# Patient Record
Sex: Female | Born: 1950
Health system: Southern US, Community
[De-identification: ages and names within clinical notes are randomized; demographics above are authoritative.]

## PROBLEM LIST (undated history)

## (undated) DIAGNOSIS — J45909 Unspecified asthma, uncomplicated: Secondary | ICD-10-CM

## (undated) DIAGNOSIS — F1910 Other psychoactive substance abuse, uncomplicated: Secondary | ICD-10-CM

## (undated) DIAGNOSIS — K589 Irritable bowel syndrome without diarrhea: Secondary | ICD-10-CM

## (undated) DIAGNOSIS — I10 Essential (primary) hypertension: Secondary | ICD-10-CM

## (undated) DIAGNOSIS — H269 Unspecified cataract: Secondary | ICD-10-CM

## (undated) DIAGNOSIS — E785 Hyperlipidemia, unspecified: Secondary | ICD-10-CM

## (undated) DIAGNOSIS — M1990 Unspecified osteoarthritis, unspecified site: Secondary | ICD-10-CM

## (undated) DIAGNOSIS — J45991 Cough variant asthma: Secondary | ICD-10-CM

## (undated) DIAGNOSIS — K219 Gastro-esophageal reflux disease without esophagitis: Secondary | ICD-10-CM

## (undated) DIAGNOSIS — F419 Anxiety disorder, unspecified: Secondary | ICD-10-CM

## (undated) DIAGNOSIS — F329 Major depressive disorder, single episode, unspecified: Secondary | ICD-10-CM

## (undated) DIAGNOSIS — F32A Depression, unspecified: Secondary | ICD-10-CM

## (undated) DIAGNOSIS — K449 Diaphragmatic hernia without obstruction or gangrene: Secondary | ICD-10-CM

## (undated) DIAGNOSIS — R011 Cardiac murmur, unspecified: Secondary | ICD-10-CM

## (undated) DIAGNOSIS — C801 Malignant (primary) neoplasm, unspecified: Secondary | ICD-10-CM

## (undated) DIAGNOSIS — T7840XA Allergy, unspecified, initial encounter: Secondary | ICD-10-CM

## (undated) HISTORY — DX: Other psychoactive substance abuse, uncomplicated: F19.10

## (undated) HISTORY — PX: TUBAL LIGATION: SHX77

## (undated) HISTORY — DX: Depression, unspecified: F32.A

## (undated) HISTORY — DX: Diaphragmatic hernia without obstruction or gangrene: K44.9

## (undated) HISTORY — DX: Major depressive disorder, single episode, unspecified: F32.9

## (undated) HISTORY — DX: Anxiety disorder, unspecified: F41.9

## (undated) HISTORY — DX: Unspecified asthma, uncomplicated: J45.909

## (undated) HISTORY — PX: ANKLE SURGERY: SHX546

## (undated) HISTORY — DX: Unspecified osteoarthritis, unspecified site: M19.90

## (undated) HISTORY — DX: Cough variant asthma: J45.991

## (undated) HISTORY — DX: Gastro-esophageal reflux disease without esophagitis: K21.9

## (undated) HISTORY — DX: Irritable bowel syndrome, unspecified: K58.9

## (undated) HISTORY — PX: CATARACT EXTRACTION: SUR2

## (undated) HISTORY — DX: Hyperlipidemia, unspecified: E78.5

## (undated) HISTORY — DX: Unspecified cataract: H26.9

## (undated) HISTORY — DX: Essential (primary) hypertension: I10

## (undated) HISTORY — DX: Malignant (primary) neoplasm, unspecified: C80.1

## (undated) HISTORY — DX: Cardiac murmur, unspecified: R01.1

## (undated) HISTORY — DX: Allergy, unspecified, initial encounter: T78.40XA

---

## 1997-07-30 ENCOUNTER — Other Ambulatory Visit: Admission: RE | Admit: 1997-07-30 | Discharge: 1997-07-30 | Payer: Self-pay | Admitting: Obstetrics and Gynecology

## 1997-12-26 ENCOUNTER — Ambulatory Visit (HOSPITAL_COMMUNITY): Admission: RE | Admit: 1997-12-26 | Discharge: 1997-12-26 | Payer: Self-pay | Admitting: Obstetrics and Gynecology

## 1998-06-07 ENCOUNTER — Other Ambulatory Visit: Admission: RE | Admit: 1998-06-07 | Discharge: 1998-06-07 | Payer: Self-pay | Admitting: Internal Medicine

## 1998-06-13 ENCOUNTER — Encounter: Payer: Self-pay | Admitting: Internal Medicine

## 1998-06-13 ENCOUNTER — Ambulatory Visit (HOSPITAL_COMMUNITY): Admission: RE | Admit: 1998-06-13 | Discharge: 1998-06-13 | Payer: Self-pay | Admitting: Internal Medicine

## 1998-06-26 ENCOUNTER — Encounter: Payer: Self-pay | Admitting: Internal Medicine

## 1998-06-26 ENCOUNTER — Ambulatory Visit (HOSPITAL_COMMUNITY): Admission: RE | Admit: 1998-06-26 | Discharge: 1998-06-26 | Payer: Self-pay | Admitting: Internal Medicine

## 1999-06-02 ENCOUNTER — Encounter: Payer: Self-pay | Admitting: Internal Medicine

## 1999-06-02 ENCOUNTER — Encounter: Admission: RE | Admit: 1999-06-02 | Discharge: 1999-06-02 | Payer: Self-pay | Admitting: Internal Medicine

## 1999-06-09 ENCOUNTER — Inpatient Hospital Stay (HOSPITAL_COMMUNITY): Admission: AD | Admit: 1999-06-09 | Discharge: 1999-06-12 | Payer: Self-pay | Admitting: *Deleted

## 1999-06-13 ENCOUNTER — Other Ambulatory Visit (HOSPITAL_COMMUNITY): Admission: RE | Admit: 1999-06-13 | Discharge: 1999-06-30 | Payer: Self-pay | Admitting: Psychiatry

## 2000-03-15 ENCOUNTER — Ambulatory Visit (HOSPITAL_COMMUNITY): Admission: RE | Admit: 2000-03-15 | Discharge: 2000-03-15 | Payer: Self-pay

## 2000-03-15 ENCOUNTER — Encounter: Payer: Self-pay | Admitting: Internal Medicine

## 2000-06-14 ENCOUNTER — Other Ambulatory Visit: Admission: RE | Admit: 2000-06-14 | Discharge: 2000-06-14 | Payer: Self-pay | Admitting: Internal Medicine

## 2001-03-28 ENCOUNTER — Ambulatory Visit (HOSPITAL_COMMUNITY): Admission: RE | Admit: 2001-03-28 | Discharge: 2001-03-28 | Payer: Self-pay | Admitting: Internal Medicine

## 2001-03-28 ENCOUNTER — Encounter: Payer: Self-pay | Admitting: Internal Medicine

## 2001-04-04 ENCOUNTER — Encounter: Payer: Self-pay | Admitting: Internal Medicine

## 2001-04-04 ENCOUNTER — Encounter: Admission: RE | Admit: 2001-04-04 | Discharge: 2001-04-04 | Payer: Self-pay | Admitting: Internal Medicine

## 2001-06-20 ENCOUNTER — Other Ambulatory Visit: Admission: RE | Admit: 2001-06-20 | Discharge: 2001-06-20 | Payer: Self-pay | Admitting: Internal Medicine

## 2001-09-17 ENCOUNTER — Ambulatory Visit (HOSPITAL_COMMUNITY): Admission: RE | Admit: 2001-09-17 | Discharge: 2001-09-17 | Payer: Self-pay | Admitting: Orthopedic Surgery

## 2001-09-17 ENCOUNTER — Encounter: Payer: Self-pay | Admitting: Orthopedic Surgery

## 2002-05-05 ENCOUNTER — Ambulatory Visit (HOSPITAL_COMMUNITY): Admission: RE | Admit: 2002-05-05 | Discharge: 2002-05-05 | Payer: Self-pay | Admitting: Internal Medicine

## 2002-05-05 ENCOUNTER — Encounter: Payer: Self-pay | Admitting: Internal Medicine

## 2002-07-03 ENCOUNTER — Other Ambulatory Visit: Admission: RE | Admit: 2002-07-03 | Discharge: 2002-07-03 | Payer: Self-pay | Admitting: Internal Medicine

## 2004-04-01 ENCOUNTER — Ambulatory Visit (HOSPITAL_BASED_OUTPATIENT_CLINIC_OR_DEPARTMENT_OTHER): Admission: RE | Admit: 2004-04-01 | Discharge: 2004-04-01 | Payer: Self-pay | Admitting: Orthopedic Surgery

## 2004-07-28 ENCOUNTER — Other Ambulatory Visit: Admission: RE | Admit: 2004-07-28 | Discharge: 2004-07-28 | Payer: Self-pay | Admitting: Internal Medicine

## 2004-10-22 ENCOUNTER — Ambulatory Visit: Payer: Self-pay | Admitting: Gastroenterology

## 2004-11-14 ENCOUNTER — Ambulatory Visit: Payer: Self-pay | Admitting: Gastroenterology

## 2004-11-19 ENCOUNTER — Encounter (INDEPENDENT_AMBULATORY_CARE_PROVIDER_SITE_OTHER): Payer: Self-pay | Admitting: *Deleted

## 2004-11-19 ENCOUNTER — Ambulatory Visit: Payer: Self-pay | Admitting: Gastroenterology

## 2004-12-08 ENCOUNTER — Encounter: Admission: RE | Admit: 2004-12-08 | Discharge: 2005-03-08 | Payer: Self-pay | Admitting: Orthopedic Surgery

## 2005-01-16 ENCOUNTER — Ambulatory Visit: Payer: Self-pay | Admitting: Gastroenterology

## 2005-08-04 ENCOUNTER — Other Ambulatory Visit: Admission: RE | Admit: 2005-08-04 | Discharge: 2005-08-04 | Payer: Self-pay | Admitting: Internal Medicine

## 2006-07-15 ENCOUNTER — Other Ambulatory Visit: Admission: RE | Admit: 2006-07-15 | Discharge: 2006-07-15 | Payer: Self-pay | Admitting: Internal Medicine

## 2007-06-24 ENCOUNTER — Other Ambulatory Visit: Admission: RE | Admit: 2007-06-24 | Discharge: 2007-06-24 | Payer: Self-pay | Admitting: Internal Medicine

## 2007-11-13 ENCOUNTER — Emergency Department (HOSPITAL_BASED_OUTPATIENT_CLINIC_OR_DEPARTMENT_OTHER): Admission: EM | Admit: 2007-11-13 | Discharge: 2007-11-13 | Payer: Self-pay | Admitting: Emergency Medicine

## 2008-03-02 ENCOUNTER — Ambulatory Visit: Payer: Self-pay | Admitting: Internal Medicine

## 2008-03-05 ENCOUNTER — Ambulatory Visit: Payer: Self-pay | Admitting: Internal Medicine

## 2008-03-16 ENCOUNTER — Emergency Department (HOSPITAL_BASED_OUTPATIENT_CLINIC_OR_DEPARTMENT_OTHER): Admission: EM | Admit: 2008-03-16 | Discharge: 2008-03-16 | Payer: Self-pay | Admitting: Emergency Medicine

## 2008-05-22 ENCOUNTER — Ambulatory Visit: Payer: Self-pay | Admitting: Internal Medicine

## 2008-08-28 ENCOUNTER — Encounter: Payer: Self-pay | Admitting: Internal Medicine

## 2008-08-28 ENCOUNTER — Ambulatory Visit: Payer: Self-pay | Admitting: Internal Medicine

## 2008-08-28 ENCOUNTER — Other Ambulatory Visit: Admission: RE | Admit: 2008-08-28 | Discharge: 2008-08-28 | Payer: Self-pay | Admitting: Internal Medicine

## 2008-10-23 DIAGNOSIS — D0920 Carcinoma in situ of unspecified eye: Secondary | ICD-10-CM

## 2008-10-23 HISTORY — DX: Carcinoma in situ of unspecified eye: D09.20

## 2008-11-22 ENCOUNTER — Ambulatory Visit: Payer: Self-pay | Admitting: Internal Medicine

## 2008-11-22 ENCOUNTER — Encounter: Payer: Self-pay | Admitting: Internal Medicine

## 2008-12-10 ENCOUNTER — Ambulatory Visit: Payer: Self-pay | Admitting: Internal Medicine

## 2008-12-24 ENCOUNTER — Telehealth (INDEPENDENT_AMBULATORY_CARE_PROVIDER_SITE_OTHER): Payer: Self-pay | Admitting: *Deleted

## 2008-12-25 ENCOUNTER — Ambulatory Visit: Payer: Self-pay

## 2008-12-25 ENCOUNTER — Encounter: Payer: Self-pay | Admitting: Cardiovascular Disease

## 2009-01-08 ENCOUNTER — Ambulatory Visit: Payer: Self-pay | Admitting: Sports Medicine

## 2009-01-08 DIAGNOSIS — M7660 Achilles tendinitis, unspecified leg: Secondary | ICD-10-CM

## 2009-01-21 ENCOUNTER — Ambulatory Visit: Payer: Self-pay | Admitting: Internal Medicine

## 2009-03-05 ENCOUNTER — Ambulatory Visit: Payer: Self-pay | Admitting: Internal Medicine

## 2009-03-19 ENCOUNTER — Telehealth: Payer: Self-pay | Admitting: Cardiovascular Disease

## 2009-03-26 ENCOUNTER — Ambulatory Visit: Payer: Self-pay | Admitting: Internal Medicine

## 2009-04-19 ENCOUNTER — Encounter: Payer: Self-pay | Admitting: Cardiovascular Disease

## 2009-04-19 ENCOUNTER — Ambulatory Visit (HOSPITAL_BASED_OUTPATIENT_CLINIC_OR_DEPARTMENT_OTHER): Admission: RE | Admit: 2009-04-19 | Discharge: 2009-04-19 | Payer: Self-pay | Admitting: Internal Medicine

## 2009-05-15 ENCOUNTER — Ambulatory Visit: Payer: Self-pay | Admitting: Cardiovascular Disease

## 2009-10-22 ENCOUNTER — Ambulatory Visit: Payer: Self-pay | Admitting: Sports Medicine

## 2009-10-22 DIAGNOSIS — M7750 Other enthesopathy of unspecified foot: Secondary | ICD-10-CM

## 2009-11-08 ENCOUNTER — Ambulatory Visit: Payer: Self-pay | Admitting: Internal Medicine

## 2009-11-10 ENCOUNTER — Emergency Department (HOSPITAL_BASED_OUTPATIENT_CLINIC_OR_DEPARTMENT_OTHER): Admission: EM | Admit: 2009-11-10 | Discharge: 2009-11-10 | Payer: Self-pay | Admitting: Emergency Medicine

## 2009-11-10 ENCOUNTER — Ambulatory Visit: Payer: Self-pay | Admitting: Diagnostic Radiology

## 2009-11-13 ENCOUNTER — Ambulatory Visit (HOSPITAL_BASED_OUTPATIENT_CLINIC_OR_DEPARTMENT_OTHER): Admission: RE | Admit: 2009-11-13 | Discharge: 2009-11-13 | Payer: Self-pay | Admitting: Emergency Medicine

## 2009-11-13 ENCOUNTER — Ambulatory Visit: Payer: Self-pay | Admitting: Radiology

## 2009-11-14 ENCOUNTER — Encounter (INDEPENDENT_AMBULATORY_CARE_PROVIDER_SITE_OTHER): Payer: Self-pay

## 2009-11-19 ENCOUNTER — Ambulatory Visit: Payer: Self-pay | Admitting: Gastroenterology

## 2009-11-21 ENCOUNTER — Telehealth: Payer: Self-pay | Admitting: Gastroenterology

## 2009-11-25 ENCOUNTER — Ambulatory Visit: Payer: Self-pay | Admitting: Internal Medicine

## 2009-12-10 ENCOUNTER — Ambulatory Visit: Payer: Self-pay | Admitting: Family Medicine

## 2009-12-10 ENCOUNTER — Ambulatory Visit (HOSPITAL_BASED_OUTPATIENT_CLINIC_OR_DEPARTMENT_OTHER): Admission: RE | Admit: 2009-12-10 | Discharge: 2009-12-10 | Payer: Self-pay | Admitting: Family Medicine

## 2009-12-10 ENCOUNTER — Ambulatory Visit: Payer: Self-pay | Admitting: Diagnostic Radiology

## 2009-12-13 ENCOUNTER — Encounter: Admission: RE | Admit: 2009-12-13 | Discharge: 2009-12-13 | Payer: Self-pay | Admitting: Internal Medicine

## 2009-12-26 ENCOUNTER — Ambulatory Visit: Payer: Self-pay | Admitting: Family Medicine

## 2009-12-30 ENCOUNTER — Ambulatory Visit: Payer: Self-pay | Admitting: Internal Medicine

## 2010-01-21 ENCOUNTER — Ambulatory Visit: Payer: Self-pay | Admitting: Internal Medicine

## 2010-02-05 ENCOUNTER — Telehealth: Payer: Self-pay | Admitting: Gastroenterology

## 2010-02-06 ENCOUNTER — Encounter (INDEPENDENT_AMBULATORY_CARE_PROVIDER_SITE_OTHER): Payer: Self-pay | Admitting: *Deleted

## 2010-02-19 ENCOUNTER — Ambulatory Visit: Payer: Self-pay | Admitting: Gastroenterology

## 2010-02-19 ENCOUNTER — Telehealth: Payer: Self-pay | Admitting: Gastroenterology

## 2010-02-19 ENCOUNTER — Encounter (INDEPENDENT_AMBULATORY_CARE_PROVIDER_SITE_OTHER): Payer: Self-pay | Admitting: *Deleted

## 2010-02-21 ENCOUNTER — Ambulatory Visit: Payer: Self-pay | Admitting: Internal Medicine

## 2010-03-05 ENCOUNTER — Ambulatory Visit: Payer: Self-pay | Admitting: Gastroenterology

## 2010-03-09 ENCOUNTER — Encounter: Payer: Self-pay | Admitting: Gastroenterology

## 2010-03-14 ENCOUNTER — Ambulatory Visit: Payer: Self-pay | Admitting: Internal Medicine

## 2010-05-05 ENCOUNTER — Emergency Department (HOSPITAL_BASED_OUTPATIENT_CLINIC_OR_DEPARTMENT_OTHER)
Admission: EM | Admit: 2010-05-05 | Discharge: 2010-05-05 | Payer: Self-pay | Source: Home / Self Care | Admitting: Emergency Medicine

## 2010-06-12 NOTE — Letter (Signed)
Summary: [facility] Instructions  [facility]  [address] [place], [place] [address]   Phone: [phone]  Fax: [phone]       Donna [name]    05/27/1950    MRN: [id number]        Procedure Day Dorna Bloom:  Wednesday 12/04/2009     Arrival Time: 8:00 am      Procedure Time: 9:00 am     Location of Procedure:                    _ x_  [facility] (4th Floor)                        PREPARATION FOR COLONOSCOPY WITH MOVIPREP   Starting 5 days prior to your procedure Friday 7/22 do not eat nuts, seeds, popcorn, corn, beans, peas,  salads, or any raw vegetables.  Do not take any fiber supplements (e.g. Metamucil, Citrucel, and Benefiber).  THE DAY BEFORE YOUR PROCEDURE         DATE: Tuesday 7/26 1.  Drink clear liquids the entire day-NO SOLID FOOD  2.  Do not drink anything colored red or purple.  Avoid juices with pulp.  No orange juice.  3.  Drink at least 64 oz. (8 glasses) of fluid/clear liquids during the day to prevent dehydration and help the prep work efficiently.  CLEAR LIQUIDS INCLUDE: Water Jello Ice Popsicles Tea (sugar ok, no milk/cream) Powdered fruit flavored drinks Coffee (sugar ok, no milk/cream) Gatorade Juice: apple, white grape, white cranberry  Lemonade Clear bullion, consomm, broth Carbonated beverages (any kind) Strained chicken noodle soup Hard Candy                             4.  In the morning, mix first dose of MoviPrep solution:    Empty 1 Pouch A and 1 Pouch B into the disposable container    Add lukewarm drinking water to the top line of the container. Mix to dissolve    Refrigerate (mixed solution should be used within 24 hrs)  5.  Begin drinking the prep at 5:00 p.m. The MoviPrep container is divided by 4 marks.   Every 15 minutes drink the solution down to the next mark (approximately 8 oz) until the full liter is complete.   6.  Follow completed prep with 16 oz of clear liquid of your choice (Nothing  red or purple).  Continue to drink clear liquids until bedtime.  7.  Before going to bed, mix second dose of MoviPrep solution:    Empty 1 Pouch A and 1 Pouch B into the disposable container    Add lukewarm drinking water to the top line of the container. Mix to dissolve    Refrigerate  THE DAY OF YOUR PROCEDURE      DATE: Wednesday 7/27  Beginning at 4:00 a.m. (5 hours before procedure):         1. Every 15 minutes, drink the solution down to the next mark (approx 8 oz) until the full liter is complete.  2. Follow completed prep with 16 oz. of clear liquid of your choice.    3. You may drink clear liquids until 7:00 am (2 HOURS BEFORE PROCEDURE).   MEDICATION INSTRUCTIONS  Unless otherwise instructed, you should take regular prescription medications with a small sip of water   as early as possible the morning of your  procedure.         OTHER INSTRUCTIONS  You will need a responsible adult at least 59 years of age to accompany you and drive you home.   This person must remain in the waiting room during your procedure.  Wear loose fitting clothing that is easily removed.  Leave jewelry and other valuables at home.  However, you may wish to bring a book to read or  an iPod/MP3 player to listen to music as you wait for your procedure to start.  Remove all body piercing jewelry and leave at home.  Total time from sign-in until discharge is approximately 2-3 hours.  You should go home directly after your procedure and rest.  You can resume normal activities the  day after your procedure.  The day of your procedure you should not:   Drive   Make legal decisions   Operate machinery   Drink alcohol   Return to work  You will receive specific instructions about eating, activities and medications before you leave.    The above instructions have been reviewed and explained to me by   Ulis Rias RN  November 19, 2009 2:18 PM     I fully understand and can  verbalize these instructions _____________________________ Date _________

## 2010-06-12 NOTE — Progress Notes (Signed)
Summary: Nuclear Pre-Procedure  Phone Note Outgoing Call Call back at [facility] Phone [phone]   Call placed by: Stanton Kidney, EMT-P,  December 24, 2008 12:44 PM Action Taken: Phone Call Completed Summary of Call: Left message with information on Myoview Information Sheet (see scanned document for details).     Nuclear Med Background Indications for Stress Test: Evaluation for Ischemia   History: Myocardial Perfusion Study  History Comments: '02 MPS: (-) ischemia, EF=58%  Symptoms: Chest Pain with Exertion, Chest Tightness with Exertion, DOE    Nuclear Pre-Procedure Cardiac Risk Factors: Family History - CAD, History of Smoking, Lipids Height (in): 67

## 2010-06-12 NOTE — Letter (Signed)
Summary: Patient Notice- Polyp Results   Gastroenterology  [address] [place], [place] [address]   Phone: [phone]  Fax: [phone]        March 09, 2010 MRN: [id number]    [facility] [address] [place], [place]  [address]    Dear Ms. Donna [name],  I am pleased to inform you that the colon polyp(s) removed during your recent colonoscopy was (were) found to be benign (no cancer detected) upon pathologic examination.  I recommend you have a repeat colonoscopy examination in 5 years to look for recurrent polyps, as having colon polyps increases your risk for having recurrent polyps or even colon cancer in the future.  The gastric biopsies were normal.  Should you develop new or worsening symptoms of abdominal pain, bowel habit changes or bleeding from the rectum or bowels, please schedule an evaluation with either your primary care physician or with me.  Continue treatment plan as outlined the day of your exam.  Please call us if you are having persistent problems or have questions about your condition that have not been fully answered at this time.  Sincerely,  Meryl Dare MD [facility]  This letter has been electronically signed by your physician.  Appended Document: Patient Notice- Polyp Results letter mailed

## 2010-06-12 NOTE — Miscellaneous (Signed)
Summary: [facility] previsit  Clinical Lists Changes  Medications: Added new medication of MOVIPREP 100 GM  SOLR (PEG-KCL-NACL-NASULF-NA ASC-C) As per prep instructions. - Signed Rx of MOVIPREP 100 GM  SOLR (PEG-KCL-NACL-NASULF-NA ASC-C) As per prep instructions.;  #1 x 0;  Signed;  Entered by: Ulis Rias RN;  Authorized by: Meryl Dare MD [facility];  Method used: Faxed to [id number], [address] [place], [place]  [address], Ph: [phone], Fax: [phone]    Prescriptions: MOVIPREP 100 GM  SOLR (PEG-KCL-NACL-NASULF-NA ASC-C) As per prep instructions.  #1 x 0   Entered by:   Ulis Rias RN   Authorized by:   Meryl Dare MD [facility]   Signed by:   Ulis Rias RN on 11/19/2009   Method used:   Faxed to ...       [id number] (retail)       [address]       [place], [place]  [address]       Ph: [phone]       Fax: [phone]   RxID:   [id number]

## 2010-06-12 NOTE — Miscellaneous (Signed)
Summary: [facility] Previsit/prep  Clinical Lists Changes  Medications: Added new medication of DULCOLAX 5 MG  TBEC (BISACODYL) Day before procedure take 2 at 3pm and 2 at 8pm. - Signed Added new medication of METOCLOPRAMIDE HCL 10 MG  TABS (METOCLOPRAMIDE HCL) As per prep instructions. - Signed Added new medication of MIRALAX   POWD (POLYETHYLENE GLYCOL 3350) As per prep  instructions. - Signed Rx of DULCOLAX 5 MG  TBEC (BISACODYL) Day before procedure take 2 at 3pm and 2 at 8pm.;  #4 x 0;  Signed;  Entered by: Wyona Almas RN;  Authorized by: Meryl Dare MD [facility];  Method used: Electronically to [facility]*, [address] Shipping/mailing, [place], [place]  [address], Ph: [phone], Fax: [phone] Rx of METOCLOPRAMIDE HCL 10 MG  TABS (METOCLOPRAMIDE HCL) As per prep instructions.;  #2 x 0;  Signed;  Entered by: Wyona Almas RN;  Authorized by: Meryl Dare MD [facility];  Method used: Electronically to [facility]*, [address], [address] Shipping/mailing, [place], [place]  [address], Ph: [phone], Fax: [phone] Rx of MIRALAX   POWD (POLYETHYLENE GLYCOL 3350) As per prep  instructions.;  #255gm x 0;  Signed;  Entered by: Wyona Almas RN;  Authorized by: Meryl Dare MD [facility];  Method used: Electronically to [facility]*, [address], [address] Shipping/mailing, [place], [place]  [address], Ph: [phone], Fax: [phone] Allergies: Added new allergy or adverse reaction of * MOVI PREP    Prescriptions: MIRALAX   POWD (POLYETHYLENE GLYCOL 3350) As per prep  instructions.  #255gm x 0   Entered by:   Wyona Almas RN   Authorized by:   Meryl Dare MD [facility]   Signed by:   Wyona Almas RN on 02/19/2010   Method used:   Electronically to        [facility]* (retail)       [address]       [address] [place] [address]mailing       [place], [place]  [address]       Ph:  [phone]       Fax: [phone]   RxID:   [id number] METOCLOPRAMIDE HCL 10 MG  TABS (METOCLOPRAMIDE HCL) As per prep instructions.  #2 x 0   Entered by:   Wyona Almas RN   Authorized by:   Meryl Dare MD [facility]   Signed by:   Wyona Almas RN on 02/19/2010   Method used:   Electronically to        [facility]* (retail)       [address]       [address] [place] [address]       [place], [place]  [address]       Ph: [phone]       Fax: [phone]   RxID:   [id number] DULCOLAX 5 MG  TBEC (BISACODYL) Day before procedure take 2 at 3pm and 2 at 8pm.  #4 x 0   Entered by:   Wyona Almas RN   Authorized by:   Meryl Dare MD [facility]   Signed by:   Wyona Almas RN on 02/19/2010   Method used:   Electronically to        [facility]* (retail)       [address]       [address]  [address]/mail[address]       [place], [place]  [address]       Ph: [phone]       Fax: [phone]   RxID:   [id number]

## 2010-06-12 NOTE — Assessment & Plan Note (Signed)
Summary: EC3/ PREVIOUS PT OF DR. ROSS/ GD   Visit Type:  Initial Consult Primary Provider:  Eden Emms [name]  CC:  occ sob and chest pain with exer.Marland Kitchen  History of Present Illness: Donna [name] is seen today for the first time by me.  She has seen Dr Tenny Craw in the past for SSCP.  She has a normal myovue and echo last year.  Her CRF's are limited.  Her LDL on 08/2008 was 118.  She works out at [employer] in [place] 4x/week.  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  She has some exertional dyspnea which sounds functional  There is no cough, sputum or edema.  She is a nonsmoker.  We discussed the possibility of a calcium score in the future if she wanted to further define her risk of CAD.  Her primary care doctor is [name].  I told her since her tests were normal and she is now asymptomatic that she could see me as needed  I reviewed her sleep study with her.  She had no periods of prolonged apnea and never dropped her sats below 90.    I take care of her husband Donna Maduro which is why she F/U with me and not Dr Tenny Craw.  Current Problems (verified): 1)  Achilles Tendinitis  (ICD-726.71) 2)  Chest Pain  (ICD-786.50)  Current Medications (verified): 1)  Multivitamins  Caps (Multiple Vitamin) .... Take 1 Capsule By Mouth Once A Day 2)  Calcium Gluconate 500 Mg Tabs (Calcium Gluconate) .... Take 1 Tablet By Mouth Once A Day 3)  Vitamin D 3 5,000 Iu Tablet .... Take 1 Tablet By Mouth Once A Day 4)  Magnesium 500 Mg Tabs (Magnesium) .... Take 1 Tablet By Mouth Once A Day 5)  B Complex  Tabs (B Complex Vitamins) .... Take 1 Tablet By Mouth Once A Day 6)  Resveratral 125mg  Cap .... Take 1 Capsule By Mouth Once A Day 7)  Omega-3 Fish Oil 1200 Mg Caps (Omega-3 Fatty Acids) .... Take 1 Capsule By Mouth Once A Day 8)  Flax Seed Oil 1000 Mg Caps (Flaxseed (Linseed)) .... Take 1 Capsule By Mouth Once A Day 9)  Zyflamend Capsule .... Take 1 Capsule By Mouth Once A Day 10)  Nitroglycerin 0.4 Mg  Subl (Nitroglycerin) .... One Tablet Under Tongue Every 5 Minutes As Needed For Chest Pain---May Repeat Times Three 11)  Aspirin 81 Mg Tbec (Aspirin) .... Take One Tablet By Mouth Daily 12)  Voltaren 1 % Gel (Diclofenac Sodium) .Marland Kitchen.. 1 Gram Qid  Allergies (verified): 1)  ! * Quinolones  Past History:  Past Medical History: Last updated: 12/08/2008 Adenomatous colon polyups, Gerd,irritable bowwel syndrome,hyperlidemia, history of coronary artery diease,family history of colon cancer - pt has had history of chest pain  Past Surgical History: Last updated: 12/08/2008 2 ankle surgeries  Family History: Last updated: 12/08/2008 father heart diease- mother ovarian cancer and DM  Social History: Last updated: 12/10/2008 married - no smoking -one glass of wine per night- quit tobacco in 2002 after a 30 pack year. Remote recreational drug use.  Review of Systems       Denies fever, malais, weight loss, blurry vision, decreased visual acuity, cough, sputum, SOB, hemoptysis, pleuritic pain, palpitaitons, heartburn, abdominal pain, melena, lower extremity edema, claudication, or rash. All other systems reviewed and negative  Vital Signs:  Patient profile:   60 year old female Height:      67 inches Weight:  162 pounds BMI:     25.46 Pulse rate:   57 / minute BP sitting:   123 / 68  (left arm) Cuff size:   regular  Vitals Entered By: Burnett Kanaris, CNA (May 15, 2009 9:23 AM)  Physical Exam  General:  Affect appropriate Healthy:  appears stated age HEENT: normal Neck supple with no adenopathy JVP normal no bruits no thyromegaly Lungs clear with no wheezing and good diaphragmatic motion Heart:  S1/S2 no murmur,rub, gallop or click PMI normal Abdomen: benighn, BS positve, no tenderness, no AAA no bruit.  No HSM or HJR Distal pulses intact with no bruits No edema Neuro non-focal Skin warm and dry    Impression & Recommendations:  Problem # 1:  CHEST PAIN  (ICD-786.50) Normal myovue, echo and ECG no need for further w.u. Consider Ca Score  in future to further define risk of develop. CAD Her updated medication list for this problem includes:    Nitroglycerin 0.4 Mg Subl (Nitroglycerin) ..... One tablet under tongue every 5 minutes as needed for chest pain---may repeat times three    Aspirin 81 Mg Tbec (Aspirin) .Marland Kitchen... Take one tablet by mouth daily  Problem # 2:  DYSPNEA (ICD-786.05) Funcitonal. Normal RV and LV function on echo.  Sleep study doesn't warrant CPAP in my view.  F.U [name] Her updated medication list for this problem includes:    Aspirin 81 Mg Tbec (Aspirin) .Marland Kitchen... Take one tablet by mouth daily  Patient Instructions: 1)  Your physician recommends that you schedule a follow-up appointment in: AS NEEDED 2)  Your physician recommends that you continue on your current medications as directed. Please refer to the Current Medication list given to you today.

## 2010-06-12 NOTE — Progress Notes (Signed)
Summary: Triage  Phone Note Call from Patient Call back at Home Phone [phone]   Caller: Patient Call For: Dr. Russella Dar Complaint: Breathing Problems Summary of Call: pt. has been vomiting and nauseated the last 4 days. Took a diuretic yesterday and very sick. Would like to discuss about her procedure today Initial call taken by: Karna Christmas,  February 05, 2010 8:10 AM  Follow-up for Phone Call        Returned pts. phone call.  She has had nausea and vomiting for the past 4 days.  Was able to take the first part of her prep last night but has been vomiting all morning. She states that her stools are somewhat liquid with some solid.  She is wondering whether to have her procedure today and if so what to do about taking the second half of the prep.  Follow-up by: Jennye Boroughs RN,  February 05, 2010 8:26 AM  Additional Follow-up for Phone Call Additional follow up Details #1::        Since she has been sick and vomiting her prep we should reschedule her procedure and she needs to see her PCP to evaluate her illness or work in with our extender. Additional Follow-up by: Meryl Dare MD Clementeen Graham,  February 05, 2010 9:37 AM    Additional Follow-up for Phone Call Additional follow up Details #2::    Spoke with pt. and told her that Dr Russella Dar feels that it would be best to cancel her procedure.  She will call back at a later time to reschedule. Advised her to see her primary care md for evaluation. Follow-up by: Jennye Boroughs RN,  February 05, 2010 9:53 AM

## 2010-06-12 NOTE — Progress Notes (Signed)
Summary: Triage  Phone Note Call from Patient Call back at [phone]   Caller: Patient Call For: Dr. Russella Dar Summary of Call: pt. said she was in for her previsit and discussed having an endo....would like to discuss Initial call taken by: Karna Christmas,  November 21, 2009 10:47 AM  Follow-up for Phone Call        Attempted to reach pt; unable to.  Left message on answering machine  Additional Follow-up for Phone Call Additional follow up Details #1::        Attempted to reach pt, and left msg on machine to call us back with my name.  Pt has reschedule double for 02/05/10 at 2:30pm with Dr. Russella Dar Additional Follow-up by: Clide Cliff RN,  November 21, 2009 12:42 PM

## 2010-06-12 NOTE — Assessment & Plan Note (Signed)
Summary: LEFT LEG CALF PAIN/KH   Vital Signs:  Patient Profile:   60 Years Old Female CC:      Left calf pain x 5 days Height:     67 inches Weight:      155 pounds O2 Sat:      98 % O2 treatment:    Room Air Temp:     97.3 degrees F oral Pulse rate:   64 / minute Pulse rhythm:   regular Resp:     12 per minute BP sitting:   163 / 91  (right arm) Cuff size:   regular  Vitals Entered By: Emilio Math (December 10, 2009 1:09 PM)                  Current Allergies (reviewed today): ! Hewitt Shorts of Present Illness Chief Complaint: Left calf pain x 5 days History of Present Illness:  Subjective:  Patient complains of breaking her right 4th toe one week ago.  Since then she has been favoring her right foot and walking with a limp.  Yesterday she developed a dull ache in her left posterior calf.  No chest pain or shortness of breath   Current Meds MULTIVITAMINS  CAPS (MULTIPLE VITAMIN) Take 1 capsule by mouth once a day CALCIUM GLUCONATE 500 MG TABS (CALCIUM GLUCONATE) Take 1 tablet by mouth once a day * VITAMIN D 3 5,000 IU TABLET Take 1 tablet by mouth once a day MAGNESIUM 500 MG TABS (MAGNESIUM) Take 1 tablet by mouth once a day B COMPLEX  TABS (B COMPLEX VITAMINS) Take 1 tablet by mouth once a day * RESVERATRAL 125MG  CAP Take 1 capsule by mouth once a day OMEGA-3 FISH OIL 1200 MG CAPS (OMEGA-3 FATTY ACIDS) Take 1 capsule by mouth once a day FLAX SEED OIL 1000 MG CAPS (FLAXSEED (LINSEED)) Take 1 capsule by mouth once a day * ZYFLAMEND CAPSULE Take 1 capsule by mouth once a day MOVIPREP 100 GM  SOLR (PEG-KCL-NACL-NASULF-NA ASC-C) As per prep instructions.  REVIEW OF SYSTEMS Constitutional Symptoms      Denies fever, chills, night sweats, weight loss, weight gain, and fatigue.  Eyes       Denies change in vision, eye pain, eye discharge, glasses, contact lenses, and eye surgery. Ear/Nose/Throat/Mouth       Denies hearing loss/aids, change in hearing, ear pain, ear  discharge, dizziness, frequent runny nose, frequent nose bleeds, sinus problems, sore throat, hoarseness, and tooth pain or bleeding.  Respiratory       Denies dry cough, productive cough, wheezing, shortness of breath, asthma, bronchitis, and emphysema/COPD.  Cardiovascular       Denies murmurs, chest pain, and tires easily with exhertion.    Gastrointestinal       Denies stomach pain, nausea/vomiting, diarrhea, constipation, blood in bowel movements, and indigestion. Genitourniary       Denies painful urination, kidney stones, and loss of urinary control. Neurological       Denies paralysis, seizures, and fainting/blackouts. Musculoskeletal       Complains of muscle pain and joint pain.      Denies joint stiffness, decreased range of motion, redness, swelling, muscle weakness, and gout.  Skin       Denies bruising, unusual mles/lumps or sores, and hair/skin or nail changes.  Psych       Denies mood changes, temper/anger issues, anxiety/stress, speech problems, depression, and sleep problems.  Past History:  Past Medical History: Reviewed history from 12/08/2008 and no changes  required. Adenomatous colon polyups, Gerd,irritable bowwel syndrome,hyperlidemia, history of coronary artery diease,family history of colon cancer - pt has had history of chest pain  Past Surgical History: Reviewed history from 12/08/2008 and no changes required. 2 ankle surgeries  Family History: Reviewed history from 12/08/2008 and no changes required. father heart diease- mother ovarian cancer and DM  Social History: Reviewed history from 12/10/2008 and no changes required. married - no smoking -one glass of wine per night- quit tobacco in 2002 after a 30 pack year. Remote recreational drug use.   Objective:  Appearance:  Patient appears healthy, stated age, and in no acute distress  Left lower leg:  No edema, swelling, erythema, or warmth.  Distinct tenderness in mid-posterior calf.  Homan's test  negative. Duplex scan left lower leg:  Negtive for DVT Assessment New Problems: CALF PAIN, LEFT (ICD-729.5)  SUSPECT INFLAMMATION FROM AN ABNORMAL GAIT AFTER INJURING HER RIGHT FOOT.  DVT RULED OUT  Plan New Orders: LE Venous Duplex (DVT) [DVT] New Patient Level III [99203] Planning Comments:    Begin NSAID and application of ice pack several times per day.  Stretching exercises.   The patient and/or caregiver has been counseled thoroughly with regard to medications prescribed including dosage, schedule, interactions, rationale for use, and possible side effects and they verbalize understanding.  Diagnoses and expected course of recovery discussed and will return if not improved as expected or if the condition worsens. Patient and/or caregiver verbalized understanding.   Orders Added: 1)  LE Venous Duplex (DVT) [DVT] 2)  New Patient Level III [08657]

## 2010-06-12 NOTE — Procedures (Signed)
Summary: Upper Endoscopy  Patient: Donna [name] Note: All result statuses are Final unless otherwise noted.  Tests: (1) Upper Endoscopy (EGD)   EGD Upper Endoscopy       DONE     [facility]     [address]     [place], [place]  [address]           ENDOSCOPY PROCEDURE REPORT           PATIENT:  [name], [name]  MR#:  [id number]     BIRTHDATE:  09-11-1950, 59 yrs. old  GENDER:  female     ENDOSCOPIST:  Judie Petit T. Russella Dar, MD, [facility]           PROCEDURE DATE:  03/05/2010     PROCEDURE:  EGD with biopsy, 6[address]52     ASA CLASS:  Class II     INDICATIONS:  GERD, nausea and vomiting     MEDICATIONS:  There was residual sedation effect present from     prior procedure, Versed 1 mg IV     TOPICAL ANESTHETIC:  Exactacain Spray     DESCRIPTION OF PROCEDURE:   After the risks benefits and     alternatives of the procedure were thoroughly explained, informed     consent was obtained.  The LB [id number]-H180 [id number] endoscope was     introduced through the mouth and advanced to the second portion of     the duodenum, without limitations.  The instrument was slowly     withdrawn as the mucosa was fully examined.     <<PROCEDUREIMAGES>>     Esophagitis was found in the distal esophagus. It was erosive and     linear. LA Classification Grade A. Mild gastritis was found in the     antrum. It was erosive, erythematous and linear.  The duodenal     bulb was normal in appearance, as was the postbulbar duodenum.     Otherwise the examination was normal.  Retroflexed views revealed     no abnormalities.  The scope was then withdrawn from the patient     and the procedure completed.           COMPLICATIONS:  None           ENDOSCOPIC IMPRESSION:     1) Erosive esophagitis     2) Erosive gastritis in the antrum           RECOMMENDATIONS:     1) Anti-reflux regimen long term     2) PPI qam long term: omeprazole 40mg  po qam, #30, 11 refills     3) Await pathology results           Alexy Bringle T.  Russella Dar, MD, [facility]           CC:  Sharlet Salina, MD           n.     Rosalie DoctorVenita Lick. Keyion Knack at 03/05/2010 02:32 PM           Lebron Conners, [id number]  Note: An exclamation mark (!) indicates a result that was not dispersed into the flowsheet. Document Creation Date: 03/05/2010 2:32 PM _______________________________________________________________________  (1) Order result status: Final Collection or observation date-time: 03/05/2010 14:28 Requested date-time:  Receipt date-time:  Reported date-time:  Referring Physician:   Ordering Physician: Claudette Head [phone]) Specimen Source:  Source: Launa Grill Order Number: [id number] Lab site:

## 2010-06-12 NOTE — Procedures (Signed)
Summary: Colonoscopy  Patient: Donna [name] Note: All result statuses are Final unless otherwise noted.  Tests: (1) Colonoscopy (COL)   COL Colonoscopy           DONE     [facility]     [address]     [place], [place]  [address]           COLONOSCOPY PROCEDURE REPORT           PATIENT:  Donna [name], Donna [name]  MR#:  [id number]     BIRTHDATE:  Apr 24, 1951, 59 yrs. old  GENDER:  female     ENDOSCOPIST:  Judie Petit T. Russella Dar, MD, [facility]           PROCEDURE DATE:  03/05/2010     PROCEDURE:  Colonoscopy with snare polypectomy     ASA CLASS:  Class II     INDICATIONS:  1) surveillance and high-risk screening  2) history     of adenomatous colon polyps, 1997  3) family history of colon     cancer: parent at 57, GP at 62 and P aunt at 38.     MEDICATIONS:   Fentanyl 75 mcg IV, Versed 7 mg IV     DESCRIPTION OF PROCEDURE:   After the risks benefits and     alternatives of the procedure were thoroughly explained, informed     consent was obtained.  Digital rectal exam was performed and     revealed no abnormalities.   The LB [id number] endoscope     was introduced through the anus and advanced to the cecum, which     was identified by both the appendix and ileocecal valve, without     limitations.  The quality of the prep was good, using MiraLax.     The instrument was then slowly withdrawn as the colon was fully     examined.     <<PROCEDUREIMAGES>>     FINDINGS:  Scattered diverticula were found in the transverse     colon.  A sessile polyp was found in the sigmoid colon. It was 6     mm in size. Polyp was snared without cautery. Retrieval was     successful. A normal appearing cecum, ileocecal valve, and     appendiceal orifice were identified. The ascending, hepatic     flexure, splenic flexure, descending colon, and rectum appeared     unremarkable. Retroflexed views in the rectum revealed no     abnormalities.  The time to cecum =  2.75  minutes. The scope was     then  withdrawn (time =  9.75  min) from the patient and the     procedure completed.           COMPLICATIONS:  None           ENDOSCOPIC IMPRESSION:     1) Diverticula, scattered in the transverse colon     2) 6 mm sessile polyp in the sigmoid colon           RECOMMENDATIONS:     1) Await pathology results     2) High fiber diet with liberal fluid intake.     3) Repeat Colonoscopy in 5 years pending pathology review.           Venita Lick. Russella Dar, MD, [facility]           CC: Sharlet Salina, MD           n.  eSIGNED:   [place] T. Shannon Balthazar at 03/05/2010 02:24 PM           Lebron Conners, [id number]  Note: An exclamation mark (!) indicates a result that was not dispersed into the flowsheet. Document Creation Date: 03/05/2010 2:25 PM _______________________________________________________________________  (1) Order result status: Final Collection or observation date-time: 03/05/2010 14:19 Requested date-time:  Receipt date-time:  Reported date-time:  Referring Physician:   Ordering Physician: Claudette Head [id number]) Specimen Source:  Source: Launa Grill Order Number: [id number] Lab site:   Appended Document: Colonoscopy     Procedures Next Due Date:    Colonoscopy: 02/2015

## 2010-06-12 NOTE — Progress Notes (Signed)
Summary: pt wants to change MD's  Phone Note Call from Patient Call back at Home Phone [phone] Call back at work number [phone]   Caller: Patient Reason for Call: Talk to Nurse, Talk to Doctor Summary of Call: pt wants to switch MD's and see Dr. Eden Emms because that is who her husband see's Initial call taken by: Omer Jack,  March 19, 2009 3:09 PM  Follow-up for Phone Call        Will send message to Dr Tenny Craw for her okay before changing MD. Julieta Gutting, RN, BSN  March 19, 2009 3:29 PM  Additional Follow-up for Phone Call Additional follow up Details #1::        OK with Dr.Ross to have Dr. Eden Emms follow patient. Will check with Dr. Eden Emms and make sure he is OK with this. Additional Follow-up by: Suzan Garibaldi RN    Additional Follow-up for Phone Call Additional follow up Details #2::    That's fine I know her husband well.  I can see her for routine 6 month f/u or sooner if she likes Follow-up by: Colon Branch, MD, [facility],  March 28, 2009 2:52 PM   Appended Document: pt wants to change MD's Called pt's home LM with her husband and advised OK to set up with Dr.Chasitty Hehl and cancelled appointment with Dr.Ross for 04/2009

## 2010-06-12 NOTE — Letter (Signed)
Summary: Pre Visit Letter Revised  [facility]  [address] [place], [place] [address]   Phone: [phone]  Fax: [phone]        02/06/2010 MRN: [id number] [facility] [address] [place], [place]  [address]             Procedure Date:  03/05/2010   Welcome to the Gastroenterology Division at [facility].    You are scheduled to see a nurse for your pre-procedure visit on 02/19/2010 at 8:00AM on the 3rd floor at [facility], [address]  We ask that you try to arrive at our office 15 minutes prior to your appointment time to allow for check-in.  Please take a minute to review the attached form.  If you answer "Yes" to one or more of the questions on the first page, we ask that you call the person listed at your earliest opportunity.  If you answer "No" to all of the questions, please complete the rest of the form and bring it to your appointment.    Your nurse visit will consist of discussing your medical and surgical history, your immediate family medical history, and your medications.   If you are unable to list all of your medications on the form, please bring the medication bottles to your appointment and we will list them.  We will need to be aware of both prescribed and over the counter drugs.  We will need to know exact dosage information as well.    Please be prepared to read and sign documents such as consent forms, a financial agreement, and acknowledgement forms.  If necessary, and with your consent, a friend or relative is welcome to sit-in on the nurse visit with you.  Please bring your insurance card so that we may make a copy of it.  If your insurance requires a referral to see a specialist, please bring your referral form from your primary care physician.  No co-pay is required for this nurse visit.     If you cannot keep your appointment, please call [phone] to cancel or reschedule prior to your appointment date.  This  allows [place] the opportunity to schedule an appointment for another patient in need of care.    Thank you for choosing [facility] for your medical needs.  We appreciate the opportunity to care for you.  Please visit [place] at our website  to learn more about our practice.  Sincerely, The Gastroenterology Division

## 2010-06-12 NOTE — Progress Notes (Signed)
Summary: Miralax prep  Phone Note Other Incoming   Caller: Pt. here for Previsit Summary of Call: Dr Russella Dar,  I gave Ms. Horsch the Miralax prep with instructions to start it at 4:00am with the first Dulcolax, Reglan at 5:30am, Miralax at 6:00am and the last Dulcolax at 9:00am.  That way she should be prepped well for her colon.  Last time she had the Movi Prep and became extremely ill with Nausea and vomiting.  She called our on call Doctor and he said it was rare but that she was probably allergic to the [facility] prep. Initial call taken by: Wyona Almas RN,  February 19, 2010 12:53 PM  Follow-up for Phone Call        OK for Miralax Follow-up by: Meryl Dare MD [facility],  February 19, 2010 1:26 PM

## 2010-06-12 NOTE — Assessment & Plan Note (Signed)
Summary: HA & High BP rm 4   Vital Signs:  Patient Profile:   60 Years Old Female CC:      HA, BP up Height:     67 inches Weight:      157 pounds O2 Sat:      100 % O2 treatment:    Room Air Temp:     98.1 degrees F oral Pulse rate:   68 / minute Pulse rhythm:   regular Resp:     18 per minute BP sitting:   192 / 103  (left arm) Cuff size:   regular  Vitals Entered By: Areta Haber CMA (December 26, 2009 6:07 PM)                  Current Allergies: ! * QUINOLONES  History of Present Illness Chief Complaint: HA, BP up History of Present Illness:  Subjective:  Patient complains of onset of headache yesterday after eating [place] food for three evenings in a row.  She then noted that her blood pressure was elevated and resumed taking HCTZ 25mg  once daily that she had been prescribed in the past when her BP became elevated after eating [place] food.  She states that her BP in usually normal. She has a history of occasional similar frontal headaches that radiate to her occipital area.  No neuro symptoms. She  has a family history of hypertension  Current Problems: HEADACHE (ICD-784.0) ELEVATED BLOOD PRESSURE WITHOUT DIAGNOSIS OF HYPERTENSION (ICD-796.2) CALF PAIN, LEFT (ICD-729.5) METATARSALGIA (ICD-726.70) PAIN IN SOFT TISSUES OF LIMB (ICD-729.5) DYSPNEA (ICD-786.05) ACHILLES TENDINITIS (ICD-726.71) CHEST PAIN (ICD-786.50)   Current Meds MULTIVITAMINS  CAPS (MULTIPLE VITAMIN) Take 1 capsule by mouth once a day CALCIUM GLUCONATE 500 MG TABS (CALCIUM GLUCONATE) Take 1 tablet by mouth once a day * VITAMIN D 3 5,000 IU TABLET Take 1 tablet by mouth once a day MAGNESIUM 500 MG TABS (MAGNESIUM) Take 1 tablet by mouth once a day B COMPLEX  TABS (B COMPLEX VITAMINS) Take 1 tablet by mouth once a day * RESVERATRAL 125MG  CAP Take 1 capsule by mouth once a day OMEGA-3 FISH OIL 1200 MG CAPS (OMEGA-3 FATTY ACIDS) Take 1 capsule by mouth once a day FLAX SEED OIL 1000 MG CAPS  (FLAXSEED (LINSEED)) Take 1 capsule by mouth once a day * ZYFLAMEND CAPSULE Take 1 capsule by mouth once a day ZOLOFT 25 MG TABS (SERTRALINE HCL) 1 tab by mouth once daily FIRST-PROGESTERONE VGS 50 50 MG SUPP (PROGESTERONE) as directed HYDROCHLOROTHIAZIDE 25 MG TABS (HYDROCHLOROTHIAZIDE) as directed ESTRADIOL 1 MG TABS (ESTRADIOL) 1 tab by mouth once daily FIORICET 50-325-40 MG TABS (BUTALBITAL-APAP-CAFFEINE) One or two by mouth q4 to 6hr as needed headache.  Max of 6/day  REVIEW OF SYSTEMS Constitutional Symptoms      Denies fever, chills, night sweats, weight loss, weight gain, and fatigue.  Eyes       Denies change in vision, eye pain, eye discharge, glasses, contact lenses, and eye surgery. Ear/Nose/Throat/Mouth       Denies hearing loss/aids, change in hearing, ear pain, ear discharge, dizziness, frequent runny nose, frequent nose bleeds, sinus problems, sore throat, hoarseness, and tooth pain or bleeding.  Respiratory       Denies dry cough, productive cough, wheezing, shortness of breath, asthma, bronchitis, and emphysema/COPD.  Cardiovascular       Denies murmurs, chest pain, and tires easily with exhertion.    Gastrointestinal       Denies stomach pain, nausea/vomiting, diarrhea, constipation, blood  in bowel movements, and indigestion. Genitourniary       Denies painful urination, kidney stones, and loss of urinary control. Neurological       Complains of headaches.      Denies paralysis, seizures, and fainting/blackouts. Musculoskeletal       Denies muscle pain, joint pain, joint stiffness, decreased range of motion, redness, swelling, muscle weakness, and gout.  Skin       Denies bruising, unusual mles/lumps or sores, and hair/skin or nail changes.  Psych       Denies mood changes, temper/anger issues, anxiety/stress, speech problems, depression, and sleep problems. Other Comments: Pt states she eaten foods with alot of sodium in it for the past three dys. Pt has not seen  her PCP for this.   Past History:  Past Medical History: Last updated: 12/08/2008 Adenomatous colon polyups, Gerd,irritable bowwel syndrome,hyperlidemia, history of coronary artery diease,family history of colon cancer - pt has had history of chest pain  Past Surgical History: Last updated: 12/08/2008 2 ankle surgeries  Family History: Last updated: 12/08/2008 father heart diease- mother ovarian cancer and DM  Social History: Last updated: 12/10/2008 married - no smoking -one glass of wine per night- quit tobacco in 2002 after a 30 pack year. Remote recreational drug use.   Objective:  No acute distress  Eyes:  Pupils are equal, round, and reactive to light and accomdation.  Extraocular movement is intact.  Conjunctivae are not inflamed.  Fundi normal Pharynx:  Normal  Neck:  Supple.  No adenopathy is present.  No thyromegaly is present  Lungs:  Clear to auscultation.  Breath sounds are equal.  Heart:  Regular rate and rhythm without murmurs, rubs, or gallops.  Abdomen:  Nontender without masses or hepatosplenomegaly.  Bowel sounds are present.  No CVA or flank tenderness.  Extremities:  No edema.  Pedal pulses are full and equal.  Neurologic:  Cranial nerves 2 through 12 are normal.  Patellar  and elbow reflexes are normal.  Cerebellar function is intact.  Gait and station are normal.    Assessment New Problems: HEADACHE (ICD-784.0) ELEVATED BLOOD PRESSURE WITHOUT DIAGNOSIS OF HYPERTENSION (ICD-796.2)  SUSPECT BP ELEVATION BECAUSE OF MIGRAINE HEADACHE PAIN RESPONSE.  Plan New Medications/Changes: FIORICET 50-325-40 MG TABS [facility]) One or two by mouth q4 to 6hr as needed headache.  Max of 6/day  #12 (twelve) x 1, 12/26/2009, Donna Christen MD  New Orders: Est. Patient Level III [phone] Planning Comments:   Continue HCTZ 25mg  QAM for 5 to 7 days.  Check BP daily. Rx for Fioricet.  Avoid sodium and [place] food.  Follow-up with PCP if BP remains elevated  and/or headache fails to resolve or worsens.   The patient and/or caregiver has been counseled thoroughly with regard to medications prescribed including dosage, schedule, interactions, rationale for use, and possible side effects and they verbalize understanding.  Diagnoses and expected course of recovery discussed and will return if not improved as expected or if the condition worsens. Patient and/or caregiver verbalized understanding.  Prescriptions: FIORICET 50-325-40 MG TABS (BUTALBITAL-APAP-CAFFEINE) One or two by mouth q4 to 6hr as needed headache.  Max of 6/day  #12 (twelve) x 1   Entered and Authorized by:   Donna Christen MD   Signed by:   Donna Christen MD on 12/26/2009   Method used:   Print then Give to Patient   RxID:   [id number]   Orders Added: 1)  Est. Patient Level III [[address]]  Appended Document: HA & High  BP rm 4 F/U cll to pt -  Courtesy call mess left on VM.

## 2010-06-12 NOTE — Letter (Signed)
Summary: [facility] Instructions  [facility]  [address] [place], [place] [address]   Phone: [phone]  Fax: [phone]       [name]    19-Sep-1950    MRN: [id number]       Procedure Day Dorna Bloom:  [facility]  03/05/10     Arrival Time: 1:00PM     Procedure Time:  2:00PM     Location of Procedure:                    _ X_  [facility] (4th Floor)  PREPARATION FOR COLONOSCOPY WITH MIRALAX  Starting 5 days prior to your procedure 02/28/10 do not eat nuts, seeds, popcorn, corn, beans, peas,  salads, or any raw vegetables.  Do not take any fiber supplements (e.g. Metamucil, Citrucel, and Benefiber). ____________________________________________________________________________________________________   THE DAY BEFORE YOUR PROCEDURE         DATE: 03/04/10  DATE:  Donna [name] AND WEDNESDAY  1   Drink clear liquids the entire day-NO SOLID FOOD  2   Do not drink anything colored red or purple.  Avoid juices with pulp.  No orange juice.  3   Drink at least 64 oz. (8 glasses) of fluid/clear liquids during the day to prevent dehydration and help the prep work efficiently.  CLEAR LIQUIDS INCLUDE: Water Jello Ice Popsicles Tea (sugar ok, no milk/cream) Powdered fruit flavored drinks Coffee (sugar ok, no milk/cream) Gatorade Juice: apple, white grape, white cranberry  Lemonade Clear bullion, consomm, broth Carbonated beverages (any kind) Strained chicken noodle soup Hard Candy  4   Mix the entire bottle of Miralax with 64 oz. of Gatorade/Powerade in the morning and put in the refrigerator to chill.  5   At 4:00 AM take 2 Dulcolax/Bisacodyl tablets.  6   At 5:30AM take one Reglan/Metoclopramide tablet.  7  Starting at 6:00AM drink one 8 oz glass of the Miralax mixture every 15-20 minutes until you have finished drinking the entire 64 oz.  You should finish drinking prep around 8:30-9:00AM.  8   If you are nauseated, you may take the 2nd  Reglan/Metoclopramide tablet at 7:30AM        9    At 9:00AM take 2 more DULCOLAX/Bisacodyl tablets.     THE DAY OF YOUR PROCEDURE      DATE:  03/05/10  DAY: Donna [name]  You may drink clear liquids until 12:00PM  (2 HOURS BEFORE PROCEDURE).   MEDICATION INSTRUCTIONS  Unless otherwise instructed, you should take regular prescription medications with a small sip of water as early as possible the morning of your procedure.       OTHER INSTRUCTIONS  You will need a responsible adult at least 59 years of age to accompany you and drive you home.   This person must remain in the waiting room during your procedure.  Wear loose fitting clothing that is easily removed.  Leave jewelry and other valuables at home.  However, you may wish to bring a book to read or an iPod/MP3 player to listen to music as you wait for your procedure to start.  Remove all body piercing jewelry and leave at home.  Total time from sign-in until discharge is approximately 2-3 hours.  You should go home directly after your procedure and rest.  You can resume normal activities the day after your procedure.  The day of your procedure you should not:   Drive   Make legal decisions   Operate machinery  Drink alcohol   Return to work  You will receive specific instructions about eating, activities and medications before you leave.   The above instructions have been reviewed and explained to me by   Wyona Almas RN  October 59, 2011 8:33 AM     I fully understand and can verbalize these instructions _____________________________ Date _______

## 2010-06-12 NOTE — Assessment & Plan Note (Signed)
Summary: Cardiology Nuclear Study  Nuclear Med Background Indications for Stress Test: Evaluation for Ischemia   History: Myocardial Perfusion Study  History Comments: '02 MPS: (-) ischemia, EF=58%  Symptoms: Chest Pain with Exertion, Chest Tightness with Exertion, DOE, Palpitations    Nuclear Pre-Procedure Cardiac Risk Factors: Family History - CAD, History of Smoking, Lipids Caffeine/Decaff Intake: none NPO After: 9:00 PM Lungs: clear IV 0.9% NS with Angio Cath: 22g     IV Site: (R) AC IV Started by: Irean Hong RN Chest Size (in) 38     Cup Size DDD     Height (in): 67 Weight (lb): 150 BMI: 23.58  Nuclear Med Study 1 or 2 day study:  1 day     Stress Test Type:  Stress Reading MD:  Charlton Haws, MD     Referring MD:  P.Ross Resting Radionuclide:  Technetium 76m Tetrofosmin     Resting Radionuclide Dose:  11.0 mCi  Stress Radionuclide:  Technetium 40m Tetrofosmin     Stress Radionuclide Dose:  33.0 mCi   Stress Protocol Exercise Time (min):  12:31 min     Max HR:  139 bpm     Predicted Max HR: 163 bpm  Max Systolic BP: 166 mm Hg     % Max HR:  85 %     METS: 13.40 Rate Pressure Product:  96295    Stress Test Technologist:  Milana Na EMT-P     Nuclear Technologist:  Domenic Polite CNMT  Rest Procedure  Myocardial perfusion imaging was performed at rest 45 minutes following the intraveneous administration of Myoview Technetium 10m Tetrofosmin.  Stress Procedure  The patient exercised for 12:31. The patient stopped due to fatigue and chest burning.  There were no significant ST-T wave changes.  Myoview was injected at peak exercise and myocardial perfusion imaging was performed after a brief delay.  QPS Raw Data Images:  Normal; no motion artifact; normal heart/lung ratio. Stress Images:  NI: Uniform and normal uptake of tracer in all myocardial segments. Rest Images:  Normal homogeneous uptake in all areas of the myocardium. Subtraction (SDS):  Normal  Transient Ischemic Dilatation:  .96  (Normal <1.22)  Lung/Heart Ratio:  .28  (Normal <0.45)  Quantitative Gated Spect Images QGS EDV:  92 ml QGS ESV:  40 ml QGS EF:  57 % QGS cine images:  normal  Findings Normal nuclear study      Overall Impression  Exercise Capacity: Good exercise capacity. BP Response: Normal blood pressure response. Clinical Symptoms: Atypical chest pain. ECG Impression: No significant ST segment change suggestive of ischemia. Overall Impression: Normal stress nuclear study. Overall Impression Comments: Normal  Appended Document: Cardiology Nuclear Study Stress test is normal.  If continues to have symptoms should  call back to be seen.  Continue ativity.  Fax results to Dr. Lenord Fellers.  Appended Document: Cardiology Nuclear Study Called patient and left message on machine to call us back for stress test results.  Appended Document: Cardiology Nuclear Study Pt. given results as per append by Dr. Tenny Craw.

## 2010-06-12 NOTE — Miscellaneous (Signed)
Summary: omeprazole order  Clinical Lists Changes  Medications: Added new medication of OMEPRAZOLE 40 MG  CPDR (OMEPRAZOLE) 1 each day 30 minutes before meal - Signed Rx of OMEPRAZOLE 40 MG  CPDR (OMEPRAZOLE) 1 each day 30 minutes before meal;  #30 x 11;  Signed;  Entered by: Alease Frame RN;  Authorized by: Meryl Dare MD [facility];  Method used: Electronically to [facility] [address], [place], [place]  [address], Ph: [phone], Fax: [phone] Allergies: Added new allergy or adverse reaction of PENICILLIN Observations: Added new observation of ALLERGY REV: Done (03/05/2010 14:40)    Prescriptions: OMEPRAZOLE 40 MG  CPDR (OMEPRAZOLE) 1 each day 30 minutes before meal  #30 x 11   Entered by:   Alease Frame RN   Authorized by:   Meryl Dare MD [facility]   Signed by:   Alease Frame RN on 03/05/2010   Method used:   Electronically to        [facility] (retail)       [address]       [place], [place]  [address]       Ph: [phone]       Fax: [phone]   RxID:   [id number]

## 2010-06-12 NOTE — Assessment & Plan Note (Signed)
Summary: L FOOT,MC   Vital Signs:  Patient profile:   60 year old female BP sitting:   170 / 90  Vitals Entered By: Lillia Pauls CMA (October 22, 2009 2:56 PM)  Primary Care Provider:  Eden Emms Baxley   History of Present Illness: 60 yo F here for left foot pain  Patient reports 3 weeks of pain centered at L 1st MTP mostly Pain worse on plantar aspect of foot Did have redness and swelling but this has gone down Now is more comfortable to walk - did have a noticeable limp No known injury No prior h/o gout Has tried ionto patches, advil (1 tablet a day which helps) Walks barefoot a lot as she does yoga.  Does not run for exercise. Has green inserts from achilles issues in fall but not providing enough support.  Allergies (verified): 1)  ! * Quinolones  Physical Exam  General:  Well-developed,well-nourished,in no acute distress; alert,appropriate and cooperative throughout examination Msk:  L foot: No gross deformity, swelling, bruising.  No hallux rigidus. No leg length inequality FROM foot and ankle Mild pain with ROM of 1st MTP TTP plantar aspect of 1st and 2nd MTPs.  No TTP directly over sesamoids. No redness noted currently. FROM foot and ankle. Neg thompsons, ant drawer, talar tilt.  No hallux rigidus No leg length inequality. Transverse arch breakdown. Fairly well preserved long arches. NV intact distally.  Gait with outturning of left foot with ambulation. Additional Exam:  MSK u/s:  Increased fluid within 1st MTP joint L foot but no evidence of crystals.  No increased neovascularity.  No irregularity of sesamoids, edema, or increased neovascularity around these.   Impression & Recommendations:  Problem # 1:  PAIN IN SOFT TISSUES OF LIMB (ICD-729.5) Assessment New Comforthotics with L shaped cutout to allow 1st MTP and sesamoids to drop into space - comfortable to patient with this adjustment.  Discussed likelihood of gout without known injury and  pain/redness/swelling that occurred at this joint that has largely resolved.  Increased joint fluid on ultrasound but no obvious crystals.  Continue advil 3 tabs three times a day with food for pain and inflammation.  Return if she notices pain recurring.  Discussed prescription diclofenac but she would like to continue with her advil instead.  Orders: Sports Insoles [phone])  Problem # 2:  METATARSALGIA (ICD-726.70) Assessment: New  Orders: Sports Insoles [phone])  Complete Medication List: 1)  Multivitamins Caps (Multiple vitamin) .... Take 1 capsule by mouth once a day 2)  Calcium Gluconate 500 Mg Tabs (Calcium gluconate) .... Take 1 tablet by mouth once a day 3)  Vitamin D 3 5,000 Iu Tablet  .... Take 1 tablet by mouth once a day 4)  Magnesium 500 Mg Tabs (Magnesium) .... Take 1 tablet by mouth once a day 5)  B Complex Tabs (B complex vitamins) .... Take 1 tablet by mouth once a day 6)  Resveratral 125mg  Cap  .... Take 1 capsule by mouth once a day 7)  Omega-3 Fish Oil 1200 Mg Caps (Omega-3 fatty acids) .... Take 1 capsule by mouth once a day 8)  Flax Seed Oil 1000 Mg Caps (Flaxseed (linseed)) .... Take 1 capsule by mouth once a day 9)  Zyflamend Capsule  .... Take 1 capsule by mouth once a day 10)  Nitroglycerin 0.4 Mg Subl (Nitroglycerin) .... One tablet under tongue every 5 minutes as needed for chest pain---may repeat times three 11)  Aspirin 81 Mg Tbec (Aspirin) .... Take one  tablet by mouth daily 12)  Voltaren 1 % Gel (Diclofenac sodium) .Marland Kitchen.. 1 gram qid  Patient Instructions: 1)  Take advil 3 tablets 3 times a day with food for pain and inflammation. 2)  You may certainly have had a gout flare - this is the most common reason to get pain in this location without an injury. 3)  Regardless the treatment is the same - antiinflammatories and cushion to unload the painful area 4)  Wear the green insoles with the cutout for your 1st MTP joint and sesamoids as much as possible. 5)  If  this flares up again, we'd be happy to see you and rescan the area. 6)  Otherwise follow up with [place] as needed.

## 2010-07-21 ENCOUNTER — Encounter: Payer: Self-pay | Admitting: *Deleted

## 2010-07-21 LAB — CBC
HCT: 38.6 % (ref 36.0–46.0)
Hemoglobin: 13.3 g/dL (ref 12.0–15.0)
MCH: 31.1 pg (ref 26.0–34.0)
MCHC: 34.5 g/dL (ref 30.0–36.0)
MCV: 90.4 fL (ref 78.0–100.0)
Platelets: 254 10*3/uL (ref 150–400)
RBC: 4.27 MIL/uL (ref 3.87–5.11)
RDW: 12.2 % (ref 11.5–15.5)
WBC: 5 10*3/uL (ref 4.0–10.5)

## 2010-07-21 LAB — COMPREHENSIVE METABOLIC PANEL
ALT: 25 U/L (ref 0–35)
AST: 30 U/L (ref 0–37)
Albumin: 4.3 g/dL (ref 3.5–5.2)
Alkaline Phosphatase: 101 U/L (ref 39–117)
BUN: 11 mg/dL (ref 6–23)
CO2: 26 mEq/L (ref 19–32)
Calcium: 9.2 mg/dL (ref 8.4–10.5)
Chloride: 102 mEq/L (ref 96–112)
Creatinine, Ser: 0.7 mg/dL (ref 0.4–1.2)
GFR calc Af Amer: >60 mL/min (ref >60)
GFR calc non Af Amer: >60 mL/min (ref >60)
Glucose, Bld: 98 mg/dL (ref 70–99)
Potassium: 4.2 mEq/L (ref 3.5–5.1)
Sodium: 140 mEq/L (ref 135–145)
Total Bilirubin: 0.5 mg/dL (ref 0.3–1.2)
Total Protein: 7.3 g/dL (ref 6.0–8.3)

## 2010-07-21 LAB — DIFFERENTIAL
Basophils Absolute: 0 10*3/uL (ref 0.0–0.1)
Basophils Relative: 0 % (ref 0–1)
Eosinophils Absolute: 0 10*3/uL (ref 0.0–0.7)
Eosinophils Relative: 0 % (ref 0–5)
Lymphocytes Relative: 17 % (ref 12–46)
Lymphs Abs: 0.8 10*3/uL (ref 0.7–4.0)
Monocytes Absolute: 0.4 10*3/uL (ref 0.1–1.0)
Monocytes Relative: 7 % (ref 3–12)
Neutro Abs: 3.8 10*3/uL (ref 1.7–7.7)
Neutrophils Relative %: 76 % (ref 43–77)

## 2010-07-21 LAB — LIPASE, BLOOD: Lipase: 85 U/L (ref 23–300)

## 2010-07-27 LAB — COMPREHENSIVE METABOLIC PANEL
ALT: 37 U/L — ABNORMAL HIGH (ref 0–35)
AST: 49 U/L — ABNORMAL HIGH (ref 0–37)
Albumin: 4.1 g/dL (ref 3.5–5.2)
Alkaline Phosphatase: 112 U/L (ref 39–117)
BUN: 8 mg/dL (ref 6–23)
CO2: 30 mEq/L (ref 19–32)
Calcium: 8.6 mg/dL (ref 8.4–10.5)
Chloride: 100 mEq/L (ref 96–112)
Creatinine, Ser: 0.7 mg/dL (ref 0.4–1.2)
GFR calc Af Amer: >60 mL/min (ref >60)
GFR calc non Af Amer: >60 mL/min (ref >60)
Glucose, Bld: 88 mg/dL (ref 70–99)
Potassium: 4.2 mEq/L (ref 3.5–5.1)
Sodium: 139 mEq/L (ref 135–145)
Total Bilirubin: 0.6 mg/dL (ref 0.3–1.2)
Total Protein: 7.3 g/dL (ref 6.0–8.3)

## 2010-07-27 LAB — POCT CARDIAC MARKERS
CKMB, poc: 1 ng/mL (ref 1.0–8.0)
CKMB, poc: <1 ng/mL — ABNORMAL LOW (ref 1.0–8.0)
Myoglobin, poc: 37 ng/mL (ref 12–200)
Myoglobin, poc: 47.3 ng/mL (ref 12–200)
Troponin i, poc: <0.05 ng/mL (ref 0.00–0.09)
Troponin i, poc: <0.05 ng/mL (ref 0.00–0.09)

## 2010-07-27 LAB — DIFFERENTIAL
Basophils Absolute: 0 10*3/uL (ref 0.0–0.1)
Basophils Relative: 1 % (ref 0–1)
Eosinophils Absolute: 0.1 10*3/uL (ref 0.0–0.7)
Eosinophils Relative: 2 % (ref 0–5)
Lymphocytes Relative: 39 % (ref 12–46)
Lymphs Abs: 1.6 10*3/uL (ref 0.7–4.0)
Monocytes Absolute: 0.3 10*3/uL (ref 0.1–1.0)
Monocytes Relative: 8 % (ref 3–12)
Neutro Abs: 2.2 10*3/uL (ref 1.7–7.7)
Neutrophils Relative %: 51 % (ref 43–77)

## 2010-07-27 LAB — CBC
HCT: 39 % (ref 36.0–46.0)
Hemoglobin: 13 g/dL (ref 12.0–15.0)
MCH: 31.2 pg (ref 26.0–34.0)
MCHC: 33.5 g/dL (ref 30.0–36.0)
MCV: 93.2 fL (ref 78.0–100.0)
Platelets: 233 10*3/uL (ref 150–400)
RBC: 4.18 MIL/uL (ref 3.87–5.11)
RDW: 12.6 % (ref 11.5–15.5)
WBC: 4.2 10*3/uL (ref 4.0–10.5)

## 2010-07-27 LAB — LIPASE, BLOOD: Lipase: 200 U/L (ref 23–300)

## 2010-08-22 ENCOUNTER — Encounter: Payer: Self-pay | Admitting: Internal Medicine

## 2010-08-28 ENCOUNTER — Other Ambulatory Visit: Payer: Self-pay | Admitting: Internal Medicine

## 2010-09-02 ENCOUNTER — Encounter (INDEPENDENT_AMBULATORY_CARE_PROVIDER_SITE_OTHER): Payer: Commercial Managed Care - PPO | Admitting: Internal Medicine

## 2010-09-02 ENCOUNTER — Other Ambulatory Visit (HOSPITAL_COMMUNITY)
Admission: RE | Admit: 2010-09-02 | Discharge: 2010-09-02 | Disposition: A | Discharge status: Home / Self Care | Payer: Commercial Managed Care - PPO | Source: Ambulatory Visit | Attending: Internal Medicine | Admitting: Internal Medicine

## 2010-09-02 DIAGNOSIS — E785 Hyperlipidemia, unspecified: Secondary | ICD-10-CM

## 2010-09-02 DIAGNOSIS — Z01419 Encounter for gynecological examination (general) (routine) without abnormal findings: Secondary | ICD-10-CM | POA: Insufficient documentation

## 2010-09-11 ENCOUNTER — Encounter: Payer: Self-pay | Admitting: Family Medicine

## 2010-09-11 ENCOUNTER — Ambulatory Visit
Admission: RE | Admit: 2010-09-11 | Discharge: 2010-09-11 | Disposition: A | Discharge status: Home / Self Care | Payer: Commercial Managed Care - PPO | Source: Ambulatory Visit | Attending: Family Medicine | Admitting: Family Medicine

## 2010-09-11 ENCOUNTER — Other Ambulatory Visit: Payer: Self-pay | Admitting: Family Medicine

## 2010-09-11 ENCOUNTER — Inpatient Hospital Stay (INDEPENDENT_AMBULATORY_CARE_PROVIDER_SITE_OTHER)
Admission: RE | Admit: 2010-09-11 | Discharge: 2010-09-11 | Disposition: A | Discharge status: Home / Self Care | Payer: Commercial Managed Care - PPO | Source: Ambulatory Visit | Attending: Family Medicine | Admitting: Family Medicine

## 2010-09-11 DIAGNOSIS — R05 Cough: Secondary | ICD-10-CM

## 2010-09-11 DIAGNOSIS — R059 Cough, unspecified: Secondary | ICD-10-CM

## 2010-09-11 DIAGNOSIS — R079 Chest pain, unspecified: Secondary | ICD-10-CM

## 2010-09-12 ENCOUNTER — Encounter: Payer: Self-pay | Admitting: Family Medicine

## 2010-09-14 ENCOUNTER — Telehealth (INDEPENDENT_AMBULATORY_CARE_PROVIDER_SITE_OTHER): Payer: Self-pay

## 2010-09-26 NOTE — Op Note (Signed)
Donna [name], Donna [name]               ACCOUNT NO.:  [id number]   MEDICAL RECORD NO.:  [id number]          PATIENT TYPE:  AMB   LOCATION:  DSC                          FACILITY:  [facility]   PHYSICIAN:  Leonides Grills, M.D.     DATE OF BIRTH:  [birth year]   DATE OF PROCEDURE:  04/01/2004  DATE OF DISCHARGE:                                 OPERATIVE REPORT   PREOPERATIVE DIAGNOSES:  1.  Left ankle impingement.  2.  Left subluxing peroneal tendons.  3.  Left peroneus brevis tear.  4.  Calcaneal spur.   POSTOPERATIVE DIAGNOSES:  1.  Left ankle impingement.  2.  Left subluxing peroneal tendons.  3.  Left peroneus brevis tear.  4.  Calcaneal spur.   OPERATIONS:  1.  Left ankle arthroscopy, with extensive debridement.  2.  Left repair of subluxing peroneal tendons, without osteotomy.  3.  Left debridement and partial excision left peroneus brevis tendon tear      and degeneration.  4.  Repair left split peroneus brevis tendon.  5.  Excision calcaneal spur.   ANESTHESIA:  General endotracheal tube.   SURGEON:  Leonides Grills, M.D.   ASSISTANT:  None.   TOURNIQUET TIME:  Approximately 45 minutes.   COMPLICATIONS:  None.   DISPOSITION:  Stable to the PR.   INDICATIONS:  This is a 60 year old female with longstanding anterolateral  and lateral ankle pain that was interfering with her life when she goes to  do what she wants to do.  She was consented for the above procedure.  All  risks, which include infection, nerve or vessel injury, persistent pain,  worse pain, stiffness, arthritis, prolonged recovery, and possible future  surgery were all explained.  Questions were encouraged and answered.   OPERATION:  The patient was brought to the operating room and placed in the  supine position.  After adequate general endotracheal tube anesthesia was  administered, with popliteal block as well as Ancef 1 g IV piggyback.  We  then placed the patient in sloppy lateral position with the operative  site  up on a beanbag.  All bony prominences were well padded.  The left lower  extremity was then prepped and draped in a sterile manner, with a proximally  placed thigh tourniquet.  The limb was gravity exsanguinated, and the  tourniquet was elevated to 290 mmHg.  A curvilinear incision was then made  over the lateral posterolateral aspect of the ankle.  Dissection was carried  down through skin and hemostasis obtained.  Peroneal retinaculum was then  identified approximately 2 mm posterior to the posterolateral edge of the  lateral malleolus.  This was then incised.  There was a large amount of  synovitis throughout the peroneal tendons.  We then did a formal  tenosynovectomy of all the inflammation within both the peroneus brevis and  longus tendon.  Distally, the peroneal tendons came to a stenotic area, and  the inferior peroneal retinaculum was then released.  The peroneal tubercle  was very prominent and was rubbing against the peroneus brevis tendon,  causing a degenerative nodule within  the tendon.  We then excised the  peroneal tubercle of the calcaneus, i.e., calcaneal spur, with a rongeur,  which created a nice open area for both tendons.  Once the nodule was then  excised from the peroneus brevis tendon tear, we then repaired the tear with  5-0 nylon sutures in a cannulated manner.  Once this was done, we then  created a trough in the posterolateral aspect of the lateral malleolus, with  a curved quarter-inch osteotome and a rongeur.  We then made 2 mm drill  holes in the lateral malleolus, and then after the area was copiously  irrigated with normal saline we repaired the retinaculum with #2 fiber wire  as well as 2-0 fiber wire.  This had an excellent repair.  After this was  completely done, the area was copiously irrigated with normal saline.  Tourniquet was deflated.  Hemostasis was obtained.  Subcutaneous was closed  with 3-0 Vicryl.  Skin was closed with 4-0 nylon.  We  then anatomically drew  out the landmarks of the anterior tibialis tendon, peroneus tertius, and  superficial peroneal nerve.  We then placed a spinal needle and placed 20 cc  of normal saline into the ankle.  The anterior medial portal was then  created with a nick and spread technique medial to the anterior tibialis  tendon.  Blunt-tip trocar with cannula followed by camera was then placed  into the ankle under direct visualization.  Spinal needle was then placed  just lateral to the peroneus tertius tendon.  We then created the  anterolateral portal with nick and spread technique.  The accessory tib-fib  ligament was rubbing the anterolateral horn of the talar dome, and there was  synovitis throughout this area.  This was then extensively debrided with a  radiofrequency bevel and shaver.  Once this was completely done, as well as  the lateral gutter of the ankle, we then switched places and placed the  camera anterolaterally, instrumentation anteromedially, and there was a flap  of tissue on the anteromedial aspect of the tibia that was rubbing into the  lateral corner of the talar dome.  This was then removed with a synovectomy  rongeur.  The medial gutter was clear of any pathology.  In the remaining  portion of cartilage, there were no osteochondral lesions.  Everything was  pristine.  The camera was removed.  Wounds were closed with 4-0 nylon  suture.  A sterile dressing was applied.  Jones dressing was applied.  The  patient was stable to the PR.      Paul   PB/MEDQ  D:  04/01/2004  T:  04/01/2004  Job:  [id number]

## 2010-09-30 ENCOUNTER — Other Ambulatory Visit: Payer: Self-pay

## 2010-09-30 DIAGNOSIS — I10 Essential (primary) hypertension: Secondary | ICD-10-CM

## 2010-09-30 MED ORDER — HYDROCHLOROTHIAZIDE 25 MG PO TABS: 25.0000 mg | ORAL_TABLET | Freq: Every day | ORAL | Status: DC

## 2011-01-07 ENCOUNTER — Ambulatory Visit (INDEPENDENT_AMBULATORY_CARE_PROVIDER_SITE_OTHER): Payer: Commercial Managed Care - PPO | Admitting: Gastroenterology

## 2011-01-07 ENCOUNTER — Encounter: Payer: Self-pay | Admitting: Gastroenterology

## 2011-01-07 VITALS — BP 124/60 | HR 76 | Ht 67.0 in | Wt 164.0 lb

## 2011-01-07 DIAGNOSIS — K5900 Constipation, unspecified: Secondary | ICD-10-CM

## 2011-01-07 DIAGNOSIS — K219 Gastro-esophageal reflux disease without esophagitis: Secondary | ICD-10-CM

## 2011-01-07 NOTE — Progress Notes (Signed)
History of Present Illness: This is a 60 year old female who has 2 months of constipation and bloating. She states she generally has 3 bowel movements daily but now is having one smaller bowel movements daily associated with bloating. She does not feel like she is completely evacuating. She denies any recent medication, dietary or lifestyle changes. Denies weight loss, abdominal pain, diarrhea, change in stool caliber, melena, hematochezia, nausea, vomiting, dysphagia, reflux symptoms, chest pain. She underwent upper endoscopy and colonoscopy in October 2011 with findings of erosive esophagitis, gastritis, diverticulosis and an adenomatous colon polyp. In addition she was recently evaluated by Dr. Shon Baton and told of a calcification in her iliac artery. She was advised to discuss that further with [place] for unclear reasons. Her reflux symptoms are well controlled on omeprazole 40 mg daily. Patient has not been able to obtain a copy of her x-rays from Dr. Shon Baton' office. His office note dated August 17 reports that her lumbar films were within normal limits with no significant spinal disorder.  Current Medications, Allergies, Past Medical History, Past Surgical History, Family History and Social History were reviewed in [employer] record.  Physical Exam: General: Well developed , well nourished, no acute distress Head: Normocephalic and atraumatic Eyes:  sclerae anicteric, EOMI Ears: Normal auditory acuity Mouth: No deformity or lesions Lungs: Clear throughout to auscultation Heart: Regular rate and rhythm; no murmurs, rubs or bruits Abdomen: Soft, non tender and non distended. No masses, hepatosplenomegaly or hernias noted. Normal Bowel sounds Musculoskeletal: Symmetrical with no gross deformities  Pulses:  Normal pulses noted Extremities: No clubbing, cyanosis, edema or deformities noted Neurological: Alert oriented x 4, grossly nonfocal Psychological:  Alert and cooperative.  Normal mood and affect  Assessment and Recommendations:  1. Constipation and bloating. Increase dietary fiber and water intake. Begin MiraLax once or twice daily titrated for adequate bowel movements.  2. GERD with erosive esophagitis. Continue same antireflux measures and omeprazole 40 mg daily.   3. Personal history of adenomatous colon polyps. Surveillance colonoscopy October 2016.  4. Calcified iliac artery by patient report. Once she obtains the abdominal films I have recommended that she review these with Dr. Lenord Fellers or her cardiologist.

## 2011-01-07 NOTE — Patient Instructions (Addendum)
Use Miralax over the counter 17 grams in 8 oz of water 1-2 x daily for constipation.  High-Fiber Diet A high-fiber diet changes your normal diet to include more whole grains, legumes, fruits, and vegetables. Changes in the diet involve replacing refined carbohydrates with unrefined foods. The calorie level of the diet is essentially unchanged. The Dietary Reference Intake (recommended amount) for adult males is 38 grams per day. For adult females, it is 25 grams per day. Pregnant and lactating women should consume 28 grams of fiber per day. Fiber is the intact part of a plant that is not broken down during digestion. Functional fiber is fiber that has been isolated from the plant to provide a beneficial effect in the body. PURPOSE  Increase stool bulk.   Ease and regulate bowel movements.   Lower cholesterol.  INDICATIONS THAT YOU NEED MORE FIBER  Constipation and hemorrhoids.   Uncomplicated diverticulosis (intestine condition) and irritable bowel syndrome.   Weight management.   As a protective measure against hardening of the arteries (atherosclerosis), diabetes, and cancer.  NOTE OF CAUTION If you have a digestive or bowel problem, ask your caregiver for advice before adding high-fiber foods to your diet. Some of the following medical problems are such that a high-fiber diet should not be used without consulting your caregiver. DO NOT USE WITH:  Acute diverticulitis (intestine infection).   Partial small bowel obstructions.   Complicated diverticular disease involving bleeding, rupture (perforation), or abscess (boil, furuncle).   Presence of autonomic neuropathy (nerve damage) or gastric paresis (stomach cannot empty itself).  GUIDELINES FOR INCREASING FIBER IN THE DIET  Start adding fiber to the diet slowly. A gradual increase of about 5 more grams (2 slices of whole-wheat bread, 2 servings of most fruits or vegetables, or 1 bowl of high-fiber cereal) per day is best. Too rapid  an increase in fiber may result in constipation, flatulence, and bloating.   Drink enough water and fluids to keep your urine clear or pale yellow. Water, juice, or caffeine-free drinks are recommended. Not drinking enough fluid may cause constipation.   Eat a variety of high-fiber foods rather than one type of fiber.   Try to increase your intake of fiber through using high-fiber foods rather than fiber pills or supplements that contain small amounts of fiber.   The goal is to change the types of food eaten. Do not supplement your present diet with high-fiber foods, but replace foods in your present diet.  INCLUDE A VARIETY OF FIBER SOURCES  Replace refined and processed grains with whole grains, canned fruits with fresh fruits, and incorporate other fiber sources. White rice, white breads, and most bakery goods contain little or no fiber.   Brown whole-grain rice, buckwheat oats, and many fruits and vegetables are all good sources of fiber. These include: broccoli, Brussels sprouts, cabbage, cauliflower, beets, sweet potatoes, white potatoes (skin on), carrots, tomatoes, eggplant, squash, berries, fresh fruits, and dried fruits.   Cereals appear to be the richest source of fiber. Cereal fiber is found in whole grains and bran. Bran is the fiber-rich outer coat of cereal grain, which is largely removed in refining. In whole-grain cereals, the bran remains. In breakfast cereals, the largest amount of fiber is found in those with "bran" in their names. The fiber content is sometimes indicated on the label.   You may need to include additional fruits and vegetables each day.   In baking, for 1 cup white flour, you may use the following substitutions:  1 cup whole-wheat flour minus 2 tablespoons.   1/2 cup white flour plus 1/2 cup whole-wheat flour.  References: Dietary Reference Intakes: Recommended Intakes for Individuals. [employer]. Institute of Medicine. Food and  Nutrition Board. Document Released: 04/27/2005 Document Re-Released: 07/22/2009 [facility] Patient Information 2011 [place], [place].   cc: Sharlet Salina, MD        Venita Lick, MD

## 2011-01-08 ENCOUNTER — Encounter: Payer: Self-pay | Admitting: Gastroenterology

## 2011-01-14 ENCOUNTER — Encounter: Payer: Self-pay | Admitting: Internal Medicine

## 2011-02-27 ENCOUNTER — Other Ambulatory Visit: Payer: Self-pay | Admitting: Internal Medicine

## 2011-02-27 ENCOUNTER — Other Ambulatory Visit: Payer: Commercial Managed Care - PPO | Admitting: Internal Medicine

## 2011-02-27 DIAGNOSIS — E785 Hyperlipidemia, unspecified: Secondary | ICD-10-CM

## 2011-02-27 DIAGNOSIS — Z79899 Other long term (current) drug therapy: Secondary | ICD-10-CM

## 2011-02-27 LAB — HEPATIC FUNCTION PANEL
ALT: 17 U/L (ref 0–35)
AST: 25 U/L (ref 0–37)
Albumin: 4.5 g/dL (ref 3.5–5.2)
Alkaline Phosphatase: 66 U/L (ref 39–117)
Bilirubin, Direct: 0.1 mg/dL (ref 0.0–0.3)
Indirect Bilirubin: 0.6 mg/dL (ref 0.0–0.9)
Total Bilirubin: 0.7 mg/dL (ref 0.3–1.2)
Total Protein: 7.1 g/dL (ref 6.0–8.3)

## 2011-02-27 LAB — LIPID PANEL
Cholesterol: 176 mg/dL (ref 0–200)
HDL: 61 mg/dL (ref >39)
LDL Cholesterol: 104 mg/dL — ABNORMAL HIGH (ref 0–99)
Total CHOL/HDL Ratio: 2.9 Ratio
Triglycerides: 55 mg/dL (ref <150)
VLDL: 11 mg/dL (ref 0–40)

## 2011-03-03 ENCOUNTER — Ambulatory Visit (INDEPENDENT_AMBULATORY_CARE_PROVIDER_SITE_OTHER): Payer: Commercial Managed Care - PPO | Admitting: Internal Medicine

## 2011-03-03 ENCOUNTER — Encounter: Payer: Self-pay | Admitting: Internal Medicine

## 2011-03-03 VITALS — BP 120/74 | HR 76 | Temp 98.3°F | Ht 67.5 in | Wt 160.0 lb

## 2011-03-03 DIAGNOSIS — E785 Hyperlipidemia, unspecified: Secondary | ICD-10-CM

## 2011-03-03 LAB — GLUCOSE, RANDOM: Glucose, Bld: 99 mg/dL (ref 70–99)

## 2011-03-07 ENCOUNTER — Encounter: Payer: Self-pay | Admitting: Internal Medicine

## 2011-03-07 NOTE — Patient Instructions (Signed)
It have been provided note to excuse you from influenza immunization at work. Please keep up the good work with diet and exercise for controlling lipids. Return in 6 months for physical examination. Her blood pressure is under excellent control at present time.

## 2011-03-07 NOTE — Progress Notes (Signed)
  Subjective:    Patient ID: Donna [name], female    DOB: [birth year], 60 y.o.   MRN: [id number]  HPI 60 year old white female with history of hyperlipidemia in today for discussion of lipid panel results. Currently watching her diet. Not on medication for hyperlipidemia. Blood pressure is stable. It has been labile in the past at times. Says she is  allergic to eggs and cannot take influenza immunization through her employment. Note provided to her for that. Fasting glucose added to labs drawn recently. No other complaints or problems and says she's doing well otherwise.    Review of Systems     Objective:   Physical Exam chest clear; cardiac exam regular rate and rhythm; extremities without edema        Assessment & Plan:  Hyperlipidemia-pleased with results of recent lipid panel. Has achieved results with just diet and exercise alone.  History of alcohol abuse in the remote past  History of depression  History of labile hypertension  Plan is to return in 6 months for physical examination. I might add she is on compounded estrogen replacement per [facility] and this was recently refilled for when necessary one year

## 2011-04-07 ENCOUNTER — Ambulatory Visit: Payer: Commercial Managed Care - PPO | Admitting: Sports Medicine

## 2011-04-08 ENCOUNTER — Ambulatory Visit (INDEPENDENT_AMBULATORY_CARE_PROVIDER_SITE_OTHER): Payer: 59 | Admitting: Sports Medicine

## 2011-04-08 ENCOUNTER — Ambulatory Visit: Payer: Commercial Managed Care - PPO | Admitting: Sports Medicine

## 2011-04-08 VITALS — BP 128/70 | Ht 67.0 in | Wt 156.0 lb

## 2011-04-08 DIAGNOSIS — M25569 Pain in unspecified knee: Secondary | ICD-10-CM

## 2011-04-08 DIAGNOSIS — M2020 Hallux rigidus, unspecified foot: Secondary | ICD-10-CM

## 2011-04-08 DIAGNOSIS — M2021 Hallux rigidus, right foot: Secondary | ICD-10-CM

## 2011-04-08 NOTE — Progress Notes (Signed)
  Subjective:    Patient ID: Donna [name], female    DOB: [birth year], 60 y.o.   MRN: [id number]  HPI This is Baxley is a 60 year old female who presents as a referral from her PCP (Dr. Lenord Fellers) for an initial visit to evaluate right heel pain, L foot sesamoiditis, and left knee pain.  1. 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 She points right heel pain x2-3 weeks. Pain exacerbated by standing on her feet for long hours, and certain yoga positions require extension of her toes. She states that the pain is gradual onset. She denies acute injury or fall. Self-care is concluded and occasional use of Motrin and use of over-the-counter orthotics for the last week. She reports gradual improvement in symptoms since use  the orthotics. The pain is located on the medial aspect of the right heel and midfoot along the long arch. The  2. L foot sesmoiditis: diagnosed on x-ray 2 year ago. Painful at the end of the day. No worse than usual. Takes motrin prn, but tries to avoid.   3. Left knee pain: Anterior knee  pain with extension x 3 months. Pain most notice bale during hiking, with lunges and during kneeling. No history of trauma or fall. Patient denies swelling or redness over the knee.   Review of Systems Pertinent ROS as per HPI, in addition patient denies fevers, chills, weight loss.     Objective:   Physical Exam Gen: well developed female, NAD. Moderate abdominal obesity, otherwise thin.  Hip: abductor strength full. Non tender.  L Knee:  -Inspection: no swelling, redness, warmth or bruising -Palpation: tender to deep palpation over the patella. Non-tender of the patellar ligament and quadriceps tendon.   -Strength: 5/5 strength in knee extensors and flexors.  ROM: full extension and flexion.  -Negative Lachman's and McMurray's. -2+ reflexes bilaterally.  Leg length: L leg 1 cm longer than R.  Foot:  -Inspection: bilateral pes cavus,  flattened medial arch R>L. Hallux valgus deformity on R.  Normal calcaneus without posterior hypertrophy, no noticeable erythema, edema or ecchymosis.  - Palpation: TTP on L foot over medial calcaneus at insertion of plantar fascia, palpable nodule in distal PF. TTP on L plantar surface across medial arch.  TTP over R 1st MTP joint.  -Strength: full bilaterally. Patient able to stand on toes without difficulty.  -ROM: Decreased flexion of R great toe compared to L. Pain elicited with flexion against resistance.    U/S: 1. L foot: PF 0.45 cm at proximal insertion. Nodule 0.3 cm in length with surrounding cystis changes at mid arch deep to point of maximal tenderness.   2. R foot: Bone spur at R MTP. Irregular cortex around first phalangeal sesamoid bones. Normal PF 3. L knee: intact quadriceps tendon and patellar ligament. Scar tissue and fluid in patellar groove.        Assessment & Plan:

## 2011-04-08 NOTE — Patient Instructions (Signed)
Mrs. Melichar,  Thank you for coming in today.  Please f/u for custom orthotics.   Please wear your foot strap.  Please work to increase flexion in your R great toe.   -Dr. Lorrin Jackson

## 2011-04-08 NOTE — Assessment & Plan Note (Addendum)
A: R hallux rigidus with nodule with plantar fascia.  P: - exercises to increase flexion in your R great toe.  -wear hard shoes/stay on mat during yoga. -patient provided with arch strap.  -patient to f/u for custom orthotics.   With the degree of change int the foot and her need to be on hard surfaces I think she will keep getting injuries without orthotic correction (Achilles, hallux and pf already on this foot)

## 2011-04-08 NOTE — Assessment & Plan Note (Addendum)
A: evidence of chronic inflammation (scarring) in patellar groove. Patient very active and kneels frequently. P: lifestyle modification. Patient advised to use a mat when kneeling to provide additional cushion.  Needs to avoid too much loading in a bent knee position and we discussed how to modify some yoga poses  Reck for custom orthotics - and this may also take some pressure off knee

## 2011-04-13 NOTE — Telephone Encounter (Signed)
  Phone Note Outgoing Call   Call placed by: Linton Flemings RN,  Sep 14, 2010 2:12 PM Call placed to: Patient Summary of Call: pt states she is doing better, instructed to come by or call with questions/concerns

## 2011-04-13 NOTE — Progress Notes (Signed)
Summary: PRESSURE IN CHEST & UPPER BACK W/COUGH/TJ rm 4   Vital Signs:  Patient Profile:   60 Years Old Female CC:      chest and upper back ache when coughs Height:     67 inches Weight:      159.50 pounds O2 Sat:      99 % O2 treatment:    Room Air Temp:     98.4 degrees F oral Pulse rate:   70 / minute Resp:     16 per minute BP sitting:   164 / 89  (left arm) Cuff size:   regular  Vitals Entered By: Clemens Catholic LPN (Sep 10, 1608 6:03 PM)                  Updated Prior Medication List: No Medications Current Allergies (reviewed today): ! * QUINOLONES ! * MOVI PREP ! PENICILLINHistory of Present Illness Chief Complaint: chest and upper back ache when coughs History of Present Illness:  Subjective:  Patient complains of onset of vague ache in her anterior chest yesterday, followed by an ache between her shoulder blades.  She developed nasal congestion yesterday and has had a mild cough for two days.  She has also had chills but no fever.  She recalls having some mild GI symptoms about 5 days ago that resolved.  REVIEW OF SYSTEMS Constitutional Symptoms      Denies fever, chills, night sweats, weight loss, weight gain, and fatigue.  Eyes       Denies change in vision, eye pain, eye discharge, glasses, contact lenses, and eye surgery. Ear/Nose/Throat/Mouth       Denies hearing loss/aids, change in hearing, ear pain, ear discharge, dizziness, frequent runny nose, frequent nose bleeds, sinus problems, sore throat, hoarseness, and tooth pain or bleeding.  Respiratory       Denies dry cough, productive cough, wheezing, shortness of breath, asthma, bronchitis, and emphysema/COPD.  Cardiovascular       Complains of chest pain.      Denies murmurs and tires easily with exhertion.    Gastrointestinal       Denies stomach pain, nausea/vomiting, diarrhea, constipation, blood in bowel movements, and indigestion. Genitourniary       Denies painful urination, kidney stones,  and loss of urinary control. Neurological       Denies paralysis, seizures, and fainting/blackouts. Musculoskeletal       Denies muscle pain, joint pain, joint stiffness, decreased range of motion, redness, swelling, muscle weakness, and gout.  Skin       Denies bruising, unusual mles/lumps or sores, and hair/skin or nail changes.  Psych       Denies mood changes, temper/anger issues, anxiety/stress, speech problems, depression, and sleep problems. Other Comments: pt states that she had a stomach virus this past wkend, which has since resolved. Yesterday she developed chest and upper back pain with coughing. no fever. no OTC meds.   Past History:  Past Medical History: Reviewed history from 12/08/2008 and no changes required. Adenomatous colon polyups, Gerd,irritable bowwel syndrome,hyperlidemia, history of coronary artery diease,family history of colon cancer - pt has had history of chest pain  Past Surgical History: 2 ankle surgeries Tubal ligation  Family History: Reviewed history from 12/08/2008 and no changes required. father heart diease mother ovarian cancer and DM  Social History: married - no smoking - 2 glass of wine per wk- quit tobacco in 2002 after a 30 pack year. Remote recreational drug use.   Objective:  Appearance:  Patient appears healthy, stated age, and in no acute distress  Eyes:  Pupils are equal, round, and reactive to light and accomdation.  Extraocular movement is intact.  Conjunctivae are not inflamed.  Ears:  Canals normal.  Tympanic membranes normal.   Nose:  Mildly congested turbinates.  No sinus tenderness  Pharynx:  Normal  Neck:  Supple.  No adenopathy is present.   Lungs:  Clear to auscultation.  Breath sounds are equal.  Chest:  No tenderness over sternum Heart:  Regular rate and rhythm without murmurs, rubs, or gallops.  Abdomen:  Nontender without masses or hepatosplenomegaly.  Bowel sounds are present.  No CVA or flank tenderness.  Back:   No tenderness Extremities:  No edema.  Skin:  No rash EKG: No acute changes Chest X-ray:  Negative CBC:  WBC 6.6; 24.1 LY, 6.4 MO, 69.5 GR; Hgb 12.9 Assessment New Problems: COUGH (ICD-786.2) CHEST PAIN, ACUTE (ICD-786.50)  SUSPECT VIRUAL URI  Plan New Orders: CBC w/Diff [[id number]] T-Chest x-ray, 2 views [71020] EKG w/ Interpretation [93000] Services provided After hours-Weekends-Holidays [99051] Pulse Oximetry (single measurment) [94760] Est. Patient Level IV [09811] Planning Comments:   Treat symptomatically for now:  expectorant, NSAID, rest, increased fluids. Follow-up with PCP if not improving.   The patient and/or caregiver has been counseled thoroughly with regard to medications prescribed including dosage, schedule, interactions, rationale for use, and possible side effects and they verbalize understanding.  Diagnoses and expected course of recovery discussed and will return if not improved as expected or if the condition worsens. Patient and/or caregiver verbalized understanding.   Orders Added: 1)  CBC w/Diff [[id number]] 2)  T-Chest x-ray, 2 views [71020] 3)  EKG w/ Interpretation [93000] 4)  Services provided After hours-Weekends-Holidays [99051] 5)  Pulse Oximetry (single measurment) [94760] 6)  Est. Patient Level IV [13086]

## 2011-05-19 ENCOUNTER — Ambulatory Visit (INDEPENDENT_AMBULATORY_CARE_PROVIDER_SITE_OTHER): Payer: 59 | Admitting: Sports Medicine

## 2011-05-19 VITALS — BP 136/76

## 2011-05-19 DIAGNOSIS — M7750 Other enthesopathy of unspecified foot: Secondary | ICD-10-CM

## 2011-05-19 DIAGNOSIS — M2021 Hallux rigidus, right foot: Secondary | ICD-10-CM

## 2011-05-19 DIAGNOSIS — M2020 Hallux rigidus, unspecified foot: Secondary | ICD-10-CM

## 2011-05-19 NOTE — Assessment & Plan Note (Addendum)
The right foot shows fairly extensive hallux rigidus the think is also triggering plantar fasciitis symptoms. She does not have a good push off with gait  We plan to put her in custom orthotics it will take pressure off both the plantar fascia and the hallux rigidus in the right foot as well as cushioning the injured sesamoid on the left foot  Patient was fitted for a standard, cushioned, semi-rigid orthotic.  The orthotic was heated and the patient stood on the orthotic blank positioned on the orthotic stand. The patient was positioned in subtalar neutral position and 10 degrees of ankle dorsiflexion in a weight bearing stance. After molding, a stable Fast-Tech EVA base was applied to the orthotic blank.   The blank was ground to a stable position for weight bearing. Size:9 red cambray base: Blue [employer]: first ray post RT additional orthotic padding: MT left Time 40 mins   her walking gait looked very well controlled and was very comfortable after we completed the orthotics  She is going to monitor to see if this gets rid of her plantar fascial pain and pain along the forefoot on the right and on the sesamoid on the left. If so she will return when necessary. If she's not seen benefit in 6 weeks I would like to recheck her.

## 2011-05-19 NOTE — Progress Notes (Signed)
  Subjective:    Patient ID: Donna [name], female    DOB: 05-10-1951, 61 y.o.   MRN: [id number]  HPI  Pt presents to clinic for f/u of rt heel pain which is still painful, worse in the mornings. Stretches and heel raises started again this week.  On last visit she was found to have hallux rigidus. We think this was putting more stress over the plantar fascia on the right. Lt sesamoiditis improved with OTC orthotics, not needing dancer pads currently.  Lt knee pain is much improved. No real grinding giving way or pain at this time.  Review of Systems     Objective:   Physical Exam No acute distress  Rt foot- tenderness at insertion of PF  Limited rt great toe motion Changes at 1st MTP on rt with spurring and hypertrophy   20 degrees flexion of lt great toe, normal extension The sesamoid area is not tender to palpation today  Both feet show some distal metatarsal fat pad callusing but I think most of this is from yoga and not from that much transverse arch breakdown  She actually has decent preservation of her longitudinal and transverse arches.        Assessment & Plan:

## 2011-05-19 NOTE — Assessment & Plan Note (Signed)
Continue using padding over the metatarsal area of the left foot as this seems to take pressure off of her medial sesamoid  I suggested trying to buy a special shoe that dancer use that she can utilize while doing yoga and we can add  pads to that

## 2011-07-07 ENCOUNTER — Ambulatory Visit: Payer: 59 | Admitting: Internal Medicine

## 2011-09-04 ENCOUNTER — Other Ambulatory Visit: Payer: 59 | Admitting: Internal Medicine

## 2011-09-08 ENCOUNTER — Encounter: Payer: 59 | Admitting: Internal Medicine

## 2011-10-08 ENCOUNTER — Other Ambulatory Visit: Payer: 59 | Admitting: Internal Medicine

## 2011-10-08 DIAGNOSIS — E785 Hyperlipidemia, unspecified: Secondary | ICD-10-CM

## 2011-10-08 DIAGNOSIS — Z0000 Encounter for general adult medical examination without abnormal findings: Secondary | ICD-10-CM

## 2011-10-08 LAB — COMPREHENSIVE METABOLIC PANEL
ALT: 19 U/L (ref 0–35)
AST: 22 U/L (ref 0–37)
Albumin: 4.1 g/dL (ref 3.5–5.2)
Alkaline Phosphatase: 52 U/L (ref 39–117)
BUN: 8 mg/dL (ref 6–23)
CO2: 26 mEq/L (ref 19–32)
Calcium: 8.9 mg/dL (ref 8.4–10.5)
Chloride: 102 mEq/L (ref 96–112)
Creat: 0.72 mg/dL (ref 0.50–1.10)
Glucose, Bld: 84 mg/dL (ref 70–99)
Potassium: 4.1 mEq/L (ref 3.5–5.3)
Sodium: 136 mEq/L (ref 135–145)
Total Bilirubin: 0.7 mg/dL (ref 0.3–1.2)
Total Protein: 6.4 g/dL (ref 6.0–8.3)

## 2011-10-08 LAB — LIPID PANEL
Cholesterol: 190 mg/dL (ref 0–200)
HDL: 58 mg/dL (ref >39)
LDL Cholesterol: 122 mg/dL — ABNORMAL HIGH (ref 0–99)
Total CHOL/HDL Ratio: 3.3 Ratio
Triglycerides: 50 mg/dL (ref <150)
VLDL: 10 mg/dL (ref 0–40)

## 2011-10-08 LAB — CBC WITH DIFFERENTIAL/PLATELET
Basophils Absolute: 0 10*3/uL (ref 0.0–0.1)
Basophils Relative: 1 % (ref 0–1)
Eosinophils Absolute: 0.1 10*3/uL (ref 0.0–0.7)
Eosinophils Relative: 2 % (ref 0–5)
HCT: 35.3 % — ABNORMAL LOW (ref 36.0–46.0)
Hemoglobin: 12.1 g/dL (ref 12.0–15.0)
Lymphocytes Relative: 37 % (ref 12–46)
Lymphs Abs: 1.8 10*3/uL (ref 0.7–4.0)
MCH: 30.5 pg (ref 26.0–34.0)
MCHC: 34.3 g/dL (ref 30.0–36.0)
MCV: 88.9 fL (ref 78.0–100.0)
Monocytes Absolute: 0.5 10*3/uL (ref 0.1–1.0)
Monocytes Relative: 10 % (ref 3–12)
Neutro Abs: 2.5 10*3/uL (ref 1.7–7.7)
Neutrophils Relative %: 50 % (ref 43–77)
Platelets: 308 10*3/uL (ref 150–400)
RBC: 3.97 MIL/uL (ref 3.87–5.11)
RDW: 12.7 % (ref 11.5–15.5)
WBC: 4.9 10*3/uL (ref 4.0–10.5)

## 2011-10-08 LAB — TSH: TSH: 1.857 u[IU]/mL (ref 0.350–4.500)

## 2011-10-09 ENCOUNTER — Encounter: Payer: Self-pay | Admitting: Internal Medicine

## 2011-10-09 ENCOUNTER — Ambulatory Visit (INDEPENDENT_AMBULATORY_CARE_PROVIDER_SITE_OTHER): Payer: 59 | Admitting: Internal Medicine

## 2011-10-09 VITALS — BP 116/72 | HR 64 | Temp 98.6°F | Ht 67.25 in | Wt 161.0 lb

## 2011-10-09 DIAGNOSIS — Z0000 Encounter for general adult medical examination without abnormal findings: Secondary | ICD-10-CM

## 2011-10-09 LAB — POCT URINALYSIS DIPSTICK
Bilirubin, UA: NEGATIVE
Blood, UA: NEGATIVE
Glucose, UA: NEGATIVE
Ketones, UA: NEGATIVE
Leukocytes, UA: NEGATIVE
Nitrite, UA: NEGATIVE
Protein, UA: NEGATIVE
Spec Grav, UA: 1.015
Urobilinogen, UA: NEGATIVE
pH, UA: 7

## 2011-10-09 LAB — VITAMIN D 25 HYDROXY (VIT D DEFICIENCY, FRACTURES): Vit D, 25-Hydroxy: 37 ng/mL (ref 30–89)

## 2011-10-11 NOTE — Patient Instructions (Addendum)
Return in one year or as needed. Watch diet.

## 2011-11-10 NOTE — Progress Notes (Signed)
  Subjective:    Patient ID: Donna [name], female    DOB: 12/31/1950, 61 y.o.   MRN: [id number]  HPI 61 year old white female with history of GE reflux, hyperlipidemia, allergic rhinitis, anxiety disorder in today for health maintenance exam. No known drug allergies but says Prozac as caused an adverse reaction in the past and Vicodin has caused nausea. Had mammogram April 2012. Colonoscopy in 2006 by Dr. Russella Dar. Right ankle surgery to repair fracture 1988, surgery for endometriosis 1998, left ankle surgery by Dr. Lestine Box November 2005. 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  Social history she is married, has had issues with alcohol consumption in the past. This is her second marriage. No children. She smoked in 2000 but has been able to quit.  Family history: Father has survived colon cancer but has history of coronary artery disease  Mother with history of diabetes mellitus one brother with history of drug addiction, one sister and one brother without significant health problems    Review of Systems  Constitutional: Negative.   All other systems reviewed and are negative.       Objective:   Physical Exam  Vitals reviewed. Constitutional: She is oriented to person, place, and time. She appears well-nourished. No distress.  HENT:  Head: Normocephalic and atraumatic.  Right Ear: External ear normal.  Left Ear: External ear normal.  Mouth/Throat: Oropharynx is clear and moist. No oropharyngeal exudate.  Eyes: Conjunctivae and EOM are normal. Pupils are equal, round, and reactive to light. Right eye exhibits no discharge. Left eye exhibits no discharge. No scleral icterus.  Neck: Normal range of motion. Neck supple. No JVD present. No thyromegaly present.  Cardiovascular: Normal rate, regular rhythm, normal heart sounds and intact distal pulses.   No murmur heard. Pulmonary/Chest: Effort  normal and breath sounds normal. No respiratory distress. She has no wheezes. She has no rales. She exhibits no tenderness.       Breasts normal female  Abdominal: Soft. Bowel sounds are normal. She exhibits no distension and no mass. There is no tenderness. There is no rebound and no guarding.  Genitourinary: Vagina normal and uterus normal.       Pap deferred last Pap 2012  Musculoskeletal: Normal range of motion. She exhibits no edema and no tenderness.  Lymphadenopathy:    She has no cervical adenopathy.  Neurological: She is alert and oriented to person, place, and time. She has normal reflexes. She displays normal reflexes. No cranial nerve deficit.  Skin: Skin is warm. No rash noted. She is not diaphoretic.  Psychiatric: She has a normal mood and affect. Her behavior is normal. Judgment and thought content normal.          Assessment & Plan:  History of GE reflux  History of hyperlipidemia  Allergic rhinitis  Anxiety disorder  Recovering alcoholic  Plan: Fasting labs reviewed with patient. Return one year or as needed. Recommend annual mammogram, tetanus immunization given in 2009. Should have repeat bone density study in the near future. Last one on file 2004.

## 2012-01-19 ENCOUNTER — Ambulatory Visit (INDEPENDENT_AMBULATORY_CARE_PROVIDER_SITE_OTHER): Payer: 59 | Admitting: Sports Medicine

## 2012-01-19 VITALS — BP 120/70 | Ht 68.0 in | Wt 149.0 lb

## 2012-01-19 DIAGNOSIS — M2021 Hallux rigidus, right foot: Secondary | ICD-10-CM

## 2012-01-19 DIAGNOSIS — M2020 Hallux rigidus, unspecified foot: Secondary | ICD-10-CM

## 2012-01-19 DIAGNOSIS — M545 Low back pain, unspecified: Secondary | ICD-10-CM

## 2012-01-19 NOTE — Assessment & Plan Note (Signed)
Improved ROM today on exam; will continue alternating custom orthotics for OTC cushioning

## 2012-01-19 NOTE — Progress Notes (Signed)
  [facility]  Patient name: Donna [name] MRN [id number]  Date of birth: December 12, 1950  CC & HPI:  Donna [name] is a 61 y.o. female presenting today for B low back pain.  Pain is located over bilateral SI joints R>L and radiates to her B hip bones.  No radicular symptoms down her leg, no weakness, no paresthesias.  Has been present for 2-3 weeks and worsened over the past 2-3 days.  She reports worse symptoms while sitting or from going from leaning over to standing up.  Lifting aggravates her pain.  Moving helps her pain; she is able to perform her everyday activities but is guarded in her movements  Hx of foot pain that has significantly improved with orthotics.  She is alternating her custom orthotics for other cushioned orthotics due to better improvement with her symptoms of plantar fasciitis and sesmoiditis  ROS:  No weakness, no falls, no trauma  Pertinent History Reviewed:  Medical & Surgical Hx:  Reviewed: Significant for hx of HLD, depression, EtOH abuse Medications: Reviewed & Updated - see associated section Social History: Reviewed - Significant for non-smoker  Objective Findings:  Vitals:  Filed Vitals:   01/19/12 1406  BP: 120/70    PE: GENERAL:  Adult  female. In no discomfort; no respiratory distress. BACK/HIP:  R SI joint tenderness, poor SI joint motion, R pseudoshort leg,  negative log roll, negative straight leg raise, strength 5/5 B in LE myotomes, hip abduction is 4/5 B,  Foot: B pes cavus some mild mid foot collapse with good preservation of transverse arch B; mild callus formation over the 5th MTP Good ROM at Hip joint bilat Tight FABER on left Standing Flexion Test: right            Seated Flexion/ASIS CompressionTest: right Leg Length screening: pseudo short R leg LUMBAR  L3, L4 Neutral Sidebent Left; rotated right  SACRUM  L on L sacral torsion  PELVIS  R anterior innomininant      Assessment & Plan:

## 2012-01-19 NOTE — Assessment & Plan Note (Addendum)
Pt provided stretching/strenthening program focused at [facility] and piriformis. We also emphasized core strength and work on abdominals Mobilization of SI joint performed today.  Pt tolerated well and reported improvement in her symptoms.   Will return prn.

## 2012-02-17 ENCOUNTER — Other Ambulatory Visit: Payer: Self-pay | Admitting: Physician Assistant

## 2012-02-18 ENCOUNTER — Ambulatory Visit (INDEPENDENT_AMBULATORY_CARE_PROVIDER_SITE_OTHER): Payer: 59 | Admitting: Sports Medicine

## 2012-02-18 ENCOUNTER — Ambulatory Visit
Admission: RE | Admit: 2012-02-18 | Discharge: 2012-02-18 | Disposition: A | Discharge status: Home / Self Care | Payer: 59 | Source: Ambulatory Visit | Attending: Sports Medicine | Admitting: Sports Medicine

## 2012-02-18 VITALS — BP 110/60 | Ht 68.0 in | Wt 149.0 lb

## 2012-02-18 DIAGNOSIS — M549 Dorsalgia, unspecified: Secondary | ICD-10-CM

## 2012-02-18 NOTE — Progress Notes (Signed)
  Subjective:    Patient ID: Donna [name], female    DOB: [birth year], 61 y.o.   MRN: [id number]  HPI Patient comes in today with persistent low back pain. She was last seen in the office on September 10 and diagnosed with bilateral SI joint dysfunction. SI joints were immobilized using osteopathic manipulation. This was carried out by Dr. Gaspar Bidding. He is given a home exercise program consisting of core strengthening but despite all of this her pain persists. She describes an aching pain diffuse across her low-back. Worse with activity particularly with standing. 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 The most comfortable position is actually a yoga pose using a rope which places her into a position of lumbar flexion and somewhat of an inverted V. she's tried traction but has not tried any other modalities. She denies any change in bowel bladder habits. No pain in her legs. No associated numbness or tingling. No groin pain. She's been taking intermittent doses of ibuprofen, but this has not been very helpful and elevates her blood pressure.  Interim medical history is unchanged from her last office visit. She is otherwise healthy. She is allergic to quinolones.    Review of Systems     Objective:   Physical Exam Well-developed, well-nourished. No acute distress. Awake alert and oriented x3  She has full lumbar mobility. Some pain with flexion but most of her pain is with extension. She has no tenderness to palpation or percussion along the lumbar midline. No real tenderness to palpation along the paraspinal musculature. No spasm. She is somewhat tender over the SI joints bilaterally. Negative straight leg raise bilaterally, negative log roll bilaterally. Her strength is 5/5 both lower extremities with reflexes 1/4 at the Achilles and patellar tendons bilaterally. Sensation is intact to light-touch distally. Good distal pulses.       Assessment & Plan:    1. Persistent low back pain-question lumbar degenerative disc disease versus facet arthropathy  I will order an AP and lateral x-ray of the lumbar spine and will call her after reviewing those films. Patient is a little concerned about possible compression fracture. She takes calcium and vitamin D, but has not had a recent bone density test. I'm not clinically suspicious of a compression fracture but we will rule this out with the x-rays. Nonetheless she understands that she needs to discuss bone density testing with her PCP. I've asked her to try a TENS unit as well as over-the-counter Tylenol. We'll delineate further treatment based on her x-ray findings. She will followup with me in 3 weeks.

## 2012-02-19 ENCOUNTER — Telehealth: Payer: Self-pay | Admitting: Sports Medicine

## 2012-02-19 NOTE — Telephone Encounter (Signed)
I spoke with Donna [name] today on the phone regarding x-rays of her lumbar spine. She has facet arthropathy on the left at L5-S1. No significant disc space narrowing. No signs of compression fracture. Today she state she is feeling pretty good. She had one of the physical therapist did some mobilization on her yesterday and today is feeling better. I've asked her to start with some abdominal core exercises but to avoid any exercise that puts her into lumbar extension. I also want her to go ahead and talk with her primary care physician about a bone density study. She will followup with me in 3 weeks as scheduled.

## 2012-03-08 ENCOUNTER — Ambulatory Visit (INDEPENDENT_AMBULATORY_CARE_PROVIDER_SITE_OTHER): Payer: 59 | Admitting: Sports Medicine

## 2012-03-08 VITALS — BP 112/70 | Ht 67.0 in | Wt 150.0 lb

## 2012-03-08 DIAGNOSIS — M479 Spondylosis, unspecified: Secondary | ICD-10-CM

## 2012-03-08 DIAGNOSIS — M47819 Spondylosis without myelopathy or radiculopathy, site unspecified: Secondary | ICD-10-CM

## 2012-03-08 DIAGNOSIS — M545 Low back pain, unspecified: Secondary | ICD-10-CM

## 2012-03-08 NOTE — Progress Notes (Signed)
  Subjective:    Patient ID: Donna [name], female    DOB: 02/15/1951, 61 y.o.   MRN: [id number]  HPI Patient comes in today for followup on low back pain. Pain is improving. Recent x-rays showed some facet arthropathy at the lower segment of her lumbar spine. Well-preserved disc spaces. Nothing acute. She's been very diligent about her core exercises and is trying to avoid extension. No radiating pain down her legs. No associated numbness or tingling.    Review of Systems     Objective:   Physical Exam Well-developed, well-nourished. No acute distress.  She has excellent lumbar mobility. No real pain today with forward flexion or extension. She is tender to palpation along the right L5-S1 facet as well as at the right SI joint. Negative straight leg raise bilaterally. Strength remains 5/5 both lower extremities and reflexes are 2/4 at the Achilles and patellar tendons bilaterally.  X-rays are as above       Assessment & Plan:  1. Improving low back pain with x-ray evidence of facet arthropathy  Patient will continue with core exercises. She is also aware of self SI joint immobilization which she can do when necessary on her. She is reassured that other than some mild facet arthropathy her x-rays show minimal degenerative changes. This point in time her symptoms are not significant enough to consider further treatment. We discussed the possibility of further diagnostic imaging and diagnostic/therapeutic facet injections in the future if her pain worsens. She'll followup with me when necessary.

## 2012-04-14 ENCOUNTER — Other Ambulatory Visit: Payer: 59 | Admitting: Internal Medicine

## 2012-04-14 DIAGNOSIS — E785 Hyperlipidemia, unspecified: Secondary | ICD-10-CM

## 2012-04-14 DIAGNOSIS — Z79899 Other long term (current) drug therapy: Secondary | ICD-10-CM

## 2012-04-14 DIAGNOSIS — M2550 Pain in unspecified joint: Secondary | ICD-10-CM

## 2012-04-14 LAB — LIPID PANEL
Cholesterol: 192 mg/dL (ref 0–200)
HDL: 73 mg/dL (ref >39)
LDL Cholesterol: 112 mg/dL — ABNORMAL HIGH (ref 0–99)
Total CHOL/HDL Ratio: 2.6 Ratio
Triglycerides: 36 mg/dL (ref <150)
VLDL: 7 mg/dL (ref 0–40)

## 2012-04-14 LAB — HEPATIC FUNCTION PANEL
ALT: 20 U/L (ref 0–35)
AST: 23 U/L (ref 0–37)
Albumin: 4.1 g/dL (ref 3.5–5.2)
Alkaline Phosphatase: 65 U/L (ref 39–117)
Bilirubin, Direct: 0.1 mg/dL (ref 0.0–0.3)
Indirect Bilirubin: 0.5 mg/dL (ref 0.0–0.9)
Total Bilirubin: 0.6 mg/dL (ref 0.3–1.2)
Total Protein: 6.4 g/dL (ref 6.0–8.3)

## 2012-04-15 ENCOUNTER — Ambulatory Visit (INDEPENDENT_AMBULATORY_CARE_PROVIDER_SITE_OTHER): Payer: 59 | Admitting: Internal Medicine

## 2012-04-15 ENCOUNTER — Encounter: Payer: Self-pay | Admitting: Internal Medicine

## 2012-04-15 VITALS — BP 116/72 | HR 64 | Temp 98.2°F | Wt 157.0 lb

## 2012-04-15 DIAGNOSIS — E785 Hyperlipidemia, unspecified: Secondary | ICD-10-CM

## 2012-04-15 LAB — SEDIMENTATION RATE: Sed Rate: 9 mm/hr (ref 0–22)

## 2012-04-15 LAB — CYCLIC CITRUL PEPTIDE ANTIBODY, IGG: Cyclic Citrullin Peptide Ab: <2 U/mL (ref 0.0–5.0)

## 2012-04-15 NOTE — Patient Instructions (Addendum)
Continue same regimen and return in 6 months for PE

## 2012-04-15 NOTE — Progress Notes (Signed)
  Subjective:    Patient ID: Donna [name], female    DOB: [birth year], 61 y.o.   MRN: [id number]  HPI  Pt here for six-month followup of hyperlipidemia. Recently had some arthralgias in her hands. Wanted to be checked for rheumatoid arthritis because of family history in her father. Sedimentation rate and CCP are within normal limits. Reassured patient she does not have rheumatoid arthritis but more likely osteoarthritis. 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 Patient says she has had adverse reaction to flu vaccine in the past and has a waiver not to take it.  No complaints or problems. Spirits are good. Remote history of alcohol abuse.    Review of Systems     Objective:   Physical Exam neck supple without thyromegaly; chest clear to auscultation; cardiac exam regular rate and rhythm; skin warm and dry; affect normal; extremities without edema        Assessment & Plan:  Hyperlipidemia-stable-- HDL has elevated significantly. LDL is stable at 112.  Remote history of alcohol abuse  Had arthralgias-likely osteoarthritis  Plan: Return in 6 months for physical exam. No change in medication regimen.

## 2012-05-03 ENCOUNTER — Encounter: Payer: Self-pay | Admitting: Internal Medicine

## 2012-05-24 ENCOUNTER — Other Ambulatory Visit: Payer: Self-pay | Admitting: Physician Assistant

## 2012-05-25 ENCOUNTER — Encounter: Payer: Self-pay | Admitting: Cardiovascular Disease

## 2012-05-25 ENCOUNTER — Ambulatory Visit (INDEPENDENT_AMBULATORY_CARE_PROVIDER_SITE_OTHER): Payer: 59 | Admitting: Cardiovascular Disease

## 2012-05-25 VITALS — BP 96/70 | HR 72 | Ht 68.0 in | Wt 156.0 lb

## 2012-05-25 DIAGNOSIS — R011 Cardiac murmur, unspecified: Secondary | ICD-10-CM | POA: Insufficient documentation

## 2012-05-25 DIAGNOSIS — R079 Chest pain, unspecified: Secondary | ICD-10-CM

## 2012-05-25 DIAGNOSIS — E785 Hyperlipidemia, unspecified: Secondary | ICD-10-CM

## 2012-05-25 NOTE — Progress Notes (Signed)
Patient ID: Donna [name], female   DOB: [birth year], 62 y.o.   MRN: [id number] 62 yo referred by Dr Lenord Fellers for chest pain  No previous history.  Indicates normal stress test many years ago.  In November and December had SSCP while hiking improved with rest.  Some chest pressure with exertion.  However she does indoor bike and yoga every morning and no pains.  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  No dyspnea palpitations No cough pleurisy or sputum No recent trauma.  ? Arthritis with negative RF and ESR.  Pain improved in January.  Pain is central no radiation.  Pressure quality.  Can lost minutes to hour  ROS: Denies fever, malais, weight loss, blurry vision, decreased visual acuity, cough, sputum, SOB, hemoptysis, pleuritic pain, palpitaitons, heartburn, abdominal pain, melena, lower extremity edema, claudication, or rash.  All other systems reviewed and negative   General: Affect appropriate Healthy:  appears stated age HEENT: normal Neck supple with no adenopathy JVP normal no bruits no thyromegaly Lungs clear with no wheezing and good diaphragmatic motion Heart:  S1/S2 1/6 SEM murmur,rub, gallop or click PMI normal Abdomen: benighn, BS positve, no tenderness, no AAA no bruit.  No HSM or HJR Distal pulses intact with no bruits No edema Neuro non-focal Skin warm and dry No muscular weakness  Medications Current Outpatient Prescriptions  Medication Sig Dispense Refill  . b complex vitamins tablet Take 1 tablet by mouth daily.        . calcium gluconate 500 MG tablet Take 500 mg by mouth daily.        Marland Kitchen estradiol (ESTRACE) 1 MG tablet Take 1 mg by mouth daily.        . Flaxseed, Linseed, (FLAX SEED OIL) 1000 MG CAPS Take 1 capsule by mouth daily.        . Magnesium 500 MG TABS Take 1 tablet by mouth daily.        . Multiple Vitamin (MULTIVITAMIN) capsule Take 1 capsule by mouth daily.        . Omega-3 Fatty Acids (OMEGA-3 FISH OIL) 1200 MG CAPS Take 1 capsule by mouth daily.        .  Progesterone (FIRST-PROGESTERONE VGS 50) 50 MG SUPP as directed.          Allergies Penicillins and Quinolones  Family History: Family History  Problem Relation Age of Onset  . Colon cancer Father 59    Survived  . Colon cancer Paternal Grandmother   . Colon polyps Paternal Aunt   . Diabetes Mother   . Heart disease Father     Social History: History   Social History  . Marital Status: Married    Spouse Name: N/A    Number of Children: 0  . Years of Education: N/A   Occupational History  . [employer]    Social History Main Topics  . Smoking status: Former Smoker -- 30 years    Types: Cigarettes  . Smokeless tobacco: Never Used  . Alcohol Use: Yes     Comment: 1 daily   . Drug Use: No  . Sexually Active: Not on file   Other Topics Concern  . Not on file   Social History Narrative   2 caffeine drinks daily    Electrocardiogram:  10/10/08 SR rate 62 normal  Assessment and Plan

## 2012-05-25 NOTE — Patient Instructions (Addendum)
Your physician has requested that you have an exercise tolerance test. For further information please visit [email or web address] Please also follow instruction sheet, as given.  Your physician wants you to follow-up in: 1 year with Dr. Eden Emms. You will receive a reminder letter in the mail two months in advance. If you don't receive a letter, please call our office to schedule the follow-up appointment.

## 2012-05-25 NOTE — Assessment & Plan Note (Signed)
Cholesterol is at goal.  Continue current dose of statin and diet Rx.  No myalgias or side effects.  F/U  LFT's in 6 months. Lab Results  Component Value Date   LDLCALC 112* 04/14/2012

## 2012-05-25 NOTE — Assessment & Plan Note (Signed)
Benign SEM no need for SBE or echo

## 2012-05-25 NOTE — Assessment & Plan Note (Signed)
Atypical with normal ECG F/U ETT  

## 2012-06-17 ENCOUNTER — Encounter: Payer: Self-pay | Admitting: Nurse Practitioner

## 2012-06-17 ENCOUNTER — Ambulatory Visit (INDEPENDENT_AMBULATORY_CARE_PROVIDER_SITE_OTHER): Payer: 59 | Admitting: Nurse Practitioner

## 2012-06-17 VITALS — BP 110/66 | HR 77

## 2012-06-17 DIAGNOSIS — R079 Chest pain, unspecified: Secondary | ICD-10-CM

## 2012-06-17 NOTE — Procedures (Signed)
Exercise Treadmill Test  Pre-Exercise Testing Evaluation Rhythm: sinus bradycardia  Rate: 57     Test  Exercise Tolerance Test Ordering MD: Charlton Haws, MD  Interpreting MD: Lockie Mola NP  Unique Test No: 1  Treadmill:  1  Indication for ETT: chest pain - rule out ischemia  Contraindication to ETT: No   Stress Modality: exercise - treadmill  Cardiac Imaging Performed: non   Protocol: standard Bruce - maximal  Max BP:  170/93  Max MPHR (bpm):  133 85% MPR (bpm):  NA  MPHR obtained (bpm):  9:38 % MPHR obtained:  83%  Reached 85% MPHR (min:sec):  NA Total Exercise Time (min-sec):  9:38  Workload in METS:  11.10 Borg Scale: 19  Reason ETT Terminated:  patient's desire to stop    ST Segment Analysis At Rest: normal ST segments - no evidence of significant ST depression With Exercise: no evidence of significant ST depression  Other Information Arrhythmia:  No Angina during ETT:  test stopped due to (2) Quality of ETT:  non-diagnostic  ETT Interpretation:  Non diagnostic GXT. Target not achieved.   Comments: Patient presents today for routine GXT. Has had some chest pain with exertion with inclines. Bikes and does yoga without issue.   Today, she exercised on the standard Bruce protocol for a total of 9:38. She has good exercise tolerance. BP response was adequate. Clinically she had complaints of chest burning during stage 2 that resolved in recovery. No EKG changes noted. Rare PVC noted. Target heart rate was not achieved. The test was stopped due to chest pain.   Recommendations: Stress Myoview to further define. If symptoms persist, may need cardiac cath to further define.   Patient is agreeable to this plan and will call if any problems develop in the interim.

## 2012-06-29 ENCOUNTER — Ambulatory Visit (HOSPITAL_COMMUNITY): Payer: 59 | Attending: Cardiology | Admitting: Radiology

## 2012-06-29 VITALS — Ht 68.0 in | Wt 154.0 lb

## 2012-06-29 DIAGNOSIS — I2510 Atherosclerotic heart disease of native coronary artery without angina pectoris: Secondary | ICD-10-CM | POA: Insufficient documentation

## 2012-06-29 DIAGNOSIS — R002 Palpitations: Secondary | ICD-10-CM | POA: Insufficient documentation

## 2012-06-29 DIAGNOSIS — Z8249 Family history of ischemic heart disease and other diseases of the circulatory system: Secondary | ICD-10-CM | POA: Insufficient documentation

## 2012-06-29 DIAGNOSIS — Z87891 Personal history of nicotine dependence: Secondary | ICD-10-CM | POA: Insufficient documentation

## 2012-06-29 DIAGNOSIS — R079 Chest pain, unspecified: Secondary | ICD-10-CM

## 2012-06-29 DIAGNOSIS — R0789 Other chest pain: Secondary | ICD-10-CM | POA: Insufficient documentation

## 2012-06-29 DIAGNOSIS — R5381 Other malaise: Secondary | ICD-10-CM | POA: Insufficient documentation

## 2012-06-29 DIAGNOSIS — E785 Hyperlipidemia, unspecified: Secondary | ICD-10-CM | POA: Insufficient documentation

## 2012-06-29 MED ORDER — TECHNETIUM TC 99M SESTAMIBI GENERIC - CARDIOLITE
10.0000 | Freq: Once | INTRAVENOUS | Status: AC | PRN
  Administered 2012-06-29: 10 via INTRAVENOUS

## 2012-06-29 MED ORDER — TECHNETIUM TC 99M SESTAMIBI GENERIC - CARDIOLITE
30.0000 | Freq: Once | INTRAVENOUS | Status: AC | PRN
  Administered 2012-06-29: 30 via INTRAVENOUS

## 2012-06-29 NOTE — Progress Notes (Signed)
[facility] SITE 3 NUCLEAR MED [address] [place], [place] [address] [phone]    Cardiology Nuclear Med Study  AKYLA VAVREK is a 62 y.o. female     MRN : [id number]     DOB: 08-28-1950  Procedure Date: 06/29/2012  Nuclear Med Background Indication for Stress Test:  Evaluation for Ischemia, and 06-17-12 GXT : Non-Diagnostic due to unable to reach target heartrate History: No prior known history of CAD, 12-25-08 Myocardial Perfusion Study-Normal, EF=57%, and 06-17-12 GXT: Non Diagnostic due to unable to reach target heartrate, good exercise tolerance Cardiac Risk Factors: Family History - CAD, History of Smoking and Lipids  Symptoms:  Chest Pain/Pressure with Exertion and Palpitations   Nuclear Pre-Procedure Caffeine/Decaff Intake:  None > 12 hrs NPO After: 8:30pm   Lungs:  clear O2 Sat: 98% on room air. IV 0.9% NS with Angio Cath:  20g  IV Site: L Antecubital x 1, tolerated well IV Started by:  Irean Hong, RN  Chest Size (in):  38 Cup Size: DDD  Height: 5\' 8"  (1.727 m)  Weight:  154 lb (69.854 kg)  BMI:  Body mass index is 23.42 kg/(m^2). Tech Comments:  No medications today    Nuclear Med Study 1 or 2 day study: 1 day  Stress Test Type:  Stress  Reading MD: Olga Millers, MD  Order Authorizing Provider:  Charlton Haws, MD, and Norma Fredrickson, NP  Resting Radionuclide: Technetium 44m Sestamibi  Resting Radionuclide Dose: 11.0 mCi   Stress Radionuclide:  Technetium 13m Sestamibi  Stress Radionuclide Dose: 33.0 mCi           Stress Protocol Rest HR: 52 Stress HR: 136  Rest BP: 92/69 Stress BP: 162/84  Exercise Time (min): 12:00 METS: 13.4   Predicted Max HR: 160 bpm % Max HR: 85 bpm Rate Pressure Product: 95284   Dose of Adenosine (mg):  n/a Dose of Lexiscan: n/a mg  Dose of Atropine (mg): n/a Dose of Dobutamine: n/a mcg/kg/min (at max HR)  Stress Test Technologist: Milana Na, EMT-P  Nuclear Technologist:  Domenic Polite, CNMT     Rest  Procedure:  Myocardial perfusion imaging was performed at rest 45 minutes following the intravenous administration of Technetium 61m Sestamibi. Rest ECG: Sinus bradycardia.  Stress Procedure:  The patient exercised on the treadmill utilizing the Bruce Protocol for 12 minutes. The patient stopped due to fatigue and chest burning 1/10 @ peak exercise.Technetium 51m Sestamibi was injected at peak exercise and myocardial perfusion imaging was performed after a brief delay. Stress ECG: No significant ST segment change suggestive of ischemia.  QPS Raw Data Images:  Acquisition technically good; normal left ventricular size. Stress Images:  Normal homogeneous uptake in all areas of the myocardium. Rest Images:  Normal homogeneous uptake in all areas of the myocardium. Subtraction (SDS):  No evidence of ischemia. Transient Ischemic Dilatation (Normal <1.22):  0.96 Lung/Heart Ratio (Normal <0.45):  0.19  Quantitative Gated Spect Images QGS EDV:  93 ml QGS ESV:  40 ml  Impression Exercise Capacity:  Excellent exercise capacity. BP Response:  Normal blood pressure response. Clinical Symptoms:  There is chest burning ECG Impression:  No significant ST segment change suggestive of ischemia. Comparison with Prior Nuclear Study: No images to compare  Overall Impression:  Normal stress nuclear study; ? nodule left breast; suggest mammogram and clinical correlation.  LV Ejection Fraction: 57%.  LV Wall Motion:  NL LV Function; NL Wall Motion   .Olga Millers

## 2012-08-24 ENCOUNTER — Other Ambulatory Visit: Payer: Self-pay

## 2012-10-15 DIAGNOSIS — I10 Essential (primary) hypertension: Secondary | ICD-10-CM | POA: Insufficient documentation

## 2012-10-20 ENCOUNTER — Other Ambulatory Visit: Payer: Self-pay | Admitting: Internal Medicine

## 2012-10-20 ENCOUNTER — Other Ambulatory Visit: Payer: Self-pay

## 2012-10-20 ENCOUNTER — Other Ambulatory Visit: Payer: 59 | Admitting: Internal Medicine

## 2012-10-20 DIAGNOSIS — E785 Hyperlipidemia, unspecified: Secondary | ICD-10-CM

## 2012-10-20 DIAGNOSIS — Z79899 Other long term (current) drug therapy: Secondary | ICD-10-CM

## 2012-10-20 LAB — LIPID PANEL
Cholesterol: 198 mg/dL (ref 0–200)
HDL: 71 mg/dL (ref >39)
LDL Cholesterol: 120 mg/dL — ABNORMAL HIGH (ref 0–99)
Total CHOL/HDL Ratio: 2.8 Ratio
Triglycerides: 35 mg/dL (ref <150)
VLDL: 7 mg/dL (ref 0–40)

## 2012-10-20 LAB — HEPATIC FUNCTION PANEL
ALT: 19 U/L (ref 0–35)
AST: 19 U/L (ref 0–37)
Albumin: 4.1 g/dL (ref 3.5–5.2)
Alkaline Phosphatase: 65 U/L (ref 39–117)
Bilirubin, Direct: 0.1 mg/dL (ref 0.0–0.3)
Indirect Bilirubin: 0.4 mg/dL (ref 0.0–0.9)
Total Bilirubin: 0.5 mg/dL (ref 0.3–1.2)
Total Protein: 6.3 g/dL (ref 6.0–8.3)

## 2012-10-21 ENCOUNTER — Ambulatory Visit (INDEPENDENT_AMBULATORY_CARE_PROVIDER_SITE_OTHER): Payer: 59 | Admitting: Internal Medicine

## 2012-10-21 ENCOUNTER — Encounter: Payer: Self-pay | Admitting: Internal Medicine

## 2012-10-21 VITALS — BP 100/68 | HR 86 | Temp 98.1°F | Wt 152.0 lb

## 2012-10-21 DIAGNOSIS — E7800 Pure hypercholesterolemia, unspecified: Secondary | ICD-10-CM

## 2012-11-06 ENCOUNTER — Emergency Department (INDEPENDENT_AMBULATORY_CARE_PROVIDER_SITE_OTHER): Payer: 59

## 2012-11-06 ENCOUNTER — Emergency Department
Admission: EM | Admit: 2012-11-06 | Discharge: 2012-11-06 | Disposition: A | Discharge status: Home / Self Care | Payer: 59 | Source: Home / Self Care | Attending: Family Medicine | Admitting: Family Medicine

## 2012-11-06 ENCOUNTER — Encounter: Payer: Self-pay | Admitting: Internal Medicine

## 2012-11-06 DIAGNOSIS — S161XXA Strain of muscle, fascia and tendon at neck level, initial encounter: Secondary | ICD-10-CM

## 2012-11-06 DIAGNOSIS — M47812 Spondylosis without myelopathy or radiculopathy, cervical region: Secondary | ICD-10-CM

## 2012-11-06 DIAGNOSIS — S139XXA Sprain of joints and ligaments of unspecified parts of neck, initial encounter: Secondary | ICD-10-CM

## 2012-11-06 MED ORDER — MELOXICAM 15 MG PO TABS: 15.0000 mg | ORAL_TABLET | Freq: Every day | ORAL | Status: DC

## 2012-11-06 MED ORDER — KETOROLAC TROMETHAMINE 30 MG/ML IJ SOLN
30.0000 mg | Freq: Once | INTRAMUSCULAR | Status: AC
  Administered 2012-11-06: 30 mg via INTRAMUSCULAR

## 2012-11-06 MED ORDER — CYCLOBENZAPRINE HCL 10 MG PO TABS: 10.0000 mg | ORAL_TABLET | Freq: Three times a day (TID) | ORAL | Status: DC | PRN

## 2012-11-06 NOTE — ED Provider Notes (Signed)
History    CSN: [id number] Arrival date & time 11/06/12  1424  First MD Initiated Contact with Patient 11/06/12 1503     Chief Complaint  Patient presents with  . 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  Pt was bent over  When pt when to move her neck to opposite, developed neck pain and difficulty with movement. No numbness or paresthesias.  Pain has been fairly constant since incident  No prior hx/o neck injury or neck trauma.  Pain predominantly L sided.  Worse with lateral and forward neck movement.   Past Medical History  Diagnosis Date  . Tubular adenoma of colon 02/2010  . Diverticula of colon   . Erosive esophagitis   . GERD (gastroesophageal reflux disease)   . IBS (irritable bowel syndrome)   . Hyperlipidemia   . Hiatal hernia   . Arthritis   . Allergic rhinitis   . Anxiety   . Chronic headaches   . Hypertension    Past Surgical History  Procedure Laterality Date  . Tubal ligation    . Ankle surgery      Bilateral    Family History  Problem Relation Age of Onset  . Colon cancer Father 29    Survived  . Heart disease Father   . Heart attack Father   . Colon cancer Paternal Grandmother   . Colon polyps Paternal Aunt   . Diabetes Mother    History  Substance Use Topics  . Smoking status: Former Smoker -- 30 years    Types: Cigarettes  . Smokeless tobacco: Never Used  . Alcohol Use: Yes     Comment: 1 daily    OB History   Grav Para Term Preterm Abortions TAB SAB Ect Mult Living                 Review of Systems  All other systems reviewed and are negative.    Allergies  Penicillins and Quinolones  Home Medications   Current Outpatient Rx  Name  Route  Sig  Dispense  Refill  . b complex vitamins tablet   Oral   Take 1 tablet by mouth daily.           . calcium gluconate 500 MG tablet   Oral   Take 500 mg by mouth daily.           Marland Kitchen estradiol  (ESTRACE) 1 MG tablet   Oral   Take 1 mg by mouth daily.           . Flaxseed, Linseed, (FLAX SEED OIL) 1000 MG CAPS   Oral   Take 1 capsule by mouth daily.           . Magnesium 500 MG TABS   Oral   Take 1 tablet by mouth daily.           . Multiple Vitamin (MULTIVITAMIN) capsule   Oral   Take 1 capsule by mouth daily.           . NONFORMULARY OR COMPOUNDED ITEM      Estradiol/testosterone 0.25-0.25mg /ml cream         . Omega-3 Fatty Acids (OMEGA-3 FISH OIL) 1200 MG CAPS   Oral   Take 1 capsule by mouth daily.           . Progesterone (FIRST-PROGESTERONE VGS 50) 50 MG SUPP  as directed.            BP 107/66  Pulse 68  Temp(Src) 98.5 F (36.9 C) (Oral)  Ht 5' 7.5" (1.715 m)  Wt 154 lb 8 oz (70.081 kg)  BMI 23.83 kg/m2  SpO2 98% Physical Exam  Constitutional: She appears well-developed and well-nourished.  HENT:  Head: Normocephalic and atraumatic.  Eyes: Conjunctivae are normal. Pupils are equal, round, and reactive to light.  Neck:    Decreased lateral neck ROM 2/2 pain  Spurling negative + TTP over affected area     Cardiovascular: Normal rate and regular rhythm.   Pulmonary/Chest: Effort normal.  Abdominal: Soft.  Musculoskeletal: Normal range of motion.  Neurological: She is alert.  Skin: Skin is warm.    ED Course  Procedures (including critical care time) Labs Reviewed - No data to display Dg Cervical Spine 2-3 Views  11/06/2012   *RADIOLOGY REPORT*  Clinical Data: Neck pain when turning the head to the left to the right for the last 3 days.  CERVICAL SPINE - 2-3 VIEW  Comparison: None.  Findings: There is reversal in the usual curvature of the cervical spine.  Normal vertebral body alignment without evidence of fractures or bone destruction.  Mild intervertebral disc space narrowing at C5-6 and C6-7.  Mild facet joint disease at these same levels.  The caliber of the canal is normal.  There are no soft tissue abnormalities.   IMPRESSION: No evidence of trauma or bone destruction.  Mild degenerative changes of the mid cervical spine.  Slight straightening of the cervical spine curvature may be positional or related to muscle spasm.   Original Report Authenticated By: Sander Radon, M.D.   1. Cervical strain, acute, initial encounter     MDM  xrays negative for any fracture, dislocation, or loss of disc space height. Will place on course of mobic and flexeril toradol 30 mg IM x1 Discussed general care and MSK red flags.  Follow up as needed.     The patient and/or caregiver has been counseled thoroughly with regard to treatment plan and/or medications prescribed including dosage, schedule, interactions, rationale for use, and possible side effects and they verbalize understanding. Diagnoses and expected course of recovery discussed and will return if not improved as expected or if the condition worsens. Patient and/or caregiver verbalized understanding.       Doree Albee, MD 11/06/12 1549

## 2012-11-06 NOTE — ED Notes (Signed)
States on Thursday she was working with a pt and her neck was in one position for a while and when she tried to turn her head, she was unable to.

## 2012-11-06 NOTE — Progress Notes (Signed)
  Subjective:    Patient ID: Donna [name], female    DOB: 04/26/1951, 62 y.o.   MRN: [id number]  HPI Patient in today to followup on elevated LDL cholesterol. She does not want to be on medication. Is trying to control lipids with diet and exercise. 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    Review of Systems     Objective:   Physical Exam skin is warm and dry. Nodes none. Neck is supple without JVD thyromegaly or carotid bruits. Chest clear to auscultation. Cardiac exam regular rate and rhythm normal S1 and S2. Extremities without edema.  LDL cholesterol is 120 and previously was 112 in December 2013. HDL cholesterol is 71 and previously was 73 and December 2o13        Assessment & Plan:  Elevated LDL cholesterol  Plan: Continue with diet exercise and return for physical exam in 6 months.

## 2012-11-06 NOTE — Patient Instructions (Addendum)
Continue diet exercise and return in 6 months for physical exam. 

## 2013-02-15 ENCOUNTER — Other Ambulatory Visit: Payer: Self-pay

## 2013-02-15 MED ORDER — NONFORMULARY OR COMPOUNDED ITEM: Status: DC

## 2013-03-14 ENCOUNTER — Encounter: Payer: Self-pay | Admitting: Internal Medicine

## 2013-03-16 ENCOUNTER — Other Ambulatory Visit: Payer: Self-pay

## 2013-04-20 ENCOUNTER — Other Ambulatory Visit: Payer: 59 | Admitting: Internal Medicine

## 2013-04-20 DIAGNOSIS — Z130 Encounter for screening for diseases of the blood and blood-forming organs and certain disorders involving the immune mechanism: Secondary | ICD-10-CM

## 2013-04-20 DIAGNOSIS — R0989 Other specified symptoms and signs involving the circulatory and respiratory systems: Secondary | ICD-10-CM

## 2013-04-20 DIAGNOSIS — E7800 Pure hypercholesterolemia, unspecified: Secondary | ICD-10-CM

## 2013-04-20 DIAGNOSIS — Z8659 Personal history of other mental and behavioral disorders: Secondary | ICD-10-CM

## 2013-04-20 DIAGNOSIS — Z0000 Encounter for general adult medical examination without abnormal findings: Secondary | ICD-10-CM

## 2013-04-20 DIAGNOSIS — Z1329 Encounter for screening for other suspected endocrine disorder: Secondary | ICD-10-CM

## 2013-04-20 LAB — CBC WITH DIFFERENTIAL/PLATELET
Basophils Absolute: 0 10*3/uL (ref 0.0–0.1)
Basophils Relative: 1 % (ref 0–1)
Eosinophils Absolute: 0.1 10*3/uL (ref 0.0–0.7)
Eosinophils Relative: 3 % (ref 0–5)
HCT: 35.9 % — ABNORMAL LOW (ref 36.0–46.0)
Hemoglobin: 12.6 g/dL (ref 12.0–15.0)
Lymphocytes Relative: 34 % (ref 12–46)
Lymphs Abs: 1.5 10*3/uL (ref 0.7–4.0)
MCH: 30.1 pg (ref 26.0–34.0)
MCHC: 35.1 g/dL (ref 30.0–36.0)
MCV: 85.7 fL (ref 78.0–100.0)
Monocytes Absolute: 0.4 10*3/uL (ref 0.1–1.0)
Monocytes Relative: 9 % (ref 3–12)
Neutro Abs: 2.3 10*3/uL (ref 1.7–7.7)
Neutrophils Relative %: 53 % (ref 43–77)
Platelets: 293 10*3/uL (ref 150–400)
RBC: 4.19 MIL/uL (ref 3.87–5.11)
RDW: 13.6 % (ref 11.5–15.5)
WBC: 4.4 10*3/uL (ref 4.0–10.5)

## 2013-04-20 LAB — COMPREHENSIVE METABOLIC PANEL
ALT: 17 U/L (ref 0–35)
AST: 16 U/L (ref 0–37)
Albumin: 3.8 g/dL (ref 3.5–5.2)
Alkaline Phosphatase: 64 U/L (ref 39–117)
BUN: 14 mg/dL (ref 6–23)
CO2: 28 mEq/L (ref 19–32)
Calcium: 9 mg/dL (ref 8.4–10.5)
Chloride: 101 mEq/L (ref 96–112)
Creat: 0.79 mg/dL (ref 0.50–1.10)
Glucose, Bld: 87 mg/dL (ref 70–99)
Potassium: 4.4 mEq/L (ref 3.5–5.3)
Sodium: 136 mEq/L (ref 135–145)
Total Bilirubin: 0.6 mg/dL (ref 0.3–1.2)
Total Protein: 6.2 g/dL (ref 6.0–8.3)

## 2013-04-20 LAB — TSH: TSH: 2.139 u[IU]/mL (ref 0.350–4.500)

## 2013-04-20 LAB — LIPID PANEL
Cholesterol: 202 mg/dL — ABNORMAL HIGH (ref 0–200)
HDL: 65 mg/dL (ref >39)
LDL Cholesterol: 128 mg/dL — ABNORMAL HIGH (ref 0–99)
Total CHOL/HDL Ratio: 3.1 Ratio
Triglycerides: 43 mg/dL (ref <150)
VLDL: 9 mg/dL (ref 0–40)

## 2013-04-21 ENCOUNTER — Other Ambulatory Visit (HOSPITAL_COMMUNITY)
Admission: RE | Admit: 2013-04-21 | Discharge: 2013-04-21 | Disposition: A | Discharge status: Home / Self Care | Payer: 59 | Source: Ambulatory Visit | Attending: Internal Medicine | Admitting: Internal Medicine

## 2013-04-21 ENCOUNTER — Encounter: Payer: Self-pay | Admitting: Internal Medicine

## 2013-04-21 ENCOUNTER — Ambulatory Visit (INDEPENDENT_AMBULATORY_CARE_PROVIDER_SITE_OTHER): Payer: 59 | Admitting: Internal Medicine

## 2013-04-21 VITALS — BP 120/78 | HR 72 | Temp 98.1°F | Ht 67.0 in | Wt 156.0 lb

## 2013-04-21 DIAGNOSIS — Z0000 Encounter for general adult medical examination without abnormal findings: Secondary | ICD-10-CM

## 2013-04-21 DIAGNOSIS — K219 Gastro-esophageal reflux disease without esophagitis: Secondary | ICD-10-CM

## 2013-04-21 DIAGNOSIS — Z01419 Encounter for gynecological examination (general) (routine) without abnormal findings: Secondary | ICD-10-CM | POA: Insufficient documentation

## 2013-04-21 DIAGNOSIS — J309 Allergic rhinitis, unspecified: Secondary | ICD-10-CM

## 2013-04-21 DIAGNOSIS — F411 Generalized anxiety disorder: Secondary | ICD-10-CM

## 2013-04-21 DIAGNOSIS — E7800 Pure hypercholesterolemia, unspecified: Secondary | ICD-10-CM

## 2013-04-21 DIAGNOSIS — F1021 Alcohol dependence, in remission: Secondary | ICD-10-CM

## 2013-04-21 DIAGNOSIS — E785 Hyperlipidemia, unspecified: Secondary | ICD-10-CM

## 2013-04-21 LAB — POCT URINALYSIS DIPSTICK
Bilirubin, UA: NEGATIVE
Blood, UA: NEGATIVE
Glucose, UA: NEGATIVE
Ketones, UA: NEGATIVE
Leukocytes, UA: NEGATIVE
Nitrite, UA: NEGATIVE
Protein, UA: NEGATIVE
Spec Grav, UA: 1.015
Urobilinogen, UA: 0.2
pH, UA: 6

## 2013-04-21 LAB — VITAMIN D 25 HYDROXY (VIT D DEFICIENCY, FRACTURES): Vit D, 25-Hydroxy: 53 ng/mL (ref 30–89)

## 2013-04-21 NOTE — Patient Instructions (Addendum)
Return in one year. Watch diet. Continue to exercise.

## 2013-07-19 ENCOUNTER — Encounter: Payer: Self-pay | Admitting: Cardiovascular Disease

## 2013-09-01 ENCOUNTER — Ambulatory Visit: Payer: 59 | Admitting: Cardiovascular Disease

## 2013-10-14 ENCOUNTER — Encounter: Payer: Self-pay | Admitting: Internal Medicine

## 2013-10-14 DIAGNOSIS — E7800 Pure hypercholesterolemia, unspecified: Secondary | ICD-10-CM | POA: Insufficient documentation

## 2013-10-14 DIAGNOSIS — F1021 Alcohol dependence, in remission: Secondary | ICD-10-CM | POA: Insufficient documentation

## 2013-10-14 DIAGNOSIS — F4322 Adjustment disorder with anxiety: Secondary | ICD-10-CM | POA: Insufficient documentation

## 2013-10-14 DIAGNOSIS — F411 Generalized anxiety disorder: Secondary | ICD-10-CM | POA: Insufficient documentation

## 2013-10-14 DIAGNOSIS — K219 Gastro-esophageal reflux disease without esophagitis: Secondary | ICD-10-CM | POA: Insufficient documentation

## 2013-10-14 DIAGNOSIS — J309 Allergic rhinitis, unspecified: Secondary | ICD-10-CM | POA: Insufficient documentation

## 2013-10-14 DIAGNOSIS — F1011 Alcohol abuse, in remission: Secondary | ICD-10-CM | POA: Insufficient documentation

## 2013-10-14 NOTE — Progress Notes (Signed)
   Subjective:    Patient ID: Donna [name], female    DOB: 1951/05/04, 63 y.o.   MRN: [id number]  HPI  63 year old female with history of GE reflux, hyperlipidemia, allergic rhinitis, anxiety disorder for health maintenance exam.  No known drug allergies but Prozac has caused an adverse reaction in the past and that Vicodin causes nausea.  Colonoscopy in 2006 by Dr. Fuller Plan.  Past medical history: Right ankle surgery to repair a fracture in 1988, surgery for endometriosis 1998, left ankle surgery by Dr. Beola Cord November 2005.  Social history: She is married. Has had issues with alcohol consumption in the past. This is her second marriage. No children. She smoked previously but has been able to quit. 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  Family history: Father has survived colon cancer but has history of coronary artery disease. Mother with history of diabetes mellitus. One brother with history of drug addiction. One sister and one brother without significant health problems.    Review of Systems  Constitutional: Negative.   All other systems reviewed and are negative.      Objective:   Physical Exam  Vitals reviewed. Constitutional: She is oriented to person, place, and time. She appears well-developed and well-nourished. No distress.  HENT:  Head: Normocephalic and atraumatic.  Right Ear: External ear normal.  Left Ear: External ear normal.  Mouth/Throat: Oropharynx is clear and moist. No oropharyngeal exudate.  Eyes: Conjunctivae and EOM are normal. Pupils are equal, round, and reactive to light. Right eye exhibits no discharge. Left eye exhibits no discharge. No scleral icterus.  Neck: Neck supple. No JVD present. No thyromegaly present.  Cardiovascular: Normal rate, regular rhythm, normal heart sounds and intact distal pulses.   No murmur heard. Pulmonary/Chest:  Effort normal and breath sounds normal. No respiratory distress. She has no rales. She exhibits no tenderness.  Breasts normal female without masses  Abdominal: Soft. Bowel sounds are normal. She exhibits no distension and no mass. There is no tenderness. There is no rebound and no guarding.  Genitourinary:  Pap taken.  Bimanual normal  Musculoskeletal: Normal range of motion. She exhibits no edema.  Lymphadenopathy:    She has no cervical adenopathy.  Neurological: She is alert and oriented to person, place, and time. She has normal reflexes. No cranial nerve deficit. Coordination normal.  Skin: Skin is warm and dry. No rash noted. She is not diaphoretic.  Psychiatric: She has a normal mood and affect. Her behavior is normal. Judgment and thought content normal.          Assessment & Plan:  History of GE reflux  History of hyperlipidemia  Allergic rhinitis  Anxiety disorder  Recovering alcoholic  Plan: Return in one year or as needed. Recommend annual mammogram. Patient had tetanus immunization in 2009. She should have bone density study.

## 2013-12-05 ENCOUNTER — Other Ambulatory Visit: Payer: Self-pay | Admitting: Physician Assistant

## 2014-01-30 ENCOUNTER — Telehealth: Payer: Self-pay

## 2014-01-30 MED ORDER — NONFORMULARY OR COMPOUNDED ITEM: Status: DC

## 2014-01-30 NOTE — Telephone Encounter (Signed)
Refill request for compound estriol/testosteron.  Please advise.

## 2014-01-30 NOTE — Telephone Encounter (Signed)
Estriol/testosterone compound called into [facility].  Patient aware.

## 2014-01-30 NOTE — Telephone Encounter (Signed)
[facility] and tell them to "renew her hormone replacement" I did this by e-scribe today

## 2014-02-06 ENCOUNTER — Ambulatory Visit: Payer: 59 | Admitting: Internal Medicine

## 2014-02-26 ENCOUNTER — Encounter: Payer: Self-pay | Admitting: Internal Medicine

## 2014-04-26 ENCOUNTER — Other Ambulatory Visit: Payer: BC Managed Care – PPO | Admitting: Internal Medicine

## 2014-04-26 DIAGNOSIS — Z130 Encounter for screening for diseases of the blood and blood-forming organs and certain disorders involving the immune mechanism: Secondary | ICD-10-CM

## 2014-04-26 DIAGNOSIS — Z13220 Encounter for screening for lipoid disorders: Secondary | ICD-10-CM

## 2014-04-26 DIAGNOSIS — E785 Hyperlipidemia, unspecified: Secondary | ICD-10-CM

## 2014-04-26 DIAGNOSIS — Z0000 Encounter for general adult medical examination without abnormal findings: Secondary | ICD-10-CM

## 2014-04-26 DIAGNOSIS — Z1321 Encounter for screening for nutritional disorder: Secondary | ICD-10-CM

## 2014-04-26 DIAGNOSIS — Z1329 Encounter for screening for other suspected endocrine disorder: Secondary | ICD-10-CM

## 2014-04-26 LAB — COMPREHENSIVE METABOLIC PANEL
ALT: 18 U/L (ref 0–35)
AST: 17 U/L (ref 0–37)
Albumin: 4 g/dL (ref 3.5–5.2)
Alkaline Phosphatase: 64 U/L (ref 39–117)
BUN: 7 mg/dL (ref 6–23)
CO2: 24 mEq/L (ref 19–32)
Calcium: 9.1 mg/dL (ref 8.4–10.5)
Chloride: 101 mEq/L (ref 96–112)
Creat: 0.75 mg/dL (ref 0.50–1.10)
Glucose, Bld: 92 mg/dL (ref 70–99)
Potassium: 4.1 mEq/L (ref 3.5–5.3)
Sodium: 134 mEq/L — ABNORMAL LOW (ref 135–145)
Total Bilirubin: 0.7 mg/dL (ref 0.2–1.2)
Total Protein: 6.4 g/dL (ref 6.0–8.3)

## 2014-04-26 LAB — CBC WITH DIFFERENTIAL/PLATELET
Basophils Absolute: 0.1 10*3/uL (ref 0.0–0.1)
Basophils Relative: 1 % (ref 0–1)
Eosinophils Absolute: 0.1 10*3/uL (ref 0.0–0.7)
Eosinophils Relative: 2 % (ref 0–5)
HCT: 36.5 % (ref 36.0–46.0)
Hemoglobin: 12.6 g/dL (ref 12.0–15.0)
Lymphocytes Relative: 27 % (ref 12–46)
Lymphs Abs: 1.5 10*3/uL (ref 0.7–4.0)
MCH: 29.9 pg (ref 26.0–34.0)
MCHC: 34.5 g/dL (ref 30.0–36.0)
MCV: 86.5 fL (ref 78.0–100.0)
MPV: 9.2 fL — ABNORMAL LOW (ref 9.4–12.4)
Monocytes Absolute: 0.4 10*3/uL (ref 0.1–1.0)
Monocytes Relative: 8 % (ref 3–12)
Neutro Abs: 3.3 10*3/uL (ref 1.7–7.7)
Neutrophils Relative %: 62 % (ref 43–77)
Platelets: 280 10*3/uL (ref 150–400)
RBC: 4.22 MIL/uL (ref 3.87–5.11)
RDW: 13.5 % (ref 11.5–15.5)
WBC: 5.4 10*3/uL (ref 4.0–10.5)

## 2014-04-26 LAB — LIPID PANEL
Cholesterol: 247 mg/dL — ABNORMAL HIGH (ref 0–200)
HDL: 81 mg/dL (ref >39)
LDL Cholesterol: 153 mg/dL — ABNORMAL HIGH (ref 0–99)
Total CHOL/HDL Ratio: 3 Ratio
Triglycerides: 63 mg/dL (ref <150)
VLDL: 13 mg/dL (ref 0–40)

## 2014-04-26 LAB — HEMOGLOBIN A1C
Hgb A1c MFr Bld: 5.3 % (ref <5.7)
Mean Plasma Glucose: 105 mg/dL (ref <117)

## 2014-04-26 LAB — TSH: TSH: 1.91 u[IU]/mL (ref 0.350–4.500)

## 2014-04-27 ENCOUNTER — Ambulatory Visit (INDEPENDENT_AMBULATORY_CARE_PROVIDER_SITE_OTHER): Payer: BC Managed Care – PPO | Admitting: Internal Medicine

## 2014-04-27 ENCOUNTER — Encounter: Payer: Self-pay | Admitting: Internal Medicine

## 2014-04-27 VITALS — BP 132/74 | HR 70 | Temp 97.6°F | Ht 67.0 in | Wt 156.0 lb

## 2014-04-27 DIAGNOSIS — Z0000 Encounter for general adult medical examination without abnormal findings: Secondary | ICD-10-CM

## 2014-04-27 LAB — POCT URINALYSIS DIPSTICK
Bilirubin, UA: NEGATIVE
Blood, UA: NEGATIVE
Glucose, UA: NEGATIVE
Ketones, UA: NEGATIVE
Leukocytes, UA: NEGATIVE
Nitrite, UA: NEGATIVE
Protein, UA: NEGATIVE
Spec Grav, UA: 1.01
Urobilinogen, UA: NEGATIVE
pH, UA: 6.5

## 2014-04-27 LAB — VITAMIN D 25 HYDROXY (VIT D DEFICIENCY, FRACTURES): Vit D, 25-Hydroxy: 49 ng/mL (ref 30–100)

## 2014-04-27 NOTE — Progress Notes (Signed)
   Subjective:    Patient ID: Donna [name], female    DOB: [birth year], 63 y.o.   MRN: [id number]  HPI 63 year old white female in today for health maintenance exam and evaluation of medical issues. She has history of GE reflux, hyperlipidemia, allergic rhinitis, anxiety disorder.  No known drug allergies but Prozac is causing adverse reaction in the past. Vicodin causes nausea.  Colonoscopy in 2006 by Dr. Fuller Plan.  Past medical history: Right ankle surgery to repair a fracture in 1988. Surgery for endometriosis 1998. Left ankle surgery by Dr. Beola Cord November 2005.  Social history: She is married. Has had issues with alcohol consumption in the remote past. This is her second marriage. No children. She smoked previously but has been able to quit. 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  Family history: Father has survived colon cancer but has history of coronary artery disease. Mother with history of diabetes mellitus. One brother with history of drug addiction. One sister and one brother without significant health issues.      Review of Systems  Constitutional: Negative.   All other systems reviewed and are negative.      Objective:   Physical Exam  Constitutional: She is oriented to person, place, and time. She appears well-developed and well-nourished. No distress.  HENT:  Head: Normocephalic and atraumatic.  Right Ear: External ear normal.  Left Ear: External ear normal.  Mouth/Throat: Oropharynx is clear and moist. No oropharyngeal exudate.  Eyes: Conjunctivae and EOM are normal. Pupils are equal, round, and reactive to light. Right eye exhibits no discharge. Left eye exhibits no discharge. No scleral icterus.  Neck: Neck supple. No JVD present. No thyromegaly present.  Cardiovascular: Normal rate, regular rhythm, normal heart sounds and intact distal pulses.   No  murmur heard. Pulmonary/Chest: Effort normal and breath sounds normal. No respiratory distress. She has no wheezes. She has no rales.  Breasts normal female  Abdominal: Soft. Bowel sounds are normal. She exhibits no distension and no mass. There is no tenderness. There is no rebound and no guarding.  Genitourinary:  Pap taken 2014. Bimanual normal.  Musculoskeletal: Normal range of motion. She exhibits no edema.  Lymphadenopathy:    She has no cervical adenopathy.  Neurological: She is alert and oriented to person, place, and time. She has normal reflexes. Coordination normal.  Skin: Skin is warm and dry. No rash noted. She is not diaphoretic.  Psychiatric: She has a normal mood and affect. Her behavior is normal. Judgment and thought content normal.  Vitals reviewed.         Assessment & Plan:  Hyperlipidemia  Allergic rhinitis  Anxiety disorder  GE reflux  Recovering alcoholic  Plan: Had colonoscopy in 2006. Should be due in 2016. She should check with gastroenterologist. Tetanus immunization in 2009. Recommend annual mammogram.

## 2014-05-10 ENCOUNTER — Encounter: Payer: Self-pay | Admitting: Sports Medicine

## 2014-05-10 ENCOUNTER — Ambulatory Visit (INDEPENDENT_AMBULATORY_CARE_PROVIDER_SITE_OTHER): Payer: BC Managed Care – PPO | Admitting: Sports Medicine

## 2014-05-10 VITALS — BP 143/81 | Ht 69.0 in | Wt 153.0 lb

## 2014-05-10 DIAGNOSIS — M6249 Contracture of muscle, multiple sites: Secondary | ICD-10-CM

## 2014-05-10 DIAGNOSIS — M62838 Other muscle spasm: Secondary | ICD-10-CM

## 2014-05-12 DIAGNOSIS — M62838 Other muscle spasm: Secondary | ICD-10-CM | POA: Insufficient documentation

## 2014-05-12 NOTE — Progress Notes (Signed)
  Donna [name] - 64 y.o. female MRN [id number]  Date of birth: 1951-04-06  SUBJECTIVE:  Including CC & ROS.  Donna [name] is 64 yo female who present today with upper back and thoracic muscle pain. She works as a Materials engineer and c/o muscle soreness, stiffness, tightness, and spasm left great then right most days after working. At rest and after stretching the pain is a 4/10 but at the worst its a 10/10. She has no shoulder pain, no decrease ROM in the neck or shoulders. She denies any numbness or tingling into the scapula, shoulder or down the arm. She has tried rest, heat, stretching with some relief. She has not tried any manipulation or aquapuncture but did some dry needling with increase pain.    ROS: Review of systems otherwise negative except for information present in HPI  HISTORY: Past Medical, Surgical, Social, and Family History Reviewed & Updated per EMR. Pertinent Historical Findings include: Non-smoker, lumbar-sacral pain, anxiety   DATA REVIEWED: Personally reviewed patient cervical spine 3 view xray from June 2014 that showed only mild degenerative changes with no significant only mild disc space loss or facet narrow.  PHYSICAL EXAM:  VS: BP:(!) 143/81 mmHg  HR: bpm  TEMP: ( )  RESP:   HT:5\' 9"  (175.3 cm)   WT:153 lb (69.4 kg)  BMI:22.6 UPPER BACK EXAM: General: well nourished Skin of UE: warm; dry, no rashes, lesions, ecchymosis or erythema. Vascular: radial pulses 2+ bilaterally Observation: Normal curvature no excessive lordosis or kyphosis or scoliosis.  Shoulders are aligned, tips of scapula are symmetric Palpation: No step off defects throughout the cervical or thoracic spine.  Moderate significant paraspinal muscle, trapezius, and rhomboid muscle tenderness Range of motion: Normal shoulder range of motion.  Normal range of motion in flexion, extension, rotation of the neck. Special tests: Negative Spurling sign: No radiation down into the hand OMT exam: C5-7 SrRL,  T1-T7 SLRr, T12-L1 SrRL Patient receive HVLA for cervical thoracic and lumbar dysfunction Motor and sensory: Shoulder Abduction (C5) Intact Elbow Flexion (C6) intact Shoulder Extension above head (C7) intact Forearm Pronation - C7/8 intact Wrist Extension (C6) intact Wrist Flexion (C7) intact Fingers Extension/ Flexion (C7, C8) intact Finger Abduction/adduction (T1) intact  ASSESSMENT & PLAN: See problem based charting & AVS for pt instructions.

## 2014-05-12 NOTE — Assessment & Plan Note (Signed)
Patient present today with cervical paraspinal and upper back muscle spasms without any neuropathy associated symptoms or weakness found on exam. Patient was offer conservative treatment with medications such as NSAID'S, muscle relaxer, and prednisone which she refused. We also discuss alternative and physical therapy such as formal PT, acupuncture, and OMT. Patient was interested in OMT treatment today and will work on rehab stretching on her own and consider acupuncture. She will f/u PRN if she desires to repeat OMT.

## 2014-05-24 ENCOUNTER — Encounter: Payer: Self-pay | Admitting: Internal Medicine

## 2014-05-25 ENCOUNTER — Encounter: Payer: Self-pay | Admitting: Internal Medicine

## 2014-06-08 ENCOUNTER — Encounter (HOSPITAL_BASED_OUTPATIENT_CLINIC_OR_DEPARTMENT_OTHER): Payer: Self-pay

## 2014-06-08 ENCOUNTER — Emergency Department (HOSPITAL_BASED_OUTPATIENT_CLINIC_OR_DEPARTMENT_OTHER)
Admission: EM | Admit: 2014-06-08 | Discharge: 2014-06-08 | Disposition: A | Discharge status: Home / Self Care | Payer: BLUE CROSS/BLUE SHIELD | Attending: Emergency Medicine | Admitting: Emergency Medicine

## 2014-06-08 DIAGNOSIS — Z87891 Personal history of nicotine dependence: Secondary | ICD-10-CM | POA: Diagnosis not present

## 2014-06-08 DIAGNOSIS — I10 Essential (primary) hypertension: Secondary | ICD-10-CM | POA: Insufficient documentation

## 2014-06-08 DIAGNOSIS — R21 Rash and other nonspecific skin eruption: Secondary | ICD-10-CM | POA: Diagnosis present

## 2014-06-08 DIAGNOSIS — K219 Gastro-esophageal reflux disease without esophagitis: Secondary | ICD-10-CM | POA: Insufficient documentation

## 2014-06-08 DIAGNOSIS — L03317 Cellulitis of buttock: Secondary | ICD-10-CM | POA: Diagnosis not present

## 2014-06-08 DIAGNOSIS — L0100 Impetigo, unspecified: Secondary | ICD-10-CM

## 2014-06-08 DIAGNOSIS — E785 Hyperlipidemia, unspecified: Secondary | ICD-10-CM | POA: Diagnosis not present

## 2014-06-08 DIAGNOSIS — Z79899 Other long term (current) drug therapy: Secondary | ICD-10-CM | POA: Insufficient documentation

## 2014-06-08 DIAGNOSIS — Z880 Allergy status to penicillin: Secondary | ICD-10-CM | POA: Diagnosis not present

## 2014-06-08 DIAGNOSIS — Z792 Long term (current) use of antibiotics: Secondary | ICD-10-CM | POA: Insufficient documentation

## 2014-06-08 DIAGNOSIS — G8929 Other chronic pain: Secondary | ICD-10-CM | POA: Diagnosis not present

## 2014-06-08 DIAGNOSIS — F419 Anxiety disorder, unspecified: Secondary | ICD-10-CM | POA: Insufficient documentation

## 2014-06-08 MED ORDER — CLINDAMYCIN HCL 300 MG PO CAPS: 300.0000 mg | ORAL_CAPSULE | Freq: Four times a day (QID) | ORAL | Status: DC

## 2014-06-08 NOTE — Discharge Instructions (Signed)

## 2014-06-08 NOTE — ED Provider Notes (Signed)
CSN: [id number]     Arrival date & time 06/08/14  1025 History   First MD Initiated Contact with Patient 06/08/14 1051     Chief Complaint  Patient presents with  . Insect Bite     (Consider location/radiation/quality/duration/timing/severity/associated sxs/prior Treatment) Patient is a 64 y.o. female presenting with rash.  Rash Location: L buttock. Quality: blistering, burning and painful   Severity:  Mild Onset quality:  Gradual Duration:  2 days Timing:  Constant Progression:  Worsening Chronicity:  New Context: not animal contact, not insect bite/sting and not sick contacts   Relieved by:  Nothing Worsened by:  Nothing tried Ineffective treatments:  None tried Associated symptoms: no abdominal pain, no fever, no induration, no joint pain, no nausea and not vomiting     Past Medical History  Diagnosis Date  . Tubular adenoma of colon 02/2010  . Diverticula of colon   . Erosive esophagitis   . GERD (gastroesophageal reflux disease)   . IBS (irritable bowel syndrome)   . Hyperlipidemia   . Hiatal hernia   . Arthritis   . Allergic rhinitis   . Anxiety   . Chronic headaches   . Hypertension    Past Surgical History  Procedure Laterality Date  . Tubal ligation    . Ankle surgery      Bilateral    Family History  Problem Relation Age of Onset  . Colon cancer Father 64    Survived  . Heart disease Father   . Heart attack Father   . Colon cancer Paternal Grandmother   . Colon polyps Paternal Aunt   . Diabetes Mother    History  Substance Use Topics  . Smoking status: Former Smoker -- 30 years    Types: Cigarettes  . Smokeless tobacco: Never Used  . Alcohol Use: Yes     Comment: 1 daily    OB History    No data available     Review of Systems  Constitutional: Negative for fever.  Gastrointestinal: Negative for nausea, vomiting and abdominal pain.  Musculoskeletal: Negative for arthralgias.  Skin: Positive for rash.  All other systems reviewed and  are negative.     Allergies  Penicillins and Quinolones  Home Medications   Prior to Admission medications   Medication Sig Start Date End Date Taking? Authorizing Provider  b complex vitamins tablet Take 1 tablet by mouth daily.      Historical Provider, MD  calcium gluconate 500 MG tablet Take 500 mg by mouth daily.      Historical Provider, MD  clindamycin (CLEOCIN) 300 MG capsule Take 1 capsule (300 mg total) by mouth 4 (four) times daily. X 7 days 06/08/14   Debby Freiberg, MD  estradiol (ESTRACE) 1 MG tablet Take 1 mg by mouth daily.      Historical Provider, MD  Flaxseed, Linseed, (FLAX SEED OIL) 1000 MG CAPS Take 1 capsule by mouth daily.      Historical Provider, MD  Magnesium 500 MG TABS Take 1 tablet by mouth daily.      Historical Provider, MD  Multiple Vitamin (MULTIVITAMIN) capsule Take 1 capsule by mouth daily.      Historical Provider, MD  NONFORMULARY OR COMPOUNDED ITEM Progesterone 50mg  cap 02/15/13   Elby Showers, MD  NONFORMULARY OR COMPOUNDED ITEM Estradiol/testosterone 0.25-0.25mg /ml cream 01/30/14   Elby Showers, MD  Omega-3 Fatty Acids (OMEGA-3 FISH OIL) 1200 MG CAPS Take 1 capsule by mouth daily.      Historical Provider,  MD   BP 124/65 mmHg  Pulse 86  Temp(Src) 98.2 F (36.8 C) (Oral)  Resp 16  Ht 5\' 7"  (1.702 m)  Wt 153 lb (69.4 kg)  BMI 23.96 kg/m2  SpO2 99% Physical Exam  Constitutional: She is oriented to person, place, and time. She appears well-developed and well-nourished.  HENT:  Head: Normocephalic and atraumatic.  Right Ear: External ear normal.  Left Ear: External ear normal.  Eyes: Conjunctivae and EOM are normal. Pupils are equal, round, and reactive to light.  Neck: Normal range of motion. Neck supple.  Cardiovascular: Normal rate, regular rhythm, normal heart sounds and intact distal pulses.   Pulmonary/Chest: Effort normal and breath sounds normal.  Abdominal: Soft. Bowel sounds are normal. There is no tenderness.  Musculoskeletal:  Normal range of motion.  Neurological: She is alert and oriented to person, place, and time.  Skin: Skin is warm and dry.  3 cm erythematous patch to L buttock, some golden crusting, no fluctuance  Vitals reviewed.   ED Course  Procedures (including critical care time) Labs Review Labs Reviewed - No data to display  Imaging Review No results found.   EKG Interpretation None      MDM   Final diagnoses:  Impetigo  Cellulitis of buttock    64 y.o. female without pertinent PMH presents with impetigo of L buttock.  No insect bites.  No systemic symptoms.  Exam characteristic, no palpable abscess.  DC home with clindamycin, standard return precautions for cellulitis.    I have reviewed all laboratory and imaging studies if ordered as above  1. Impetigo   2. Cellulitis of buttock         Debby Freiberg, MD 06/08/14 1133

## 2014-06-08 NOTE — ED Notes (Signed)
MD at bedside. 

## 2014-06-08 NOTE — ED Notes (Signed)
Reports ?spider bite to left buttock. Sts it looks worse today

## 2014-06-15 ENCOUNTER — Other Ambulatory Visit: Payer: Self-pay | Admitting: Internal Medicine

## 2014-06-15 ENCOUNTER — Encounter: Payer: Self-pay | Admitting: Internal Medicine

## 2014-06-15 ENCOUNTER — Ambulatory Visit (INDEPENDENT_AMBULATORY_CARE_PROVIDER_SITE_OTHER): Payer: BLUE CROSS/BLUE SHIELD | Admitting: Internal Medicine

## 2014-06-15 VITALS — BP 116/70 | HR 66 | Temp 97.9°F | Wt 157.0 lb

## 2014-06-15 DIAGNOSIS — IMO0001 Reserved for inherently not codable concepts without codable children: Secondary | ICD-10-CM

## 2014-06-15 DIAGNOSIS — B009 Herpesviral infection, unspecified: Secondary | ICD-10-CM

## 2014-06-15 DIAGNOSIS — T814XXA Infection following a procedure, initial encounter: Secondary | ICD-10-CM

## 2014-06-15 MED ORDER — NONFORMULARY OR COMPOUNDED ITEM: Status: DC

## 2014-06-15 MED ORDER — VALACYCLOVIR HCL 500 MG PO TABS: 500.0000 mg | ORAL_TABLET | Freq: Two times a day (BID) | ORAL | Status: DC

## 2014-06-15 NOTE — Progress Notes (Signed)
   Subjective:    Patient ID: JAMESYN MOOREFIELD, female    DOB: 03-Jan-1951, 64 y.o.   MRN: [id number]  HPI Was seen in emergency department January 29 regarding lesion left buttock that she noted upon arising in the morning. She thought it was a spider bite. Was diagnosed with cellulitis and was placed on clindamycin. It was itching initially and then became more irritated and painful. She does have a history of herpes simplex type I with occasional fever blisters. Also has noted occasionally small blister crease of buttocks. No history of vaginal herpes simplex or genital herpes simplex.    Review of Systems     Objective:   Physical Exam  She has pictures of from various stages of the lesion from onset through several days later showing numerous vesicles. These vesicles are beginning to dry the nail. Culture was taken.      Assessment & Plan:  Herpes simplex left buttock  Plan: Valtrex 500 mg twice daily for 5 days with refills. Culture pending.

## 2014-06-15 NOTE — Addendum Note (Signed)
Addended by: Leota Jacobsen on: 06/15/2014 01:17 PM   Modules accepted: Orders

## 2014-06-15 NOTE — Patient Instructions (Signed)
Take Valtrex 500 mg twice daily for 5 days. If have recurrent outbreaks start Valtrex immediately. Culture pending.

## 2014-06-19 LAB — CYTOMEGALOVIRUS CULTURE

## 2014-06-20 ENCOUNTER — Telehealth: Payer: Self-pay | Admitting: *Deleted

## 2014-06-20 NOTE — Telephone Encounter (Signed)
Called [facility] labs to request Herpes culture be added to patient current culture

## 2014-06-25 LAB — OTHER SOLSTAS TEST

## 2014-08-08 NOTE — Patient Instructions (Signed)
Have mammogram and bone density study. Call about repeat screening colonoscopy. Return in one year or as needed.

## 2014-09-05 ENCOUNTER — Other Ambulatory Visit: Payer: Self-pay | Admitting: Internal Medicine

## 2014-09-05 DIAGNOSIS — M8580 Other specified disorders of bone density and structure, unspecified site: Secondary | ICD-10-CM

## 2014-09-27 ENCOUNTER — Other Ambulatory Visit: Payer: Self-pay | Admitting: Physician Assistant

## 2014-09-27 DIAGNOSIS — C4492 Squamous cell carcinoma of skin, unspecified: Secondary | ICD-10-CM

## 2014-09-27 HISTORY — DX: Squamous cell carcinoma of skin, unspecified: C44.92

## 2014-10-23 ENCOUNTER — Telehealth: Payer: Self-pay | Admitting: Internal Medicine

## 2014-10-23 NOTE — Telephone Encounter (Signed)
Patient has lab 6/23 and 6 month f/u for hyperlipidemia on 7/15.  Wants to know if we will draw for RA on 6/23 too?  Also wanted to know if we would draw for glucose?  I advised twice that we were drawing lipid only and explained that was for her cholesterol and this visit was for 6 month follow up on her cholesterol, not her annual exam.  Do you want to add this or wait and discuss this with her perhaps to be done at her annual exam?    Please advise.

## 2014-10-23 NOTE — Telephone Encounter (Signed)
She had a negative CCP and sed rate for RA in 2013. This can be repeated at time of labs dx joint pain

## 2014-11-01 ENCOUNTER — Other Ambulatory Visit: Payer: Self-pay | Admitting: Internal Medicine

## 2014-11-01 ENCOUNTER — Other Ambulatory Visit: Payer: BLUE CROSS/BLUE SHIELD | Admitting: Internal Medicine

## 2014-11-01 DIAGNOSIS — E7800 Pure hypercholesterolemia, unspecified: Secondary | ICD-10-CM

## 2014-11-01 DIAGNOSIS — M2550 Pain in unspecified joint: Secondary | ICD-10-CM

## 2014-11-02 ENCOUNTER — Ambulatory Visit: Payer: BC Managed Care – PPO | Admitting: Internal Medicine

## 2014-11-02 LAB — SEDIMENTATION RATE: Sed Rate: 4 mm/hr (ref 0–30)

## 2014-11-02 LAB — CYCLIC CITRUL PEPTIDE ANTIBODY, IGG: Cyclic Citrullin Peptide Ab: <2 U/mL (ref 0.0–5.0)

## 2014-11-02 LAB — LIPID PANEL
Cholesterol: 186 mg/dL (ref 0–200)
HDL: 87 mg/dL (ref >=46)
LDL Cholesterol: 90 mg/dL (ref 0–99)
Total CHOL/HDL Ratio: 2.1 Ratio
Triglycerides: 46 mg/dL (ref <150)
VLDL: 9 mg/dL (ref 0–40)

## 2014-11-05 ENCOUNTER — Ambulatory Visit: Payer: BLUE CROSS/BLUE SHIELD | Admitting: Internal Medicine

## 2014-11-23 ENCOUNTER — Encounter: Payer: Self-pay | Admitting: Internal Medicine

## 2014-11-23 ENCOUNTER — Ambulatory Visit (INDEPENDENT_AMBULATORY_CARE_PROVIDER_SITE_OTHER): Payer: BLUE CROSS/BLUE SHIELD | Admitting: Internal Medicine

## 2014-11-23 VITALS — BP 122/72 | HR 68 | Temp 97.7°F | Wt 156.0 lb

## 2014-11-23 DIAGNOSIS — F411 Generalized anxiety disorder: Secondary | ICD-10-CM | POA: Diagnosis not present

## 2014-11-23 DIAGNOSIS — C44729 Squamous cell carcinoma of skin of left lower limb, including hip: Secondary | ICD-10-CM

## 2014-11-23 DIAGNOSIS — E785 Hyperlipidemia, unspecified: Secondary | ICD-10-CM | POA: Diagnosis not present

## 2014-12-09 NOTE — Progress Notes (Signed)
   Subjective:    Patient ID: Donna [name], female    DOB: 1950-09-28, 64 y.o.   MRN: [id number]  HPI For recheck on hyperlipidemia. Last visit total cholesterol was 247 with an LDL cholesterol of 153. She's brought this down with diet and exercise and lipid panel is now within normal limits. She does not want to be on statin medication. Also she requested prior to this office visit being checked for rheumatoid arthritis. Apparently someone in the family has this. Our to coming to office she had sedimentation rate, CCP and rheumatoid factor.   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 See path report  Review of Systems noncontributory     Objective:   Physical Exam  Skin warm and dry. Nodes none. Chest clear. Cardiac exam regular rate and rhythm. Extremity is without edema      Assessment & Plan:  Hyperlipidemia-improved to within normal limits with  diet and exercise  Recently excised squamous cell carcinoma left thigh  Anxiety  Plan: Continue diet and exercise. Return in 6 months for physical exam.

## 2014-12-09 NOTE — Patient Instructions (Signed)
Return in 6 months for physical exam. Keep up good work with diet and exercise. It was a pleasure to see you today

## 2014-12-27 ENCOUNTER — Other Ambulatory Visit: Payer: BLUE CROSS/BLUE SHIELD | Admitting: Internal Medicine

## 2014-12-31 ENCOUNTER — Encounter: Payer: Self-pay | Admitting: Gastroenterology

## 2015-01-04 ENCOUNTER — Other Ambulatory Visit: Payer: Self-pay | Admitting: Physician Assistant

## 2015-01-08 ENCOUNTER — Encounter: Payer: Self-pay | Admitting: Gastroenterology

## 2015-03-11 ENCOUNTER — Ambulatory Visit (AMBULATORY_SURGERY_CENTER): Payer: Self-pay

## 2015-03-11 VITALS — Ht 67.0 in | Wt 160.0 lb

## 2015-03-11 DIAGNOSIS — Z860100 Personal history of colon polyps, unspecified: Secondary | ICD-10-CM

## 2015-03-11 MED ORDER — SUPREP BOWEL PREP KIT 17.5-3.13-1.6 GM/177ML PO SOLN: 1.0000 | Freq: Once | ORAL | Status: DC

## 2015-03-11 NOTE — Progress Notes (Signed)
No allergies to soy BUT BREAKS OUT WITH FLU SHOT IN "EXCEMA" No diet/weight loss meds No home oxygen No past problems with anesthesia  Has email  Refused emmi; has had before

## 2015-03-20 ENCOUNTER — Encounter: Payer: Self-pay | Admitting: Gastroenterology

## 2015-03-25 ENCOUNTER — Ambulatory Visit (AMBULATORY_SURGERY_CENTER): Payer: Managed Care, Other (non HMO) | Admitting: Gastroenterology

## 2015-03-25 ENCOUNTER — Encounter: Payer: Self-pay | Admitting: Gastroenterology

## 2015-03-25 VITALS — BP 105/62 | HR 45 | Temp 97.3°F | Resp 19 | Ht 67.0 in | Wt 160.0 lb

## 2015-03-25 DIAGNOSIS — Z86010 Personal history of colonic polyps: Secondary | ICD-10-CM

## 2015-03-25 DIAGNOSIS — D125 Benign neoplasm of sigmoid colon: Secondary | ICD-10-CM | POA: Diagnosis not present

## 2015-03-25 MED ORDER — SODIUM CHLORIDE 0.9 % IV SOLN: 500.0000 mL | INTRAVENOUS | Status: DC

## 2015-03-25 NOTE — Progress Notes (Signed)
Called to room to assist during endoscopic procedure.  Patient ID and intended procedure confirmed with present staff. Received instructions for my participation in the procedure from the performing physician.  

## 2015-03-25 NOTE — Op Note (Signed)
[place]  [address] [place], [id number]   COLONOSCOPY PROCEDURE REPORT  PATIENT: Donna, [name]  MR#: [id number] BIRTHDATE: 08-09-1950 , 64  yrs. old GENDER: female ENDOSCOPIST: Ladene Artist, MD, [facility] PROCEDURE DATE:  03/25/2015 PROCEDURE:   Colonoscopy, surveillance and Colonoscopy with snare polypectomy First Screening Colonoscopy - Avg.  risk and is 50 yrs.  old or older - No.  Prior Negative Screening - Now for repeat screening. N/A  History of Adenoma - Now for follow-up colonoscopy & has been > or = to 3 yrs.  Yes hx of adenoma.  Has been 3 or more years since last colonoscopy.  Polyps removed today? Yes ASA CLASS:   Class II INDICATIONS:Surveillance due to prior colonic neoplasia and PH Colon Adenoma. MEDICATIONS: Monitored anesthesia care and Propofol 200 mg IV DESCRIPTION OF PROCEDURE:   After the risks benefits and alternatives of the procedure were thoroughly explained, informed consent was obtained.  The digital rectal exam revealed no abnormalities of the rectum.   The LB [id number]  endoscope was introduced through the anus and advanced to the cecum, which was identified by both the appendix and ileocecal valve. No adverse events experienced.   The quality of the prep was excellent. (Suprep was used)  The instrument was then slowly withdrawn as the colon was fully examined. Estimated blood loss is zero unless otherwise noted in this procedure report.    COLON FINDINGS: A sessile polyp measuring 6 mm in size was found in the sigmoid colon.  A polypectomy was performed with a cold snare. The resection was complete, the polyp tissue was completely retrieved and sent to histology.   There was mild diverticulosis noted in the transverse colon.   The examination was otherwise normal.  Retroflexed views revealed no abnormalities. The time to cecum = 2.5 Withdrawal time = 10.4   The scope was withdrawn and the procedure  completed. COMPLICATIONS: There were no immediate complications.  ENDOSCOPIC IMPRESSION: 1.   Sessile polyp in the sigmoid colon; polypectomy performed with a cold snare 2.   Mild diverticulosis in the transverse colon  RECOMMENDATIONS: 1.  Await pathology results 2.  High fiber diet with liberal fluid intake. 3.  Repeat Colonoscopy in 5 years.  eSigned:  Ladene Artist, MD, [facility] 03/25/2015 9:33 AM

## 2015-03-25 NOTE — Patient Instructions (Signed)
YOU HAD AN ENDOSCOPIC PROCEDURE TODAY AT THE [facility]:   Refer to the procedure report that was given to you for any specific questions about what was found during the examination.  If the procedure report does not answer your questions, please call your gastroenterologist to clarify.  If you requested that your care partner not be given the details of your procedure findings, then the procedure report has been included in a sealed envelope for you to review at your convenience later.  YOU SHOULD EXPECT: Some feelings of bloating in the abdomen. Passage of more gas than usual.  Walking can help get rid of the air that was put into your GI tract during the procedure and reduce the bloating. If you had a lower endoscopy (such as a colonoscopy or flexible sigmoidoscopy) you may notice spotting of blood in your stool or on the toilet paper. If you underwent a bowel prep for your procedure, you may not have a normal bowel movement for a few days.  Please Note:  You might notice some irritation and congestion in your nose or some drainage.  This is from the oxygen used during your procedure.  There is no need for concern and it should clear up in a day or so.  SYMPTOMS TO REPORT IMMEDIATELY:   Following lower endoscopy (colonoscopy or flexible sigmoidoscopy):  Excessive amounts of blood in the stool  Significant tenderness or worsening of abdominal pains  Swelling of the abdomen that is new, acute  Fever of 100F or higher   For urgent or emergent issues, a gastroenterologist can be reached at any hour by calling [phone].   DIET: Your first meal following the procedure should be a small meal and then it is ok to progress to your normal diet. Heavy or fried foods are harder to digest and may make you feel nauseous or bloated.  Likewise, meals heavy in dairy and vegetables can increase bloating.  Drink plenty of fluids but you should avoid alcoholic beverages for 24 hours. Try to  increase the fiber in your diet.  ACTIVITY:  You should plan to take it easy for the rest of today and you should NOT DRIVE or use heavy machinery until tomorrow (because of the sedation medicines used during the test).    FOLLOW UP: Our staff will call the number listed on your records the next business day following your procedure to check on you and address any questions or concerns that you may have regarding the information given to you following your procedure. If we do not reach you, we will leave a message.  However, if you are feeling well and you are not experiencing any problems, there is no need to return our call.  We will assume that you have returned to your regular daily activities without incident.  If any biopsies were taken you will be contacted by phone or by letter within the next 1-3 weeks.  Please call us at [phone] if you have not heard about the biopsies in 3 weeks.    SIGNATURES/CONFIDENTIALITY: You and/or your care partner have signed paperwork which will be entered into your electronic medical record.  These signatures attest to the fact that that the information above on your After Visit Summary has been reviewed and is understood.  Full responsibility of the confidentiality of this discharge information lies with you and/or your care-partner.  Read all of the handouts given to you by your recovery room nurse. 

## 2015-03-25 NOTE — Progress Notes (Signed)
Report to PACU, RN, vss, BBS= Clear.  

## 2015-03-26 ENCOUNTER — Telehealth: Payer: Self-pay | Admitting: Emergency Medicine

## 2015-03-26 NOTE — Telephone Encounter (Signed)
  Follow up Call-  Call back number 03/25/2015  Post procedure Call Back phone  # [phone]  Permission to leave phone message Yes     Patient questions:  Do you have a fever, pain , or abdominal swelling? No. Pain Score  0 *  Have you tolerated food without any problems? Yes.    Have you been able to return to your normal activities? Yes.    Do you have any questions about your discharge instructions: Diet   No. Medications  No. Follow up visit  No.  Do you have questions or concerns about your Care? No.  Actions: * If pain score is 4 or above: No action needed, pain <4.

## 2015-04-02 ENCOUNTER — Encounter: Payer: Self-pay | Admitting: Gastroenterology

## 2015-04-15 ENCOUNTER — Encounter: Payer: Self-pay | Admitting: Internal Medicine

## 2015-05-23 ENCOUNTER — Encounter: Payer: Self-pay | Admitting: Sports Medicine

## 2015-05-23 ENCOUNTER — Ambulatory Visit (INDEPENDENT_AMBULATORY_CARE_PROVIDER_SITE_OTHER): Payer: Managed Care, Other (non HMO) | Admitting: Sports Medicine

## 2015-05-23 VITALS — BP 128/70 | Ht 67.0 in | Wt 156.0 lb

## 2015-05-23 DIAGNOSIS — M25562 Pain in left knee: Secondary | ICD-10-CM

## 2015-05-23 DIAGNOSIS — M25561 Pain in right knee: Secondary | ICD-10-CM | POA: Diagnosis not present

## 2015-05-23 NOTE — Progress Notes (Signed)
   Subjective:    Patient ID: Donna [name], female    DOB: [birth year], 65 y.o.   MRN: [id number]  HPI chief complaint bilateral knee pain  Patient comes in today complaining of bilateral knee pain. She complains of pain in the posterior left knee which has been present for the past 2 months without any known injury. Worse with activity. Improves at rest. No swelling. No locking or catching. No significant problems with this knee in the past. No numbness or tingling. No feelings of instability. No treatment to date. She is also complaining of lateral right knee pain which is present only when doing a deep figure four type of stretch. She describes a painful "catch" along the lateral knee. No swelling. It is not present with any other activity. No locking. No instability. No problems with this knee in the past.  Interim medical history reviewed Medications reviewed Allergies reviewed    Review of Systems    as above Objective:   Physical Exam  Well-developed, well-nourished. No acute distress. Awake alert and oriented 3. Vital signs reviewed  Right knee: Full range of motion. No effusion. No soft tissue swelling. There is some tenderness to palpation along the distal IT band over the lateral femoral condyle. Good hip abductor strength. No joint line tenderness to palpation. Negative McMurray's. Negative Thessaly's. Good joint stability. Neurovascularly intact distally.  Left knee: Full range of motion. No effusion. There is some slight fullness to palpation in the popliteal fossa. There is tenderness to palpation along the medial to lateral aspect of the popliteal fossa in the area of the lateral gastrocnemius. No tenderness along the medial or lateral joint lines. Negative McMurray's. Negative Thessaly's. Good joint stability. Neurovascularly intact distally.  MSK ultrasound of the right knee shows no effusion. Ultrasound of the left knee also shows no effusion. Scan of the posterior  aspect of the left knee shows no obvious Baker's cyst.      Assessment & Plan:  Right knee pain secondary to distal IT band irritation Left knee pain likely secondary to lateral head of the gastrocnemius muscle strain  Patient is reassured that I do not think there is any sort of intra-articular pathology. I recommended that she avoid those stretches that she knows will aggravate her right knee. 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 I'm not sure if this is an area that [place] is comfortable dry needling but if so I think that is okay to try. Patient will follow-up with me if symptoms persist or worsen.

## 2015-05-30 ENCOUNTER — Other Ambulatory Visit: Payer: BLUE CROSS/BLUE SHIELD | Admitting: Internal Medicine

## 2015-05-31 ENCOUNTER — Encounter: Payer: BLUE CROSS/BLUE SHIELD | Admitting: Internal Medicine

## 2015-06-13 ENCOUNTER — Other Ambulatory Visit: Payer: Managed Care, Other (non HMO) | Admitting: Internal Medicine

## 2015-06-13 DIAGNOSIS — R7309 Other abnormal glucose: Secondary | ICD-10-CM

## 2015-06-13 DIAGNOSIS — Z79899 Other long term (current) drug therapy: Secondary | ICD-10-CM

## 2015-06-13 DIAGNOSIS — Z1329 Encounter for screening for other suspected endocrine disorder: Secondary | ICD-10-CM

## 2015-06-13 DIAGNOSIS — E785 Hyperlipidemia, unspecified: Secondary | ICD-10-CM

## 2015-06-13 DIAGNOSIS — Z130 Encounter for screening for diseases of the blood and blood-forming organs and certain disorders involving the immune mechanism: Secondary | ICD-10-CM

## 2015-06-13 DIAGNOSIS — Z0000 Encounter for general adult medical examination without abnormal findings: Secondary | ICD-10-CM

## 2015-06-13 DIAGNOSIS — Z1321 Encounter for screening for nutritional disorder: Secondary | ICD-10-CM

## 2015-06-13 LAB — COMPLETE METABOLIC PANEL WITH GFR
ALT: 17 U/L (ref 6–29)
AST: 21 U/L (ref 10–35)
Albumin: 4.2 g/dL (ref 3.6–5.1)
Alkaline Phosphatase: 64 U/L (ref 33–130)
BUN: 9 mg/dL (ref 7–25)
CO2: 25 mmol/L (ref 20–31)
Calcium: 8.8 mg/dL (ref 8.6–10.4)
Chloride: 98 mmol/L (ref 98–110)
Creat: 0.65 mg/dL (ref 0.50–0.99)
GFR, Est African American: >89 mL/min (ref >=60)
GFR, Est Non African American: >89 mL/min (ref >=60)
Glucose, Bld: 104 mg/dL — ABNORMAL HIGH (ref 65–99)
Potassium: 4.4 mmol/L (ref 3.5–5.3)
Sodium: 133 mmol/L — ABNORMAL LOW (ref 135–146)
Total Bilirubin: 0.5 mg/dL (ref 0.2–1.2)
Total Protein: 6.4 g/dL (ref 6.1–8.1)

## 2015-06-13 LAB — CBC WITH DIFFERENTIAL/PLATELET
Basophils Absolute: 0 10*3/uL (ref 0.0–0.1)
Basophils Relative: 0 % (ref 0–1)
Eosinophils Absolute: 0.1 10*3/uL (ref 0.0–0.7)
Eosinophils Relative: 2 % (ref 0–5)
HCT: 38.3 % (ref 36.0–46.0)
Hemoglobin: 12.9 g/dL (ref 12.0–15.0)
Lymphocytes Relative: 29 % (ref 12–46)
Lymphs Abs: 1.3 10*3/uL (ref 0.7–4.0)
MCH: 30.4 pg (ref 26.0–34.0)
MCHC: 33.7 g/dL (ref 30.0–36.0)
MCV: 90.3 fL (ref 78.0–100.0)
MPV: 9.3 fL (ref 8.6–12.4)
Monocytes Absolute: 0.5 10*3/uL (ref 0.1–1.0)
Monocytes Relative: 11 % (ref 3–12)
Neutro Abs: 2.7 10*3/uL (ref 1.7–7.7)
Neutrophils Relative %: 58 % (ref 43–77)
Platelets: 295 10*3/uL (ref 150–400)
RBC: 4.24 MIL/uL (ref 3.87–5.11)
RDW: 13 % (ref 11.5–15.5)
WBC: 4.6 10*3/uL (ref 4.0–10.5)

## 2015-06-13 LAB — HEMOGLOBIN A1C
Hgb A1c MFr Bld: 5.1 % (ref <5.7)
Mean Plasma Glucose: 100 mg/dL (ref <117)

## 2015-06-13 LAB — LIPID PANEL
Cholesterol: 216 mg/dL — ABNORMAL HIGH (ref 125–200)
HDL: 93 mg/dL (ref >=46)
LDL Cholesterol: 115 mg/dL (ref <130)
Total CHOL/HDL Ratio: 2.3 Ratio (ref <=5.0)
Triglycerides: 41 mg/dL (ref <150)
VLDL: 8 mg/dL (ref <30)

## 2015-06-13 LAB — TSH: TSH: 1.578 u[IU]/mL (ref 0.350–4.500)

## 2015-06-14 ENCOUNTER — Ambulatory Visit (INDEPENDENT_AMBULATORY_CARE_PROVIDER_SITE_OTHER): Payer: Managed Care, Other (non HMO) | Admitting: Internal Medicine

## 2015-06-14 ENCOUNTER — Encounter: Payer: Self-pay | Admitting: Internal Medicine

## 2015-06-14 VITALS — BP 130/72 | HR 61 | Temp 98.0°F | Resp 20 | Ht 67.0 in | Wt 160.0 lb

## 2015-06-14 DIAGNOSIS — Z86010 Personal history of colonic polyps: Secondary | ICD-10-CM

## 2015-06-14 DIAGNOSIS — F1021 Alcohol dependence, in remission: Secondary | ICD-10-CM

## 2015-06-14 DIAGNOSIS — Z0000 Encounter for general adult medical examination without abnormal findings: Secondary | ICD-10-CM

## 2015-06-14 DIAGNOSIS — F411 Generalized anxiety disorder: Secondary | ICD-10-CM | POA: Diagnosis not present

## 2015-06-14 DIAGNOSIS — B009 Herpesviral infection, unspecified: Secondary | ICD-10-CM

## 2015-06-14 LAB — VITAMIN D 25 HYDROXY (VIT D DEFICIENCY, FRACTURES): Vit D, 25-Hydroxy: 36 ng/mL (ref 30–100)

## 2015-06-14 MED ORDER — VALACYCLOVIR HCL 500 MG PO TABS: 500.0000 mg | ORAL_TABLET | Freq: Two times a day (BID) | ORAL | Status: DC

## 2015-06-14 NOTE — Progress Notes (Signed)
   Subjective:    Patient ID: Donna [name], female    DOB: 1950/10/06, 65 y.o.   MRN: [id number]  HPI 65 year old Female for health maintenance exam and evaluation of medical problems. She has a history of GE reflux, hyperlipidemia, allergic rhinitis, anxiety disorder.  No known drug allergies but Prozac is caused adverse reaction in the past. Vicodin causes nausea.  Had colonoscopy in 2016 by Dr. Fuller Plan. Had adenomatous polyp. Has had prior adenomatous polyps. Is on 5 year recall.  Past medical history: Right ankle surgery to repair a fracture in 1988. Surgery for endometriosis 1998. Left ankle surgery by Dr. Beola Cord November 2005.  Social history: She is married. Has had alcohol consumption issues in the remote past. This is her second marriage. No children. She smoked previously but has been able to quit. 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  Family history: Father survived colon cancer but had history of coronary artery disease. Mother with history of diabetes mellitus. One brother with history of drug addiction. One sister and one brother without significant health issues.    Review of Systems  Constitutional: Negative.   HENT: Negative.        Objective:   Physical Exam  Constitutional: She is oriented to person, place, and time. She appears well-developed and well-nourished. No distress.  Pulmonary/Chest: Effort normal and breath sounds normal.  Breasts normal female  Genitourinary:  Deferred due to HSV outbreak  Musculoskeletal: She exhibits no edema.  Neurological: She is alert and oriented to person, place, and time. She has normal reflexes.  Skin: She is not diaphoretic.  Vitals reviewed.         Assessment & Plan:  HSV type II-treat with Valtrex  History of allergic rhinitis  Anxiety disorder  GE reflux  History of alcoholism in recovery  Plan:  Valtrex for HSV type II. Continue diet and exercise regimen. Return in one year or as needed. Recommend annual mammogram. Colonoscopy up-to-date. Tetanus immunization up-to-date.

## 2015-06-14 NOTE — Patient Instructions (Addendum)
Take Valtrex 500 mg bid x 5 days prn out break. Return in one year or as needed. Continue diet and exercise.

## 2015-07-01 ENCOUNTER — Encounter: Payer: Self-pay | Admitting: Sports Medicine

## 2015-07-01 ENCOUNTER — Ambulatory Visit (INDEPENDENT_AMBULATORY_CARE_PROVIDER_SITE_OTHER): Payer: Managed Care, Other (non HMO) | Admitting: Sports Medicine

## 2015-07-01 ENCOUNTER — Encounter: Payer: Self-pay | Admitting: Family Medicine

## 2015-07-01 VITALS — BP 158/84 | Ht 67.0 in | Wt 158.0 lb

## 2015-07-01 DIAGNOSIS — M76821 Posterior tibial tendinitis, right leg: Secondary | ICD-10-CM | POA: Diagnosis not present

## 2015-07-01 NOTE — Progress Notes (Signed)
  Donna [name] - 65 y.o. female MRN [id number]  Date of birth: Aug 22, 1950  SUBJECTIVE:  Including CC & ROS.  Pt presents for several months of burning plantar foot and ankle pain. The pain starts in her arch and radiates up her medial/posterior ankle. The pain is not constant but tends to bother her during yoga when doing triangle pose or sitting on her feet. She is nervous because she has ruptured her peroneus tendon on her left foot in the past and this feels similar. No treatments tried other than massage. Leaves on a hiking trip 3/22 and wants to be ready for that.   HISTORY: Past Medical, Surgical, Social, and Family History Reviewed & Updated per EMR.    PHYSICAL EXAM:  VS: BP:(!) 158/84 mmHg  HR: bpm  TEMP: ( )  RESP:   HT:5\' 7"  (170.2 cm)   WT:158 lb (71.668 kg)  BMI:24.8 PHYSICAL EXAM: Ankle/Foot: No visible erythema or swelling. Range of motion is full in all directions. Strength is 5/5 in all directions. High longitudinal arch Stable lateral and medial ligaments; squeeze test and kleiger test unremarkable; Talar dome nontender; No pain at base of 5th MT; No tenderness over cuboid; Mild tenderness on posterior aspects of medial malleolus Able to walk 4 steps.   ASSESSMENT & PLAN: See problem based charting & AVS for pt instructions. Posterior tibial tendonitis of right leg Pain radiating from plantar arch behind medial maleolus and up posterior calf, worse with triangle pose and sitting on plantarflexed foot - gave pigeon-toed heel drop eccentric exercises - rec arch strap - gave sports insoles with scaphoid pads - f/u in 4 weeks, prior to leaving on hiking trip 3/22

## 2015-07-01 NOTE — Assessment & Plan Note (Signed)
Pain radiating from plantar arch behind medial maleolus and up posterior calf, worse with triangle pose and sitting on plantarflexed foot - gave pigeon-toed heel drop eccentric exercises - rec arch strap - gave sports insoles with scaphoid pads - f/u in 4 weeks, prior to leaving on hiking trip 3/22

## 2015-07-11 ENCOUNTER — Encounter: Payer: Self-pay | Admitting: Internal Medicine

## 2015-07-11 ENCOUNTER — Ambulatory Visit
Admission: RE | Admit: 2015-07-11 | Discharge: 2015-07-11 | Disposition: A | Discharge status: Home / Self Care | Payer: Managed Care, Other (non HMO) | Source: Ambulatory Visit | Attending: Internal Medicine | Admitting: Internal Medicine

## 2015-07-11 ENCOUNTER — Ambulatory Visit (INDEPENDENT_AMBULATORY_CARE_PROVIDER_SITE_OTHER): Payer: Managed Care, Other (non HMO) | Admitting: Internal Medicine

## 2015-07-11 VITALS — BP 156/98 | HR 60 | Temp 98.0°F | Resp 18 | Wt 168.0 lb

## 2015-07-11 DIAGNOSIS — T148XXA Other injury of unspecified body region, initial encounter: Secondary | ICD-10-CM

## 2015-07-11 DIAGNOSIS — S2231XA Fracture of one rib, right side, initial encounter for closed fracture: Secondary | ICD-10-CM | POA: Diagnosis not present

## 2015-07-11 DIAGNOSIS — R0789 Other chest pain: Secondary | ICD-10-CM

## 2015-07-11 DIAGNOSIS — T148 Other injury of unspecified body region: Secondary | ICD-10-CM

## 2015-07-11 DIAGNOSIS — R079 Chest pain, unspecified: Secondary | ICD-10-CM | POA: Diagnosis not present

## 2015-07-11 DIAGNOSIS — T07XXXA Unspecified multiple injuries, initial encounter: Secondary | ICD-10-CM

## 2015-07-11 LAB — CBC WITH DIFFERENTIAL/PLATELET
Basophils Absolute: 0 10*3/uL (ref 0.0–0.1)
Basophils Relative: 0 % (ref 0–1)
Eosinophils Absolute: 0.1 10*3/uL (ref 0.0–0.7)
Eosinophils Relative: 2 % (ref 0–5)
HCT: 38.5 % (ref 36.0–46.0)
Hemoglobin: 13.1 g/dL (ref 12.0–15.0)
Lymphocytes Relative: 28 % (ref 12–46)
Lymphs Abs: 1.3 10*3/uL (ref 0.7–4.0)
MCH: 31 pg (ref 26.0–34.0)
MCHC: 34 g/dL (ref 30.0–36.0)
MCV: 91 fL (ref 78.0–100.0)
MPV: 9.4 fL (ref 8.6–12.4)
Monocytes Absolute: 0.5 10*3/uL (ref 0.1–1.0)
Monocytes Relative: 12 % (ref 3–12)
Neutro Abs: 2.6 10*3/uL (ref 1.7–7.7)
Neutrophils Relative %: 58 % (ref 43–77)
Platelets: 233 10*3/uL (ref 150–400)
RBC: 4.23 MIL/uL (ref 3.87–5.11)
RDW: 12.5 % (ref 11.5–15.5)
WBC: 4.5 10*3/uL (ref 4.0–10.5)

## 2015-07-11 MED ORDER — HYDROCODONE-ACETAMINOPHEN 10-325 MG PO TABS: 1.0000 | ORAL_TABLET | Freq: Three times a day (TID) | ORAL | Status: DC | PRN

## 2015-07-11 MED ORDER — TRAMADOL HCL 50 MG PO TABS: 50.0000 mg | ORAL_TABLET | Freq: Three times a day (TID) | ORAL | Status: DC | PRN

## 2015-07-11 MED ORDER — IOPAMIDOL (ISOVUE-300) INJECTION 61%
100.0000 mL | Freq: Once | INTRAVENOUS | Status: AC | PRN
  Administered 2015-07-11: 100 mL via INTRAVENOUS

## 2015-07-11 NOTE — Addendum Note (Signed)
Addended by: Beryle Quant on: 07/11/2015 02:35 PM   Modules accepted: Orders, Medications
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
## 2015-07-11 NOTE — Patient Instructions (Signed)
Have CT of chest and abdomen today.

## 2015-07-17 ENCOUNTER — Ambulatory Visit: Payer: Managed Care, Other (non HMO) | Admitting: Sports Medicine

## 2015-07-30 ENCOUNTER — Encounter: Payer: Self-pay | Admitting: Sports Medicine

## 2015-07-30 ENCOUNTER — Ambulatory Visit (INDEPENDENT_AMBULATORY_CARE_PROVIDER_SITE_OTHER): Payer: Managed Care, Other (non HMO) | Admitting: Sports Medicine

## 2015-07-30 VITALS — BP 129/85 | HR 66 | Ht 67.0 in | Wt 159.0 lb

## 2015-07-30 DIAGNOSIS — M76821 Posterior tibial tendinitis, right leg: Secondary | ICD-10-CM
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
## 2015-08-01 ENCOUNTER — Telehealth: Payer: Self-pay | Admitting: Internal Medicine

## 2015-08-01 DIAGNOSIS — I10 Essential (primary) hypertension: Secondary | ICD-10-CM

## 2015-08-01 NOTE — Telephone Encounter (Signed)
Spoke with patient @ [phone]; advised that we have called in HCTZ 25mg  [facility] [place] @ [phone].  Patient instructed to take 1 tablet daily.  No refills given.  Asked patient if she had eaten something out of the norm.  Patient advised that MSG does sometimes cause her pressure to rise.  That may have been the cause.  And, they had to take 3 different flights to get to [place] and the elevation is much higher there; she doesn't know if that has anything to do with it either.  So, patient will have enough of the medication to do her until she gets back home.    Patient understands and is appreciative.

## 2015-08-01 NOTE — Telephone Encounter (Signed)
Nothing in Epic regarding antihypertensive medication. We'll check old chart.

## 2015-08-01 NOTE — Telephone Encounter (Signed)
She's in [place] at the present time for 2 days.  She'll be there for the next 8 days (she's in training).  Her BP today is 174/95.  She didn't take the BP med with her (that she keeps in her refrigerator at home for when her BP goes up).  She only takes a BP med prn.  Wants to know if you will call her in something [facility] the [facility].  [phone].    Please advise.

## 2015-08-01 NOTE — Telephone Encounter (Signed)
HCTZ 25mg  #15 0 refills

## 2015-08-01 NOTE — Telephone Encounter (Signed)
Patient notified per Sharyn Lull; I verbally contacted pharmacy and left prescription information

## 2015-08-03 ENCOUNTER — Encounter: Payer: Self-pay | Admitting: Internal Medicine

## 2015-08-03 DIAGNOSIS — Z86010 Personal history of colonic polyps: Secondary | ICD-10-CM | POA: Insufficient documentation

## 2015-08-05 ENCOUNTER — Encounter: Payer: Self-pay | Admitting: Gastroenterology

## 2015-09-30 ENCOUNTER — Ambulatory Visit (INDEPENDENT_AMBULATORY_CARE_PROVIDER_SITE_OTHER): Payer: Managed Care, Other (non HMO) | Admitting: Sports Medicine

## 2015-09-30 ENCOUNTER — Encounter: Payer: Self-pay | Admitting: Sports Medicine

## 2015-09-30 VITALS — BP 119/70 | HR 60 | Ht 67.0 in | Wt 157.0 lb

## 2015-09-30 DIAGNOSIS — M25562 Pain in left knee: Secondary | ICD-10-CM
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
## 2015-10-14 ENCOUNTER — Ambulatory Visit
Admission: RE | Admit: 2015-10-14 | Discharge: 2015-10-14 | Disposition: A | Discharge status: Home / Self Care | Payer: Managed Care, Other (non HMO) | Source: Ambulatory Visit | Attending: Sports Medicine | Admitting: Sports Medicine

## 2015-10-14 DIAGNOSIS — M25562 Pain in left knee: Secondary | ICD-10-CM

## 2015-11-14 ENCOUNTER — Ambulatory Visit (INDEPENDENT_AMBULATORY_CARE_PROVIDER_SITE_OTHER): Payer: Managed Care, Other (non HMO) | Admitting: Sports Medicine

## 2015-11-14 ENCOUNTER — Encounter: Payer: Self-pay | Admitting: Sports Medicine

## 2015-11-14 VITALS — BP 127/77 | HR 70 | Ht 67.0 in | Wt 157.0 lb

## 2015-11-14 DIAGNOSIS — M25552 Pain in left hip: Secondary | ICD-10-CM | POA: Diagnosis not present

## 2015-11-15 NOTE — Progress Notes (Signed)
   Subjective:    Patient ID: Donna [name], female    DOB: 1951/01/06, 65 y.o.   MRN: [id number]  HPI   Patient comes in today for follow-up on left knee and left hip pain. Knee pain has resolved. Left hip pain persists. It is primarily in the anterior hip. Worse with activity. She is also complaining of some low back pain but has a history of some mild lumbar degenerative disc disease. She denies any numbness or tingling down either leg. No weakness.    Review of Systems     Objective:   Physical Exam  Well-developed, well-nourished. No acute distress  Left hip: Full painless hip range of motion with a negative logroll. She is tender to palpation along the rectus femoris tendon just distal to the AIIS. Reproducible pain with resisted hip flexion. No tenderness over the greater trochanter. Left knee: Full painless range of motion. No effusion. No tenderness to palpation. Neurovascularly intact distally. Patient walks without a limp.      Assessment & Plan:   Left hip pain likely secondary to hip flexor strain Resolved left knee pain  Patient was satisfied with her diagnosis. She will start some iontophoresis, ultrasound, and hip flexor stretches. If symptoms persist she will notify me and we will consider x-rays of her left hip. Follow-up in 3 weeks if symptoms persist.

## 2015-11-18 ENCOUNTER — Ambulatory Visit: Payer: Managed Care, Other (non HMO) | Admitting: Sports Medicine

## 2015-12-16 ENCOUNTER — Encounter: Payer: Self-pay | Admitting: Internal Medicine

## 2015-12-16 ENCOUNTER — Ambulatory Visit (INDEPENDENT_AMBULATORY_CARE_PROVIDER_SITE_OTHER): Payer: Managed Care, Other (non HMO) | Admitting: Internal Medicine

## 2015-12-16 ENCOUNTER — Telehealth: Payer: Self-pay | Admitting: Internal Medicine

## 2015-12-16 VITALS — BP 142/86 | HR 55 | Temp 97.8°F | Ht 67.0 in | Wt 166.0 lb

## 2015-12-16 DIAGNOSIS — Z09 Encounter for follow-up examination after completed treatment for conditions other than malignant neoplasm: Secondary | ICD-10-CM

## 2015-12-16 DIAGNOSIS — F1020 Alcohol dependence, uncomplicated: Secondary | ICD-10-CM

## 2015-12-16 DIAGNOSIS — Z658 Other specified problems related to psychosocial circumstances: Secondary | ICD-10-CM

## 2015-12-16 DIAGNOSIS — F1021 Alcohol dependence, in remission: Secondary | ICD-10-CM

## 2015-12-16 DIAGNOSIS — F411 Generalized anxiety disorder: Secondary | ICD-10-CM

## 2015-12-16 DIAGNOSIS — Z9289 Personal history of other medical treatment: Secondary | ICD-10-CM

## 2015-12-16 DIAGNOSIS — F439 Reaction to severe stress, unspecified: Secondary | ICD-10-CM

## 2015-12-16 MED ORDER — SERTRALINE HCL 50 MG PO TABS: 50.0000 mg | ORAL_TABLET | Freq: Every day | ORAL | 0 refills | Status: DC
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
## 2015-12-16 NOTE — Telephone Encounter (Signed)
Please call pharmacy and change Zoloft to Lexapro 10 mg daily #30 with no refill- pt changed her mind

## 2015-12-16 NOTE — Patient Instructions (Addendum)
Congratulations on getting help for alcoholism. Start Lexapro 10 mg daily follow-up in 4 weeks.

## 2015-12-16 NOTE — Telephone Encounter (Signed)
Has researched and talked with a couple of people now; and pharmacist friend.  She's now ready to do the Lexapro.    She would like to do the Lexapro.  She didn't pick up the Zoloft.    Pharmacy:  [facility] [address] in [place].  (Not [place]) [phone]

## 2015-12-17 MED ORDER — ESCITALOPRAM OXALATE 10 MG PO TABS: 10.0000 mg | ORAL_TABLET | Freq: Every day | ORAL | 0 refills | Status: DC

## 2016-01-11 ENCOUNTER — Other Ambulatory Visit: Payer: Self-pay | Admitting: Internal Medicine

## 2016-01-17 ENCOUNTER — Encounter: Payer: Self-pay | Admitting: Internal Medicine

## 2016-01-17 ENCOUNTER — Ambulatory Visit (INDEPENDENT_AMBULATORY_CARE_PROVIDER_SITE_OTHER): Payer: Managed Care, Other (non HMO) | Admitting: Internal Medicine

## 2016-01-17 VITALS — BP 114/80 | HR 80 | Temp 97.7°F | Ht 67.0 in | Wt 156.5 lb

## 2016-01-17 DIAGNOSIS — F1021 Alcohol dependence, in remission: Secondary | ICD-10-CM

## 2016-01-17 DIAGNOSIS — F411 Generalized anxiety disorder: Secondary | ICD-10-CM | POA: Diagnosis not present

## 2016-01-17 MED ORDER — CLORAZEPATE DIPOTASSIUM 3.75 MG PO TABS: 3.7500 mg | ORAL_TABLET | Freq: Two times a day (BID) | ORAL | 3 refills | Status: DC | PRN

## 2016-01-17 NOTE — Patient Instructions (Addendum)
Start Tranxene 3.75 mg twice a day prn anxiety. Wean off of Lexapro. Call with progress report in 2 weeks.

## 2016-01-17 NOTE — Progress Notes (Signed)
   Subjective:    Patient ID: Donna [name], female    DOB: 1950/06/09, 65 y.o.   MRN: [id number]  HPI Patient is here today to follow-up on anxiety. She was [facility] for detox from alcohol July 27. I saw her August 8. I started her on Lexapro 10 mg daily. She thinks it's causing some memory loss. Says she cannot tolerate Zoloft it made her feel weird and Prozac also did not agree with her.  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 She continues to teach yoga.    Review of Systems says she could not remember where she was going the other day when she was making a trip to the grocery store. Says this is unusual for her and she attributes it to Lexapro possibly.     Objective:   Physical Exam  Not examined. She is a bit tremulous in the office today. Denies drinking alcohol.      Assessment & Plan:  Recovering alcoholic  Anxiety  Plan: Try Tranxene 3.75 mg up to twice daily as needed for anxiety. She doesn't seem that she tolerates SSRI medication although she used to take Zoloft for a number of years but says in 2012 it made her feel weird. Therefore it seems she has intolerances to Lexapro, Prozac and Zoloft. In all think Wellbutrin would help with anxiety.

## 2016-02-18 ENCOUNTER — Telehealth: Payer: Self-pay | Admitting: Internal Medicine

## 2016-02-18 NOTE — Telephone Encounter (Signed)
Order faxed.

## 2016-02-18 NOTE — Telephone Encounter (Signed)
Order will be faxed today.

## 2016-02-18 NOTE — Telephone Encounter (Signed)
Patient just scheduled a bone density at [facility] for March 10, 2016 and they need a referral put in for her.

## 2016-03-10 LAB — HM DEXA SCAN

## 2016-03-16 ENCOUNTER — Encounter: Payer: Self-pay | Admitting: Internal Medicine

## 2016-04-19 DIAGNOSIS — R6889 Other general symptoms and signs: Secondary | ICD-10-CM | POA: Insufficient documentation

## 2016-04-19 DIAGNOSIS — R945 Abnormal results of liver function studies: Secondary | ICD-10-CM | POA: Insufficient documentation

## 2016-04-19 DIAGNOSIS — F329 Major depressive disorder, single episode, unspecified: Secondary | ICD-10-CM | POA: Insufficient documentation

## 2016-04-19 DIAGNOSIS — F32A Depression, unspecified: Secondary | ICD-10-CM | POA: Insufficient documentation

## 2016-04-19 DIAGNOSIS — D696 Thrombocytopenia, unspecified: Secondary | ICD-10-CM | POA: Insufficient documentation

## 2016-04-22 DIAGNOSIS — K700 Alcoholic fatty liver: Secondary | ICD-10-CM | POA: Insufficient documentation

## 2016-05-29 ENCOUNTER — Other Ambulatory Visit: Payer: Self-pay | Admitting: Obstetrics and Gynecology

## 2016-06-01 ENCOUNTER — Ambulatory Visit: Payer: Managed Care, Other (non HMO) | Admitting: Internal Medicine

## 2016-06-01 LAB — CYTOLOGY - PAP

## 2016-06-11 ENCOUNTER — Ambulatory Visit: Payer: Managed Care, Other (non HMO) | Admitting: Internal Medicine

## 2016-06-18 ENCOUNTER — Other Ambulatory Visit: Payer: Managed Care, Other (non HMO) | Admitting: Internal Medicine

## 2016-06-18 DIAGNOSIS — Z13220 Encounter for screening for lipoid disorders: Secondary | ICD-10-CM

## 2016-06-18 DIAGNOSIS — Z0000 Encounter for general adult medical examination without abnormal findings: Secondary | ICD-10-CM

## 2016-06-18 DIAGNOSIS — Z1329 Encounter for screening for other suspected endocrine disorder: Secondary | ICD-10-CM

## 2016-06-18 DIAGNOSIS — Z1321 Encounter for screening for nutritional disorder: Secondary | ICD-10-CM

## 2016-06-18 LAB — CBC WITH DIFFERENTIAL/PLATELET
Basophils Absolute: 0 cells/uL (ref 0–200)
Basophils Relative: 0 %
Eosinophils Absolute: 48 cells/uL (ref 15–500)
Eosinophils Relative: 1 %
HCT: 38.8 % (ref 35.0–45.0)
Hemoglobin: 13 g/dL (ref 11.7–15.5)
Lymphocytes Relative: 28 %
Lymphs Abs: 1344 cells/uL (ref 850–3900)
MCH: 31.4 pg (ref 27.0–33.0)
MCHC: 33.5 g/dL (ref 32.0–36.0)
MCV: 93.7 fL (ref 80.0–100.0)
MPV: 9.3 fL (ref 7.5–12.5)
Monocytes Absolute: 624 cells/uL (ref 200–950)
Monocytes Relative: 13 %
Neutro Abs: 2784 cells/uL (ref 1500–7800)
Neutrophils Relative %: 58 %
Platelets: 274 10*3/uL (ref 140–400)
RBC: 4.14 MIL/uL (ref 3.80–5.10)
RDW: 13.5 % (ref 11.0–15.0)
WBC: 4.8 10*3/uL (ref 3.8–10.8)

## 2016-06-18 LAB — COMPREHENSIVE METABOLIC PANEL
ALT: 15 U/L (ref 6–29)
AST: 20 U/L (ref 10–35)
Albumin: 4.1 g/dL (ref 3.6–5.1)
Alkaline Phosphatase: 57 U/L (ref 33–130)
BUN: 10 mg/dL (ref 7–25)
CO2: 26 mmol/L (ref 20–31)
Calcium: 9.1 mg/dL (ref 8.6–10.4)
Chloride: 99 mmol/L (ref 98–110)
Creat: 0.78 mg/dL (ref 0.50–0.99)
Glucose, Bld: 97 mg/dL (ref 65–99)
Potassium: 4.1 mmol/L (ref 3.5–5.3)
Sodium: 135 mmol/L (ref 135–146)
Total Bilirubin: 0.7 mg/dL (ref 0.2–1.2)
Total Protein: 6.7 g/dL (ref 6.1–8.1)

## 2016-06-18 LAB — TSH: TSH: 1.76 mIU/L

## 2016-06-18 LAB — LIPID PANEL
Cholesterol: 233 mg/dL — ABNORMAL HIGH (ref <200)
HDL: 90 mg/dL (ref >50)
LDL Cholesterol: 129 mg/dL — ABNORMAL HIGH (ref <100)
Total CHOL/HDL Ratio: 2.6 Ratio (ref <5.0)
Triglycerides: 68 mg/dL (ref <150)
VLDL: 14 mg/dL (ref <30)

## 2016-06-19 ENCOUNTER — Encounter: Payer: Self-pay | Admitting: Internal Medicine

## 2016-06-19 ENCOUNTER — Ambulatory Visit (INDEPENDENT_AMBULATORY_CARE_PROVIDER_SITE_OTHER): Payer: Managed Care, Other (non HMO) | Admitting: Internal Medicine

## 2016-06-19 VITALS — BP 98/68 | HR 68 | Temp 97.8°F | Ht 66.75 in | Wt 161.0 lb

## 2016-06-19 DIAGNOSIS — Z0000 Encounter for general adult medical examination without abnormal findings: Secondary | ICD-10-CM

## 2016-06-19 DIAGNOSIS — Z79899 Other long term (current) drug therapy: Secondary | ICD-10-CM

## 2016-06-19 DIAGNOSIS — B009 Herpesviral infection, unspecified: Secondary | ICD-10-CM

## 2016-06-19 DIAGNOSIS — F1021 Alcohol dependence, in remission: Secondary | ICD-10-CM | POA: Diagnosis not present

## 2016-06-19 DIAGNOSIS — Z5181 Encounter for therapeutic drug level monitoring: Secondary | ICD-10-CM

## 2016-06-19 LAB — POCT URINALYSIS DIPSTICK
Bilirubin, UA: NEGATIVE
Blood, UA: NEGATIVE
Ketones, UA: NEGATIVE
Leukocytes, UA: NEGATIVE
Nitrite, UA: NEGATIVE
Protein, UA: NEGATIVE
Spec Grav, UA: 1.015
Urobilinogen, UA: NEGATIVE
pH, UA: 6.5

## 2016-06-19 LAB — VITAMIN D 25 HYDROXY (VIT D DEFICIENCY, FRACTURES): Vit D, 25-Hydroxy: 38 ng/mL (ref 30–100)

## 2016-06-19 MED ORDER — VALACYCLOVIR HCL 500 MG PO TABS: 500.0000 mg | ORAL_TABLET | Freq: Two times a day (BID) | ORAL | 5 refills | Status: DC

## 2016-06-19 MED ORDER — PROPRANOLOL HCL 10 MG PO TABS: 10.0000 mg | ORAL_TABLET | Freq: Three times a day (TID) | ORAL | 0 refills | Status: DC

## 2016-06-19 NOTE — Patient Instructions (Signed)
Take Valtrex 500 mg twice daily for 5 days for herpes simplex outbreak. Continue propranolol sparingly and naltrexone. Return in 90 days.
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
## 2016-07-14 ENCOUNTER — Other Ambulatory Visit: Payer: Self-pay | Admitting: Internal Medicine

## 2016-07-29 ENCOUNTER — Encounter: Payer: Self-pay | Admitting: *Deleted

## 2016-08-04 ENCOUNTER — Encounter: Payer: Self-pay | Admitting: Internal Medicine

## 2016-08-13 ENCOUNTER — Other Ambulatory Visit: Payer: Self-pay | Admitting: Internal Medicine

## 2016-09-03 ENCOUNTER — Other Ambulatory Visit: Payer: Self-pay

## 2016-09-03 MED ORDER — ESTRIOL 10 % CREA: 0.2500 mg | TOPICAL_CREAM | 99 refills | Status: DC

## 2016-09-07 ENCOUNTER — Other Ambulatory Visit: Payer: Self-pay | Admitting: Internal Medicine

## 2016-09-15 ENCOUNTER — Ambulatory Visit: Payer: Managed Care, Other (non HMO) | Admitting: Internal Medicine

## 2016-10-20 ENCOUNTER — Ambulatory Visit (INDEPENDENT_AMBULATORY_CARE_PROVIDER_SITE_OTHER): Payer: Managed Care, Other (non HMO) | Admitting: Internal Medicine

## 2016-10-20 ENCOUNTER — Encounter: Payer: Self-pay | Admitting: Internal Medicine

## 2016-10-20 VITALS — BP 148/70 | HR 60 | Temp 98.2°F | Wt 164.0 lb

## 2016-10-20 DIAGNOSIS — F1021 Alcohol dependence, in remission: Secondary | ICD-10-CM | POA: Diagnosis not present

## 2016-10-20 DIAGNOSIS — F411 Generalized anxiety disorder: Secondary | ICD-10-CM

## 2016-10-20 DIAGNOSIS — F439 Reaction to severe stress, unspecified: Secondary | ICD-10-CM

## 2016-10-20 NOTE — Progress Notes (Signed)
   Subjective:    Patient ID: Donna [name], female    DOB: Mar 18, 1951, 66 y.o.   MRN: [id number]  HPI  Pt says she saw Dr. Vertell Limber recently and had MRI reviewed and had annular ligament tear L3.  He wants her to do yoga and back strengthening exercises. She says surgery is not needed.   Back pain started up when working in garden last year.  With regard to alcoholism, she's had one slip since I last saw her. Says her husband has some anger issues. He has not been to counseling but says Lexapro is helping him. She is going to counseling.  She is no longer taking naltrexone. She weaned herself off of that a few weeks ago. She is only taking propranolol twice a day although it is ordered 3 times a day. She feels it's helping her stay calm. I am happy to continue to refill it. It's a small dose and also helps her blood pressure.  She is here today for follow-up because I was under the impression she was going to stay on naltrexone for a while. I did not know she had tapered and subsequently discontinued it.   Review of Systems see above     Objective:   Physical Exam BP 138/80 Chest clear. Cardiac exam regular rate and rhythm. Extremities without edema      Assessment & Plan:  Alcoholic in recovery  Plan: No longer on naltrexone. Continue propranolol. Physical exam due February 2019.

## 2016-10-21 NOTE — Patient Instructions (Signed)
Continue propranolol. Physical exam due February 2019.

## 2016-10-30 ENCOUNTER — Other Ambulatory Visit: Payer: Self-pay | Admitting: Internal Medicine

## 2016-11-03 ENCOUNTER — Telehealth: Payer: Self-pay

## 2016-11-03 NOTE — Telephone Encounter (Signed)
Pt called requesting labs for abd pain "on the right side" "it's been going for a week and a half ago" she denied any vomiting, diarrhea, constipation, fever chills. Per Dr. Renold Genta pt needs OV or go to urgent care. Pt refused urgent care she said it wasn't that bad and she would like to come in on Friday on her day off. She was advised if at any time she developed any of these symptoms she needs to go to urgent care, pt verbalized understanding.

## 2016-11-10 ENCOUNTER — Ambulatory Visit: Payer: Managed Care, Other (non HMO) | Admitting: Internal Medicine

## 2016-11-14 ENCOUNTER — Emergency Department (HOSPITAL_BASED_OUTPATIENT_CLINIC_OR_DEPARTMENT_OTHER)
Admission: EM | Admit: 2016-11-14 | Discharge: 2016-11-14 | Disposition: A | Discharge status: Home / Self Care | Payer: Managed Care, Other (non HMO) | Attending: Emergency Medicine | Admitting: Emergency Medicine

## 2016-11-14 ENCOUNTER — Emergency Department (HOSPITAL_BASED_OUTPATIENT_CLINIC_OR_DEPARTMENT_OTHER): Payer: Managed Care, Other (non HMO)

## 2016-11-14 ENCOUNTER — Encounter (HOSPITAL_BASED_OUTPATIENT_CLINIC_OR_DEPARTMENT_OTHER): Payer: Self-pay | Admitting: Emergency Medicine

## 2016-11-14 DIAGNOSIS — R1011 Right upper quadrant pain: Secondary | ICD-10-CM | POA: Insufficient documentation

## 2016-11-14 DIAGNOSIS — Z79899 Other long term (current) drug therapy: Secondary | ICD-10-CM | POA: Diagnosis not present

## 2016-11-14 DIAGNOSIS — I10 Essential (primary) hypertension: Secondary | ICD-10-CM | POA: Insufficient documentation

## 2016-11-14 DIAGNOSIS — E871 Hypo-osmolality and hyponatremia: Secondary | ICD-10-CM | POA: Diagnosis not present

## 2016-11-14 DIAGNOSIS — R109 Unspecified abdominal pain: Secondary | ICD-10-CM | POA: Diagnosis present

## 2016-11-14 DIAGNOSIS — Z87891 Personal history of nicotine dependence: Secondary | ICD-10-CM | POA: Diagnosis not present

## 2016-11-14 LAB — COMPREHENSIVE METABOLIC PANEL
ALT: 51 U/L (ref 14–54)
AST: 57 U/L — ABNORMAL HIGH (ref 15–41)
Albumin: 4.2 g/dL (ref 3.5–5.0)
Alkaline Phosphatase: 62 U/L (ref 38–126)
Anion gap: 14 (ref 5–15)
BUN: 9 mg/dL (ref 6–20)
CO2: 25 mmol/L (ref 22–32)
Calcium: 8.6 mg/dL — ABNORMAL LOW (ref 8.9–10.3)
Chloride: 87 mmol/L — ABNORMAL LOW (ref 101–111)
Creatinine, Ser: 0.68 mg/dL (ref 0.44–1.00)
GFR calc Af Amer: >60 mL/min (ref >60)
GFR calc non Af Amer: >60 mL/min (ref >60)
Glucose, Bld: 128 mg/dL — ABNORMAL HIGH (ref 65–99)
Potassium: 3.8 mmol/L (ref 3.5–5.1)
Sodium: 126 mmol/L — ABNORMAL LOW (ref 135–145)
Total Bilirubin: 0.5 mg/dL (ref 0.3–1.2)
Total Protein: 7.2 g/dL (ref 6.5–8.1)

## 2016-11-14 LAB — URINALYSIS, MICROSCOPIC (REFLEX): WBC, UA: NONE SEEN WBC/hpf (ref 0–5)

## 2016-11-14 LAB — URINALYSIS, ROUTINE W REFLEX MICROSCOPIC
Bilirubin Urine: NEGATIVE
Glucose, UA: NEGATIVE mg/dL
Ketones, ur: 15 mg/dL — AB
Leukocytes, UA: NEGATIVE
Nitrite: NEGATIVE
Protein, ur: NEGATIVE mg/dL
Specific Gravity, Urine: 1.015 (ref 1.005–1.030)
pH: 6 (ref 5.0–8.0)

## 2016-11-14 LAB — CBC
HCT: 39.3 % (ref 36.0–46.0)
Hemoglobin: 14.2 g/dL (ref 12.0–15.0)
MCH: 32.5 pg (ref 26.0–34.0)
MCHC: 36.1 g/dL — ABNORMAL HIGH (ref 30.0–36.0)
MCV: 89.9 fL (ref 78.0–100.0)
Platelets: 228 10*3/uL (ref 150–400)
RBC: 4.37 MIL/uL (ref 3.87–5.11)
RDW: 12.6 % (ref 11.5–15.5)
WBC: 4.2 10*3/uL (ref 4.0–10.5)

## 2016-11-14 LAB — LIPASE, BLOOD: Lipase: 48 U/L (ref 11–51)

## 2016-11-14 MED ORDER — SODIUM CHLORIDE 0.9 % IV BOLUS (SEPSIS): 1000.0000 mL | Freq: Once | INTRAVENOUS | Status: DC

## 2016-11-14 NOTE — ED Triage Notes (Signed)
Intermittent RUQ abd x 2 weeks. Drank a bottle of magnesium citrate this morning and now pain is worse.

## 2016-11-14 NOTE — ED Notes (Signed)
PT discharged to home with family. NAD. 

## 2016-11-14 NOTE — ED Provider Notes (Signed)
[facility] DEPT [facility] Provider Note   CSN: [id number] Arrival date & time: 11/14/16  1525  By signing my name below, I, Donna [name], attest that this documentation has been prepared under the direction and in the presence of Donna Gambler, MD. Electronically Signed: Mayer [name], Scribe. 11/14/16. 5:21 PM. History   Chief Complaint Chief Complaint  Patient presents with  . Abdominal Pain   The history is provided by the patient and the spouse. No language interpreter was used.    HPI Comments: Donna [name] is a 66 y.o. female with a PMHx of alcoholism who presents to the Emergency Department complaining of waxing/waning, gradually worsening right-sided abdominal pain for 2 weeks. She currently denies any pain. She describes her pain as an ache that is occasionally sharp and does not radiate anywhere. The pain is worse when she lies on her right side. She has tried taking magnesium citrate, which made her symptoms worse. She reports nausea 1-2 days ago but this is likely due to her drinking alcohol. She denies fever, vomiting, constipation, dysuria, and hematuria. Pt has a history of alcoholism with visits in rehab and hospitalizations due to detox. She has no PSHx of abdominal surgery.  Past Medical History:  Diagnosis Date  . Anxiety   . Arthritis   . Diverticula of colon   . GERD (gastroesophageal reflux disease)   . Hiatal hernia   . Hypertension   . IBS (irritable bowel syndrome)     Patient Active Problem List   Diagnosis Date Noted  . Hx of adenomatous colonic polyps 08/03/2015  . Posterior tibial tendonitis of right leg 07/01/2015  . Herpes simplex 06/15/2014  . Spasm of cervical paraspinous muscle 05/12/2014  . GERD (gastroesophageal reflux disease) 10/14/2013  . Anxiety state, unspecified 10/14/2013  . Recovering alcoholic in remission ([place]) 10/14/2013  . Allergic rhinitis 10/14/2013  . Heart murmur 05/25/2012  . Back pain, lumbosacral 01/19/2012  . Hallux  rigidus of right foot 04/08/2011    Past Surgical History:  Procedure Laterality Date  . ANKLE SURGERY     Bilateral   . TUBAL LIGATION      OB History    No data available       Home Medications    Prior to Admission medications   Medication Sig Start Date End Date Taking? Authorizing Provider  propranolol (INDERAL) 10 MG tablet TAKE 1 TABLET(10 MG) BY MOUTH THREE TIMES DAILY 10/30/16  Yes Baxley, Cresenciano Lick, MD  b complex vitamins tablet Take 1 tablet by mouth daily.      [provider]  calcium gluconate 500 MG tablet Take 500 mg by mouth daily.      [provider]  estradiol (ESTRACE) 1 MG tablet Take 1 mg by mouth daily.      [provider]  Estriol 10 % CREA 0.25 mg by Does not apply route 3 (three) times a week. 09/04/16   Elby Showers, MD  Flaxseed, Linseed, (FLAX SEED OIL) 1000 MG CAPS Take 1 capsule by mouth daily.      [provider]  ibuprofen (ADVIL,MOTRIN) 800 MG tablet  07/08/15   [provider]  Magnesium 500 MG TABS Take 1 tablet by mouth daily.      [provider]  Multiple Vitamin (MULTIVITAMIN) capsule Take 1 capsule by mouth daily.      [provider]  NONFORMULARY OR COMPOUNDED ITEM Progesterone 50mg  cap 06/15/14   Baxley, Cresenciano Lick, MD  NONFORMULARY OR COMPOUNDED ITEM  Estradiol/testosterone 0.25-0.25mg /ml cream 06/15/14   Elby Showers, MD  Omega-3 Fatty Acids (OMEGA-3 FISH OIL) 1200 MG CAPS Take 1 capsule by mouth daily.      [provider]  valACYclovir (VALTREX) 500 MG tablet Take 1 tablet (500 mg total) by mouth 2 (two) times daily. Patient not taking: Reported on 10/20/2016 06/19/16   Elby Showers, MD    Family History Family History  Problem Relation Age of Onset  . Colon cancer Father 88       Survived  . Heart disease Father   . Heart attack Father   . Diabetes Mother   . Colon cancer Paternal Grandmother   . Colon polyps Paternal Aunt     Social History Social History    Substance Use Topics  . Smoking status: Former Smoker    Years: 30.00    Types: Cigarettes  . Smokeless tobacco: Never Used  . Alcohol use No     Allergies   Ciprofloxacin; Penicillins; and Quinolones   Review of Systems Review of Systems  Constitutional: Negative for fever.  Gastrointestinal: Positive for abdominal pain and nausea. Negative for constipation and vomiting.  Genitourinary: Negative for dysuria and hematuria.  All other systems reviewed and are negative.    Physical Exam Updated Vital Signs BP (!) 143/78 (BP Location: Right Arm)   Pulse 64   Temp 98.3 F (36.8 C) (Oral)   Resp 18   SpO2 97%   Physical Exam  Constitutional: She is oriented to person, place, and time. She appears well-developed and well-nourished.  HENT:  Head: Normocephalic and atraumatic.  Right Ear: External ear normal.  Left Ear: External ear normal.  Nose: Nose normal.  Eyes: Right eye exhibits no discharge. Left eye exhibits no discharge.  Cardiovascular: Normal rate, regular rhythm and normal heart sounds.   Pulmonary/Chest: Effort normal and breath sounds normal.  Abdominal: Soft. There is tenderness.  Mild, RUQ tenderness, however it seems to not be present on reexaminations No murphy's sign  Neurological: She is alert and oriented to person, place, and time.  Skin: Skin is warm and dry.  Nursing note and vitals reviewed.    ED Treatments / Results  DIAGNOSTIC STUDIES: Oxygen Saturation is 100% on RA, normal by my interpretation.    COORDINATION OF CARE: 5:14 PM Discussed treatment plan with pt at bedside and pt agreed to plan.  Labs (all labs ordered are listed, but only abnormal results are displayed) Labs Reviewed  COMPREHENSIVE METABOLIC PANEL - Abnormal; Notable for the following:       Result Value   Sodium 126 (*)    Chloride 87 (*)    Glucose, Bld 128 (*)    Calcium 8.6 (*)    AST 57 (*)    All other components within normal limits  CBC - Abnormal;  Notable for the following:    MCHC 36.1 (*)    All other components within normal limits  LIPASE, BLOOD  URINALYSIS, ROUTINE W REFLEX MICROSCOPIC    EKG  EKG Interpretation None       Radiology US Abdomen Limited Ruq  Result Date: 11/14/2016 CLINICAL DATA:  Upper abdominal pain for 2 weeks EXAM: ULTRASOUND ABDOMEN LIMITED RIGHT UPPER QUADRANT COMPARISON:  CT abdomen and pelvis July 11, 2015 FINDINGS: Gallbladder: No gallstones or wall thickening visualized. There is no pericholecystic fluid. No sonographic Murphy sign noted by sonographer. Common bile duct: Diameter: 5 mm. No intrahepatic or extrahepatic biliary duct dilatation. Liver: No focal lesion identified.  Liver echogenicity overall is increased. IMPRESSION: Overall increase in liver echogenicity, a finding indicative of hepatic steatosis. While no focal liver lesions are evident on this study, it must be cautioned that the sensitivity of ultrasound for detection of focal liver lesions is diminished in this circumstance. Study otherwise unremarkable. Electronically Signed   By: Lowella Grip III M.D.   On: 11/14/2016 17:47    Procedures Procedures (including critical care time)  Medications Ordered in ED Medications - No data to display   Initial Impression / Assessment and Plan / ED Course  I have reviewed the triage vital signs and the nursing notes.  Pertinent labs & imaging results that were available during my care of the patient were reviewed by me and considered in my medical decision making (see chart for details).     Patient's ultrasound shows possible hepatic steatosis but otherwise no significant pathology including benign gallbladder. On reexam she has mild right upper quadrant tenderness but no further tenderness. My suspicion for an acute intra-abdominal emergency is low. She is overall well appearing. She has not had any nausea, vomiting, or diarrhea/constipation. Oddly her sodium is 126. She is not on any  meds that would obviously cause this. It could be due to alcohol abuse. Last sodium was from 5 months ago, so the chronicity of this is unclear. She appears euvolemic. At this point she has follow-up with her PCP already scheduled in 3 days I think it is reasonable to discharge her home with follow-up then and repeat sodium level. I have counseled her on cutting back for her alcohol abuse. Otherwise, she appears stable for discharge. Discussed strict return per cautions with her and her husband.  Final Clinical Impressions(s) / ED Diagnoses   Final diagnoses:  Right sided abdominal pain  Hyponatremia    New Prescriptions New Prescriptions   No medications on file   I personally performed the services described in this documentation, which was scribed in my presence. The recorded information has been reviewed and is accurate.     Donna Gambler, MD 11/14/16 [id number]

## 2016-11-16 ENCOUNTER — Ambulatory Visit: Payer: Managed Care, Other (non HMO) | Admitting: Internal Medicine

## 2016-11-19 ENCOUNTER — Encounter: Payer: Self-pay | Admitting: Internal Medicine

## 2016-11-19 ENCOUNTER — Ambulatory Visit (INDEPENDENT_AMBULATORY_CARE_PROVIDER_SITE_OTHER): Payer: Managed Care, Other (non HMO) | Admitting: Internal Medicine

## 2016-11-19 ENCOUNTER — Other Ambulatory Visit: Payer: Self-pay | Admitting: Internal Medicine

## 2016-11-19 VITALS — BP 160/98 | HR 64 | Temp 97.5°F | Wt 164.0 lb

## 2016-11-19 DIAGNOSIS — F10230 Alcohol dependence with withdrawal, uncomplicated: Secondary | ICD-10-CM

## 2016-11-19 DIAGNOSIS — E869 Volume depletion, unspecified: Secondary | ICD-10-CM | POA: Diagnosis not present

## 2016-11-19 DIAGNOSIS — E871 Hypo-osmolality and hyponatremia: Secondary | ICD-10-CM | POA: Diagnosis not present

## 2016-11-19 DIAGNOSIS — R109 Unspecified abdominal pain: Secondary | ICD-10-CM

## 2016-11-19 DIAGNOSIS — F10930 Alcohol use, unspecified with withdrawal, uncomplicated: Secondary | ICD-10-CM

## 2016-11-19 LAB — COMPLETE METABOLIC PANEL WITH GFR
ALT: 79 U/L — ABNORMAL HIGH (ref 6–29)
AST: 60 U/L — ABNORMAL HIGH (ref 10–35)
Albumin: 4.4 g/dL (ref 3.6–5.1)
Alkaline Phosphatase: 70 U/L (ref 33–130)
BUN: 7 mg/dL (ref 7–25)
CO2: 23 mmol/L (ref 20–31)
Calcium: 8.9 mg/dL (ref 8.6–10.4)
Chloride: 92 mmol/L — ABNORMAL LOW (ref 98–110)
Creat: 0.54 mg/dL (ref 0.50–0.99)
GFR, Est African American: >89 mL/min (ref >=60)
GFR, Est Non African American: >89 mL/min (ref >=60)
Glucose, Bld: 106 mg/dL — ABNORMAL HIGH (ref 65–99)
Potassium: 4.1 mmol/L (ref 3.5–5.3)
Sodium: 126 mmol/L — ABNORMAL LOW (ref 135–146)
Total Bilirubin: 0.7 mg/dL (ref 0.2–1.2)
Total Protein: 6.9 g/dL (ref 6.1–8.1)

## 2016-11-19 LAB — POCT URINALYSIS DIPSTICK
Bilirubin, UA: NEGATIVE
Glucose, UA: NEGATIVE
Ketones, UA: NEGATIVE
Leukocytes, UA: NEGATIVE
Nitrite, UA: NEGATIVE
Protein, UA: NEGATIVE
Spec Grav, UA: 1.02 (ref 1.010–1.025)
Urobilinogen, UA: 0.2 E.U./dL
pH, UA: 7.5 (ref 5.0–8.0)

## 2016-11-19 LAB — AMYLASE: Amylase: 37 U/L (ref 21–101)

## 2016-11-19 LAB — LIPASE: Lipase: 24 U/L (ref 7–60)

## 2016-11-19 MED ORDER — CHLORDIAZEPOXIDE HCL 10 MG PO CAPS: 10.0000 mg | ORAL_CAPSULE | Freq: Three times a day (TID) | ORAL | 0 refills | Status: DC | PRN

## 2016-11-20 LAB — CBC WITH DIFFERENTIAL/PLATELET
Basophils Absolute: 0 cells/uL (ref 0–200)
Basophils Relative: 0 %
Eosinophils Absolute: 0 cells/uL — ABNORMAL LOW (ref 15–500)
Eosinophils Relative: 0 %
HCT: 38.8 % (ref 35.0–45.0)
Hemoglobin: 13.3 g/dL (ref 11.7–15.5)
Lymphocytes Relative: 28 %
Lymphs Abs: 1120 cells/uL (ref 850–3900)
MCH: 32.1 pg (ref 27.0–33.0)
MCHC: 34.3 g/dL (ref 32.0–36.0)
MCV: 93.7 fL (ref 80.0–100.0)
MPV: 9.2 fL (ref 7.5–12.5)
Monocytes Absolute: 400 cells/uL (ref 200–950)
Monocytes Relative: 10 %
Neutro Abs: 2480 cells/uL (ref 1500–7800)
Neutrophils Relative %: 62 %
Platelets: 164 10*3/uL (ref 140–400)
RBC: 4.14 MIL/uL (ref 3.80–5.10)
RDW: 13.8 % (ref 11.0–15.0)
WBC: 4 10*3/uL (ref 3.8–10.8)

## 2016-11-22 NOTE — Patient Instructions (Signed)
Librium 10 mg up to 3 times daily for alcohol withdrawal symptoms. Continue propranolol. Please stop drinking and began to hydrate yourself. Call your [place] sponsor.

## 2016-11-22 NOTE — Progress Notes (Signed)
Subjective:    Patient ID: Donna [name], female    DOB: 1951/02/10, 66 y.o.   MRN: [id number]  HPI 66 year old Female with history of alcohol abuse status post Admission for detox at [facility] July 2017, admission to [facility] December 2017 for 3 days with alcohol withdrawal symptoms, leukopenia, depression and mood disorder. Also had abnormal liver functions and thrombocytopenia. Was discharged to [facility] on lorazepam. She completed program at [facility] and was placed on naltrexone which she subsequently weaned herself off of. She was on propranolol 10 mg daily which she has continued on.  She says these relapses seem to occur when she goes to visit her family.  Her husband discovered that she had been drinking recently and he threatened to leave her.  She says the past several days she's been drinking wine. Says that she had 9 glasses just her today. She is tremulous in the office today. However, she seems to be thinking clearly. She's complaining of some right upper quadrant abdominal pain but had negative ultrasound in the emergency department on July 7. She was found to have hyponatremia there which was likely related to drinking alcohol. Lipase was normal in the emergency department. BUN and creatinine were normal. Nonfasting glucose was 128. CBC was essentially normal.  Today we drew amylase lipase, CBC, C met. She had unremarkable urinalysis on July 7. Urine dipstick today shows specific gravity 1.020. Liver functions were elevated. SGOT was 60 and SGPT was 79. On July 7 SGOT was 57 and SGPT was 51.  We had a long discussion about her drinking, relapses and reasons for relapses. She doesn't want her husband to find out that she's been drinking once again. I agreed to give her a short course of Librium 10 mg 3 times daily. Instructed her not to take this if she is going to drink alcohol. She needs to contact her [place] sponsor immediately.  She says she will do that. I do not think she has pancreatitis. She likely has alcoholic hepatitis. She's beginning to show signs of withdrawal with the tremulousness.          Review of Systems see above     Objective:   Physical Exam She is tremulous here in the office but her thoughts are appropriate. Not showing any signs of hallucinations. Skin is warm and dry. Nodes none. Chest clear. Cardiac exam regular rate and rhythm normal S1 and S2. Abdomen mild right upper quadrant tenderness without rebound tenderness. Extremities without edema. She is not ataxic       Assessment & Plan:  Exhibiting signs of alcohol withdrawal with tremulousness even though she's had 3 glasses of wine today  History of alcohol abuse  History of depression  Hyponatremia  Suspect volume depletion with specific gravity 1.020  Plan: She is to take Librium sparingly for withdrawal symptoms and try not to drink anymore today and to call her [place] sponsor. We will call her tomorrow for follow-up.  Addendum: See note 7/13 for Donna [name], Donna [name]. Patient says she is feeling better. If abdominal pain persist, she'll need CT of the abdomen and pelvis. Follow-up in one week.

## 2016-11-24 ENCOUNTER — Other Ambulatory Visit: Payer: Self-pay | Admitting: Internal Medicine

## 2016-11-27 ENCOUNTER — Ambulatory Visit (INDEPENDENT_AMBULATORY_CARE_PROVIDER_SITE_OTHER): Payer: Managed Care, Other (non HMO) | Admitting: Internal Medicine

## 2016-11-27 ENCOUNTER — Encounter: Payer: Self-pay | Admitting: Internal Medicine

## 2016-11-27 VITALS — BP 120/68 | HR 55 | Temp 97.8°F | Wt 165.0 lb

## 2016-11-27 DIAGNOSIS — E871 Hypo-osmolality and hyponatremia: Secondary | ICD-10-CM

## 2016-11-27 DIAGNOSIS — R945 Abnormal results of liver function studies: Principal | ICD-10-CM

## 2016-11-27 DIAGNOSIS — R7989 Other specified abnormal findings of blood chemistry: Secondary | ICD-10-CM | POA: Diagnosis not present

## 2016-11-27 LAB — COMPLETE METABOLIC PANEL WITH GFR
ALT: 62 U/L — ABNORMAL HIGH (ref 6–29)
AST: 31 U/L (ref 10–35)
Albumin: 3.9 g/dL (ref 3.6–5.1)
Alkaline Phosphatase: 65 U/L (ref 33–130)
BUN: 12 mg/dL (ref 7–25)
CO2: 21 mmol/L (ref 20–31)
Calcium: 8.9 mg/dL (ref 8.6–10.4)
Chloride: 99 mmol/L (ref 98–110)
Creat: 0.72 mg/dL (ref 0.50–0.99)
GFR, Est African American: >89 mL/min (ref >=60)
GFR, Est Non African American: 89 mL/min (ref >=60)
Glucose, Bld: 96 mg/dL (ref 65–99)
Potassium: 4.1 mmol/L (ref 3.5–5.3)
Sodium: 133 mmol/L — ABNORMAL LOW (ref 135–146)
Total Bilirubin: 0.4 mg/dL (ref 0.2–1.2)
Total Protein: 6.4 g/dL (ref 6.1–8.1)

## 2016-11-27 NOTE — Patient Instructions (Signed)
Lab work drawn and pending. Return in November for follow-up.

## 2016-11-27 NOTE — Progress Notes (Signed)
   Subjective:    Patient ID: Donna [name], female    DOB: [birth year], 66 y.o.   MRN: [id number]  HPI 66 year old Female seen last week on July 12 having had relapse in recovery from alcoholism. She was found to be hyponatremic with Sodium of 126. This was repeated today. Amylase and lipase were normal. Liver functions were elevated with SGOT being 60 and SGPT being 79. Bilirubin was normal and alkaline phosphatase was normal. BUN and creatinine were normal.  She was treated with Librium for alcohol withdrawal symptoms. She was tremulous. She was also continued on propranolol which she is been taking for while since having been through detox and counseling at [facility]. She was able to successfully quit drinking as an outpatient and she is here today for follow-up. She feels much better. She is not tremulous. Sodium and liver functions are being checked today.  No nausea or vomiting.    Review of Systems still has twinges of right upper quadrant pain. I suspect she had mild alcoholic hepatitis. If symptoms persist we will pursue CT or ultrasound of the abdomen. She goes on Medicare in August.     Objective:   Physical Exam  Spent 15 minutes with patient today.      Assessment & Plan:  Relapse in alcohol recovery Elevated liver functions Hyponatremia  Plan: Patient improved and is to have follow-up visit in November.

## 2016-12-02 ENCOUNTER — Encounter: Payer: Self-pay | Admitting: Physician Assistant

## 2016-12-07 ENCOUNTER — Encounter: Payer: Self-pay | Admitting: Physician Assistant

## 2016-12-07 ENCOUNTER — Ambulatory Visit (INDEPENDENT_AMBULATORY_CARE_PROVIDER_SITE_OTHER): Payer: Managed Care, Other (non HMO) | Admitting: Physician Assistant

## 2016-12-07 VITALS — BP 124/78 | HR 61 | Ht 67.0 in | Wt 163.0 lb

## 2016-12-07 DIAGNOSIS — K7010 Alcoholic hepatitis without ascites: Secondary | ICD-10-CM

## 2016-12-07 DIAGNOSIS — R7989 Other specified abnormal findings of blood chemistry: Secondary | ICD-10-CM | POA: Diagnosis not present

## 2016-12-07 DIAGNOSIS — R1011 Right upper quadrant pain: Secondary | ICD-10-CM

## 2016-12-07 DIAGNOSIS — R945 Abnormal results of liver function studies: Principal | ICD-10-CM

## 2016-12-07 NOTE — Patient Instructions (Signed)
Follow up with Nicoletta Ba PA or Dr. Fuller Plan as needed.

## 2016-12-07 NOTE — Progress Notes (Signed)
Many thanks. 

## 2016-12-07 NOTE — Progress Notes (Signed)
Reviewed and agree with initial management plan.  Malcolm T. Stark, MD FACG 

## 2016-12-07 NOTE — Progress Notes (Signed)
Subjective:    Patient ID: Donna [name], female    DOB: [birth year], 66 y.o.   MRN: [id number]  HPI Donna [name] is a pleasant 66 year old white female known to Dr. Fuller Plan who is referred today by Dr. Renold Genta for evaluation of recent right upper quadrant pain. Patient has history of GERD, anxiety, EtOH abuse and adenomatous colon polyps. Patient had an admission in December 2017 [facility] with depression, elevated LFTs and EtOH withdrawal. She was subsequently admitted for a 30 day program at [facility]. She had been doing well until June and then had a relapse with EtOH abuse. She says she was drinking pretty heavily on a daily basis for couple of weeks prior to ER visit on 11/14/2016 with complaints of right upper quadrant pain. At that time she underwent upper abdominal ultrasound which showed hepatic steatosis no definite cirrhosis, no gallstones, CBD of 5 mm. She is followed up with Dr. Renold Genta since, has gone through detox and has not had any alcohol over the past couple of weeks. She says she's not having any further right upper quadrant discomfort. She apparently was also having some diarrhea around that time as well and believes that all of this was secondary to significant EtOH intake. She says her appetite is good currently no complaints of nausea ,bowel movements are normal and actually has been feeling well. She was also hyponatremic at the time of that ER visit with sodium of 126.  Labs on 11/27/2016 with sodium was 133 AST 31 ALT of 62 albumin 3.9.  Review of Systems  Pertinent positive and negative review of systems were noted in the above HPI section.  All other review of systems was otherwise negative.  Outpatient Encounter Prescriptions as of 12/07/2016  Medication Sig  . b complex vitamins tablet Take 1 tablet by mouth daily.    . calcium gluconate 500 MG tablet Take 500 mg by mouth daily.    Marland Kitchen estradiol (ESTRACE) 1 MG tablet Take 1 mg by mouth daily.    . Estriol 10 % CREA  0.25 mg by Does not apply route 3 (three) times a week.  . Flaxseed, Linseed, (FLAX SEED OIL) 1000 MG CAPS Take 1 capsule by mouth daily.    . Magnesium 500 MG TABS Take 1 tablet by mouth daily.    . Multiple Vitamin (MULTIVITAMIN) capsule Take 1 capsule by mouth daily.    . NONFORMULARY OR COMPOUNDED ITEM Progesterone 50mg  cap  . NONFORMULARY OR COMPOUNDED ITEM Estradiol/testosterone 0.25-0.25mg /ml cream  . Omega-3 Fatty Acids (OMEGA-3 FISH OIL) 1200 MG CAPS Take 1 capsule by mouth daily.    . propranolol (INDERAL) 10 MG tablet TAKE 1 TABLET(10 MG) BY MOUTH THREE TIMES DAILY  . valACYclovir (VALTREX) 500 MG tablet TAKE 1 TABLET(500 MG) BY MOUTH TWICE DAILY  . [DISCONTINUED] chlordiazePOXIDE (LIBRIUM) 10 MG capsule Take 1 capsule (10 mg total) by mouth 3 (three) times daily as needed for anxiety.  . [DISCONTINUED] ibuprofen (ADVIL,MOTRIN) 800 MG tablet    No facility-administered encounter medications on file as of 12/07/2016.    Allergies  Allergen Reactions  . Ciprofloxacin   . Gluten Meal     GI upset   . Penicillins Nausea And Vomiting    "breaking out"  . Quinolones     REACTION: sick   Patient Active Problem List   Diagnosis Date Noted  . Hx of adenomatous colonic polyps 08/03/2015  . Posterior tibial tendonitis of right leg 07/01/2015  . Herpes simplex 06/15/2014  .  Spasm of cervical paraspinous muscle 05/12/2014  . GERD (gastroesophageal reflux disease) 10/14/2013  . Anxiety state, unspecified 10/14/2013  . Recovering alcoholic in remission ([place]) 10/14/2013  . Allergic rhinitis 10/14/2013  . Heart murmur 05/25/2012  . Back pain, lumbosacral 01/19/2012  . Hallux rigidus of right foot 04/08/2011   Social History   Social History  . Marital status: Married    Spouse name: N/A  . Number of children: 0  . Years of education: N/A   Occupational History  . [employer]    Social History Main Topics  . Smoking status: Former Smoker    Years: 30.00    Types: Cigarettes    . Smokeless tobacco: Never Used  . Alcohol use No  . Drug use: No  . Sexual activity: Yes   Other Topics Concern  . Not on file   Social History Narrative   2 caffeine drinks daily          Ms. Jalomo's family history includes Colon cancer in her paternal grandmother; Colon cancer (age of onset: 42) in her father; Colon polyps in her paternal aunt; Diabetes in her mother; Heart attack in her father; Heart disease in her father.      Objective:    Vitals:   12/07/16 1103  BP: 124/78  Pulse: 61    Physical Exam  well-developed older white female in no acute distress, very pleasant blood pressure 124/78 pulse 61, height 5 foot 7, weight 163, BMI 25.5. HEENT ;nontraumatic normocephalic EOMI PERRLA sclera anicteric,CV;regular rate and rhythm with S1-S2 no murmur or gallop, Pulmonary; clear bilaterally, Abdomen ;large, soft no palpable mass or hepatosplenomegaly bowel sounds are present she is nontender, rectal exam not done, Extremities ;no clubbing cyanosis or edema skin warm and dry, Neuropsych ;mood and affect appropriate       Assessment & Plan:   #93 66 year old white female with recent episode of right upper quadrant pain in setting of heavy EtOH use during relapse from recovery. No evidence for cholelithiasis by ultrasound. She did have mild transaminitis and suspect she may have had a mild alcohol-induced hepatitis now resolving #2 alcoholism #3 history of adenomatous colon polyps-up-to-date with colonoscopy last done November 2016 due for follow-up in 5 years #4 hepatic steatosis-no cirrhosis by ultrasound or labs #5 transient hyponatremia secondary to EtOH abuse  Plan; Discussion today regarding alcoholic hepatitis and potential for severe disease. Patient is commended for stopping EtOH. She says she has a sponsor now and feels confident she can stay off of alcohol. She plans to follow up with Dr. Renold Genta in 2 weeks and can repeat labs at that time. Would also check for  hepatitis C at the time of next lab draw. Patient will be due for follow-up colonoscopy 2021. In the interim patient may follow up with Dr. Fuller Plan myself on an as-needed basis.    Rowen Hur S Amier Hoyt PA-C 12/07/2016   Cc: Elby Showers, MD

## 2016-12-14 ENCOUNTER — Other Ambulatory Visit: Payer: Self-pay | Admitting: Internal Medicine

## 2016-12-18 ENCOUNTER — Telehealth: Payer: Self-pay | Admitting: Internal Medicine

## 2016-12-18 ENCOUNTER — Encounter: Payer: Self-pay | Admitting: Internal Medicine

## 2016-12-18 ENCOUNTER — Other Ambulatory Visit: Payer: Self-pay | Admitting: Internal Medicine

## 2016-12-18 ENCOUNTER — Ambulatory Visit (INDEPENDENT_AMBULATORY_CARE_PROVIDER_SITE_OTHER): Payer: Medicare HMO | Admitting: Internal Medicine

## 2016-12-18 VITALS — BP 100/60 | HR 71 | Temp 97.8°F | Wt 162.0 lb

## 2016-12-18 DIAGNOSIS — F1021 Alcohol dependence, in remission: Secondary | ICD-10-CM | POA: Diagnosis not present

## 2016-12-18 DIAGNOSIS — R69 Illness, unspecified: Secondary | ICD-10-CM | POA: Diagnosis not present

## 2016-12-18 DIAGNOSIS — F1020 Alcohol dependence, uncomplicated: Secondary | ICD-10-CM | POA: Diagnosis not present

## 2016-12-18 MED ORDER — CHLORDIAZEPOXIDE HCL 10 MG PO CAPS: 10.0000 mg | ORAL_CAPSULE | Freq: Three times a day (TID) | ORAL | 0 refills | Status: DC | PRN

## 2016-12-18 MED ORDER — NALTREXONE HCL 50 MG PO TABS: 50.0000 mg | ORAL_TABLET | Freq: Every day | ORAL | 0 refills | Status: DC

## 2016-12-18 NOTE — Telephone Encounter (Signed)
She needs OV today.

## 2016-12-18 NOTE — Progress Notes (Signed)
   Subjective:    Patient ID: Donna [name], female    DOB: [birth year], 66 y.o.   MRN: [id number]  HPI 66 year old Female who will turn 35 tomorrow has unfortunately started drinking once again. She was seen here July 12 with an alcohol relapse. She did not want to go back into treatment. Propranolol was restarted and she was given Librium 10 mg 3 times daily as needed for anxiety related to alcohol withdrawal. Saw her in follow-up July 20 and things were better for her.  Patient says that generally visits with her family cause her to relapse. Patient feels at husband has been a bit too controlling but he is frustrated with her drinking. They have been married 28 years.  Refuses to go back into treatment or even consider it. Has contacted her sponsor but has not actually been to any AA meetings. Has had several drinks today. Alcohol level is 87.  We had a long discussion about her alcoholism today and the various attempts at trying to detox. I am frustrated she has not been to any AA meetings. She seems to think she can do this on her own but I confronted her about this and told her  that is unlikely she can stay sober without some help. She is very adamant about not returning to inpatient care.       Review of Systems see above she's not very tremulous and she is lucid     Objective:   Physical Exam  Vital signs reviewed and spent 20 minutes speaking with her      Assessment & Plan:  Alcohol relapse  Plan: Librium 10 mg 1 by mouth 3 times daily for 5 days no refill. Continue propranolol 10 mg 3 times daily. Please contact [place] sponsor and quit drinking

## 2016-12-18 NOTE — Telephone Encounter (Signed)
appt made for today 

## 2016-12-18 NOTE — Telephone Encounter (Signed)
Patient calling asking for prescriptions for Librium and the medication that you talked to her about that stops cravings.  She said that you haven't prescribed it yet.  She should've taken you up on both of these medications when she was here.  She needs both PLEASE.  She said that she got in trouble and she needs to detox.    Pharmacy:  [place] # for contact:  [phone]  Thank you.

## 2016-12-21 ENCOUNTER — Ambulatory Visit: Payer: Managed Care, Other (non HMO) | Admitting: Internal Medicine

## 2016-12-21 LAB — ETHANOL, CLINICAL
Alcohol, Ethyl (B): 0.087 g/dL(%) — ABNORMAL HIGH
Alcohol, Ethyl (B): 87 mg/dL — ABNORMAL HIGH

## 2016-12-22 ENCOUNTER — Ambulatory Visit: Payer: Managed Care, Other (non HMO) | Admitting: Internal Medicine

## 2016-12-24 DIAGNOSIS — L570 Actinic keratosis: Secondary | ICD-10-CM | POA: Diagnosis not present

## 2016-12-24 DIAGNOSIS — D229 Melanocytic nevi, unspecified: Secondary | ICD-10-CM | POA: Diagnosis not present

## 2016-12-24 DIAGNOSIS — B079 Viral wart, unspecified: Secondary | ICD-10-CM | POA: Diagnosis not present

## 2016-12-25 ENCOUNTER — Ambulatory Visit: Payer: Managed Care, Other (non HMO) | Admitting: Internal Medicine

## 2016-12-26 NOTE — Patient Instructions (Signed)
Please contact [place] sponsor and quit drinking. Librium 10 mg 3 times daily for 5 days. Continue propranolol 10 mg 3 times daily. Need to start attending [place] meetings.

## 2017-01-04 ENCOUNTER — Encounter: Payer: Self-pay | Admitting: Internal Medicine

## 2017-01-04 ENCOUNTER — Ambulatory Visit (INDEPENDENT_AMBULATORY_CARE_PROVIDER_SITE_OTHER): Payer: Medicare HMO | Admitting: Internal Medicine

## 2017-01-04 VITALS — BP 104/82 | HR 71 | Temp 97.7°F | Wt 163.0 lb

## 2017-01-04 DIAGNOSIS — E871 Hypo-osmolality and hyponatremia: Secondary | ICD-10-CM

## 2017-01-04 DIAGNOSIS — F1021 Alcohol dependence, in remission: Secondary | ICD-10-CM

## 2017-01-04 DIAGNOSIS — R945 Abnormal results of liver function studies: Secondary | ICD-10-CM

## 2017-01-04 DIAGNOSIS — R69 Illness, unspecified: Secondary | ICD-10-CM | POA: Diagnosis not present

## 2017-01-04 DIAGNOSIS — R7989 Other specified abnormal findings of blood chemistry: Secondary | ICD-10-CM | POA: Diagnosis not present

## 2017-01-04 NOTE — Progress Notes (Signed)
   Subjective:    Patient ID: Donna [name], female    DOB: 04-Oct-1950, 66 y.o.   MRN: [id number]  HPI She says she's not been consuming alcohol since her last visit. She brings in a log today signed by various people indicating she's been to at least 8 Alcoholics Anonymous meetings since last visit starting around August 14. Some meetings have been more suited to her than others. She is finding her stride with regard to the meetings she likes. Says husband suspected that she was drinking. She is also had a girlfriend stay with her for a week and a half who apparently needed a place to stay for short while. We spoke about sending limits and trying not to take on too much. 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  At visit July 20, sodium was 133, SGPT was 62. On July 12, sodium was 126, chloride 92, SGOT 16 SGPT 79.    Review of Systems see above     Objective:   Physical Exam Spent 15 minutes speaking with her today about these issues.. Encouraged her to continue to go to  [employer]. Am pleased with her progress.       Assessment & Plan:  Alcoholic in recovery  Hyponatremia  Elevated liver functions  Plan: Return in 4 weeks. She does not want to go to inpatient therapy. Complete metabolic panel drawn today

## 2017-01-04 NOTE — Patient Instructions (Signed)
Complete metabolic panel drawn. Continue to go to [place] meetings in follow-up in 4 weeks.

## 2017-01-05 LAB — COMPLETE METABOLIC PANEL WITH GFR
ALT: 19 U/L (ref 6–29)
AST: 19 U/L (ref 10–35)
Albumin: 4 g/dL (ref 3.6–5.1)
Alkaline Phosphatase: 50 U/L (ref 33–130)
BUN: 12 mg/dL (ref 7–25)
CO2: 23 mmol/L (ref 20–32)
Calcium: 9 mg/dL (ref 8.6–10.4)
Chloride: 101 mmol/L (ref 98–110)
Creat: 0.65 mg/dL (ref 0.50–0.99)
GFR, Est African American: >89 mL/min (ref >=60)
GFR, Est Non African American: >89 mL/min (ref >=60)
Glucose, Bld: 95 mg/dL (ref 65–99)
Potassium: 4.4 mmol/L (ref 3.5–5.3)
Sodium: 135 mmol/L (ref 135–146)
Total Bilirubin: 0.5 mg/dL (ref 0.2–1.2)
Total Protein: 6.2 g/dL (ref 6.1–8.1)

## 2017-01-10 ENCOUNTER — Other Ambulatory Visit: Payer: Self-pay | Admitting: Internal Medicine

## 2017-02-01 ENCOUNTER — Ambulatory Visit: Payer: Medicare HMO | Admitting: Internal Medicine

## 2017-02-08 DIAGNOSIS — H2512 Age-related nuclear cataract, left eye: Secondary | ICD-10-CM | POA: Diagnosis not present

## 2017-02-09 DIAGNOSIS — H2511 Age-related nuclear cataract, right eye: Secondary | ICD-10-CM | POA: Diagnosis not present

## 2017-02-11 ENCOUNTER — Other Ambulatory Visit: Payer: Self-pay | Admitting: Internal Medicine

## 2017-03-01 ENCOUNTER — Encounter: Payer: Self-pay | Admitting: Internal Medicine

## 2017-03-01 ENCOUNTER — Ambulatory Visit (INDEPENDENT_AMBULATORY_CARE_PROVIDER_SITE_OTHER): Payer: Medicare HMO | Admitting: Internal Medicine

## 2017-03-01 VITALS — BP 110/80 | HR 62 | Temp 98.0°F | Ht 67.0 in | Wt 158.0 lb

## 2017-03-01 DIAGNOSIS — H00022 Hordeolum internum right lower eyelid: Secondary | ICD-10-CM

## 2017-03-01 DIAGNOSIS — F1021 Alcohol dependence, in remission: Secondary | ICD-10-CM | POA: Diagnosis not present

## 2017-03-01 DIAGNOSIS — R69 Illness, unspecified: Secondary | ICD-10-CM | POA: Diagnosis not present

## 2017-03-01 NOTE — Patient Instructions (Signed)
Continue Inderal for tremor.  Return in February for physical examination.  Have cataract surgery in December.

## 2017-03-01 NOTE — Addendum Note (Signed)
Addended by: Elby Showers on: 03/01/2017 10:35 AM   Modules accepted: Level of Service

## 2017-03-01 NOTE — Progress Notes (Addendum)
   Subjective:    Patient ID: Donna [name], female    DOB: 1950-09-11, 66 y.o.   MRN: [id number]  HPI 66 year old Female for follow up. She is an alcoholic in recovery.  Has Hordeolum right eye lower lid. Seeing optometrist. Dr. Talbert Forest removed cataract left eye October 1st. Will have right eye done in December.  Has new sponsor for [place]. Going to different meetings. Has to check in weekly and go to a meeting weekly with this new sponsor whose name is Elmyra Ricks. 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 Seems happier.Relationship with husband better.  Has appt here for CPE in February.  Review of Systems see above     Objective:   Physical Exam  Eyes:  Hordeolum right lower lid  Cardiovascular: Normal rate, regular rhythm and normal heart sounds.   Pulmonary/Chest: Effort normal and breath sounds normal. She has no wheezes.  Musculoskeletal: She exhibits no edema.  Psychiatric: She has a normal mood and affect. Her behavior is normal. Judgment and thought content normal.  Vitals reviewed.         Assessment & Plan:  Alcoholism in recovery-spent 25 minutes with her speaking about this issue.  She seems to be markedly improved.  Hordeolum right eye  Cataract right eye-to have surgery in December  History of elevated liver functions related to drinking alcohol.  These were normal in August.  Plan: Optometrist is to see her today regarding hordeolum.  She will continue propranolol with history of tremor.  No longer taking naltrexone.  Follow-up in February for physical examination.  To have cataract surgery right eye in December

## 2017-03-26 ENCOUNTER — Ambulatory Visit: Payer: Medicare HMO | Admitting: Internal Medicine

## 2017-04-06 DIAGNOSIS — M9902 Segmental and somatic dysfunction of thoracic region: Secondary | ICD-10-CM | POA: Diagnosis not present

## 2017-04-06 DIAGNOSIS — M9905 Segmental and somatic dysfunction of pelvic region: Secondary | ICD-10-CM | POA: Diagnosis not present

## 2017-04-06 DIAGNOSIS — M9903 Segmental and somatic dysfunction of lumbar region: Secondary | ICD-10-CM | POA: Diagnosis not present

## 2017-04-06 DIAGNOSIS — M7071 Other bursitis of hip, right hip: Secondary | ICD-10-CM | POA: Diagnosis not present

## 2017-04-13 DIAGNOSIS — M9902 Segmental and somatic dysfunction of thoracic region: Secondary | ICD-10-CM | POA: Diagnosis not present

## 2017-04-13 DIAGNOSIS — M9903 Segmental and somatic dysfunction of lumbar region: Secondary | ICD-10-CM | POA: Diagnosis not present

## 2017-04-13 DIAGNOSIS — M7071 Other bursitis of hip, right hip: Secondary | ICD-10-CM | POA: Diagnosis not present

## 2017-04-13 DIAGNOSIS — M9905 Segmental and somatic dysfunction of pelvic region: Secondary | ICD-10-CM | POA: Diagnosis not present

## 2017-04-20 ENCOUNTER — Other Ambulatory Visit: Payer: Self-pay | Admitting: Internal Medicine

## 2017-04-20 DIAGNOSIS — M9902 Segmental and somatic dysfunction of thoracic region: Secondary | ICD-10-CM | POA: Diagnosis not present

## 2017-04-20 DIAGNOSIS — M9905 Segmental and somatic dysfunction of pelvic region: Secondary | ICD-10-CM | POA: Diagnosis not present

## 2017-04-20 DIAGNOSIS — M9903 Segmental and somatic dysfunction of lumbar region: Secondary | ICD-10-CM | POA: Diagnosis not present

## 2017-04-20 DIAGNOSIS — M7071 Other bursitis of hip, right hip: Secondary | ICD-10-CM | POA: Diagnosis not present

## 2017-04-24 ENCOUNTER — Telehealth: Payer: Self-pay | Admitting: Internal Medicine

## 2017-04-24 ENCOUNTER — Encounter: Payer: Self-pay | Admitting: Internal Medicine

## 2017-04-24 ENCOUNTER — Other Ambulatory Visit: Payer: Self-pay | Admitting: Internal Medicine

## 2017-04-24 NOTE — Telephone Encounter (Signed)
Needs refill on Progesterone. [facility]. RF x one year.

## 2017-04-26 DIAGNOSIS — H2511 Age-related nuclear cataract, right eye: Secondary | ICD-10-CM | POA: Diagnosis not present

## 2017-04-26 DIAGNOSIS — H25011 Cortical age-related cataract, right eye: Secondary | ICD-10-CM | POA: Diagnosis not present

## 2017-04-26 DIAGNOSIS — Z961 Presence of intraocular lens: Secondary | ICD-10-CM | POA: Diagnosis not present

## 2017-04-29 ENCOUNTER — Other Ambulatory Visit: Payer: Self-pay

## 2017-04-29 ENCOUNTER — Other Ambulatory Visit: Payer: Self-pay | Admitting: Internal Medicine

## 2017-04-29 MED ORDER — PROGESTERONE MICRONIZED 100 MG PO CAPS: ORAL_CAPSULE | ORAL | 0 refills | Status: DC

## 2017-04-30 NOTE — Telephone Encounter (Signed)
Please call [facility] and take care of this. I have dealt with it several times. Now requesting 90 day supply. I believe it is a different preparation than this.

## 2017-04-30 NOTE — Telephone Encounter (Signed)
Patient's pharmacy called progesterone is av in 100mg  and 200mg  capsules only, they received a fax from [place] for 50mg . Is it okay to change it to 100mg  capsules qhs?

## 2017-05-07 DIAGNOSIS — M9902 Segmental and somatic dysfunction of thoracic region: Secondary | ICD-10-CM | POA: Diagnosis not present

## 2017-05-07 DIAGNOSIS — M9905 Segmental and somatic dysfunction of pelvic region: Secondary | ICD-10-CM | POA: Diagnosis not present

## 2017-05-07 DIAGNOSIS — M9903 Segmental and somatic dysfunction of lumbar region: Secondary | ICD-10-CM | POA: Diagnosis not present

## 2017-05-07 DIAGNOSIS — M7071 Other bursitis of hip, right hip: Secondary | ICD-10-CM | POA: Diagnosis not present

## 2017-05-13 DIAGNOSIS — M9902 Segmental and somatic dysfunction of thoracic region: Secondary | ICD-10-CM | POA: Diagnosis not present

## 2017-05-13 DIAGNOSIS — M9905 Segmental and somatic dysfunction of pelvic region: Secondary | ICD-10-CM | POA: Diagnosis not present

## 2017-05-13 DIAGNOSIS — M9903 Segmental and somatic dysfunction of lumbar region: Secondary | ICD-10-CM | POA: Diagnosis not present

## 2017-05-13 DIAGNOSIS — M7071 Other bursitis of hip, right hip: Secondary | ICD-10-CM | POA: Diagnosis not present

## 2017-05-14 ENCOUNTER — Other Ambulatory Visit: Payer: Self-pay | Admitting: Internal Medicine

## 2017-05-14 DIAGNOSIS — Z131 Encounter for screening for diabetes mellitus: Secondary | ICD-10-CM

## 2017-05-14 DIAGNOSIS — Z1321 Encounter for screening for nutritional disorder: Secondary | ICD-10-CM

## 2017-05-14 DIAGNOSIS — Z0000 Encounter for general adult medical examination without abnormal findings: Secondary | ICD-10-CM

## 2017-05-14 DIAGNOSIS — Z1329 Encounter for screening for other suspected endocrine disorder: Secondary | ICD-10-CM

## 2017-05-14 DIAGNOSIS — Z13220 Encounter for screening for lipoid disorders: Secondary | ICD-10-CM

## 2017-05-18 ENCOUNTER — Ambulatory Visit
Admission: RE | Admit: 2017-05-18 | Discharge: 2017-05-18 | Disposition: A | Discharge status: Home / Self Care | Payer: Medicare HMO | Source: Ambulatory Visit | Attending: Internal Medicine | Admitting: Internal Medicine

## 2017-05-18 ENCOUNTER — Encounter: Payer: Self-pay | Admitting: Internal Medicine

## 2017-05-18 ENCOUNTER — Ambulatory Visit (INDEPENDENT_AMBULATORY_CARE_PROVIDER_SITE_OTHER): Payer: Medicare HMO | Admitting: Internal Medicine

## 2017-05-18 VITALS — BP 110/82 | HR 72 | Temp 98.0°F | Ht 67.0 in | Wt 160.0 lb

## 2017-05-18 DIAGNOSIS — Z1329 Encounter for screening for other suspected endocrine disorder: Secondary | ICD-10-CM

## 2017-05-18 DIAGNOSIS — Z1159 Encounter for screening for other viral diseases: Secondary | ICD-10-CM | POA: Diagnosis not present

## 2017-05-18 DIAGNOSIS — R69 Illness, unspecified: Secondary | ICD-10-CM | POA: Diagnosis not present

## 2017-05-18 DIAGNOSIS — Z1321 Encounter for screening for nutritional disorder: Secondary | ICD-10-CM | POA: Diagnosis not present

## 2017-05-18 DIAGNOSIS — Z13220 Encounter for screening for lipoid disorders: Secondary | ICD-10-CM | POA: Diagnosis not present

## 2017-05-18 DIAGNOSIS — E785 Hyperlipidemia, unspecified: Secondary | ICD-10-CM | POA: Diagnosis not present

## 2017-05-18 DIAGNOSIS — R109 Unspecified abdominal pain: Secondary | ICD-10-CM

## 2017-05-18 DIAGNOSIS — Z0000 Encounter for general adult medical examination without abnormal findings: Secondary | ICD-10-CM

## 2017-05-18 DIAGNOSIS — Z131 Encounter for screening for diabetes mellitus: Secondary | ICD-10-CM | POA: Diagnosis not present

## 2017-05-18 DIAGNOSIS — K5900 Constipation, unspecified: Secondary | ICD-10-CM | POA: Diagnosis not present

## 2017-05-18 LAB — POCT URINALYSIS DIPSTICK
Appearance: NORMAL
Bilirubin, UA: NEGATIVE
Blood, UA: NEGATIVE
Glucose, UA: NEGATIVE
Ketones, UA: NEGATIVE
Leukocytes, UA: NEGATIVE
Nitrite, UA: NEGATIVE
Odor: NORMAL
Protein, UA: NEGATIVE
Spec Grav, UA: 1.01 (ref 1.010–1.025)
Urobilinogen, UA: 0.2 E.U./dL
pH, UA: 7.5 (ref 5.0–8.0)

## 2017-05-18 NOTE — Progress Notes (Signed)
   Subjective:    Patient ID: Donna [name], female    DOB: 18-Feb-1951, 67 y.o.   MRN: [id number]  HPI Patient has been constipated recently.  She started taking some natural laxative supplement and says it is better.  She is been complaining of some vague epigastric and right upper quadrant abdominal pain now for some time.  History of fatty liver and alcohol abuse.  In July she was drinking and her SGOT was 60 and SGPT was 79.  By late August liver functions were normal.  In July she had ultrasound of her abdomen showing no gallstones but fatty liver.  She had similar complaints in March 2017 and had CT of abdomen and pelvis and was found to have a rib fracture.  At that time gallbladder was normal.  No hepatic lesions and no biliary dilatation no ascites.  She is vague about this pain but apparently it is episodic.  Says it is present today.  Review of Systems see above     Objective:   Physical Exam Abdomen is soft obese and nondistended.  No hepatosplenomegaly.  She is vaguely tender in her epigastric and right upper quadrant area without rebound.       Assessment & Plan:  She perhaps is constipated and will have KUB today to determine this.  If pain does not improve she will have CT of abdomen and pelvis for further evaluation.  I am thinking this may be irritable bowel syndrome.  Denies drinking at the present time.  She is working as a Art gallery manager.

## 2017-05-18 NOTE — Patient Instructions (Signed)
To have KUB flat and upright.  Labs drawn and pending.  These labs are  for her physical exam in February.

## 2017-05-19 LAB — LIPID PANEL
Cholesterol: 261 mg/dL — ABNORMAL HIGH (ref <200)
HDL: 81 mg/dL (ref >50)
LDL Cholesterol (Calc): 165 mg/dL (calc) — ABNORMAL HIGH
Non-HDL Cholesterol (Calc): 180 mg/dL (calc) — ABNORMAL HIGH (ref <130)
Total CHOL/HDL Ratio: 3.2 (calc) (ref <5.0)
Triglycerides: 57 mg/dL (ref <150)

## 2017-05-19 LAB — VITAMIN D 25 HYDROXY (VIT D DEFICIENCY, FRACTURES): Vit D, 25-Hydroxy: 31 ng/mL (ref 30–100)

## 2017-05-19 LAB — HEMOGLOBIN A1C
Hgb A1c MFr Bld: 5.2 % of total Hgb (ref <5.7)
Mean Plasma Glucose: 103 (calc)
eAG (mmol/L): 5.7 (calc)

## 2017-05-19 LAB — COMPLETE METABOLIC PANEL WITH GFR
AG Ratio: 1.6 (calc) (ref 1.0–2.5)
ALT: 15 U/L (ref 6–29)
AST: 18 U/L (ref 10–35)
Albumin: 4.1 g/dL (ref 3.6–5.1)
Alkaline phosphatase (APISO): 62 U/L (ref 33–130)
BUN: 7 mg/dL (ref 7–25)
CO2: 28 mmol/L (ref 20–32)
Calcium: 9.3 mg/dL (ref 8.6–10.4)
Chloride: 101 mmol/L (ref 98–110)
Creat: 0.72 mg/dL (ref 0.50–0.99)
GFR, Est African American: 102 mL/min/{1.73_m2} (ref >=60)
GFR, Est Non African American: 88 mL/min/{1.73_m2} (ref >=60)
Globulin: 2.5 g/dL (calc) (ref 1.9–3.7)
Glucose, Bld: 98 mg/dL (ref 65–99)
Potassium: 4.4 mmol/L (ref 3.5–5.3)
Sodium: 136 mmol/L (ref 135–146)
Total Bilirubin: 0.4 mg/dL (ref 0.2–1.2)
Total Protein: 6.6 g/dL (ref 6.1–8.1)

## 2017-05-19 LAB — CBC WITH DIFFERENTIAL/PLATELET
Basophils Absolute: 19 cells/uL (ref 0–200)
Basophils Relative: 0.5 %
Eosinophils Absolute: 61 cells/uL (ref 15–500)
Eosinophils Relative: 1.6 %
HCT: 36.6 % (ref 35.0–45.0)
Hemoglobin: 12.9 g/dL (ref 11.7–15.5)
Lymphs Abs: 1136 cells/uL (ref 850–3900)
MCH: 31.2 pg (ref 27.0–33.0)
MCHC: 35.2 g/dL (ref 32.0–36.0)
MCV: 88.6 fL (ref 80.0–100.0)
MPV: 9.3 fL (ref 7.5–12.5)
Monocytes Relative: 8.8 %
Neutro Abs: 2250 cells/uL (ref 1500–7800)
Neutrophils Relative %: 59.2 %
Platelets: 296 10*3/uL (ref 140–400)
RBC: 4.13 10*6/uL (ref 3.80–5.10)
RDW: 11.8 % (ref 11.0–15.0)
Total Lymphocyte: 29.9 %
WBC mixed population: 334 cells/uL (ref 200–950)
WBC: 3.8 10*3/uL (ref 3.8–10.8)

## 2017-05-19 LAB — TSH: TSH: 1.3 mIU/L (ref 0.40–4.50)

## 2017-05-19 LAB — HEPATITIS C ANTIBODY
Hepatitis C Ab: NONREACTIVE
SIGNAL TO CUT-OFF: 0.01 (ref <1.00)

## 2017-06-15 ENCOUNTER — Ambulatory Visit: Payer: Medicare HMO | Admitting: Sports Medicine

## 2017-06-15 VITALS — BP 134/82 | Ht 67.0 in | Wt 160.0 lb

## 2017-06-15 DIAGNOSIS — M76821 Posterior tibial tendinitis, right leg: Secondary | ICD-10-CM

## 2017-06-15 DIAGNOSIS — M542 Cervicalgia: Secondary | ICD-10-CM

## 2017-06-15 NOTE — Progress Notes (Signed)
Subjective:    Patient ID: Donna [name], female    DOB: Jul 31, 1950, 67 y.o.   MRN: [id number]  HPI  Donna [name] is a 67 yo F presenting with R ankle pain and L neck pain.   R ankle pain Ongoing for 3 weeks. Feels similar to episode of posterior tibial tendonitis she experienced in 2017. Describes pain as a "pinching" sensation around her Achilles. Typically occurs when doing yoga, but recently has started having pain after walking, especially if she was walking downhill or down stairs. Is still able to do yoga, though typically demonstrates poses on the L now. Also reports a "painful pulling" sensation beginning along the base of her great toe and extending to her medial malleolus. Massage has not been very helpful, and she has not tried anything else to alleviate pain.   L neck pain Began around 03/2017. Dull pain present all day every day, however has intermittent sharp shooting pains as well. Pain located only on L neck, radiating to her L scapula. No pain or symptoms in her L arm. Has issues turning her head to check blind spots when driving. Massaging worsens symptoms. Heat helps somewhat. Has not tried any medications. Had dry needling a few years ago for a similar neck problem which was helpful, however has not tried dry needling for current episode of pain. Stretching has helped more than anything else.   Review of Systems MSK: Denies weakness, numbness, tingling, pain in upper extremities.     Objective:     Well developed, well-nourished. No acute distress.  Right ankle: Full range of motion. No effusion. No soft tissue swelling. Patient is tender to palpation along the distal posterior tibialis tendon and insertion onto the navicular. No pain with resisted great toe flexion. Neurovascularly intact distally.  Cervical spine: Limited cervical rotation especially to the left. She is tender to palpation along the left paraspinal musculature and trapezius. No spasm. No focal  neurological deficits of either upper extremity. Assessment & Plan:  Posterior tibial tendonitis of right leg Current symptoms identical to prior episode of pain, and physical exam findings consistent with diagnosis. As patient responded well to at home exercises before, will resume eccentric posterior tib exercises. Also encouraged patient to resume use of arch strap during yoga. Also given new inserts today (green inserts with scaphoid pads), as old inserts were worn down. F/u PRN.   Neck pain Most likely MSK etiology, as no signs of neurological deficits on exam, TTP on exam, and worsening of pain with movement. Less likely arthritis, though this is included on differential. (Of note, patient brought picture of recent spinal MRI which did not show any signs of degeneration, so will not obtain neck plain films at this time.) Encouraged patient to resume dry needling, and physical therapy referral placed. Will also try isometric cervical spine exercises. Continue stretching and moist heat as well. F/u PRN.   Adin Hector, MD, MPH PGY-3 [facility] Pager [phone]  Patient seen and evaluated with the resident. I agree with the above plan of care. Patient's right ankle pain is identical in nature to what she has experienced previously. Proceed with treatment as outlined above. In regards to her neck pain, this is likely due to some cervical spine degenerative changes. We will start physical therapy and wean to a home exercise program with the therapist's discretion. If symptoms persist or worsen then consider x-rays of the cervical spine. Patient will follow-up for ongoing or recalcitrant issues.

## 2017-06-15 NOTE — Assessment & Plan Note (Signed)
Most likely MSK etiology, as no signs of neurological deficits on exam, TTP on exam, and worsening of pain with movement. Less likely arthritis, though this is included on differential. (Of note, patient brought picture of recent spinal MRI which did not show any signs of degeneration, so will not obtain neck plain films at this time.) Encouraged patient to resume dry needling, and physical therapy referral placed. Will also try isometric cervical spine exercises. Continue stretching and moist heat as well. F/u PRN.

## 2017-06-15 NOTE — Assessment & Plan Note (Signed)
Current symptoms identical to prior episode of pain, and physical exam findings consistent with diagnosis. As patient responded well to at home exercises before, will resume eccentric posterior tib exercises. Also encouraged patient to resume use of arch strap during yoga. Also given new inserts today (green inserts with scaphoid pads), as old inserts were worn down. F/u PRN.

## 2017-06-24 ENCOUNTER — Other Ambulatory Visit: Payer: Medicare HMO | Admitting: Internal Medicine

## 2017-06-25 ENCOUNTER — Encounter: Payer: Self-pay | Admitting: Internal Medicine

## 2017-06-25 ENCOUNTER — Ambulatory Visit (INDEPENDENT_AMBULATORY_CARE_PROVIDER_SITE_OTHER): Payer: Medicare HMO | Admitting: Internal Medicine

## 2017-06-25 VITALS — BP 102/70 | HR 76 | Temp 98.6°F | Ht 66.5 in | Wt 161.0 lb

## 2017-06-25 DIAGNOSIS — Z86010 Personal history of colonic polyps: Secondary | ICD-10-CM | POA: Diagnosis not present

## 2017-06-25 DIAGNOSIS — F1021 Alcohol dependence, in remission: Secondary | ICD-10-CM | POA: Diagnosis not present

## 2017-06-25 DIAGNOSIS — F419 Anxiety disorder, unspecified: Secondary | ICD-10-CM | POA: Diagnosis not present

## 2017-06-25 DIAGNOSIS — B009 Herpesviral infection, unspecified: Secondary | ICD-10-CM | POA: Diagnosis not present

## 2017-06-25 DIAGNOSIS — Z0000 Encounter for general adult medical examination without abnormal findings: Secondary | ICD-10-CM

## 2017-06-25 DIAGNOSIS — Z800 Family history of malignant neoplasm of digestive organs: Secondary | ICD-10-CM | POA: Diagnosis not present

## 2017-06-25 DIAGNOSIS — Z860101 Personal history of adenomatous and serrated colon polyps: Secondary | ICD-10-CM

## 2017-06-25 DIAGNOSIS — K219 Gastro-esophageal reflux disease without esophagitis: Secondary | ICD-10-CM | POA: Diagnosis not present

## 2017-06-25 DIAGNOSIS — R69 Illness, unspecified: Secondary | ICD-10-CM | POA: Diagnosis not present

## 2017-06-25 LAB — POCT URINALYSIS DIPSTICK
Appearance: NORMAL
Bilirubin, UA: NEGATIVE
Glucose, UA: NEGATIVE
Ketones, UA: NEGATIVE
Leukocytes, UA: NEGATIVE
Nitrite, UA: NEGATIVE
Odor: NORMAL
Protein, UA: NEGATIVE
Spec Grav, UA: 1.01 (ref 1.010–1.025)
Urobilinogen, UA: 0.2 E.U./dL
pH, UA: 6 (ref 5.0–8.0)

## 2017-06-28 ENCOUNTER — Other Ambulatory Visit: Payer: Self-pay

## 2017-06-28 ENCOUNTER — Encounter: Payer: Self-pay | Admitting: Physical Therapy

## 2017-06-28 ENCOUNTER — Ambulatory Visit: Payer: Medicare HMO | Attending: Sports Medicine | Admitting: Physical Therapy

## 2017-06-28 DIAGNOSIS — R252 Cramp and spasm: Secondary | ICD-10-CM | POA: Diagnosis not present

## 2017-06-28 DIAGNOSIS — M542 Cervicalgia: Secondary | ICD-10-CM | POA: Diagnosis not present

## 2017-06-28 DIAGNOSIS — M25571 Pain in right ankle and joints of right foot: Secondary | ICD-10-CM | POA: Insufficient documentation

## 2017-06-28 NOTE — Therapy (Signed)
[address] [place], [place], [address] Phone: [phone]   Fax:  [phone]  Physical Therapy Evaluation  Patient Details  Name: Donna [name] MRN: [id number] Date of Birth: Mar 25, 1951 Referring Provider: Micheline Chapman   Encounter Date: 06/28/2017  PT End of Session - 06/28/17 1017    Visit Number  1    Date for PT Re-Evaluation  08/26/17    PT Start Time  0840    PT Stop Time  0926    PT Time Calculation (min)  46 min    Activity Tolerance  Patient tolerated treatment well    Behavior During Therapy  [facility] for tasks assessed/performed       Past Medical History:  Diagnosis Date  . Anxiety   . Arthritis   . Diverticula of colon   . GERD (gastroesophageal reflux disease)   . Hiatal hernia   . Hypertension   . IBS (irritable bowel syndrome)     Past Surgical History:  Procedure Laterality Date  . ANKLE SURGERY     Bilateral   . TUBAL LIGATION      There were no vitals filed for this visit.   Subjective Assessment - 06/28/17 0849    Subjective  Patient reports that she has been on the computer more and having neck pain.  She also reports right ankle pain mostly posterior tib area and the achilles.  She has a history of right ankle ORIF in 1989.  Past x-rays showed DDD.    Limitations  Sitting;Reading;Walking    Patient Stated Goals  have less pain, better motions    Currently in Pain?  Yes    Pain Score  0-No pain    Pain Location  Neck also right ankle    Pain Orientation  Left    Pain Descriptors / Indicators  Aching;Tightness    Pain Type  Acute pain    Pain Onset  More than a month ago    Pain Frequency  Intermittent    Aggravating Factors   sitting, texting, at computer patient reports neck pain,  squatting increases ankle/foot pain    Pain Relieving Factors  nothing has helped the pain except just not moving    Effect of Pain on Daily Activities  just hurts         [facility] PT  Assessment - 06/28/17 0001      Assessment   Medical Diagnosis  neck pain and right ankle pain    Referring Provider  Draper    Onset Date/Surgical Date  05/28/17    Prior Therapy  no      Precautions   Precautions  None      Balance Screen   Has the patient fallen in the past 6 months  No    Has the patient had a decrease in activity level because of a fear of falling?   No    Is the patient reluctant to leave their home because of a fear of falling?   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    Leisure  teaches yoga      Posture/Postural Control   Posture Comments  fwd head, rounded shoulders  ROM / Strength   AROM / PROM / Strength  AROM;Strength      AROM   Overall AROM Comments  Cervical ROM decreased 25% for rotation and 50% limited for side bending with a pinching and pulling, right ankle ROM is WFL's      Strength   Overall Strength Comments  strength of the UE's 4/5      Flexibility   Soft Tissue Assessment /Muscle Length  -- very good flexibility      Palpation   Palpation comment  she is very tender in the cervical parapsinals, she has tightness and tendereness in the C4-6 area             Objective measurements completed on examination: See above findings.      [facility] PT Treatment/Exercise - 06/28/17 0001      Manual Therapy   Manual Therapy  Joint mobilization;Manual Traction    Joint Mobilization  to unlock facet, overpressure on the transverse processes for rotation and side bending bilaterally from C3-6    Manual Traction  occipital realease                  PT Long Term Goals - 06/28/17 1020      PT LONG TERM GOAL #1   Title  understand proper posture and body mechnaics    Time  8    Period  Weeks    Status  New      PT LONG TERM GOAL #2    Title  increase cervical ROM to WNL's    Time  8    Period  Weeks    Status  New      PT LONG TERM GOAL #3   Title  report pain in the neck decreased 50%    Time  8    Period  Weeks    Status  New      PT LONG TERM GOAL #4   Title  report no difficulty with ankle moving from yoga posistions    Time  8    Period  Weeks    Status  New             Plan - 06/28/17 1018    Clinical Impression Statement  Patient reports neck pain for about 1-2 months after increased computer use.  It seems to me that she has a facet issue with limitation and a "pinching" with rotation and side bending.  She has a lot of spasms and is very tender to palpation.  She reports that she does not want to try machine traction, TENs or dry needling.  She has an ankle issue that may be achilles and posterior tibialis in origin.    Clinical Presentation  Stable    Clinical Decision Making  Low    Rehab Potential  Good    PT Frequency  1x / week    PT Duration  8 weeks    PT Treatment/Interventions  Neuromuscular re-education;Therapeutic exercise;Therapeutic activities;Patient/family education;Manual techniques    PT Next Visit Plan  may try the facet unlocking, she reported it felt good today    Consulted and Agree with Plan of Care  Patient       Patient will benefit from skilled therapeutic intervention in order to improve the following deficits and impairments:  Postural dysfunction, Impaired flexibility, Pain, Increased muscle spasms, Increased fascial restricitons, Decreased range of motion  Visit Diagnosis: Cervicalgia - Plan: PT plan of care cert/re-cert  Pain in  right ankle and joints of right foot - Plan: PT plan of care cert/re-cert  Cramp and spasm - Plan: PT plan of care cert/re-cert     Problem List Patient Active Problem List   Diagnosis Date Noted  . Neck pain 06/15/2017  . Hx of adenomatous colonic polyps 08/03/2015  . Posterior tibial tendonitis of right leg 07/01/2015  .  Herpes simplex 06/15/2014  . Spasm of cervical paraspinous muscle 05/12/2014  . GERD (gastroesophageal reflux disease) 10/14/2013  . Anxiety state, unspecified 10/14/2013  . Recovering alcoholic in remission ([place]) 10/14/2013  . Allergic rhinitis 10/14/2013  . Heart murmur 05/25/2012  . Back pain, lumbosacral 01/19/2012  . Hallux rigidus of right foot 04/08/2011    Sumner Boast., PT 06/28/2017, 10:23 AM  [address] [place], [place], [address] Phone: [phone]   Fax:  [phone]  Name: Donna [name] MRN: [id number] Date of Birth: 07-20-1950

## 2017-06-29 ENCOUNTER — Other Ambulatory Visit: Payer: Self-pay | Admitting: Physician Assistant

## 2017-06-29 DIAGNOSIS — C4401 Basal cell carcinoma of skin of lip: Secondary | ICD-10-CM | POA: Diagnosis not present

## 2017-06-29 DIAGNOSIS — L570 Actinic keratosis: Secondary | ICD-10-CM | POA: Diagnosis not present

## 2017-06-29 DIAGNOSIS — D040 Carcinoma in situ of skin of lip: Secondary | ICD-10-CM | POA: Diagnosis not present

## 2017-06-29 DIAGNOSIS — D485 Neoplasm of uncertain behavior of skin: Secondary | ICD-10-CM | POA: Diagnosis not present

## 2017-06-29 DIAGNOSIS — C4491 Basal cell carcinoma of skin, unspecified: Secondary | ICD-10-CM

## 2017-06-29 HISTORY — DX: Basal cell carcinoma of skin, unspecified: C44.91

## 2017-07-01 DIAGNOSIS — M9905 Segmental and somatic dysfunction of pelvic region: Secondary | ICD-10-CM | POA: Diagnosis not present

## 2017-07-01 DIAGNOSIS — M9903 Segmental and somatic dysfunction of lumbar region: Secondary | ICD-10-CM | POA: Diagnosis not present

## 2017-07-01 DIAGNOSIS — M9902 Segmental and somatic dysfunction of thoracic region: Secondary | ICD-10-CM | POA: Diagnosis not present

## 2017-07-01 DIAGNOSIS — M7071 Other bursitis of hip, right hip: Secondary | ICD-10-CM | POA: Diagnosis not present
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
## 2017-07-06 NOTE — Patient Instructions (Signed)
It was a pleasure to see you today.  Continue same medications and return in 1 year or as needed. 

## 2017-07-09 DIAGNOSIS — Z01419 Encounter for gynecological examination (general) (routine) without abnormal findings: Secondary | ICD-10-CM | POA: Diagnosis not present

## 2017-07-13 ENCOUNTER — Encounter: Payer: Self-pay | Admitting: Sports Medicine

## 2017-07-13 ENCOUNTER — Ambulatory Visit: Payer: Medicare HMO | Admitting: Sports Medicine

## 2017-07-13 VITALS — BP 132/59 | Ht 67.0 in | Wt 160.0 lb

## 2017-07-13 DIAGNOSIS — H1033 Unspecified acute conjunctivitis, bilateral: Secondary | ICD-10-CM | POA: Diagnosis not present

## 2017-07-13 DIAGNOSIS — M1812 Unilateral primary osteoarthritis of first carpometacarpal joint, left hand: Secondary | ICD-10-CM | POA: Diagnosis not present

## 2017-07-13 NOTE — Progress Notes (Signed)
   Subjective:    Patient ID: Donna [name], female    DOB: [birth year], 67 y.o.   MRN: [id number]  HPI chief complaint: Left hand pain  Amanie comes in today with concerns about her left hand. She's been having pain intermittently for about 3 years. She localizes most of her pain to the [facility] joint of her thumb but she will occasionally get radiating pain into the web of her hand and her second MCP joint. 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 She also will have pain with grip. She's not noticed any swelling. She denies trauma but  is concerned that she may have torn a ligament. She uses topical creams for pain relief. She denies pain in the right hand. She denies numbness or tingling.    Review of Systems As above    Objective:   Physical Exam  Well-developed, well-nourished. No acute distress  Left hand: There is a mild amount of hypertrophy at the [facility] joint. No effusion. No soft tissue swelling. No atrophy. Mild pain with CMC grind. There is also some hypertrophy of the second MCP joint. Patient is able to make a complete fist. Good ligamentous stability at the thumb. Good grip strength. Negative Tinel's at the carpal tunnel. Good pulses.  MSK ultrasound of the left thumb was performed. There is a mild amount of spurring at the [facility] joint. Otherwise unremarkable      Assessment & Plan:   Left thumb CMC osteoarthritis  Patient's symptoms are tolerable at this point in time. I reassured her that she does not have any sort of significant ligament tear. She'll continue with symptomatic treatment. She understands a cortisone injection could be considered in the future. We could also evaluate further with an x-ray. Follow-up as needed.

## 2017-07-19 ENCOUNTER — Encounter: Payer: Self-pay | Admitting: Sports Medicine

## 2017-07-19 ENCOUNTER — Telehealth: Payer: Self-pay

## 2017-07-19 ENCOUNTER — Ambulatory Visit: Payer: Medicare HMO | Admitting: Sports Medicine

## 2017-07-19 DIAGNOSIS — M76821 Posterior tibial tendinitis, right leg: Secondary | ICD-10-CM

## 2017-07-19 DIAGNOSIS — M79673 Pain in unspecified foot: Secondary | ICD-10-CM

## 2017-07-19 DIAGNOSIS — R1084 Generalized abdominal pain: Secondary | ICD-10-CM

## 2017-07-19 NOTE — Progress Notes (Signed)
   Subjective:    Patient ID: Donna [name], female    DOB: 02-29-1951, 67 y.o.   MRN: [id number]  HPI chief complaint: Right heel pain  Patient comes in today complaining of sudden onset right heel pain that she localizes to the posterior heel. It occurred while walking on a treadmill. Pain has now resolved but she is concerned about injury to her Achilles tendon. She has been wearing her green sports insoles and scaphoid pads. She did put a small heel lift in her right shoe which has been helpful. She's not noticed any swelling. Pain will radiate around the ankle. No numbness or tingling.    Review of Systems As above    Objective:   Physical Exam  Well-developed, well-nourished. No acute distress. Sitting comfortably in exam room.  Right ankle: Full range of motion. No effusion. No soft tissue swelling. There is no tenderness to palpation along the Achilles tendon but she is tender to palpation in the retrocalcaneal area. Good strength. Neurovascularly intact distally. Walking without a limp.  MSK ultrasound of the right Achilles tendon shows a normal tendon. There is some nonspecific edema in the retrocalcaneal area but no appreciable retrocalcaneal bursitis.      Assessment & Plan:   Right heel pain likely secondary to Kager's fat pad edema  I discussed a short trial of anti-inflammatories but the patient does not feel like her symptoms warrant that at this time. I placed a 3/16 inch heel lift on each of her green inserts. I reassured her that her Achilles tendon is unremarkable. She may resume exercise as tolerated. Follow-up as needed.

## 2017-07-19 NOTE — Telephone Encounter (Signed)
Patient is calling to say that she does want to follow up on the abdominal pain.  She said they went out to eat yesterday and she had belly pain all day yesterday.  She can't pin it down to one certain type of food.  She just feels that it is noted to be due to foods.  She tries not to eat gluten.  She just tries not to get too full, because that's when she notices it the most.    You had told her if she wanted to follow up on this, you would send her to a GI doctor.  She states that she is not constipated.  R sided pain between the ribs and the pelvis.  No fever.    She has seen [facility] for colonoscopy in the past.    Phone:  [phone].

## 2017-07-19 NOTE — Telephone Encounter (Signed)
Please send referral to [facility]

## 2017-07-20 NOTE — Telephone Encounter (Signed)
GI referral was laced through Epic.

## 2017-07-22 DIAGNOSIS — C4401 Basal cell carcinoma of skin of lip: Secondary | ICD-10-CM | POA: Diagnosis not present

## 2017-07-22 DIAGNOSIS — L570 Actinic keratosis: Secondary | ICD-10-CM | POA: Diagnosis not present

## 2017-08-05 ENCOUNTER — Encounter: Payer: Self-pay | Admitting: Physician Assistant

## 2017-08-05 ENCOUNTER — Ambulatory Visit: Payer: Medicare HMO | Admitting: Physician Assistant

## 2017-08-05 ENCOUNTER — Other Ambulatory Visit (INDEPENDENT_AMBULATORY_CARE_PROVIDER_SITE_OTHER): Payer: Medicare HMO

## 2017-08-05 VITALS — BP 122/74 | HR 60 | Ht 67.0 in | Wt 162.6 lb

## 2017-08-05 DIAGNOSIS — Z86010 Personal history of colonic polyps: Secondary | ICD-10-CM

## 2017-08-05 DIAGNOSIS — Z8379 Family history of other diseases of the digestive system: Secondary | ICD-10-CM | POA: Diagnosis not present

## 2017-08-05 DIAGNOSIS — R109 Unspecified abdominal pain: Secondary | ICD-10-CM

## 2017-08-05 DIAGNOSIS — Z1211 Encounter for screening for malignant neoplasm of colon: Secondary | ICD-10-CM

## 2017-08-05 LAB — BASIC METABOLIC PANEL
BUN: 8 mg/dL (ref 6–23)
CO2: 28 mEq/L (ref 19–32)
Calcium: 9.2 mg/dL (ref 8.4–10.5)
Chloride: 100 mEq/L (ref 96–112)
Creatinine, Ser: 0.65 mg/dL (ref 0.40–1.20)
GFR: 97.04 mL/min (ref >60.00)
Glucose, Bld: 110 mg/dL — ABNORMAL HIGH (ref 70–99)
Potassium: 4.3 mEq/L (ref 3.5–5.1)
Sodium: 135 mEq/L (ref 135–145)

## 2017-08-05 NOTE — Progress Notes (Signed)
Subjective:    Patient ID: Donna [name], female    DOB: 1950-05-25, 67 y.o.   MRN: [id number]  HPI Donna [name] is a pleasant 67 year old white female, known to Donna [name] who was last seen in our office by myself in July 2018.  She has history of GERD, adenomatous colon polyps and history of EtOH abuse which is currently inactive. She comes in today with complaints of right-sided abdominal pain.  He has had some discomfort in her right abdomen since she was here for her last visit.  She says this is intermittent and not severe.  He does feel that eating exacerbates her symptoms at times she describes it as being an achy type of pain and occasionally "jabbing".  She says she frequently feels full and bloated.  Her appetite has been great, weight has been stable no complaints of nausea or vomiting.  She is been using MiraLAX on a daily basis and is having normal bowel movements.  She is not seen any melena or hematochezia. She did see Donna [name] in January, had labs done showing normal LFTs and CBC, hep C antibody was negative.  KUB was done at that time which did show a lot of stool in the colon. Abdominal ultrasound done July 2018 showed diffuse hepatic steatosis no gallstones or wall thickening. Prior EGD in 2011 showed distal esophagitis no varices and last colonoscopy was done in November 2016 showing mild diverticulosis of the transverse colon and one 6 mm colon polyp which was removed and found to be a tubular adenoma.  She is indicated for 5-year interval follow-up. Patient is concerned about colon cancer.  She has a strong family history of colon cancer in her paternal grandmother, her father and a paternal aunt.  Review of Systems Pertinent positive and negative review of systems were noted in the above HPI section.  All other review of systems was otherwise negative.  Outpatient Encounter Medications as of 08/05/2017  Medication Sig  . b complex vitamins tablet Take 1 tablet by mouth daily.      . calcium gluconate 500 MG tablet Take 500 mg by mouth daily.    Marland Kitchen estradiol (ESTRACE) 1 MG tablet Insert 1cc in vagina 3 times a week  . Estriol 10 % CREA Insert 1 ml in vagina 3 times a week  . Flaxseed, Linseed, (FLAX SEED OIL) 1000 MG CAPS Take 1 capsule by mouth daily.    . Magnesium 500 MG TABS Take 1 tablet by mouth daily.    . Multiple Vitamin (MULTIVITAMIN) capsule Take 1 capsule by mouth daily.    . NONFORMULARY OR COMPOUNDED ITEM Progesterone 80m cap  . NONFORMULARY OR COMPOUNDED ITEM Estradiol/testosterone 0.25-0.25mg/ml cream  . Omega-3 Fatty Acids (OMEGA-3 FISH OIL) 1200 MG CAPS Take 1 capsule by mouth daily.    . progesterone (PROMETRIUM) 100 MG capsule TAKE ONE 50MG CAPSULE AT BED TIME  . propranolol (INDERAL) 10 MG tablet TAKE 1 TABLET BY MOUTH 3 TIMES A DAY  . valACYclovir (VALTREX) 500 MG tablet TAKE 1 TABLET(500 MG) BY MOUTH TWICE DAILY   No facility-administered encounter medications on file as of 08/05/2017.    Allergies  Allergen Reactions  . Ciprofloxacin Other (See Comments)    Neuropathy   . Doxycycline Nausea Only  . Gluten Meal     GI upset   . Penicillins Nausea And Vomiting    "breaking out"  . Quinolones Other (See Comments)    REACTION: sick / neuropathy    Patient  Active Problem List   Diagnosis Date Noted  . Neck pain 06/15/2017  . Hx of adenomatous colonic polyps 08/03/2015  . Posterior tibial tendonitis of right leg 07/01/2015  . Herpes simplex 06/15/2014  . Spasm of cervical paraspinous muscle 05/12/2014  . GERD (gastroesophageal reflux disease) 10/14/2013  . Anxiety state, unspecified 10/14/2013  . Recovering alcoholic in remission ([place]) 10/14/2013  . Allergic rhinitis 10/14/2013  . Heart murmur 05/25/2012  . Back pain, lumbosacral 01/19/2012  . Hallux rigidus of right foot 04/08/2011   Social History   Socioeconomic History  . Marital status: Married    Spouse name: Not on file  . Number of children: 0  . Years of education:  Not on file  . Highest education level: Not on file  Occupational History  . Occupation: [employer]  Social Needs  . Financial resource strain: Not on file  . Food insecurity:    Worry: Not on file    Inability: Not on file  . Transportation needs:    Medical: Not on file    Non-medical: Not on file  Tobacco Use  . Smoking status: Former Smoker    Years: 30.00    Types: Cigarettes  . Smokeless tobacco: Never Used  Substance and Sexual Activity  . Alcohol use: No    Alcohol/week: 0.0 oz  . Drug use: No  . Sexual activity: Yes  Lifestyle  . Physical activity:    Days per week: Not on file    Minutes per session: Not on file  . Stress: Not on file  Relationships  . Social connections:    Talks on phone: Not on file    Gets together: Not on file    Attends religious service: Not on file    Active member of club or organization: Not on file    Attends meetings of clubs or organizations: Not on file    Relationship status: Not on file  . Intimate partner violence:    Fear of current or ex partner: Not on file    Emotionally abused: Not on file    Physically abused: Not on file    Forced sexual activity: Not on file  Other Topics Concern  . Not on file  Social History Narrative   2 caffeine drinks daily          Donna [name]'s family history includes Colon cancer in her paternal grandmother; Colon cancer (age of onset: 67) in her father; Colon polyps in her paternal aunt; Diabetes in her mother; Heart attack in her father; Heart disease in her father.      Objective:    Vitals:   08/05/17 0827  BP: 122/74  Pulse: 60    Physical Exam; well-developed female in no acute distress, pleasant blood pressure 122/74 pulse 60, height 5 foot 7, weight 162, BMI 25.4.  HEENT ;nontraumatic normocephalic EOMI PERRLA sclera anicteric, Cardiovascular; regular rate and rhythm with S1-S2 no murmur rub or gallop, Pulmonary; clear bilaterally, Abdomen; soft, no palpable mass or  hepatosplenomegaly bowel sounds are present, no fluid wave, she does have tenderness in the right mid quadrant no guarding or rebound Rectal; exam not done, Extremities ;no clubbing cyanosis or edema skin warm and dry, Neuro psych ;mood and affect appropriate       Assessment & [name]:   #38 67 year old white female with several month history of intermittent right mid abdominal pain, generally achy in nature.  Patient has some associated fullness and bloating.  Mild chronic constipation being  managed with MiraLAX. Etiology of her pain is not clear.  Upper abdominal ultrasound done July 2018 showed hepatic steatosis otherwise negative. #2 history of adenomatous colon polyps last colonoscopy November 2016, due for follow-up 2021 #3 family history of colon cancer in patient's father, paternal grandmother and a paternal aunt #4 diverticulosis #5.  GERD #6.  Previous history of EtOH abuse-patient has now been abstinent for several months #7 hepatic steatosis but no evidence of cirrhosis by ultrasound  [name]; Patient will be scheduled for CT of the abdomen and pelvis with contrast. Check be met today If CT scan is unrevealing we will proceed with colonoscopy with Donna [name].    Genia Harold PA-C 08/05/2017   Cc: Elby Showers, MD

## 2017-08-05 NOTE — Patient Instructions (Signed)
Please go to the basement level to have your labs drawn.   You have been scheduled for a CT scan of the abdomen and pelvis at [facility] ([address]---this is in the same building as Press photographer).   You are scheduled on Monday 08-09-2017 at 9:00 am. You should arrive at 8:45 am to your appointment time for registration. Please follow the written instructions below on the day of your exam:  WARNING: IF YOU ARE ALLERGIC TO IODINE/X-RAY DYE, PLEASE NOTIFY RADIOLOGY IMMEDIATELY AT [phone]! YOU WILL BE GIVEN A 13 HOUR PREMEDICATION PREP.  1) Do not eat  anything after 5:00 am  (4 hours prior to your test) 2) You have been given 2 bottles of oral contrast to drink. The solution may taste better if refrigerated, but do NOT add ice or any other liquid to this solution. Shake well before drinking.    Drink 1 bottle of contrast @ 7:00 am(2 hours prior to your exam)  Drink 1 bottle of contrast @ 8:00 am (1 hour prior to your exam)  You may take any medications as prescribed with a small amount of water except for the following: Metformin, Glucophage, Glucovance, Avandamet, Riomet, Fortamet, Actoplus Met, Janumet, Glumetza or Metaglip. The above medications must be held the day of the exam AND 48 hours after the exam.  The purpose of you drinking the oral contrast is to aid in the visualization of your intestinal tract. The contrast solution may cause some diarrhea. Before your exam is started, you will be given a small amount of fluid to drink. Depending on your individual set of symptoms, you may also receive an intravenous injection of x-ray contrast/dye. Plan on being at [facility] for 30 minutes or long, depending on the type of exam you are having performed.  If you have any questions regarding your exam or if you need to reschedule, you may call the CT department at [phone] between the hours of 8:00 am and 5:00 pm,  Monday-Friday.  ________________________________________________________________________

## 2017-08-06 NOTE — Progress Notes (Signed)
Reviewed and agree with management plan.  Deeanna Beightol T. Abubakar Crispo, MD FACG 

## 2017-08-09 ENCOUNTER — Ambulatory Visit (INDEPENDENT_AMBULATORY_CARE_PROVIDER_SITE_OTHER)
Admission: RE | Admit: 2017-08-09 | Discharge: 2017-08-09 | Disposition: A | Discharge status: Home / Self Care | Payer: Medicare HMO | Source: Ambulatory Visit | Attending: Physician Assistant | Admitting: Physician Assistant

## 2017-08-09 DIAGNOSIS — Z8379 Family history of other diseases of the digestive system: Secondary | ICD-10-CM

## 2017-08-09 DIAGNOSIS — Z1211 Encounter for screening for malignant neoplasm of colon: Secondary | ICD-10-CM | POA: Diagnosis not present

## 2017-08-09 DIAGNOSIS — Z86010 Personal history of colonic polyps: Secondary | ICD-10-CM

## 2017-08-09 DIAGNOSIS — R1031 Right lower quadrant pain: Secondary | ICD-10-CM | POA: Diagnosis not present

## 2017-08-09 MED ORDER — IOPAMIDOL (ISOVUE-300) INJECTION 61%
100.0000 mL | Freq: Once | INTRAVENOUS | Status: AC | PRN
  Administered 2017-08-09: 100 mL via INTRAVENOUS

## 2017-08-12 ENCOUNTER — Telehealth: Payer: Self-pay | Admitting: Physician Assistant

## 2017-08-12 NOTE — Telephone Encounter (Signed)
Scheduled for colon-see results note

## 2017-08-17 ENCOUNTER — Other Ambulatory Visit: Payer: Self-pay | Admitting: Internal Medicine

## 2017-08-24 ENCOUNTER — Telehealth: Payer: Self-pay | Admitting: Gastroenterology

## 2017-08-24 DIAGNOSIS — H10413 Chronic giant papillary conjunctivitis, bilateral: Secondary | ICD-10-CM | POA: Diagnosis not present

## 2017-08-24 NOTE — Telephone Encounter (Signed)
Patient notified that MK7 is fine to continue it is Vitamin K and Vit D.  She is notified that Suprep is Gluten Free

## 2017-09-03 ENCOUNTER — Telehealth: Payer: Self-pay | Admitting: Internal Medicine

## 2017-09-03 NOTE — Telephone Encounter (Signed)
This is commonly seen on CT scan. She can go on a statin drug if she would like. No need for OV.

## 2017-09-03 NOTE — Telephone Encounter (Signed)
Pt was notified of Dr. Verlene Mayer  instructions, pt verbalized understanding.  She said she is not interested on getting on a statin at this time.

## 2017-09-03 NOTE — Telephone Encounter (Signed)
Donna [name] Self [phone]  Anely called to ask if she needed to get an appointment to come in and discuss CT Results. She is concerned on what she should do for Aortic Atherosclerosis.

## 2017-10-08 ENCOUNTER — Encounter: Payer: Self-pay | Admitting: Internal Medicine

## 2017-10-08 DIAGNOSIS — M9902 Segmental and somatic dysfunction of thoracic region: Secondary | ICD-10-CM | POA: Diagnosis not present

## 2017-10-08 DIAGNOSIS — M9904 Segmental and somatic dysfunction of sacral region: Secondary | ICD-10-CM | POA: Diagnosis not present

## 2017-10-08 DIAGNOSIS — M5136 Other intervertebral disc degeneration, lumbar region: Secondary | ICD-10-CM | POA: Diagnosis not present

## 2017-10-08 DIAGNOSIS — M5030 Other cervical disc degeneration, unspecified cervical region: Secondary | ICD-10-CM | POA: Diagnosis not present

## 2017-10-08 DIAGNOSIS — Z1231 Encounter for screening mammogram for malignant neoplasm of breast: Secondary | ICD-10-CM | POA: Diagnosis not present

## 2017-10-08 DIAGNOSIS — M9903 Segmental and somatic dysfunction of lumbar region: Secondary | ICD-10-CM | POA: Diagnosis not present

## 2017-10-08 DIAGNOSIS — M9901 Segmental and somatic dysfunction of cervical region: Secondary | ICD-10-CM | POA: Diagnosis not present

## 2017-10-11 DIAGNOSIS — M5136 Other intervertebral disc degeneration, lumbar region: Secondary | ICD-10-CM | POA: Diagnosis not present

## 2017-10-11 DIAGNOSIS — M5030 Other cervical disc degeneration, unspecified cervical region: Secondary | ICD-10-CM | POA: Diagnosis not present

## 2017-10-11 DIAGNOSIS — M9903 Segmental and somatic dysfunction of lumbar region: Secondary | ICD-10-CM | POA: Diagnosis not present

## 2017-10-11 DIAGNOSIS — M9901 Segmental and somatic dysfunction of cervical region: Secondary | ICD-10-CM | POA: Diagnosis not present

## 2017-10-11 DIAGNOSIS — M9902 Segmental and somatic dysfunction of thoracic region: Secondary | ICD-10-CM | POA: Diagnosis not present

## 2017-10-11 DIAGNOSIS — H10413 Chronic giant papillary conjunctivitis, bilateral: Secondary | ICD-10-CM | POA: Diagnosis not present

## 2017-10-11 DIAGNOSIS — M9904 Segmental and somatic dysfunction of sacral region: Secondary | ICD-10-CM | POA: Diagnosis not present

## 2017-10-12 ENCOUNTER — Ambulatory Visit (AMBULATORY_SURGERY_CENTER): Payer: Self-pay

## 2017-10-12 ENCOUNTER — Other Ambulatory Visit: Payer: Self-pay

## 2017-10-12 VITALS — Ht 67.0 in | Wt 161.2 lb

## 2017-10-12 DIAGNOSIS — Z86010 Personal history of colonic polyps: Secondary | ICD-10-CM

## 2017-10-12 MED ORDER — NA SULFATE-K SULFATE-MG SULF 17.5-3.13-1.6 GM/177ML PO SOLN: 1.0000 | Freq: Once | ORAL | 0 refills | Status: AC

## 2017-10-12 NOTE — Progress Notes (Signed)
Denies allergies to eggs or soy products. Denies complication of anesthesia or sedation. Denies use of weight loss medication. Denies use of O2.   Emmi instructions declined.  

## 2017-10-15 ENCOUNTER — Other Ambulatory Visit: Payer: Self-pay

## 2017-10-15 ENCOUNTER — Ambulatory Visit (AMBULATORY_SURGERY_CENTER): Payer: Medicare HMO | Admitting: Gastroenterology

## 2017-10-15 ENCOUNTER — Encounter: Payer: Self-pay | Admitting: Gastroenterology

## 2017-10-15 VITALS — BP 132/74 | HR 52 | Temp 98.2°F | Resp 14 | Ht 67.0 in | Wt 161.0 lb

## 2017-10-15 DIAGNOSIS — K449 Diaphragmatic hernia without obstruction or gangrene: Secondary | ICD-10-CM | POA: Diagnosis not present

## 2017-10-15 DIAGNOSIS — Z800 Family history of malignant neoplasm of digestive organs: Secondary | ICD-10-CM

## 2017-10-15 DIAGNOSIS — D122 Benign neoplasm of ascending colon: Secondary | ICD-10-CM | POA: Diagnosis not present

## 2017-10-15 DIAGNOSIS — I10 Essential (primary) hypertension: Secondary | ICD-10-CM | POA: Diagnosis not present

## 2017-10-15 DIAGNOSIS — Z86010 Personal history of colonic polyps: Secondary | ICD-10-CM

## 2017-10-15 MED ORDER — SODIUM CHLORIDE 0.9 % IV SOLN: 500.0000 mL | Freq: Once | INTRAVENOUS | Status: DC

## 2017-10-15 NOTE — Progress Notes (Signed)
Report to PACU, RN, vss, BBS= Clear.  

## 2017-10-15 NOTE — Op Note (Signed)
[place] Patient Name: Kent Braunschweig Procedure Date: 10/15/2017 10:21 AM MRN: [id number] Endoscopist: Ladene Artist , MD Age: 67 Referring MD:  Date of Birth: 1950-07-18 Gender: Female Account #: [id number] Procedure:                Colonoscopy Indications:              Surveillance: Personal history of adenomatous                            polyps on last colonoscopy 3 years ago. Strong                            family history of colon cancer. Medicines:                Monitored Anesthesia Care Procedure:                Pre-Anesthesia Assessment:                           - Prior to the procedure, a History and Physical                            was performed, and patient medications and                            allergies were reviewed. The patient's tolerance of                            previous anesthesia was also reviewed. The risks                            and benefits of the procedure and the sedation                            options and risks were discussed with the patient.                            All questions were answered, and informed consent                            was obtained. Prior Anticoagulants: The patient has                            taken no previous anticoagulant or antiplatelet                            agents. ASA Grade Assessment: II - A patient with                            mild systemic disease. After reviewing the risks                            and benefits, the patient was deemed in  satisfactory condition to undergo the procedure.                           After obtaining informed consent, the colonoscope                            was passed under direct vision. Throughout the                            procedure, the patient's blood pressure, pulse, and                            oxygen saturations were monitored continuously. The                            Model PCF-[id number])  scope was introduced                            through the anus and advanced to the the cecum,                            identified by appendiceal orifice and ileocecal                            valve. The ileocecal valve, appendiceal orifice,                            and rectum were photographed. The quality of the                            bowel preparation was good. The colonoscopy was                            performed without difficulty. The patient tolerated                            the procedure well. Scope In: 10:32:43 AM Scope Out: 10:45:34 AM Scope Withdrawal Time: 0 hours 9 minutes 35 seconds  Total Procedure Duration: 0 hours 12 minutes 51 seconds  Findings:                 The perianal and digital rectal examinations were                            normal.                           A 7 mm polyp was found in the ascending colon. The                            polyp was sessile. The polyp was removed with a                            cold snare. Resection and retrieval were complete.  A few medium-mouthed diverticula were found in the                            sigmoid colon, descending colon and transverse                            colon. There was no evidence of diverticular                            bleeding.                           The exam was otherwise without abnormality on                            direct and retroflexion views. Complications:            No immediate complications. Estimated blood loss:                            None. Estimated Blood Loss:     Estimated blood loss: none. Impression:               - One 7 mm polyp in the ascending colon, removed                            with a cold snare. Resected and retrieved.                           - Mild diverticulosis in the sigmoid colon, in the                            descending colon and in the transverse colon. There                            was no evidence of  diverticular bleeding.                           - The examination was otherwise normal on direct                            and retroflexion views. Recommendation:           - Repeat colonoscopy in 3 - 5 years for                            surveillance pending pathology review.                           - Patient has a contact number available for                            emergencies. The signs and symptoms of potential                            delayed complications were  discussed with the                            patient. Return to normal activities tomorrow.                            Written discharge instructions were provided to the                            patient.                           - High fiber diet.                           - Continue present medications.                           - Await pathology results. Ladene Artist, MD 10/15/2017 10:49:17 AM This report has been signed electronically.

## 2017-10-15 NOTE — Patient Instructions (Signed)
Read handouts provided on polyps, diverticulosis, high fiber diet.    YOU HAD AN ENDOSCOPIC PROCEDURE TODAY AT [facility]:   Refer to the procedure report that was given to you for any specific questions about what was found during the examination.  If the procedure report does not answer your questions, please call your gastroenterologist to clarify.  If you requested that your care partner not be given the details of your procedure findings, then the procedure report has been included in a sealed envelope for you to review at your convenience later.  YOU SHOULD EXPECT: Some feelings of bloating in the abdomen. Passage of more gas than usual.  Walking can help get rid of the air that was put into your GI tract during the procedure and reduce the bloating. If you had a lower endoscopy (such as a colonoscopy or flexible sigmoidoscopy) you may notice spotting of blood in your stool or on the toilet paper. If you underwent a bowel prep for your procedure, you may not have a normal bowel movement for a few days.  Please Note:  You might notice some irritation and congestion in your nose or some drainage.  This is from the oxygen used during your procedure.  There is no need for concern and it should clear up in a day or so.  SYMPTOMS TO REPORT IMMEDIATELY:   Following lower endoscopy (colonoscopy or flexible sigmoidoscopy):  Excessive amounts of blood in the stool  Significant tenderness or worsening of abdominal pains  Swelling of the abdomen that is new, acute  Fever of 100F or higher    For urgent or emergent issues, a gastroenterologist can be reached at any hour by calling [phone].   DIET:  We do recommend a small meal at first, but then you may proceed to your regular diet.  Drink plenty of fluids but you should avoid alcoholic beverages for 24 hours.  ACTIVITY:  You should plan to take it easy for the rest of today and you should NOT DRIVE or use heavy machinery  until tomorrow (because of the sedation medicines used during the test).    FOLLOW UP: Our staff will call the number listed on your records the next business day following your procedure to check on you and address any questions or concerns that you may have regarding the information given to you following your procedure. If we do not reach you, we will leave a message.  However, if you are feeling well and you are not experiencing any problems, there is no need to return our call.  We will assume that you have returned to your regular daily activities without incident.  If any biopsies were taken you will be contacted by phone or by letter within the next 1-3 weeks.  Please call us at [phone] if you have not heard about the biopsies in 3 weeks.    SIGNATURES/CONFIDENTIALITY: You and/or your care partner have signed paperwork which will be entered into your electronic medical record.  These signatures attest to the fact that that the information above on your After Visit Summary has been reviewed and is understood.  Full responsibility of the confidentiality of this discharge information lies with you and/or your care-partner.

## 2017-10-15 NOTE — Progress Notes (Signed)
Called to room to assist during endoscopic procedure.  Patient ID and intended procedure confirmed with present staff. Received instructions for my participation in the procedure from the performing physician.  

## 2017-10-15 NOTE — Progress Notes (Signed)
Pt's states no medical or surgical changes since previsit or office visit. 

## 2017-10-18 ENCOUNTER — Telehealth: Payer: Self-pay

## 2017-10-18 NOTE — Telephone Encounter (Signed)
  Follow up Call-  Call back number 10/15/2017 03/25/2015  Post procedure Call Back phone  # [phone]  Permission to leave phone message Yes Yes  Some recent data might be hidden     Voicemail stated "the mailbox is full and can not accept messages" Will try again this afternoon. Sigourney Portillo/Call-back [facility]

## 2017-10-18 NOTE — Telephone Encounter (Signed)
  Follow up Call-  Call back number 10/15/2017 03/25/2015  Post procedure Call Back phone  # [phone]  Permission to leave phone message Yes Yes  Some recent data might be hidden     Patient questions:  Do you have a fever, pain , or abdominal swelling? No. Pain Score  0 *  Have you tolerated food without any problems? Yes.    Have you been able to return to your normal activities? Yes.    Do you have any questions about your discharge instructions: Diet   No. Medications  No. Follow up visit  No.  Do you have questions or concerns about your Care? No.  Actions: * If pain score is 4 or above: No action needed, pain <4.

## 2017-10-22 ENCOUNTER — Telehealth: Payer: Self-pay | Admitting: Gastroenterology

## 2017-10-22 DIAGNOSIS — M9902 Segmental and somatic dysfunction of thoracic region: Secondary | ICD-10-CM | POA: Diagnosis not present

## 2017-10-22 DIAGNOSIS — L570 Actinic keratosis: Secondary | ICD-10-CM | POA: Diagnosis not present

## 2017-10-22 DIAGNOSIS — M9903 Segmental and somatic dysfunction of lumbar region: Secondary | ICD-10-CM | POA: Diagnosis not present

## 2017-10-22 DIAGNOSIS — M5136 Other intervertebral disc degeneration, lumbar region: Secondary | ICD-10-CM | POA: Diagnosis not present

## 2017-10-22 DIAGNOSIS — D229 Melanocytic nevi, unspecified: Secondary | ICD-10-CM | POA: Diagnosis not present

## 2017-10-22 DIAGNOSIS — M5030 Other cervical disc degeneration, unspecified cervical region: Secondary | ICD-10-CM | POA: Diagnosis not present

## 2017-10-22 DIAGNOSIS — M9901 Segmental and somatic dysfunction of cervical region: Secondary | ICD-10-CM | POA: Diagnosis not present

## 2017-10-22 DIAGNOSIS — M9904 Segmental and somatic dysfunction of sacral region: Secondary | ICD-10-CM | POA: Diagnosis not present

## 2017-10-22 NOTE — Telephone Encounter (Signed)
Patient calling for path results of procedure on 6.7.19.

## 2017-10-22 NOTE — Telephone Encounter (Signed)
I left a detailed message that we will mail her a letter when Dr. Fuller Plan returns and has an opportunity to review.

## 2017-10-25 DIAGNOSIS — M9904 Segmental and somatic dysfunction of sacral region: Secondary | ICD-10-CM | POA: Diagnosis not present

## 2017-10-25 DIAGNOSIS — M9903 Segmental and somatic dysfunction of lumbar region: Secondary | ICD-10-CM | POA: Diagnosis not present

## 2017-10-25 DIAGNOSIS — M5030 Other cervical disc degeneration, unspecified cervical region: Secondary | ICD-10-CM | POA: Diagnosis not present

## 2017-10-25 DIAGNOSIS — M9901 Segmental and somatic dysfunction of cervical region: Secondary | ICD-10-CM | POA: Diagnosis not present

## 2017-10-25 DIAGNOSIS — M5136 Other intervertebral disc degeneration, lumbar region: Secondary | ICD-10-CM | POA: Diagnosis not present

## 2017-10-25 DIAGNOSIS — M9902 Segmental and somatic dysfunction of thoracic region: Secondary | ICD-10-CM | POA: Diagnosis not present

## 2017-10-29 ENCOUNTER — Telehealth: Payer: Self-pay | Admitting: Internal Medicine

## 2017-10-29 NOTE — Telephone Encounter (Signed)
Received fax from [facility] for ESTRIOL/TESTOSTERONE HRT (1ML) 0.25-0.25mg /ml Cream  Directions:  Insert 21ml in Vagina 3 times each week  Per Pharmacy:  This can be e-scribed or when fax is received, Dr. Renold Genta must sign the fax to approve the Rx as this is a controlled substance and they compound it.    [facility] Pharmacy:  [phone] Fax:  [phone]

## 2017-11-01 DIAGNOSIS — H1033 Unspecified acute conjunctivitis, bilateral: Secondary | ICD-10-CM | POA: Diagnosis not present

## 2017-11-03 ENCOUNTER — Encounter: Payer: Self-pay | Admitting: Gastroenterology

## 2017-11-15 DIAGNOSIS — M5136 Other intervertebral disc degeneration, lumbar region: Secondary | ICD-10-CM | POA: Diagnosis not present

## 2017-11-15 DIAGNOSIS — M9903 Segmental and somatic dysfunction of lumbar region: Secondary | ICD-10-CM | POA: Diagnosis not present

## 2017-11-15 DIAGNOSIS — M9904 Segmental and somatic dysfunction of sacral region: Secondary | ICD-10-CM | POA: Diagnosis not present

## 2017-11-15 DIAGNOSIS — M9902 Segmental and somatic dysfunction of thoracic region: Secondary | ICD-10-CM | POA: Diagnosis not present

## 2017-11-15 DIAGNOSIS — M5030 Other cervical disc degeneration, unspecified cervical region: Secondary | ICD-10-CM | POA: Diagnosis not present

## 2017-11-15 DIAGNOSIS — M9901 Segmental and somatic dysfunction of cervical region: Secondary | ICD-10-CM | POA: Diagnosis not present

## 2017-11-16 DIAGNOSIS — M5030 Other cervical disc degeneration, unspecified cervical region: Secondary | ICD-10-CM | POA: Diagnosis not present

## 2017-11-16 DIAGNOSIS — M9902 Segmental and somatic dysfunction of thoracic region: Secondary | ICD-10-CM | POA: Diagnosis not present

## 2017-11-16 DIAGNOSIS — M9904 Segmental and somatic dysfunction of sacral region: Secondary | ICD-10-CM | POA: Diagnosis not present

## 2017-11-16 DIAGNOSIS — M9903 Segmental and somatic dysfunction of lumbar region: Secondary | ICD-10-CM | POA: Diagnosis not present

## 2017-11-16 DIAGNOSIS — M9901 Segmental and somatic dysfunction of cervical region: Secondary | ICD-10-CM | POA: Diagnosis not present

## 2017-11-16 DIAGNOSIS — M5136 Other intervertebral disc degeneration, lumbar region: Secondary | ICD-10-CM | POA: Diagnosis not present

## 2017-11-26 ENCOUNTER — Telehealth: Payer: Self-pay

## 2017-11-26 DIAGNOSIS — I10 Essential (primary) hypertension: Secondary | ICD-10-CM

## 2017-11-26 NOTE — Telephone Encounter (Signed)
Patient called sates the name of the medication is "hydrochlorothiazide 10mg , she said it was the lowest dose".

## 2017-11-26 NOTE — Telephone Encounter (Signed)
Called [facility] in [place], [place] [address] Phone #:  [phone]  Spoke with Gerald Stabs, Pharmacist and provided Rx for HCTZ 12.5 mg.  Take 1 daily.  #30, 0 refills.

## 2017-11-26 NOTE — Telephone Encounter (Signed)
It would help for them to contact pharmacy. I see in old paper chart that HCTZ was prescribed by Urgent care in [place] years ago for elevated BP.

## 2017-11-26 NOTE — Telephone Encounter (Signed)
Patient's husband called states patient is currently in [place] for a seminar she's been there since 11/17/17 she will be there until 12/04/17. She took her BP cuff with her and her BP has been running 150/70 and 113/103 for the last 3 days. She also has a sever headache. She thinks this is due to the "high heat and high elevation". She said you have prescribed a liquid medication that has helped her bring her BP down. They can not remember the name of the medication.   She would like to know if you can call this in to [facility]

## 2017-11-26 NOTE — Telephone Encounter (Signed)
This would be HCTZ  12.5 mg. Please call in 30 tabs one po daily no refill

## 2017-11-27 DIAGNOSIS — I10 Essential (primary) hypertension: Secondary | ICD-10-CM | POA: Diagnosis not present

## 2017-12-15 ENCOUNTER — Other Ambulatory Visit: Payer: Self-pay | Admitting: Internal Medicine

## 2017-12-15 DIAGNOSIS — E782 Mixed hyperlipidemia: Secondary | ICD-10-CM

## 2017-12-17 ENCOUNTER — Other Ambulatory Visit: Payer: Medicare HMO | Admitting: Internal Medicine

## 2017-12-17 DIAGNOSIS — E782 Mixed hyperlipidemia: Secondary | ICD-10-CM

## 2017-12-17 LAB — LIPID PANEL
Cholesterol: 205 mg/dL — ABNORMAL HIGH (ref <200)
HDL: 65 mg/dL (ref >50)
LDL Cholesterol (Calc): 125 mg/dL (calc) — ABNORMAL HIGH
Non-HDL Cholesterol (Calc): 140 mg/dL (calc) — ABNORMAL HIGH (ref <130)
Total CHOL/HDL Ratio: 3.2 (calc) (ref <5.0)
Triglycerides: 49 mg/dL (ref <150)

## 2017-12-20 ENCOUNTER — Other Ambulatory Visit: Payer: Self-pay | Admitting: Internal Medicine

## 2017-12-20 NOTE — Telephone Encounter (Signed)
Does pt still need this now she is back home from high altitude trip?

## 2017-12-21 DIAGNOSIS — H10023 Other mucopurulent conjunctivitis, bilateral: Secondary | ICD-10-CM | POA: Diagnosis not present

## 2017-12-22 DIAGNOSIS — M9904 Segmental and somatic dysfunction of sacral region: Secondary | ICD-10-CM | POA: Diagnosis not present

## 2017-12-22 DIAGNOSIS — D229 Melanocytic nevi, unspecified: Secondary | ICD-10-CM | POA: Diagnosis not present

## 2017-12-22 DIAGNOSIS — M5136 Other intervertebral disc degeneration, lumbar region: Secondary | ICD-10-CM | POA: Diagnosis not present

## 2017-12-22 DIAGNOSIS — M9901 Segmental and somatic dysfunction of cervical region: Secondary | ICD-10-CM | POA: Diagnosis not present

## 2017-12-22 DIAGNOSIS — M9902 Segmental and somatic dysfunction of thoracic region: Secondary | ICD-10-CM | POA: Diagnosis not present

## 2017-12-22 DIAGNOSIS — M5030 Other cervical disc degeneration, unspecified cervical region: Secondary | ICD-10-CM | POA: Diagnosis not present

## 2017-12-22 DIAGNOSIS — L570 Actinic keratosis: Secondary | ICD-10-CM | POA: Diagnosis not present

## 2017-12-22 DIAGNOSIS — M9903 Segmental and somatic dysfunction of lumbar region: Secondary | ICD-10-CM | POA: Diagnosis not present

## 2017-12-24 ENCOUNTER — Encounter: Payer: Self-pay | Admitting: Internal Medicine

## 2017-12-24 ENCOUNTER — Ambulatory Visit (INDEPENDENT_AMBULATORY_CARE_PROVIDER_SITE_OTHER): Payer: Medicare HMO | Admitting: Internal Medicine

## 2017-12-24 VITALS — BP 100/70 | HR 66 | Ht 67.0 in | Wt 157.0 lb

## 2017-12-24 DIAGNOSIS — E7800 Pure hypercholesterolemia, unspecified: Secondary | ICD-10-CM

## 2017-12-24 DIAGNOSIS — F1021 Alcohol dependence, in remission: Secondary | ICD-10-CM

## 2017-12-24 DIAGNOSIS — R69 Illness, unspecified: Secondary | ICD-10-CM | POA: Diagnosis not present

## 2017-12-24 NOTE — Progress Notes (Signed)
   Subjective:    Patient ID: Donna [name], female    DOB: 04-20-1951, 67 y.o.   MRN: [id number]  HPI   67 year old Female for follow-up of hyperlipidemia.  7 months ago total cholesterol was 261 and is now 205.  LDL cholesterol was 165 and is now 125.  I am pleased with her progress.  She is been able to do this with diet and exercise.  With regard to alcoholism in recovery she is doing well.  She was recently on a trip out [place] and her blood pressure became elevated at high altitude with headache.  We had to call in diuretic for her.  This is happened to her before in high altitudes.    Review of Systems see above no new complaints     Objective:   Physical Exam Spent 15 minutes speaking with her about these issues.  She was not examined today. Blood pressure excellent 100/70.      Assessment & Plan:  High-altitude sickness with elevated blood pressure treated with diuretic temporarily  Hyperlipidemia-improved with diet and exercise  Alcoholism in recovery  Plan: Return in February for physical exam and fasting labs.

## 2017-12-24 NOTE — Patient Instructions (Addendum)
No statin med at present time. Continue diet and exercise.

## 2018-01-03 DIAGNOSIS — H43393 Other vitreous opacities, bilateral: Secondary | ICD-10-CM | POA: Diagnosis not present

## 2018-01-03 DIAGNOSIS — H43392 Other vitreous opacities, left eye: Secondary | ICD-10-CM | POA: Diagnosis not present

## 2018-01-03 DIAGNOSIS — Z961 Presence of intraocular lens: Secondary | ICD-10-CM | POA: Diagnosis not present

## 2018-01-03 DIAGNOSIS — H43391 Other vitreous opacities, right eye: Secondary | ICD-10-CM | POA: Diagnosis not present

## 2018-01-06 ENCOUNTER — Other Ambulatory Visit: Payer: Self-pay | Admitting: Internal Medicine

## 2018-01-11 DIAGNOSIS — H43393 Other vitreous opacities, bilateral: Secondary | ICD-10-CM | POA: Diagnosis not present

## 2018-01-12 ENCOUNTER — Other Ambulatory Visit: Payer: Self-pay | Admitting: Internal Medicine

## 2018-01-13 NOTE — Telephone Encounter (Signed)
Does pt need this now? Was prescribed for elevated BP when traveling out [place]

## 2018-01-14 ENCOUNTER — Other Ambulatory Visit: Payer: Self-pay | Admitting: Internal Medicine

## 2018-01-24 ENCOUNTER — Other Ambulatory Visit: Payer: Self-pay

## 2018-01-25 ENCOUNTER — Encounter: Payer: Self-pay | Admitting: Sports Medicine

## 2018-01-25 ENCOUNTER — Ambulatory Visit
Admission: RE | Admit: 2018-01-25 | Discharge: 2018-01-25 | Disposition: A | Discharge status: Home / Self Care | Payer: Medicare HMO | Source: Ambulatory Visit | Attending: Sports Medicine | Admitting: Sports Medicine

## 2018-01-25 ENCOUNTER — Ambulatory Visit: Payer: Medicare HMO | Admitting: Sports Medicine

## 2018-01-25 VITALS — BP 126/70 | Ht 67.0 in | Wt 157.0 lb

## 2018-01-25 DIAGNOSIS — M5030 Other cervical disc degeneration, unspecified cervical region: Secondary | ICD-10-CM | POA: Diagnosis not present

## 2018-01-25 DIAGNOSIS — M47812 Spondylosis without myelopathy or radiculopathy, cervical region: Secondary | ICD-10-CM | POA: Diagnosis not present

## 2018-01-25 DIAGNOSIS — R0781 Pleurodynia: Secondary | ICD-10-CM | POA: Diagnosis not present

## 2018-01-25 DIAGNOSIS — M542 Cervicalgia: Secondary | ICD-10-CM | POA: Diagnosis not present

## 2018-01-26 ENCOUNTER — Encounter: Payer: Self-pay | Admitting: Sports Medicine
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
## 2018-01-31 DIAGNOSIS — H43391 Other vitreous opacities, right eye: Secondary | ICD-10-CM | POA: Diagnosis not present

## 2018-02-07 ENCOUNTER — Encounter: Payer: Self-pay | Admitting: Physical Therapy

## 2018-02-07 ENCOUNTER — Ambulatory Visit: Payer: Medicare HMO | Attending: Sports Medicine | Admitting: Physical Therapy

## 2018-02-07 DIAGNOSIS — M542 Cervicalgia: Secondary | ICD-10-CM | POA: Diagnosis not present

## 2018-02-07 DIAGNOSIS — R252 Cramp and spasm: Secondary | ICD-10-CM | POA: Diagnosis not present

## 2018-02-07 DIAGNOSIS — H43391 Other vitreous opacities, right eye: Secondary | ICD-10-CM | POA: Diagnosis not present

## 2018-02-07 NOTE — Therapy (Signed)
[address] [place], [place], [address] Phone: [phone]   Fax:  [phone]  Physical Therapy Evaluation  Patient Details  Name: Donna [name] MRN: [id number] Date of Birth: [birth year] Referring Provider (PT): Lilia Argue   Encounter Date: 02/07/2018  PT End of Session - 02/07/18 1013    Visit Number  1    Date for PT Re-Evaluation  04/09/18    PT Start Time  0930    PT Stop Time  6962    PT Time Calculation (min)  45 min       Past Medical History:  Diagnosis Date  . Allergy   . Anxiety   . Arthritis   . Cancer ([place])   . Cataract   . Depression   . Diverticula of colon   . GERD (gastroesophageal reflux disease)   . Heart murmur   . Hiatal hernia   . Hyperlipidemia   . Hypertension   . IBS (irritable bowel syndrome)   . Substance abuse [facility])     Past Surgical History:  Procedure Laterality Date  . ANKLE SURGERY     Bilateral   . CATARACT EXTRACTION Bilateral   . TUBAL LIGATION      There were no vitals filed for this visit.   Subjective Assessment - 02/07/18 0927    Subjective  Patient with a recent x-rays that showed DDD with foraminal narrowing at C5-7.  Patient reports that she has had months of neck pain.  Reports recent issues with left arm movements.      Limitations  Lifting;House hold activities    Patient Stated Goals  have less pain, better motions    Currently in Pain?  Yes    Pain Score  3     Pain Location  Neck    Pain Orientation  Left    Pain Descriptors / Indicators  Sore;Throbbing;Aching    Pain Type  Acute pain    Pain Radiating Towards  denies    Pain Onset  More than a month ago    Pain Frequency  Constant    Aggravating Factors   worse as the day goes on, reaching, activity pain up to 10/10    Pain Relieving Factors  better in the AM, rest  at best pain a 3/10    Effect of Pain on Daily Activities  limits everything when it is a 10/10 I cannot  drive         [facility] PT Assessment - 02/07/18 0001      Assessment   Medical Diagnosis  neck pain    Referring Provider (PT)  Lilia Argue    Onset Date/Surgical Date  01/07/18    Hand Dominance  Right    Prior Therapy  yes a few visits earlier this year      Precautions   Precautions  None      Balance Screen   Has the patient fallen in the past 6 months  No    Has the patient had a decrease in activity level because of a fear of falling?   No    Is the patient reluctant to leave their home because of a fear of falling?   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    Leisure  teaches yoga      Posture/Postural Control   Posture Comments  fwd head, rounded shoulders, straight neck      AROM   Overall AROM Comments  Cervcial ROM decreaed 25% for all motions except side bending decreased 50%, she was very gaurded with her motions and all caused dome pain, shoulder ROM WFL's      Strength   Overall Strength Comments  strength of the UE's 4/5      Palpation   Palpation comment  She is very tender with spasms present in the upper trap, the levator, the supra and infra spinatus and the rhombods and teres                Objective measurements completed on examination: See above findings.        Trigger Point Dry Needling - 02/07/18 1011    Consent Given?  Yes    Education Handout Provided  Yes    Muscles Treated Upper Body  Upper trapezius;Rhomboids;Supraspinatus;Infraspinatus    Upper Trapezius Response  Palpable increased muscle length    Rhomboids Response  Palpable increased muscle length    Supraspinatus Response  Twitch response elicited    Infraspinatus Response  Twitch response elicited                PT Long Term Goals - 02/07/18 1018      PT LONG TERM GOAL #1   Title   understand proper posture and body mechnaics    Time  8    Period  Weeks    Status  New      PT LONG TERM GOAL #2   Title  increase cervical ROM to WNL's    Time  8    Period  Weeks    Status  New      PT LONG TERM GOAL #3   Title  report pain in the neck decreased 50%    Time  8    Period  Weeks    Status  New      PT LONG TERM GOAL #4   Title  report no difficulty with reaching    Time  8    Period  Weeks    Status  New             Plan - 02/07/18 1013    Clinical Impression Statement  Patient was seen here earlier in the year for neck pain, this was resolved, reports that she was pulling weed in August and feels like the pain started again, x-rays show DDD and some stenosis.  She has mostly left shoulder mm pain, the traps the levator, rhomboids and the infra and supraspinatus are very tender with knots.  She denies any radicular sx's.  her ROM of the cervical spine is limited 25% with tightness.  Shoulder ROM is WNL's    Clinical Presentation  Stable    Clinical Decision Making  Low    Rehab Potential  Good    PT Frequency  2x / week    PT Duration  8 weeks    PT Treatment/Interventions  Neuromuscular re-education;Therapeutic exercise;Therapeutic activities;Patient/family education;Manual techniques    PT Next Visit Plan  tried dry needling will try traction    Consulted and Agree with Plan of Care  Patient       Patient will benefit from skilled therapeutic intervention in order to improve the following deficits and  impairments:  Postural dysfunction, Impaired flexibility, Pain, Increased muscle spasms, Increased fascial restricitons, Decreased range of motion  Visit Diagnosis: Cervicalgia - Plan: PT plan of care cert/re-cert  Cramp and spasm - Plan: PT plan of care cert/re-cert     Problem List Patient Active Problem List   Diagnosis Date Noted  . Neck pain 06/15/2017  . Hx of adenomatous colonic polyps 08/03/2015  . Posterior tibial tendonitis of  right leg 07/01/2015  . Herpes simplex 06/15/2014  . Spasm of cervical paraspinous muscle 05/12/2014  . GERD (gastroesophageal reflux disease) 10/14/2013  . Anxiety state, unspecified 10/14/2013  . Recovering alcoholic in remission ([place]) 10/14/2013  . Allergic rhinitis 10/14/2013  . Heart murmur 05/25/2012  . Back pain, lumbosacral 01/19/2012  . Hallux rigidus of right foot 04/08/2011    Sumner Boast., PT 02/07/2018, 10:21 AM  [address] [place], [place], [address] Phone: [phone]   Fax:  [phone]  Name: Donna [name] MRN: [id number] Date of Birth: 21-Aug-1950

## 2018-02-07 NOTE — Patient Instructions (Signed)

## 2018-02-08 DIAGNOSIS — L821 Other seborrheic keratosis: Secondary | ICD-10-CM | POA: Diagnosis not present

## 2018-02-08 DIAGNOSIS — D229 Melanocytic nevi, unspecified: Secondary | ICD-10-CM | POA: Diagnosis not present

## 2018-02-10 ENCOUNTER — Encounter: Payer: Self-pay | Admitting: Physical Therapy

## 2018-02-10 ENCOUNTER — Ambulatory Visit: Payer: Medicare HMO | Attending: Sports Medicine | Admitting: Physical Therapy

## 2018-02-10 DIAGNOSIS — M542 Cervicalgia: Secondary | ICD-10-CM | POA: Insufficient documentation

## 2018-02-10 DIAGNOSIS — R252 Cramp and spasm: Secondary | ICD-10-CM | POA: Diagnosis not present

## 2018-02-10 DIAGNOSIS — M25571 Pain in right ankle and joints of right foot: Secondary | ICD-10-CM | POA: Diagnosis not present

## 2018-02-10 NOTE — Therapy (Signed)
[address] [address] [place], [place], [address] Phone: [phone]   Fax:  [phone]  Physical Therapy Treatment  Patient Details  Name: Donna [name] MRN: [id number] Date of Birth: 02-03-1951 Referring Provider (PT): Lilia Argue   Encounter Date: 02/10/2018  PT End of Session - 02/10/18 1726    Visit Number  2    Date for PT Re-Evaluation  04/09/18    PT Start Time  1600    PT Stop Time  1640    PT Time Calculation (min)  40 min    Activity Tolerance  Patient tolerated treatment well    Behavior During Therapy  [facility] for tasks assessed/performed       Past Medical History:  Diagnosis Date  . Allergy   . Anxiety   . Arthritis   . Cancer ([place])   . Cataract   . Depression   . Diverticula of colon   . GERD (gastroesophageal reflux disease)   . Heart murmur   . Hiatal hernia   . Hyperlipidemia   . Hypertension   . IBS (irritable bowel syndrome)   . Substance abuse [facility])     Past Surgical History:  Procedure Laterality Date  . ANKLE SURGERY     Bilateral   . CATARACT EXTRACTION Bilateral   . TUBAL LIGATION      There were no vitals filed for this visit.  Subjective Assessment - 02/10/18 1723    Subjective  Patient reports that she felt a little better    Currently in Pain?  Yes    Pain Score  4     Pain Location  Neck    Pain Orientation  Left                       [facility] Adult PT Treatment/Exercise - 02/10/18 0001      Manual Therapy   Manual Therapy  Manual Traction;Neural Stretch;Passive ROM    Passive ROM  to the neck    Manual Traction  occipital realease, gentle manual traction    Neural Stretch  for the upper trap and the levator       Trigger Point Dry Needling - 02/10/18 1725    Muscles Treated Upper Body  Upper trapezius;Levator scapulae;Rhomboids    Upper Trapezius Response  Twitch reponse elicited    Levator Scapulae Response  Twitch response elicited     Supraspinatus Response  Twitch response elicited                PT Long Term Goals - 02/07/18 1018      PT LONG TERM GOAL #1   Title  understand proper posture and body mechnaics    Time  8    Period  Weeks    Status  New      PT LONG TERM GOAL #2   Title  increase cervical ROM to WNL's    Time  8    Period  Weeks    Status  New      PT LONG TERM GOAL #3   Title  report pain in the neck decreased 50%    Time  8    Period  Weeks    Status  New      PT LONG TERM GOAL #4   Title  report no difficulty with reaching    Time  8    Period  Weeks    Status  New            Plan - 02/10/18 1726    Clinical Impression Statement  Patient with some really good twitch responses in the levator and the upper traps.  She really seemed to tolerate the needles even though she had a lot of anxiety about it    PT Next Visit Plan  may look at [facility] traction    Consulted and Agree with Plan of Care  Patient       Patient will benefit from skilled therapeutic intervention in order to improve the following deficits and impairments:  Postural dysfunction, Impaired flexibility, Pain, Increased muscle spasms, Increased fascial restricitons, Decreased range of motion  Visit Diagnosis: Cervicalgia  Cramp and spasm  Pain in right ankle and joints of right foot     Problem List Patient Active Problem List   Diagnosis Date Noted  . Neck pain 06/15/2017  . Hx of adenomatous colonic polyps 08/03/2015  . Posterior tibial tendonitis of right leg 07/01/2015  . Herpes simplex 06/15/2014  . Spasm of cervical paraspinous muscle 05/12/2014  . GERD (gastroesophageal reflux disease) 10/14/2013  . Anxiety state, unspecified 10/14/2013  . Recovering alcoholic in remission ([place]) 10/14/2013  . Allergic rhinitis 10/14/2013  . Heart murmur 05/25/2012  . Back pain, lumbosacral 01/19/2012  . Hallux rigidus of right foot 04/08/2011    Sumner Boast., PT 02/10/2018, 5:29  PM  [place] [address] [place], [place], [address] Phone: [phone]   Fax:  [phone]  Name: Donna [name] MRN: [id number] Date of Birth: 04-17-1951

## 2018-02-14 ENCOUNTER — Encounter: Payer: Self-pay | Admitting: Physical Therapy

## 2018-02-14 ENCOUNTER — Ambulatory Visit: Payer: Medicare HMO | Admitting: Physical Therapy

## 2018-02-14 DIAGNOSIS — M542 Cervicalgia: Secondary | ICD-10-CM | POA: Diagnosis not present

## 2018-02-14 DIAGNOSIS — R252 Cramp and spasm: Secondary | ICD-10-CM

## 2018-02-14 DIAGNOSIS — M25571 Pain in right ankle and joints of right foot: Secondary | ICD-10-CM | POA: Diagnosis not present

## 2018-02-14 NOTE — Therapy (Signed)
[address] [address] [place], [place], [address] Phone: [phone]   Fax:  [phone]  Physical Therapy Treatment  Patient Details  Name: Donna [name] MRN: [id number] Date of Birth: [birth year] Referring Provider (PT): Lilia Argue   Encounter Date: 02/14/2018  PT End of Session - 02/14/18 0922    Visit Number  3    Date for PT Re-Evaluation  04/09/18    PT Start Time  0840    PT Stop Time  0930    PT Time Calculation (min)  50 min    Activity Tolerance  Patient tolerated treatment well    Behavior During Therapy  [facility] for tasks assessed/performed       Past Medical History:  Diagnosis Date  . Allergy   . Anxiety   . Arthritis   . Cancer ([place])   . Cataract   . Depression   . Diverticula of colon   . GERD (gastroesophageal reflux disease)   . Heart murmur   . Hiatal hernia   . Hyperlipidemia   . Hypertension   . IBS (irritable bowel syndrome)   . Substance abuse [facility])     Past Surgical History:  Procedure Laterality Date  . ANKLE SURGERY     Bilateral   . CATARACT EXTRACTION Bilateral   . TUBAL LIGATION      There were no vitals filed for this visit.  Subjective Assessment - 02/14/18 0841    Subjective  Patient reports that she tried some overhead stuff over the weekend and really aggravated the upper traps    Currently in Pain?  Yes    Pain Score  3     Pain Location  Neck    Aggravating Factors   overhead work                       [employer] Adult PT Treatment/Exercise - 02/14/18 0001      Exercises   Exercises  Neck      Neck Exercises: Machines for Strengthening   UBE (Upper Arm Bike)  level 3 x 4 minutes      Neck Exercises: Standing   Other Standing Exercises  W backs, weighted ball over head, Red tband ER      Neck Exercises: Supine   Neck Retraction  15 reps    Neck Retraction Limitations  head on ball      Manual Therapy   Passive ROM  to the neck, to  end range and then gentle isometric contract relax    Manual Traction  occipital realease, gentle manual traction    Neural Stretch  for the upper trap and the levator       Trigger Point Dry Needling - 02/14/18 0921    Consent Given?  Yes    Upper Trapezius Response  Twitch reponse elicited;Palpable increased muscle length                PT Long Term Goals - 02/14/18 0923      PT LONG TERM GOAL #1   Title  understand proper posture and body mechnaics    Status  On-going      PT LONG TERM GOAL #2   Title  increase cervical ROM to WNL's    Status  On-going            Plan - 02/14/18 2706    Clinical Impression Statement  during exercises she needs cues to  not elevate shoulders.  A lot of tenderness and tightness in the upper traps and the levator mms.    PT Next Visit Plan  may look at [facility] traction    Consulted and Agree with Plan of Care  Patient       Patient will benefit from skilled therapeutic intervention in order to improve the following deficits and impairments:  Postural dysfunction, Impaired flexibility, Pain, Increased muscle spasms, Increased fascial restricitons, Decreased range of motion  Visit Diagnosis: Cervicalgia  Cramp and spasm     Problem List Patient Active Problem List   Diagnosis Date Noted  . Neck pain 06/15/2017  . Hx of adenomatous colonic polyps 08/03/2015  . Posterior tibial tendonitis of right leg 07/01/2015  . Herpes simplex 06/15/2014  . Spasm of cervical paraspinous muscle 05/12/2014  . GERD (gastroesophageal reflux disease) 10/14/2013  . Anxiety state, unspecified 10/14/2013  . Recovering alcoholic in remission ([place]) 10/14/2013  . Allergic rhinitis 10/14/2013  . Heart murmur 05/25/2012  . Back pain, lumbosacral 01/19/2012  . Hallux rigidus of right foot 04/08/2011    Sumner Boast., PT 02/14/2018, 9:23 AM  [address]  [place], [place], [address] Phone: [phone]   Fax:  [phone]  Name: Donna [name] MRN: [id number] Date of Birth: March 17, 1951

## 2018-02-17 ENCOUNTER — Ambulatory Visit: Payer: Medicare HMO | Admitting: Physical Therapy

## 2018-02-17 DIAGNOSIS — R252 Cramp and spasm: Secondary | ICD-10-CM | POA: Insufficient documentation

## 2018-02-17 DIAGNOSIS — J028 Acute pharyngitis due to other specified organisms: Secondary | ICD-10-CM | POA: Diagnosis not present

## 2018-02-17 DIAGNOSIS — B9789 Other viral agents as the cause of diseases classified elsewhere: Secondary | ICD-10-CM | POA: Diagnosis not present

## 2018-02-17 DIAGNOSIS — J208 Acute bronchitis due to other specified organisms: Secondary | ICD-10-CM | POA: Diagnosis not present

## 2018-02-17 DIAGNOSIS — J019 Acute sinusitis, unspecified: Secondary | ICD-10-CM | POA: Diagnosis not present

## 2018-02-17 DIAGNOSIS — N952 Postmenopausal atrophic vaginitis: Secondary | ICD-10-CM | POA: Insufficient documentation

## 2018-02-22 ENCOUNTER — Ambulatory Visit: Payer: Medicare HMO | Admitting: Physical Therapy

## 2018-02-22 DIAGNOSIS — M542 Cervicalgia: Secondary | ICD-10-CM | POA: Diagnosis not present

## 2018-02-22 DIAGNOSIS — R252 Cramp and spasm: Secondary | ICD-10-CM | POA: Diagnosis not present

## 2018-02-22 DIAGNOSIS — M25571 Pain in right ankle and joints of right foot: Secondary | ICD-10-CM | POA: Diagnosis not present

## 2018-02-22 NOTE — Therapy (Signed)
[address] [place], [place], [address] Phone: [phone]   Fax:  [phone]  Physical Therapy Treatment  Patient Details  Name: Donna [name] MRN: [id number] Date of Birth: 01/23/1951 Referring Provider (PT): Lilia Argue   Encounter Date: 02/22/2018  PT End of Session - 02/22/18 1349    Visit Number  4    Date for PT Re-Evaluation  04/09/18    PT Start Time  8270    PT Stop Time  1429    PT Time Calculation (min)  40 min    Activity Tolerance  Patient tolerated treatment well    Behavior During Therapy  [facility] for tasks assessed/performed       Past Medical History:  Diagnosis Date  . Allergy   . Anxiety   . Arthritis   . Cancer ([place])   . Cataract   . Depression   . Diverticula of colon   . GERD (gastroesophageal reflux disease)   . Heart murmur   . Hiatal hernia   . Hyperlipidemia   . Hypertension   . IBS (irritable bowel syndrome)   . Substance abuse [facility])     Past Surgical History:  Procedure Laterality Date  . ANKLE SURGERY     Bilateral   . CATARACT EXTRACTION Bilateral   . TUBAL LIGATION      There were no vitals filed for this visit.  Subjective Assessment - 02/22/18 1349    Subjective  pt states her ROM has improved but still feels pain on left     Patient Stated Goals  have less pain, better motions    Currently in Pain?  Yes    Pain Score  3     Pain Location  Neck    Pain Orientation  Left    Pain Descriptors / Indicators  Aching;Sore    Pain Type  Acute pain    Pain Onset  More than a month ago    Pain Frequency  Constant    Aggravating Factors   OH WORK, REACHING    Pain Relieving Factors  rest     Effect of Pain on Daily Activities  limited                       [facility] Adult PT Treatment/Exercise - 02/22/18 0001      Self-Care   Self-Care  Other Self-Care Comments    Other Self-Care Comments   self traction using yoga strap; attempted  rotation but decidec on just traction      Exercises   Exercises  Neck      Manual Therapy   Manual Therapy  Joint mobilization;Soft tissue mobilization;Myofascial release    Joint Mobilization  O/A, A/A rotation mobs bil in prone; Cspine and Tspine PA mobs central and unilateral.    Soft tissue mobilization  to UT/levator and cspine; also suboccipitals    Myofascial Release  to L levator scapula    Manual Traction  seated distraction with rotation 2x5 bil        Trigger Point Dry Needling - 02/22/18 1549    Consent Given?  Yes    Muscles Treated Upper Body  Levator scapulae;Upper trapezius    Upper Trapezius Response  Twitch reponse elicited;Palpable increased muscle length   R>L   Levator Scapulae Response  Twitch response elicited;Palpable increased muscle length   left  PT Long Term Goals - 02/22/18 1552      PT LONG TERM GOAL #1   Title  understand proper posture and body mechnaics    Time  8    Period  Weeks    Status  On-going      PT LONG TERM GOAL #2   Title  increase cervical ROM to WNL's    Baseline  full right rotation after Rx today    Time  8    Period  Weeks    Status  On-going      PT LONG TERM GOAL #3   Title  report pain in the neck decreased 50%    Time  8    Period  Weeks    Status  On-going      PT LONG TERM GOAL #4   Title  report no difficulty with reaching    Time  8    Period  Weeks    Status  On-going            Plan - 02/22/18 1550    Clinical Impression Statement  pt responded very well to upper cervical mobilizations and to distraction with rotation demonstrating full R rotation afterwards and significantly improved left rotation with decreased pain bil. She continues to be stiff with mobs for left upper cerv rotation and would benefit from further treatment here.    Rehab Potential  Good    PT Frequency  2x / week    PT Duration  8 weeks    PT Treatment/Interventions  Neuromuscular  re-education;Therapeutic exercise;Therapeutic activities;Patient/family education;Manual techniques       Patient will benefit from skilled therapeutic intervention in order to improve the following deficits and impairments:  Postural dysfunction, Impaired flexibility, Pain, Increased muscle spasms, Increased fascial restricitons, Decreased range of motion  Visit Diagnosis: Cervicalgia  Cramp and spasm     Problem List Patient Active Problem List   Diagnosis Date Noted  . Neck pain 06/15/2017  . Hx of adenomatous colonic polyps 08/03/2015  . Posterior tibial tendonitis of right leg 07/01/2015  . Herpes simplex 06/15/2014  . Spasm of cervical paraspinous muscle 05/12/2014  . GERD (gastroesophageal reflux disease) 10/14/2013  . Anxiety state, unspecified 10/14/2013  . Recovering alcoholic in remission ([place]) 10/14/2013  . Allergic rhinitis 10/14/2013  . Heart murmur 05/25/2012  . Back pain, lumbosacral 01/19/2012  . Hallux rigidus of right foot 04/08/2011    Ledon Weihe PT 02/22/2018, 3:56 PM  [address] [place], [place], [address] Phone: [phone]   Fax:  [phone]  Name: Donna [name] MRN: [id number] Date of Birth: 02-21-1951

## 2018-02-23 ENCOUNTER — Ambulatory Visit: Payer: Medicare HMO | Admitting: Physical Therapy

## 2018-02-23 ENCOUNTER — Encounter: Payer: Self-pay | Admitting: Physical Therapy

## 2018-02-23 DIAGNOSIS — M25571 Pain in right ankle and joints of right foot: Secondary | ICD-10-CM

## 2018-02-23 DIAGNOSIS — M542 Cervicalgia: Secondary | ICD-10-CM

## 2018-02-23 DIAGNOSIS — R252 Cramp and spasm: Secondary | ICD-10-CM | POA: Diagnosis not present

## 2018-02-23 NOTE — Therapy (Signed)
[address] [place], [place], [address] Phone: [phone]   Fax:  [phone]  Physical Therapy Treatment  Patient Details  Name: MARASIA NEWHALL MRN: [id number] Date of Birth: 04-Feb-1951 Referring Provider (PT): Lilia Argue   Encounter Date: 02/23/2018  PT End of Session - 02/23/18 1635    Visit Number  5    Date for PT Re-Evaluation  04/09/18    PT Start Time  1440    PT Stop Time  1520    PT Time Calculation (min)  40 min    Activity Tolerance  Patient tolerated treatment well    Behavior During Therapy  [facility] for tasks assessed/performed       Past Medical History:  Diagnosis Date  . Allergy   . Anxiety   . Arthritis   . Cancer ([place])   . Cataract   . Depression   . Diverticula of colon   . GERD (gastroesophageal reflux disease)   . Heart murmur   . Hiatal hernia   . Hyperlipidemia   . Hypertension   . IBS (irritable bowel syndrome)   . Substance abuse [facility])     Past Surgical History:  Procedure Laterality Date  . ANKLE SURGERY     Bilateral   . CATARACT EXTRACTION Bilateral   . TUBAL LIGATION      There were no vitals filed for this visit.  Subjective Assessment - 02/23/18 1631    Subjective  Patient reports that she continues to improve with motion and less pain, c/o some stiffness in the neck and the thoracic area    Currently in Pain?  Yes    Pain Score  2     Pain Location  Neck    Pain Orientation  Left                       [facility] Adult PT Treatment/Exercise - 02/23/18 0001      Manual Therapy   Manual Therapy  Joint mobilization;Soft tissue mobilization;Myofascial release    Joint Mobilization  O/A, A/A rotation mobs bil in prone; Cspine and Tspine PA mobs central and unilateral., taught some self mobilization over pool noodle, also did resisted breathing for costovertebral area    Soft tissue mobilization  to UT/levator and cspine; also  suboccipitals    Myofascial Release  to L levator scapula    Passive ROM  of the neck and trunk to end ranges, some contract relax to gain more ROM    Manual Traction  seated distraction with rotation 2x5 bil        Trigger Point Dry Needling - 02/23/18 1633    Consent Given?  Yes    Upper Trapezius Response  Twitch reponse elicited    Levator Scapulae Response  Palpable increased muscle length                PT Long Term Goals - 02/22/18 1552      PT LONG TERM GOAL #1   Title  understand proper posture and body mechnaics    Time  8    Period  Weeks    Status  On-going      PT LONG TERM GOAL #2   Title  increase cervical ROM to WNL's    Baseline  full right rotation after Rx today    Time  8    Period  Weeks    Status  On-going  PT LONG TERM GOAL #3   Title  report pain in the neck decreased 50%    Time  8    Period  Weeks    Status  On-going      PT LONG TERM GOAL #4   Title  report no difficulty with reaching    Time  8    Period  Weeks    Status  On-going            Plan - 02/23/18 1635    Clinical Impression Statement  Patient is very stiff with cervical side bending, she is very stiff in the mid thoracic area with PA and some rotational mobilizations Grade II/III.  She is also stiff in the ribs, less mobility with deep breathing.  We tried some treatment on this today    PT Next Visit Plan  may look at [facility] traction    Consulted and Agree with Plan of Care  Patient       Patient will benefit from skilled therapeutic intervention in order to improve the following deficits and impairments:  Postural dysfunction, Impaired flexibility, Pain, Increased muscle spasms, Increased fascial restricitons, Decreased range of motion  Visit Diagnosis: Cervicalgia  Cramp and spasm  Pain in right ankle and joints of right foot     Problem List Patient Active Problem List   Diagnosis Date Noted  . Neck pain 06/15/2017  . Hx of adenomatous  colonic polyps 08/03/2015  . Posterior tibial tendonitis of right leg 07/01/2015  . Herpes simplex 06/15/2014  . Spasm of cervical paraspinous muscle 05/12/2014  . GERD (gastroesophageal reflux disease) 10/14/2013  . Anxiety state, unspecified 10/14/2013  . Recovering alcoholic in remission ([place]) 10/14/2013  . Allergic rhinitis 10/14/2013  . Heart murmur 05/25/2012  . Back pain, lumbosacral 01/19/2012  . Hallux rigidus of right foot 04/08/2011    Sumner Boast., PT 02/23/2018, 4:37 PM  [address] [place], [place], [address] Phone: [phone]   Fax:  [phone]  Name: BRITANY CALLICOTT MRN: [id number] Date of Birth: 1950-11-11

## 2018-02-28 ENCOUNTER — Ambulatory Visit: Payer: Medicare HMO | Admitting: Physical Therapy

## 2018-02-28 ENCOUNTER — Encounter: Payer: Self-pay | Admitting: Physical Therapy

## 2018-02-28 DIAGNOSIS — R252 Cramp and spasm: Secondary | ICD-10-CM

## 2018-02-28 DIAGNOSIS — M25571 Pain in right ankle and joints of right foot: Secondary | ICD-10-CM | POA: Diagnosis not present

## 2018-02-28 DIAGNOSIS — M542 Cervicalgia: Secondary | ICD-10-CM | POA: Diagnosis not present

## 2018-02-28 NOTE — Therapy (Signed)
[address] [place], [place], [address] Phone: [phone]   Fax:  [phone]  Physical Therapy Treatment  Patient Details  Name: MCKENSEY BERGHUIS MRN: [id number] Date of Birth: 04-30-1951 Referring Provider (PT): Lilia Argue   Encounter Date: 02/28/2018  PT End of Session - 02/28/18 0837    Visit Number  6    Date for PT Re-Evaluation  04/09/18    PT Start Time  0750    PT Stop Time  0838    PT Time Calculation (min)  48 min    Activity Tolerance  Patient tolerated treatment well    Behavior During Therapy  [facility] for tasks assessed/performed       Past Medical History:  Diagnosis Date  . Allergy   . Anxiety   . Arthritis   . Cancer ([place])   . Cataract   . Depression   . Diverticula of colon   . GERD (gastroesophageal reflux disease)   . Heart murmur   . Hiatal hernia   . Hyperlipidemia   . Hypertension   . IBS (irritable bowel syndrome)   . Substance abuse [facility])     Past Surgical History:  Procedure Laterality Date  . ANKLE SURGERY     Bilateral   . CATARACT EXTRACTION Bilateral   . TUBAL LIGATION      There were no vitals filed for this visit.  Subjective Assessment - 02/28/18 0751    Subjective  Patient reports that her cervical ROM is much better, less pain but reports seems like her "strength is weak"    Currently in Pain?  Yes    Pain Score  2     Pain Location  Neck    Pain Orientation  Left    Pain Descriptors / Indicators  Sore    Aggravating Factors   "just feels weak"                       [facility] Adult PT Treatment/Exercise - 02/28/18 0001      Exercises   Exercises  Neck      Neck Exercises: Machines for Strengthening   UBE (Upper Arm Bike)  level 3 x 4 minutes    Cybex Row  15# 2x10    Lat Pull  20# 2x10    Other Machines for Strengthening  5# hip abduction and extension, 5# leg extension, 20# leg curls all 2x10, 20# leg press 20# 2x10      Neck Exercises: Standing   Other Standing Exercises  I's, Y's and T's, 3# UE circuit 2 rounds    Other Standing Exercises  W backs, weighted ball over head, Red tband ER      Neck Exercises: Seated   Other Seated Exercise  bent over 4# rows, 3# extension and 2# reverse flies all 2 x 10 reps      Manual Therapy   Manual Therapy  Joint mobilization;Soft tissue mobilization;Myofascial release    Soft tissue mobilization  to UT/levator and cspine; also suboccipitals    Passive ROM  of the neck and trunk to end ranges, some contract relax to gain more ROM    Manual Traction  seated distraction with rotation 2x5 bil        Trigger Point Dry Needling - 02/28/18 0837    Consent Given?  Yes    Upper Trapezius Response  Twitch reponse elicited  PT Education - 02/28/18 0837    Education Details  went over gym activities, sets, reps and weights as well as form and had her demo    Person(s) Educated  Patient    Methods  Explanation;Demonstration;Tactile cues;Verbal cues    Comprehension  Verbalized understanding;Returned demonstration;Verbal cues required;Tactile cues required          PT Long Term Goals - 02/28/18 0839      PT LONG TERM GOAL #1   Title  understand proper posture and body mechnaics    Status  Achieved      PT LONG TERM GOAL #2   Title  increase cervical ROM to WNL's    Status  Achieved      PT LONG TERM GOAL #3   Title  report pain in the neck decreased 50%    Status  Achieved      PT LONG TERM GOAL #4   Title  report no difficulty with reaching    Status  Achieved            Plan - 02/28/18 0838    Clinical Impression Statement  Patient very pleased with her cervical ROM able to [facility] for all motions, had a lot of questions about her going to the gym and what to do and what to avoid, how to do correctly and weight    PT Next Visit Plan  D/C goals met    Consulted and Agree with Plan of Care  Patient       Patient will benefit from  skilled therapeutic intervention in order to improve the following deficits and impairments:  Postural dysfunction, Impaired flexibility, Pain, Increased muscle spasms, Increased fascial restricitons, Decreased range of motion  Visit Diagnosis: Cervicalgia  Cramp and spasm     Problem List Patient Active Problem List   Diagnosis Date Noted  . Neck pain 06/15/2017  . Hx of adenomatous colonic polyps 08/03/2015  . Posterior tibial tendonitis of right leg 07/01/2015  . Herpes simplex 06/15/2014  . Spasm of cervical paraspinous muscle 05/12/2014  . GERD (gastroesophageal reflux disease) 10/14/2013  . Anxiety state, unspecified 10/14/2013  . Recovering alcoholic in remission ([place]) 10/14/2013  . Allergic rhinitis 10/14/2013  . Heart murmur 05/25/2012  . Back pain, lumbosacral 01/19/2012  . Hallux rigidus of right foot 04/08/2011    Sumner Boast., PT 02/28/2018, 8:39 AM  [address] [place], [place], [address] Phone: [phone]   Fax:  [phone]  Name: ATHZIRI FREUNDLICH MRN: [id number] Date of Birth: [birth year]

## 2018-03-03 ENCOUNTER — Ambulatory Visit: Payer: Medicare HMO | Admitting: Physical Therapy

## 2018-03-22 ENCOUNTER — Ambulatory Visit: Payer: Medicare HMO | Admitting: Sports Medicine

## 2018-03-22 ENCOUNTER — Encounter: Payer: Self-pay | Admitting: Sports Medicine

## 2018-03-22 VITALS — BP 133/68 | Ht 67.0 in | Wt 157.0 lb

## 2018-03-22 DIAGNOSIS — M62838 Other muscle spasm: Secondary | ICD-10-CM | POA: Diagnosis not present

## 2018-03-22 MED ORDER — AMITRIPTYLINE HCL 25 MG PO TABS: 25.0000 mg | ORAL_TABLET | Freq: Every day | ORAL | 2 refills | Status: DC

## 2018-03-22 NOTE — Progress Notes (Signed)
   HPI  CC: Shoulder pain  Donna [name] is a 67 year old female presents for left shoulder pain.  She was last seen in September of this year.  At that time she had x-rays obtained of her cervical spine which showed degenerative disc disease throughout, as well as multilevel facet arthritis.  She states that she has been doing this for around 8 months now.  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  She states it is made worse whenever she lifts her arm hang to her side.  It is made better with some movement.  She states she takes ibuprofen occasionally with some relief.  She also uses ice and heat with some relief.  She states she has tried multiple modalities with physical therapy, including dry needling and stretches.  She states this is not helped resolve the pain.  She denies any pain radiating down her arm.  She denies any numbness and tingling down her arm.  She denies any loss of fine motor skills.  She denies any new trauma to the area.  She states the pain is a nagging 2 out of 10 in intensity.  See HPI and/or previous note for associated ROS.  Objective: BP 133/68   Ht 5\' 7"  (1.702 m)   Wt 157 lb (71.2 kg)   BMI 24.59 kg/m  Gen: right-Hand Dominant. NAD, well groomed, a/o x3, normal affect.  CV: Well-perfused. Warm.  Resp: Non-labored.  Neuro: Sensation intact throughout. No gross coordination deficits.  Gait: Nonpathologic posture, unremarkable stride without signs of limp or balance issues.  Back exam: No erythema, warmth, swelling noted.  Tenderness palpation over the left upper border of the trapezius muscle.  Tenderness palpation over the rhomboid muscles in the left side.  No central spinal tenderness.  Full range of motion in forward flexion, extension, side-to-side motion of the neck.  Strength 5 out of 5 throughout testing.  Negative Spurling's test bilaterally.  Left shoulder exam: No erythema,  warmth, swelling noted.  No tenderness palpation on exam.  Full range of motion forward flexion, abduction, internal and external rotation.  Strength 5 out of 5 throughout testing.  Negative speeds test, negative Yergason's test, negative crossover test, negative anti-can test, negative Hawkins test, negative O'Brien's test.  Assessment and plan: Left trapezius muscle pain.  This is likely secondary to underlying cervical degenerative disc disease leading to increased traction on the left side of her trapezius muscle.  We discussed treatment options which she will at today's visit.  At this time we will start her on amitriptyline 25 mg at nighttime.  Hopefully this will block some of the signal she is getting causing some of the nerve irritation.  I believe this pain is likely secondary to traction on the trap muscle from degenerative disc disease in the cervical spine.  Will start this medication for 1 to 3 months, and see her back in 2 months to reassess for improvement.  She can continue with the physical therapy exercises as tolerated.  I do not believe this is coming from her shoulder.  Donna Rife, MD [place] Fellow 03/22/2018 1:22 PM

## 2018-03-22 NOTE — Patient Instructions (Signed)
Thank you for coming to see [place] in clinic today.  You were seen for your left trapezius muscle pain today.  We are going to start you on amitriptyline 25 mg nightly at this time.  We also advised you to do 30 arm swings in each arm, twice a day.  We will see back for follow-up in 2 months.

## 2018-03-28 ENCOUNTER — Other Ambulatory Visit: Payer: Self-pay | Admitting: Internal Medicine

## 2018-04-19 DIAGNOSIS — L570 Actinic keratosis: Secondary | ICD-10-CM | POA: Diagnosis not present

## 2018-04-19 DIAGNOSIS — D229 Melanocytic nevi, unspecified: Secondary | ICD-10-CM | POA: Diagnosis not present

## 2018-04-27 ENCOUNTER — Other Ambulatory Visit: Payer: Self-pay | Admitting: Internal Medicine

## 2018-05-26 ENCOUNTER — Other Ambulatory Visit: Payer: Self-pay

## 2018-05-26 MED ORDER — PROPRANOLOL HCL 10 MG PO TABS: ORAL_TABLET | ORAL | 3 refills | Status: DC

## 2018-06-01 DIAGNOSIS — M47816 Spondylosis without myelopathy or radiculopathy, lumbar region: Secondary | ICD-10-CM | POA: Diagnosis not present

## 2018-06-01 DIAGNOSIS — M5416 Radiculopathy, lumbar region: Secondary | ICD-10-CM | POA: Diagnosis not present

## 2018-06-01 DIAGNOSIS — M4126 Other idiopathic scoliosis, lumbar region: Secondary | ICD-10-CM | POA: Diagnosis not present

## 2018-06-01 DIAGNOSIS — M5412 Radiculopathy, cervical region: Secondary | ICD-10-CM | POA: Diagnosis not present

## 2018-06-10 DIAGNOSIS — H43393 Other vitreous opacities, bilateral: Secondary | ICD-10-CM | POA: Diagnosis not present

## 2018-06-10 DIAGNOSIS — H35423 Microcystoid degeneration of retina, bilateral: Secondary | ICD-10-CM | POA: Diagnosis not present

## 2018-06-10 DIAGNOSIS — H43813 Vitreous degeneration, bilateral: Secondary | ICD-10-CM | POA: Diagnosis not present

## 2018-06-10 DIAGNOSIS — H35372 Puckering of macula, left eye: Secondary | ICD-10-CM | POA: Diagnosis not present

## 2018-06-13 ENCOUNTER — Encounter: Payer: Self-pay | Admitting: Internal Medicine

## 2018-06-13 ENCOUNTER — Ambulatory Visit: Payer: Medicare HMO | Admitting: Internal Medicine

## 2018-06-13 ENCOUNTER — Ambulatory Visit (INDEPENDENT_AMBULATORY_CARE_PROVIDER_SITE_OTHER)
Admission: RE | Admit: 2018-06-13 | Discharge: 2018-06-13 | Disposition: A | Discharge status: Home / Self Care | Payer: Medicare HMO | Source: Ambulatory Visit | Attending: Internal Medicine | Admitting: Internal Medicine

## 2018-06-13 VITALS — BP 142/78 | HR 62 | Ht 67.0 in | Wt 163.4 lb

## 2018-06-13 DIAGNOSIS — R05 Cough: Secondary | ICD-10-CM | POA: Diagnosis not present

## 2018-06-13 DIAGNOSIS — D045 Carcinoma in situ of skin of trunk: Secondary | ICD-10-CM | POA: Insufficient documentation

## 2018-06-13 DIAGNOSIS — R9389 Abnormal findings on diagnostic imaging of other specified body structures: Secondary | ICD-10-CM

## 2018-06-13 DIAGNOSIS — R0602 Shortness of breath: Secondary | ICD-10-CM | POA: Diagnosis not present

## 2018-06-13 DIAGNOSIS — C4492 Squamous cell carcinoma of skin, unspecified: Secondary | ICD-10-CM

## 2018-06-13 DIAGNOSIS — R058 Other specified cough: Secondary | ICD-10-CM

## 2018-06-13 DIAGNOSIS — C4491 Basal cell carcinoma of skin, unspecified: Secondary | ICD-10-CM

## 2018-06-13 NOTE — Patient Instructions (Addendum)
Consider weaning off your hormones completely   Pantoprazole (protonix) 40 mg   Take  30-60 min before first meal of the day and Pepcid (famotidine)  20 mg one after supper  until return to office - this is the best way to tell whether stomach acid is contributing to your problem.    GERD (REFLUX)  is an extremely common cause of respiratory symptoms just like yours , many times with no obvious heartburn at all.    It can be treated with medication, but also with lifestyle changes including elevation of the head of your bed (ideally with 6 -8inch blocks under the headboard of your bed),  Smoking cessation, avoidance of late meals, excessive alcohol, and avoid fatty foods, chocolate, peppermint, colas, red wine, and acidic juices such as orange juice.  NO MINT OR MENTHOL PRODUCTS SO NO COUGH DROPS  USE SUGARLESS CANDY INSTEAD (Jolley ranchers or Stover's or Life Savers) or even ice chips will also do - the key is to swallow to prevent all throat clearing. NO OIL BASED VITAMINS - use powdered substitutes.  Avoid fish oil when coughing.    Please remember to go to the  x-ray department  for your tests - we will call you with the results when they are available    Please schedule a follow up office visit in 4 weeks, sooner if needed
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
## 2018-06-14 ENCOUNTER — Encounter: Payer: Self-pay | Admitting: Internal Medicine

## 2018-06-14 DIAGNOSIS — R9389 Abnormal findings on diagnostic imaging of other specified body structures: Secondary | ICD-10-CM | POA: Insufficient documentation

## 2018-06-14 MED ORDER — PANTOPRAZOLE SODIUM 40 MG PO TBEC: 40.0000 mg | DELAYED_RELEASE_TABLET | Freq: Every day | ORAL | 2 refills | Status: DC

## 2018-06-14 MED ORDER — FAMOTIDINE 20 MG PO TABS: ORAL_TABLET | ORAL | 11 refills | Status: DC

## 2018-06-14 NOTE — Assessment & Plan Note (Addendum)
Onset 2017  - Esophagitis by EGD 03/05/10 and not on gerd rx chronically  -  Better with cough drops/ worse with cold air exp/ occ nocturnal episodes/ maint on low dose inderal   The most common causes of chronic cough in immunocompetent adults include the following: upper airway cough syndrome (UACS), previously referred to as postnasal drip syndrome (PNDS), which is caused by variety of rhinosinus conditions; (2) asthma; (3) GERD; (4) chronic bronchitis from cigarette smoking or other inhaled environmental irritants; (5) nonasthmatic eosinophilic bronchitis; and (6) bronchiectasis.   These conditions, singly or in combination, have accounted for up to 94% of the causes of chronic cough in prospective studies.   Other conditions have constituted no >6% of the causes in prospective studies These have included bronchogenic carcinoma, chronic interstitial pneumonia, sarcoidosis, left ventricular failure, ACEI-induced cough, and aspiration from a condition associated with pharyngeal dysfunction.    Chronic cough is often simultaneously caused by more than one condition. A single cause has been found from 38 to 82% of the time, multiple causes from 18 to 62%. Multiply caused cough has been the result of three diseases up to 42% of the time.      Of the three most common causes of  Sub-acute / recurrent or chronic cough, only one (GERD)  can actually contribute to/ trigger  the other two (asthma and post nasal drip syndrome)  and perpetuate the cylce of cough.  While not intuitively obvious, many patients with chronic low grade reflux do not cough until there is a primary insult that disturbs the protective epithelial barrier and exposes sensitive nerve endings.   This is typically viral but can due to PNDS and  either may apply here.    >>>>  The point is that once this occurs, it is difficult to eliminate the cycle  using anything but a maximally effective acid suppression regimen at least in the short  run, accompanied by an appropriate diet to address non acid GERD and eliminate all mint/ menthol lozenges then regroup in 4 weeks to address the issue of ? Underlying PF? (absence of cough on deep insp maneuvers argues against PF as cause for cough but doesn't exclude it altogether (see separate a/p)

## 2018-06-14 NOTE — Assessment & Plan Note (Addendum)
CT abd 08/09/17  with nl lung markings so unlikely this is really PF on cxr but can't r/o incipient/ early dz.   Even if she does have early ipf, note:  Use of PPI is associated with improved survival time and with decreased radiologic fibrosis per King's study published in [facility] vol 184 p1390.  Dec 2011 and also may have other beneficial effects as per the latest review in [place] vol 185 T0931 Jun 20016.  >>>  So start gerd rx for cough then we'll review issue on return in 4 weeks with walking sats,  pfts and possibly HRCT then.   Discussed in detail all the  indications, usual  risks and alternatives  relative to the benefits with patient who agrees to proceed with  rx and  w/u as outlined.       Total time devoted to counseling  > 50 % of initial 60 min office visit:  review case with pt/ discussion of options/alternatives/ personally creating written customized instructions  in presence of pt  then going over those specific  Instructions directly with the pt including how to use all of the meds but in particular covering each new medication in detail and the difference between the maintenance= "automatic" meds and the prns using an action plan format for the latter (If this problem/symptom => do that organization reading Left to right).  Please see AVS from this visit for a full list of these instructions which I personally wrote for this pt and  are unique to this visit.

## 2018-06-23 ENCOUNTER — Other Ambulatory Visit: Payer: Medicare HMO | Admitting: Internal Medicine

## 2018-06-23 DIAGNOSIS — F1021 Alcohol dependence, in remission: Secondary | ICD-10-CM

## 2018-06-23 DIAGNOSIS — Z0000 Encounter for general adult medical examination without abnormal findings: Secondary | ICD-10-CM | POA: Diagnosis not present

## 2018-06-23 DIAGNOSIS — K219 Gastro-esophageal reflux disease without esophagitis: Secondary | ICD-10-CM

## 2018-06-23 DIAGNOSIS — R69 Illness, unspecified: Secondary | ICD-10-CM | POA: Diagnosis not present

## 2018-06-23 DIAGNOSIS — E7800 Pure hypercholesterolemia, unspecified: Secondary | ICD-10-CM

## 2018-06-23 DIAGNOSIS — F419 Anxiety disorder, unspecified: Secondary | ICD-10-CM

## 2018-06-23 LAB — CBC WITH DIFFERENTIAL/PLATELET
Absolute Monocytes: 476 cells/uL (ref 200–950)
Basophils Absolute: 31 cells/uL (ref 0–200)
Basophils Relative: 0.8 %
Eosinophils Absolute: 62 cells/uL (ref 15–500)
Eosinophils Relative: 1.6 %
HCT: 37.5 % (ref 35.0–45.0)
Hemoglobin: 12.9 g/dL (ref 11.7–15.5)
Lymphs Abs: 1346 cells/uL (ref 850–3900)
MCH: 30.6 pg (ref 27.0–33.0)
MCHC: 34.4 g/dL (ref 32.0–36.0)
MCV: 88.9 fL (ref 80.0–100.0)
MPV: 9.6 fL (ref 7.5–12.5)
Monocytes Relative: 12.2 %
Neutro Abs: 1985 cells/uL (ref 1500–7800)
Neutrophils Relative %: 50.9 %
Platelets: 308 10*3/uL (ref 140–400)
RBC: 4.22 10*6/uL (ref 3.80–5.10)
RDW: 12.5 % (ref 11.0–15.0)
Total Lymphocyte: 34.5 %
WBC: 3.9 10*3/uL (ref 3.8–10.8)

## 2018-06-23 LAB — COMPLETE METABOLIC PANEL WITH GFR
AG Ratio: 1.6 (calc) (ref 1.0–2.5)
ALT: 15 U/L (ref 6–29)
AST: 17 U/L (ref 10–35)
Albumin: 4.1 g/dL (ref 3.6–5.1)
Alkaline phosphatase (APISO): 77 U/L (ref 37–153)
BUN: 9 mg/dL (ref 7–25)
CO2: 26 mmol/L (ref 20–32)
Calcium: 9.3 mg/dL (ref 8.6–10.4)
Chloride: 100 mmol/L (ref 98–110)
Creat: 0.74 mg/dL (ref 0.50–0.99)
GFR, Est African American: 98 mL/min/{1.73_m2} (ref >=60)
GFR, Est Non African American: 84 mL/min/{1.73_m2} (ref >=60)
Globulin: 2.5 g/dL (calc) (ref 1.9–3.7)
Glucose, Bld: 97 mg/dL (ref 65–99)
Potassium: 4.5 mmol/L (ref 3.5–5.3)
Sodium: 134 mmol/L — ABNORMAL LOW (ref 135–146)
Total Bilirubin: 0.4 mg/dL (ref 0.2–1.2)
Total Protein: 6.6 g/dL (ref 6.1–8.1)

## 2018-06-23 LAB — LIPID PANEL
Cholesterol: 234 mg/dL — ABNORMAL HIGH (ref <200)
HDL: 72 mg/dL (ref >=50)
LDL Cholesterol (Calc): 149 mg/dL (calc) — ABNORMAL HIGH
Non-HDL Cholesterol (Calc): 162 mg/dL (calc) — ABNORMAL HIGH (ref <130)
Total CHOL/HDL Ratio: 3.3 (calc) (ref <5.0)
Triglycerides: 44 mg/dL (ref <150)

## 2018-06-23 LAB — TSH: TSH: 1.5 mIU/L (ref 0.40–4.50)

## 2018-06-28 DIAGNOSIS — M25512 Pain in left shoulder: Secondary | ICD-10-CM | POA: Diagnosis not present

## 2018-06-28 DIAGNOSIS — M7542 Impingement syndrome of left shoulder: Secondary | ICD-10-CM | POA: Diagnosis not present

## 2018-07-01 ENCOUNTER — Encounter: Payer: Self-pay | Admitting: Internal Medicine

## 2018-07-01 ENCOUNTER — Ambulatory Visit (INDEPENDENT_AMBULATORY_CARE_PROVIDER_SITE_OTHER): Payer: Medicare HMO | Admitting: Internal Medicine

## 2018-07-01 VITALS — BP 110/70 | HR 74 | Ht 67.0 in | Wt 158.0 lb

## 2018-07-01 DIAGNOSIS — Z79890 Hormone replacement therapy (postmenopausal): Secondary | ICD-10-CM | POA: Diagnosis not present

## 2018-07-01 DIAGNOSIS — R69 Illness, unspecified: Secondary | ICD-10-CM | POA: Diagnosis not present

## 2018-07-01 DIAGNOSIS — F1021 Alcohol dependence, in remission: Secondary | ICD-10-CM | POA: Diagnosis not present

## 2018-07-01 DIAGNOSIS — Z86010 Personal history of colonic polyps: Secondary | ICD-10-CM | POA: Diagnosis not present

## 2018-07-01 DIAGNOSIS — Z5181 Encounter for therapeutic drug level monitoring: Secondary | ICD-10-CM

## 2018-07-01 DIAGNOSIS — K219 Gastro-esophageal reflux disease without esophagitis: Secondary | ICD-10-CM | POA: Diagnosis not present

## 2018-07-01 DIAGNOSIS — M25512 Pain in left shoulder: Secondary | ICD-10-CM | POA: Diagnosis not present

## 2018-07-01 DIAGNOSIS — E7800 Pure hypercholesterolemia, unspecified: Secondary | ICD-10-CM

## 2018-07-01 DIAGNOSIS — Z0000 Encounter for general adult medical examination without abnormal findings: Secondary | ICD-10-CM

## 2018-07-01 DIAGNOSIS — Z800 Family history of malignant neoplasm of digestive organs: Secondary | ICD-10-CM | POA: Diagnosis not present

## 2018-07-01 DIAGNOSIS — G8929 Other chronic pain: Secondary | ICD-10-CM | POA: Diagnosis not present

## 2018-07-01 DIAGNOSIS — Z860101 Personal history of adenomatous and serrated colon polyps: Secondary | ICD-10-CM

## 2018-07-01 LAB — POCT URINALYSIS DIPSTICK
Appearance: NEGATIVE
Bilirubin, UA: NEGATIVE
Blood, UA: NEGATIVE
Glucose, UA: NEGATIVE
Ketones, UA: NEGATIVE
Leukocytes, UA: NEGATIVE
Nitrite, UA: NEGATIVE
Odor: NEGATIVE
Protein, UA: NEGATIVE
Spec Grav, UA: 1.015 (ref 1.010–1.025)
Urobilinogen, UA: 0.2 E.U./dL
pH, UA: 6.5 (ref 5.0–8.0)

## 2018-07-01 MED ORDER — LISINOPRIL 5 MG PO TABS: ORAL_TABLET | ORAL | 0 refills | Status: DC
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
## 2018-07-01 NOTE — Patient Instructions (Addendum)
It was a pleasure to see you today.  Continue current medications and follow-up in 6 months.  Watch diet.  Does not want to be on statin therapy.  Will need lipid panel without liver functions upon return in 6 months.  Declined tetanus immunization update today.  Says she will return at another time for tetanus immunization.

## 2018-07-04 ENCOUNTER — Institutional Professional Consult (permissible substitution): Payer: Medicare HMO | Admitting: Emergency Medicine

## 2018-07-04 ENCOUNTER — Other Ambulatory Visit: Payer: Self-pay | Admitting: Neurosurgery

## 2018-07-04 DIAGNOSIS — M5412 Radiculopathy, cervical region: Secondary | ICD-10-CM

## 2018-07-05 DIAGNOSIS — L570 Actinic keratosis: Secondary | ICD-10-CM | POA: Diagnosis not present

## 2018-07-05 DIAGNOSIS — D229 Melanocytic nevi, unspecified: Secondary | ICD-10-CM | POA: Diagnosis not present

## 2018-07-10 ENCOUNTER — Ambulatory Visit
Admission: RE | Admit: 2018-07-10 | Discharge: 2018-07-10 | Disposition: A | Discharge status: Home / Self Care | Payer: Medicare HMO | Source: Ambulatory Visit | Attending: Neurosurgery | Admitting: Neurosurgery

## 2018-07-10 DIAGNOSIS — M542 Cervicalgia: Secondary | ICD-10-CM | POA: Diagnosis not present

## 2018-07-10 DIAGNOSIS — M5412 Radiculopathy, cervical region: Secondary | ICD-10-CM

## 2018-07-12 ENCOUNTER — Ambulatory Visit (INDEPENDENT_AMBULATORY_CARE_PROVIDER_SITE_OTHER): Payer: Medicare HMO | Admitting: Internal Medicine

## 2018-07-12 ENCOUNTER — Encounter: Payer: Self-pay | Admitting: Internal Medicine

## 2018-07-12 VITALS — BP 110/70 | HR 80 | Ht 67.0 in | Wt 160.0 lb

## 2018-07-12 DIAGNOSIS — R0602 Shortness of breath: Secondary | ICD-10-CM

## 2018-07-12 DIAGNOSIS — G250 Essential tremor: Secondary | ICD-10-CM

## 2018-07-12 DIAGNOSIS — F1021 Alcohol dependence, in remission: Secondary | ICD-10-CM

## 2018-07-12 DIAGNOSIS — R079 Chest pain, unspecified: Secondary | ICD-10-CM | POA: Diagnosis not present

## 2018-07-12 DIAGNOSIS — R69 Illness, unspecified: Secondary | ICD-10-CM | POA: Diagnosis not present

## 2018-07-12 DIAGNOSIS — F411 Generalized anxiety disorder: Secondary | ICD-10-CM

## 2018-07-12 DIAGNOSIS — Z23 Encounter for immunization: Secondary | ICD-10-CM

## 2018-07-12 NOTE — Patient Instructions (Signed)
Continue propranolol 10 mg daily.  Referral will be made to [facility] and [facility] at patient request

## 2018-07-12 NOTE — Progress Notes (Signed)
New Patient Office Visit  Subjective:  Patient ID: Donna [name], female    DOB: 1951-01-14  Age: 68 y.o. MRN: [id number]  CC:  Chief Complaint  Patient presents with  . LEFT ARM TREMBLING    SOB with exertion, leg cramps. Pt would like referral to CARD.  Marland Kitchen Chest Pain    HPI Donna [name] presents for tremor land hand and chest pain with walking uphill.  Longstanding history of left hand tremor.  Is on Inderal 10 mg daily.  Says some days the tremor is worse than others.  She is worried about Parkinson's disease.  She has a history of anxiety and is an alcoholic in recovery.  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  She has lots of concerns about her health and worries a lot.  Says she cannot get these things out of her head.  Not sure if Parkinson's runs in her family because she has a poor relationship with her family.  Wants to be evaluated for Parkinson's disease and for chest pain.  She saw Cardiologist in 2014 and had negative myocardial perfusion study.  She has complained of chest pain walking uphill without significant radiation and some mild shortness of breath.  Recently saw Dr. Melvyn Novas regarding upper airway cough syndrome.  Chest x-ray revealed no active pulmonary disease.  Pulse oximetry 98% on room air.  Past Medical History:  Diagnosis Date  . Allergy   . Anxiety   . Arthritis   . Cancer ([place])   . Cataract   . Depression   . Diverticula of colon   . GERD (gastroesophageal reflux disease)   . Heart murmur   . Hiatal hernia   . Hyperlipidemia   . Hypertension   . IBS (irritable bowel syndrome)   . Substance abuse [facility])     Past Surgical History:  Procedure Laterality Date  . ANKLE SURGERY     Bilateral   . CATARACT EXTRACTION Bilateral   . TUBAL LIGATION      Family History  Problem Relation Age of Onset  . Colon cancer Father 34       Survived  . Heart disease Father   . Heart attack Father   . Diabetes Mother   . Colon cancer Paternal  Grandmother   . Colon polyps Paternal Aunt   . Pancreatic cancer Paternal Aunt   . Stomach cancer Neg Hx   . Esophageal cancer Neg Hx   . Liver cancer Neg Hx     Social History   Socioeconomic History  . Marital status: Married    Spouse name: Not on file  . Number of children: 0  . Years of education: Not on file  . Highest education level: Not on file  Occupational History  . Occupation: [employer]  Social Needs  . Financial resource strain: Not on file  . Food insecurity:    Worry: Not on file    Inability: Not on file  . Transportation needs:    Medical: Not on file    Non-medical: Not on file  Tobacco Use  . Smoking status: Former Smoker    Years: 30.00    Types: Cigarettes    Last attempt to quit: 10/15/1996    Years since quitting: 21.7  . Smokeless tobacco: Never Used  Substance and Sexual Activity  . Alcohol use: No    Alcohol/week: 0.0 standard drinks    Comment: former alcohol abuse/ 1 year ago  . Drug use:  No  . Sexual activity: Yes  Lifestyle  . Physical activity:    Days per week: Not on file    Minutes per session: Not on file  . Stress: Not on file  Relationships  . Social connections:    Talks on phone: Not on file    Gets together: Not on file    Attends religious service: Not on file    Active member of club or organization: Not on file    Attends meetings of clubs or organizations: Not on file    Relationship status: Not on file  . Intimate partner violence:    Fear of current or ex partner: Not on file    Emotionally abused: Not on file    Physically abused: Not on file    Forced sexual activity: Not on file  Other Topics Concern  . Not on file  Social History Narrative   2 caffeine drinks daily          ROS Review of Systems no radiation of chest pain. No nausea or diaphoresis. Has anxiety about her health. Not known if family hx of   Objective:   Today's Vitals: BP 110/70   Pulse 80   Ht 5\' 7"  (1.702 m)   Wt 160 lb (72.6 kg)    SpO2 98%   BMI 25.06 kg/m   Physical Exam Vitals signs reviewed.  Constitutional:      Appearance: She is well-developed.  HENT:     Head: Normocephalic and atraumatic.  Neck:     Musculoskeletal: Neck supple.     Thyroid: No thyromegaly.     Vascular: No JVD.  Cardiovascular:     Rate and Rhythm: Normal rate and regular rhythm.     Heart sounds: Normal heart sounds. No murmur.  Pulmonary:     Effort: Pulmonary effort is normal. No tachypnea or respiratory distress.     Breath sounds: Normal breath sounds.  Musculoskeletal:     Right lower leg: No edema.     Left lower leg: No edema.  Skin:    General: Skin is warm and dry.  Neurological:     Mental Status: She is alert.     Comments: She has fine tremor on the left hand.  No cogwheel rigidity.  Brief neuro exam is intact      Assessment & Plan:  Essential tremor-patient wants to make sure she does not have Parkinson's disease.  She does have beta-blocker to take for this.  Anxiety state-worried about her health  Chest pain with exertion going up hill.  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  She had work-up by Dr. Olive Bass in 2014 including normal myocardial perfusion study.  She wants to see cardiologist once again.  EKG is essentially unchanged from EKG in 2014 except for borderline LVH. Problem List Items Addressed This Visit    None    Visit Diagnoses    Need for Tdap vaccination    -  Primary   Relevant Orders   Tdap vaccine greater than or equal to 7yo IM      Outpatient Encounter Medications as of 07/12/2018  Medication Sig  . b complex vitamins tablet Take 1 tablet by mouth daily.    . calcium gluconate 500 MG tablet Take 500 mg by mouth daily.    Marland Kitchen lisinopril (PRINIVIL,ZESTRIL) 5 MG tablet One po daily for elevated BP while traveling  . Magnesium 500 MG TABS Take 1 tablet by mouth  daily.    . Multiple Vitamin (MULTIVITAMIN) capsule Take 1 capsule by mouth daily.    . propranolol  (INDERAL) 10 MG tablet TAKE 1 TABLET(10 MG) BY MOUTH THREE TIMES DAILY  . valACYclovir (VALTREX) 500 MG tablet TAKE 1 TABLET BY MOUTH TWICE DAILY  . [DISCONTINUED] amitriptyline (ELAVIL) 25 MG tablet Take 1 tablet (25 mg total) by mouth at bedtime.  . [DISCONTINUED] famotidine (PEPCID) 20 MG tablet One at bedtime  . [DISCONTINUED] pantoprazole (PROTONIX) 40 MG tablet Take 1 tablet (40 mg total) by mouth daily. Take 30-60 min before first meal of the day   No facility-administered encounter medications on file as of 07/12/2018.    Return as needed.  Referral to 1 of our neurology and [facility] cardiology  Elby Showers, MD

## 2018-07-14 ENCOUNTER — Encounter: Payer: Self-pay | Admitting: Internal Medicine

## 2018-07-18 ENCOUNTER — Ambulatory Visit: Payer: Medicare HMO | Admitting: Internal Medicine

## 2018-07-18 ENCOUNTER — Encounter: Payer: Self-pay | Admitting: Neurology

## 2018-07-18 DIAGNOSIS — M5412 Radiculopathy, cervical region: Secondary | ICD-10-CM | POA: Diagnosis not present

## 2018-07-18 DIAGNOSIS — M50222 Other cervical disc displacement at C5-C6 level: Secondary | ICD-10-CM | POA: Diagnosis not present

## 2018-07-18 DIAGNOSIS — M542 Cervicalgia: Secondary | ICD-10-CM | POA: Diagnosis not present

## 2018-07-20 ENCOUNTER — Other Ambulatory Visit: Payer: Self-pay | Admitting: Internal Medicine

## 2018-07-23 ENCOUNTER — Other Ambulatory Visit: Payer: Self-pay | Admitting: Internal Medicine

## 2018-07-28 ENCOUNTER — Ambulatory Visit: Payer: Medicare HMO | Admitting: Internal Medicine

## 2018-07-29 ENCOUNTER — Encounter: Payer: Self-pay | Admitting: Internal Medicine

## 2018-07-29 ENCOUNTER — Ambulatory Visit (INDEPENDENT_AMBULATORY_CARE_PROVIDER_SITE_OTHER): Payer: Medicare HMO | Admitting: Internal Medicine

## 2018-07-29 ENCOUNTER — Other Ambulatory Visit: Payer: Self-pay

## 2018-07-29 VITALS — BP 120/80 | HR 67 | Temp 98.5°F | Ht 67.0 in | Wt 160.0 lb

## 2018-07-29 DIAGNOSIS — F1021 Alcohol dependence, in remission: Secondary | ICD-10-CM | POA: Diagnosis not present

## 2018-07-29 DIAGNOSIS — G250 Essential tremor: Secondary | ICD-10-CM

## 2018-07-29 DIAGNOSIS — G529 Cranial nerve disorder, unspecified: Secondary | ICD-10-CM

## 2018-07-29 DIAGNOSIS — R69 Illness, unspecified: Secondary | ICD-10-CM | POA: Diagnosis not present

## 2018-07-29 DIAGNOSIS — R079 Chest pain, unspecified: Secondary | ICD-10-CM

## 2018-07-29 DIAGNOSIS — F411 Generalized anxiety disorder: Secondary | ICD-10-CM

## 2018-07-29 MED ORDER — CHLORDIAZEPOXIDE HCL 10 MG PO CAPS: 10.0000 mg | ORAL_CAPSULE | Freq: Three times a day (TID) | ORAL | 0 refills | Status: DC | PRN

## 2018-07-29 NOTE — Progress Notes (Signed)
   Subjective:    Patient ID: Donna [name], female    DOB: Sep 29, 1950, 68 y.o.   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  Says that is not a true headache but there is a sharp pain in her left parietal area that comes and goes and is frightening to her.  She has a history of anxiety.  She is an alcoholic in recovery.  Since the outbreak of COVID-19, her yoga studio has closed and she has been at home.  Says she is not drinking alcohol.  She does have a history of intermittent elevation of her blood pressure at times particularly on trips to higher elevations.    Review of Systems no nausea or vomiting.  She is complaining of tremor and is concerned about that as well.  No significant shortness of breath.  She is in excellent physical condition due to yoga.     Objective:   Physical Exam Blood pressure 120/80, pulse 67, temperature 98.5 degrees pulse oximetry 98%.  She has a resting tremor that appears to be a benign type tremor.  Cranial nerves II through XII are grossly intact.  PERRLA.  Funduscopic exam not done.  No focal deficits on brief neurological exam.  Her muscle strength is normal and her deep tendon reflexes are normal in the upper and lower extremities.  She jumps suddenly on the exam table intermittently and says that is  when the pain occurs in her left parietal region.  She appears to be anxious.  There is no lower extremity edema.  Cardiac exam regular rate and rhythm normal S1 and S2.  There is no JVD thyromegaly or carotid bruits.       Assessment & Plan:  Essential tremor-patient is worried about Parkinson's disease and would like to be referred to neurologist.  Referred to Dr. Carles Collet  Complaint of chest pain-this seems vague.  EKG today shows borderline LVH otherwise no acute change from EKG January 2014.  Anxiety state  Left parietal neuralgia-she has no focal deficits on brief neurological exam aside  from unilateral upper extremity tremor  History of labile blood pressure-has been prescribed lisinopril 5 mg daily but is currently not taking that  Plan: Patient would like to see Neurologist and Cardiologist regarding above complaints.  Referrals will be made.  She is previously taken Inderal 10 mg 3 times daily which was prescribed for alcohol withdrawal at [facility] in December 2017.  She continued to take propranolol but stopped it not long ago.  History of alcoholism currently in recovery denies drinking  Vague history of chest pain-may have borderline LVH and will be seen by Cardiologist  Patient can be prescribed antianxiety medication if necessary.  She can restart Inderal for tremor if necessary.  Says she feels better since she was evaluated will wait on specialty evaluations.  I recommended Librium 10 mg 3 times a day for anxiety but she declined

## 2018-08-02 ENCOUNTER — Ambulatory Visit: Payer: Medicare HMO | Admitting: Neurology

## 2018-08-06 NOTE — Patient Instructions (Addendum)
Referrals made to cardiology and neurology.  Medication offered for anxiety and for tremor but she declined.  EKG is within normal limits except for possible mild LVH.  Can restart Inderal for tremor.  Librium prescribed 10 mg 3 times a day for anxiety and neuralgia but patient declined.

## 2018-08-18 NOTE — Progress Notes (Signed)
Virtual Visit via Video Note The purpose of this virtual visit is to provide medical care while limiting exposure to the novel coronavirus.    Consent was obtained for video visit:  Yes.   Answered questions that patient had about telehealth interaction:  Yes.   I discussed the limitations, risks, security and privacy concerns of performing an evaluation and management service by telemedicine. I also discussed with the patient that there may be a patient responsible charge related to this service. The patient expressed understanding and agreed to proceed.  Pt location: Home Physician Location: home Name of referring provider:  Baxley, Cresenciano Lick, MD I connected with Donna [name] at patients initiation/request on 08/22/2018 at  9:00 AM EDT by video enabled telemedicine application and verified that I am speaking with the correct person using two identifiers. Pt MRN: [id number] Pt DOB: Apr 17, 1951 Video Participants: Donna [name];    Donna [name] was seen today in the movement disorders clinic for neurologic consultation at the request of Baxley, Cresenciano Lick, MD.  The consultation is for the evaluation of L hand tremor with a dx of essential tremor.  The records that were made available to me were reviewed.  Tremor: Yes.     How long has it been going on? For as long as she can remember - "I have several types of tremor."  At rest or with activation?  L arm mostly with use but occasionally with rest; prior to a year ago tremor was only in both thumbs and index fingers but over last year seemed to develop in the L  Fam hx of tremor?  Yes.  , younger brother, unknown dx  Located where? When down in yoga pose and has pressure on the L arm it will shake  Affected by caffeine:  Yes.   (2 cups coffee per day)  Affected by alcohol:  No. doesn't drink EtOH any longer.  Is a recovering alcoholic.  In recovery since 04/2016.  Affected by stress:  Yes.  , entire body, "inside and out and my  voice"  Affected by fatigue:  Yes.  , only if really tired  Spills soup if on spoon:  Could if nervous or in a crowd of people  Spills glass of liquid if full:  No.  Affects ADL's (tying shoes, brushing teeth, etc):  No.  Tremor inducing meds:  No.   -has previously been given inderal LA, 10 mg, tid for the tremor and states that she only ever took it as needed.  She takes it 1-3 times per week still   Other Specific Symptoms:  Voice: no change Sleep: sleeps fairly well  Vivid Dreams:  No.  Acting out dreams:  No., used to but hasn't done that in 7-8 years Wet Pillows: No. Postural symptoms:  Yes.  , more trouble with one leg yoga than in the past and more trouble with tandem walking   Falls?  No. (none in last few years, but did few years ago when trying lexapro) Bradykinesia symptoms: shuffling gait and slow movements (thought that she shuffled the R foot a year ago but became aware of that and thinks that she self corrected that) Loss of smell:  No. - "its really great" Loss of taste:  No. Urinary Incontinence:  No. Difficulty Swallowing:  No. Handwriting, micrographia: No. Trouble with ADL's:  No.  Trouble buttoning clothing: No. Depression:  No. (had it several years ago but better now) Memory changes:  No. (perhaps mild  short term memory trouble with names) Hallucinations:  No.  visual distortions: Yes.   N/V:  No. Lightheaded:  No.  Syncope: No. Diplopia:  No. Dyskinesia:  No.  Neuroimaging of the brain has  previously been performed.  Last MRI brain was in 2011 for [facility] at the time.  It was normal.  I personally reviewed that.    PREVIOUS MEDICATIONS: inderal   ALLERGIES:   Allergies  Allergen Reactions  . Ciprofloxacin Other (See Comments)    Neuropathy   . Doxycycline Nausea Only  . Gluten Meal     GI upset   . Penicillins Nausea And Vomiting    "breaking out"  . Quinolones Other (See Comments)    REACTION: sick / neuropathy     CURRENT MEDICATIONS:   Outpatient Encounter Medications as of 08/22/2018  Medication Sig  . b complex vitamins tablet Take 1 tablet by mouth daily.    . calcium gluconate 500 MG tablet Take 500 mg by mouth daily.    Marland Kitchen lisinopril (PRINIVIL,ZESTRIL) 5 MG tablet One po daily for elevated BP while traveling  . Magnesium 500 MG TABS Take 1 tablet by mouth daily.    . Multiple Vitamin (MULTIVITAMIN) capsule Take 1 capsule by mouth daily.    . propranolol (INDERAL) 10 MG tablet TAKE 1 TABLET(10 MG) BY MOUTH THREE TIMES DAILY (Patient taking differently: as needed. TAKE 1 TABLET(10 MG) BY MOUTH THREE TIMES DAILY)  . valACYclovir (VALTREX) 500 MG tablet TAKE 1 TABLET BY MOUTH TWICE A DAY (Patient taking differently: as needed. )   No facility-administered encounter medications on file as of 08/22/2018.     PAST MEDICAL HISTORY:   Past Medical History:  Diagnosis Date  . Allergy   . Anxiety   . Arthritis   . Cancer ([place])   . Cataract   . Depression   . Diverticula of colon   . GERD (gastroesophageal reflux disease)   . Heart murmur   . Hiatal hernia   . Hyperlipidemia   . Hypertension   . IBS (irritable bowel syndrome)   . Substance abuse ([place])     PAST SURGICAL HISTORY:   Past Surgical History:  Procedure Laterality Date  . ANKLE SURGERY     Bilateral   . CATARACT EXTRACTION Bilateral   . TUBAL LIGATION      SOCIAL HISTORY:   Social History   Socioeconomic History  . Marital status: Married    Spouse name: Not on file  . Number of children: 0  . Years of education: Not on file  . Highest education level: Not on file  Occupational History  . Occupation: [employer]  Social Needs  . Financial resource strain: Not on file  . Food insecurity:    Worry: Not on file    Inability: Not on file  . Transportation needs:    Medical: Not on file    Non-medical: Not on file  Tobacco Use  . Smoking status: Former Smoker    Years: 30.00    Types: Cigarettes    Last attempt to quit: 10/15/1996    Years  since quitting: 21.8  . Smokeless tobacco: Never Used  Substance and Sexual Activity  . Alcohol use: No    Alcohol/week: 0.0 standard drinks    Comment: former alcohol abuse/ 1 year ago  . Drug use: No  . Sexual activity: Yes  Lifestyle  . Physical activity:    Days per week: Not on file    Minutes per  session: Not on file  . Stress: Not on file  Relationships  . Social connections:    Talks on phone: Not on file    Gets together: Not on file    Attends religious service: Not on file    Active member of club or organization: Not on file    Attends meetings of clubs or organizations: Not on file    Relationship status: Not on file  . Intimate partner violence:    Fear of current or ex partner: Not on file    Emotionally abused: Not on file    Physically abused: Not on file    Forced sexual activity: Not on file  Other Topics Concern  . Not on file  Social History Narrative   2 caffeine drinks daily          FAMILY HISTORY:   Family Status  Relation Name Status  . Father  Alive  . Mother  Alive  . PGM  (Not Specified)  . Ethlyn Daniels  (Not Specified)  . Sister 1 Alive  . Brother 2 Alive  . Neg Hx  (Not Specified)    ROS:  Review of Systems  Constitutional: Negative.   HENT: Negative.   Eyes: Negative.   Respiratory: Negative.   Cardiovascular: Positive for chest pain (if going up a hill, not consistent, has cardiology appt in June).  Gastrointestinal: Negative.   Genitourinary: Negative.   Musculoskeletal: Positive for back pain and neck pain.  Skin: Negative.   Endo/Heme/Allergies: Negative.     PHYSICAL EXAMINATION:    VITALS:   Vitals:   08/22/18 0918  BP: (!) 167/94  Pulse: 67  Temp: (!) 96 F (35.6 C)    GEN:  The patient appears stated age and is in NAD. HEENT:  Normocephalic, atraumatic.  The mucous membranes are moist. The superficial temporal arteries are without ropiness or tenderness. CV:  RRR Lungs:  CTAB Neck/HEME:  There are no carotid  bruits bilaterally.  Neurological examination:  Orientation: The patient is alert and oriented x3. Fund of knowledge is appropriate.  Recent and remote memory are intact.  Attention and concentration are normal.    Able to name objects and repeat phrases. Cranial nerves: There is good facial symmetry. Extraocular muscles are intact. The visual fields are full to confrontational testing. The speech is fluent and clear.  Hearing is intact to conversational tone. Sensation: unable to do sensitive aspects d/t video visit Motor: Strength is at least antigravity x 4  There is no pronator drift.   Movement examination: Tone: unable Abnormal movements: mild postural tremor bilaterally.  No rest tremor noted even with distraction procedures Coordination:  There is just slight decrease in finger taps and heel taps on the right.  All other rapid alternating movements including hand opening and closing, alternation of supination/pronation of the forearms and toe taps are equal and symmetric bilaterally, without decremation. Gait and Station: The patient has no difficulty arising out of a deep-seated chair without the use of the hands. The patient's stride length is normal, with good arm swing.  She is able to ambulate in a tandem fashion.  She is able to stand in the Romberg position.  Lab Results  Component Value Date   TSH 1.50 06/23/2018     Chemistry      Component Value Date/Time   NA 134 (L) 06/23/2018 0931   K 4.5 06/23/2018 0931   CL 100 06/23/2018 0931   CO2 26 06/23/2018 0931  BUN 9 06/23/2018 0931   CREATININE 0.74 06/23/2018 0931      Component Value Date/Time   CALCIUM 9.3 06/23/2018 0931   ALKPHOS 50 01/04/2017 1114   AST 17 06/23/2018 0931   ALT 15 06/23/2018 0931   BILITOT 0.4 06/23/2018 0931       ASSESSMENT/PLAN:  1.  Tremor  -I suspect that this is longstanding essential tremor that has just gotten somewhat worse.  She has noticed tremor since childhood.  I really  did not see evidence of Parkinson's disease, with the exception of some mild decremation movements on the right (and she complains of more tremor on the left).  Obviously, examination is just somewhat limited by the fact that this was a video visit.  I did not notice any bradykinesia within the visit.  I would like to see her in the future in an in person visit.  -In the meantime, she and I discussed about various treatments.  She really is happy with using Inderal on an as-needed basis.  We talked about other treatment options, but she is happy with the degree of tremor control on Inderal.  2.  Hypertension  -Blood pressure was elevated today.  She had not taken her lisinopril (or the Inderal).  She reports that she only takes lisinopril on an as-needed basis.  Advised that perhaps today was a good day to take it.  She should be taking blood pressure frequently (and reports that she has been following it).  Follow Up Instructions:    -I discussed the assessment and treatment plan with the patient. The patient was provided an opportunity to ask questions and all were answered. The patient agreed with the plan and demonstrated an understanding of the instructions.   The patient was advised to call back or seek an in-person evaluation if the symptoms worsen or if the condition fails to improve as anticipated.     Cc:  Ludwig Clarks, DO

## 2018-08-22 ENCOUNTER — Encounter: Payer: Self-pay | Admitting: *Deleted

## 2018-08-22 ENCOUNTER — Other Ambulatory Visit: Payer: Self-pay

## 2018-08-22 ENCOUNTER — Encounter: Payer: Self-pay | Admitting: Neurology

## 2018-08-22 ENCOUNTER — Telehealth (INDEPENDENT_AMBULATORY_CARE_PROVIDER_SITE_OTHER): Payer: Medicare HMO | Admitting: Neurology

## 2018-08-22 DIAGNOSIS — I10 Essential (primary) hypertension: Secondary | ICD-10-CM | POA: Diagnosis not present

## 2018-08-22 DIAGNOSIS — G250 Essential tremor: Secondary | ICD-10-CM

## 2018-09-12 ENCOUNTER — Other Ambulatory Visit: Payer: Self-pay | Admitting: General Surgery

## 2018-09-12 DIAGNOSIS — R058 Other specified cough: Secondary | ICD-10-CM

## 2018-09-12 DIAGNOSIS — R05 Cough: Secondary | ICD-10-CM

## 2018-09-12 MED ORDER — PANTOPRAZOLE SODIUM 40 MG PO TBEC: 40.0000 mg | DELAYED_RELEASE_TABLET | Freq: Every day | ORAL | 3 refills | Status: DC

## 2018-09-19 DIAGNOSIS — M47812 Spondylosis without myelopathy or radiculopathy, cervical region: Secondary | ICD-10-CM | POA: Diagnosis not present

## 2018-09-20 ENCOUNTER — Other Ambulatory Visit: Payer: Self-pay | Admitting: Internal Medicine

## 2018-09-20 NOTE — Telephone Encounter (Signed)
Patient called states she takes it BID for 5 days during a breakout.

## 2018-09-20 NOTE — Telephone Encounter (Signed)
Left detailed message to call me back.

## 2018-09-20 NOTE — Telephone Encounter (Signed)
Call this in and clarify this is for outbreak not daily

## 2018-09-20 NOTE — Telephone Encounter (Signed)
This keeps coming up. How is pt taking this? ?Daily please call her

## 2018-10-06 ENCOUNTER — Telehealth: Payer: Self-pay

## 2018-10-06 DIAGNOSIS — M47812 Spondylosis without myelopathy or radiculopathy, cervical region: Secondary | ICD-10-CM | POA: Diagnosis not present

## 2018-10-06 NOTE — Telephone Encounter (Signed)
    COVID-19 Pre-Screening Questions:  . In the past 7 to 10 days have you had a cough,  shortness of breath, headache, congestion, fever (100 or greater) body aches, chills, sore throat, or sudden loss of taste or sense of smell? SOB with activity (walking up hill) . Have you been around anyone with known Covid 19. No . Have you been around anyone who is awaiting Covid 19 test results in the past 7 to 10 days? No . Have you been around anyone who has been exposed to Covid 19, or has mentioned symptoms of Covid 19 within the past 7 to 10 days? no  If you have any concerns/questions about symptoms patients report during screening (either on the phone or at threshold). Contact the provider seeing the patient or DOD for further guidance.  If neither are available contact a member of the leadership team.

## 2018-10-06 NOTE — Telephone Encounter (Signed)
Pt called back returning Pam's call. She said she prefers an in-person visit, but would like to ask Pam more questions to help make her final decision

## 2018-10-06 NOTE — Telephone Encounter (Signed)
Called patient back about next week's appt. Patient will call back to let us know if she will be coming into office or doing a virtual visit.

## 2018-10-07 NOTE — Progress Notes (Signed)
CARDIOLOGY CONSULT NOTE       Patient ID: Donna [name] MRN: [id number] DOB/AGE: 67-02-1951 67 y.o.  Admit date: (Not on file) Referring Physician: Baxley Primary Physician: Elby Showers, MD Primary Cardiologist: New/Kainat Pizana Reason for Consultation: Leg cramps, Dyspnea. Chest pain   Active Problems:   * No active hospital problems. *   HPI:  68 y.o. history of HTN and HLD. I saw her in 2014 for atypical chest pain She had normal ECG and normal nuclear study no infarct or ischemia EF 57% Previous ETOH abuse Rx [facility] At that time Rx inderal for withdrawal Was prescribed lisinopril 5 mg for BP but did not take. Has had essential tremor and saw Dr TAT 08/22/18 to r/o Parkinsons There is a family history of tremor Affects left arm mostly She declined Rx other than PRN inderal BP noted to be elevated but she indicated only taking inderal and lisinopril as needed Has cramps in legs they are not exertional my nightly like restless leg syndrome Still only taking ACE/Beta blocker as needed   ROS All other systems reviewed and negative except as noted above  Past Medical History:  Diagnosis Date  . Allergy   . Anxiety   . Arthritis   . Cancer ([place])   . Cataract   . Depression   . Diverticula of colon   . GERD (gastroesophageal reflux disease)   . Heart murmur   . Hiatal hernia   . Hyperlipidemia   . Hypertension   . IBS (irritable bowel syndrome)   . Substance abuse ([place])     Family History  Problem Relation Age of Onset  . Colon cancer Father 3       Survived  . Heart disease Father   . Heart attack Father   . Rheum arthritis Father   . Diabetes Mother   . Colon cancer Paternal Grandmother   . Colon polyps Paternal Aunt   . Pancreatic cancer Paternal Aunt   . Stomach cancer Neg Hx   . Esophageal cancer Neg Hx   . Liver cancer Neg Hx     Social History   Socioeconomic History  . Marital status: Married    Spouse name: Not on file  . Number of children:  0  . Years of education: Not on file  . Highest education level: Not on file  Occupational History  . Occupation: [employer]  Social Needs  . Financial resource strain: Not on file  . Food insecurity:    Worry: Not on file    Inability: Not on file  . Transportation needs:    Medical: Not on file    Non-medical: Not on file  Tobacco Use  . Smoking status: Former Smoker    Years: 30.00    Types: Cigarettes    Last attempt to quit: 10/15/1996    Years since quitting: 22.0  . Smokeless tobacco: Never Used  Substance and Sexual Activity  . Alcohol use: No    Alcohol/week: 0.0 standard drinks    Comment: former alcohol abuse/ 1 year ago  . Drug use: No  . Sexual activity: Yes  Lifestyle  . Physical activity:    Days per week: Not on file    Minutes per session: Not on file  . Stress: Not on file  Relationships  . Social connections:    Talks on phone: Not on file    Gets together: Not on file    Attends religious service: Not on file  Active member of club or organization: Not on file    Attends meetings of clubs or organizations: Not on file    Relationship status: Not on file  . Intimate partner violence:    Fear of current or ex partner: Not on file    Emotionally abused: Not on file    Physically abused: Not on file    Forced sexual activity: Not on file  Other Topics Concern  . Not on file  Social History Narrative   2 caffeine drinks daily          Past Surgical History:  Procedure Laterality Date  . ANKLE SURGERY     Bilateral   . CATARACT EXTRACTION Bilateral   . TUBAL LIGATION          Physical Exam: Blood pressure 120/62, pulse 71, height 5' 7.5" (1.715 m), weight 72.2 kg, SpO2 95 %.    Affect appropriate Healthy:  appears stated age 13: normal Neck supple with no adenopathy JVP normal no bruits no thyromegaly Lungs clear with no wheezing and good diaphragmatic motion Heart:  S1/S2 no murmur, no rub, gallop or click PMI normal Abdomen:  benighn, BS positve, no tenderness, no AAA no bruit.  No HSM or HJR Distal pulses intact with no bruits No edema Neuro non-focal UE tremors  Skin warm and dry No muscular weakness   Labs:   Lab Results  Component Value Date   WBC 3.9 06/23/2018   HGB 12.9 06/23/2018   HCT 37.5 06/23/2018   MCV 88.9 06/23/2018   PLT 308 06/23/2018   No results for input(s): NA, K, CL, CO2, BUN, CREATININE, CALCIUM, PROT, BILITOT, ALKPHOS, ALT, AST, GLUCOSE in the last 168 hours.  Invalid input(s): LABALBU No results found for: CKTOTAL, CKMB, CKMBINDEX, TROPONINI  Lab Results  Component Value Date   CHOL 234 (H) 06/23/2018   CHOL 205 (H) 12/17/2017   CHOL 261 (H) 05/18/2017   Lab Results  Component Value Date   HDL 72 06/23/2018   HDL 65 12/17/2017   HDL 81 05/18/2017   Lab Results  Component Value Date   LDLCALC 149 (H) 06/23/2018   LDLCALC 125 (H) 12/17/2017   LDLCALC 165 (H) 05/18/2017   Lab Results  Component Value Date   TRIG 44 06/23/2018   TRIG 49 12/17/2017   TRIG 57 05/18/2017   Lab Results  Component Value Date   CHOLHDL 3.3 06/23/2018   CHOLHDL 3.2 12/17/2017   CHOLHDL 3.2 05/18/2017   No results found for: LDLDIRECT    Radiology: No results found.  EKG: 07/12/18 SR rate 62 normal    ASSESSMENT AND PLAN:   1. Chest Pain: atypical normal ECG normal myovue 2014 stable observe  2. HTN:  Discussed taking inderal on regular basis for BP and tremor  F/U TAT  3. ETOH/Abuse:  Sober residual anxiety declined librium from primary  4. HLD:  F/u primary 5. Cramps: check ABI's but pulses seem reasonable on exam and symptoms non exertional f/u primary And check labs calcium level   Signed: Jenkins Rouge 10/12/2018, 4:30 PM

## 2018-10-12 ENCOUNTER — Ambulatory Visit (INDEPENDENT_AMBULATORY_CARE_PROVIDER_SITE_OTHER): Payer: Medicare HMO | Admitting: Cardiovascular Disease

## 2018-10-12 ENCOUNTER — Other Ambulatory Visit: Payer: Self-pay

## 2018-10-12 ENCOUNTER — Encounter: Payer: Self-pay | Admitting: Cardiovascular Disease

## 2018-10-12 VITALS — BP 120/62 | HR 71 | Ht 67.5 in | Wt 159.2 lb

## 2018-10-12 DIAGNOSIS — R079 Chest pain, unspecified: Secondary | ICD-10-CM | POA: Diagnosis not present

## 2018-10-12 DIAGNOSIS — I739 Peripheral vascular disease, unspecified: Secondary | ICD-10-CM | POA: Diagnosis not present

## 2018-10-12 NOTE — Patient Instructions (Addendum)
Medication Instructions:   If you need a refill on your cardiac medications before your next appointment, please call your pharmacy.   Lab work:  If you have labs (blood work) drawn today and your tests are completely normal, you will receive your results only by: Marland Kitchen MyChart Message (if you have MyChart) OR . A paper copy in the mail If you have any lab test that is abnormal or we need to change your treatment, we will call you to review the results.  Testing/Procedures: Your physician has requested that you have a lower extremity arterial  Duplex with ABI's.  Follow-Up: At [facility], you and your health needs are our priority.  As part of our continuing mission to provide you with exceptional heart care, we have created designated Provider Care Teams.  These Care Teams include your primary Cardiologist (physician) and Advanced Practice Providers (APPs -  Physician Assistants and Nurse Practitioners) who all work together to provide you with the care you need, when you need it. You will need a follow up appointment as needed with Dr. Johnsie Cancel.

## 2018-10-13 ENCOUNTER — Encounter: Payer: Self-pay | Admitting: Internal Medicine

## 2018-10-13 ENCOUNTER — Ambulatory Visit: Payer: Medicare HMO | Admitting: Internal Medicine

## 2018-10-13 DIAGNOSIS — I10 Essential (primary) hypertension: Secondary | ICD-10-CM | POA: Diagnosis not present

## 2018-10-13 DIAGNOSIS — R05 Cough: Secondary | ICD-10-CM

## 2018-10-13 DIAGNOSIS — R058 Other specified cough: Secondary | ICD-10-CM

## 2018-10-13 MED ORDER — FAMOTIDINE 20 MG PO TABS: ORAL_TABLET | ORAL | 11 refills | Status: DC

## 2018-10-13 MED ORDER — PANTOPRAZOLE SODIUM 40 MG PO TBEC: 40.0000 mg | DELAYED_RELEASE_TABLET | Freq: Every day | ORAL | 3 refills | Status: DC
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
## 2018-10-13 NOTE — Patient Instructions (Addendum)
To get the most out of exercise, you need to be continuously aware that you are short of breath, but never out of breath, for 30 minutes daily. As you improve, it will actually be easier for you to do the same amount of exercise  in  30 minutes so always push to the level where you are short of breath and check 02 saturations toward the end of the walk to see if trending down   Add pepcid 20 mg after supper or bedtime    Please schedule a follow up visit in 3 months but call sooner if needed  Add:  If not better rec permanently d/c acei, change inderol to bisoprolol and try off all HRT are next steps

## 2018-10-16 ENCOUNTER — Encounter: Payer: Self-pay | Admitting: Internal Medicine

## 2018-10-16 NOTE — Assessment & Plan Note (Addendum)
Onset 2017  - Esophagitis by EGD 03/05/10 and not on gerd rx chronically  -  Better with cough drops/ worse with cold air exp/ occ nocturnal episodes/ maint on low dose inderal and prn lisinopril  She is still on 3 medications that could relate to the cough but says "hardly ever take lisinopril"  And "probably doen't need HRT anyway"  - the inderal should also probably be converted to ideally to bisoprolol / reviewed    >>> for now rec add pepcid at hs and then consider the above changes.     F/u in 3 months

## 2018-10-16 NOTE — Assessment & Plan Note (Signed)
Add pepcid at hs.   Work on wt loss should help > calorie balance issues reviewed   I had an extended discussion with the patient reviewing all relevant studies completed to date and  lasting 15 to 20 minutes of a 25 minute visit    Each maintenance medication was reviewed in detail including most importantly the difference between maintenance and prns and under what circumstances the prns are to be triggered using an action plan format that is not reflected in the computer generated alphabetically organized AVS.     Please see AVS for specific instructions unique to this visit that I personally wrote and verbalized to the the pt in detail and then reviewed with pt  by my nurse highlighting any  changes in therapy recommended at today's visit to their plan of care.

## 2018-10-16 NOTE — Assessment & Plan Note (Signed)
See comments above re ACEi which she should stop indefinitely and Beta blockers =  In the setting of respiratory symptoms of unknown etiology,  It would be preferable to use bystolic, the most beta -1  selective Beta blocker available in sample form, with bisoprolol the most selective generic choice  on the market, at least on a trial basis, to make sure the spillover Beta 2 effects of the less specific Beta blockers are not contributing to this patient's symptoms.

## 2018-10-17 ENCOUNTER — Ambulatory Visit: Payer: Medicare HMO | Admitting: Neurology

## 2018-10-17 ENCOUNTER — Telehealth: Payer: Self-pay | Admitting: Internal Medicine

## 2018-10-17 NOTE — Telephone Encounter (Signed)
Pt has been made aware. Per problem list upper airway cough syndrome Notes recorded by Tanda Rockers, MD on 06/14/2018 at 9:01 AM EST Call pt: Reviewed cxr and no acute change (no evidence of any lung disease or pulmonary structural explanation for cough) so no change in recommendations made at ov Pt verbalized understanding Nothing further needed.

## 2018-10-24 ENCOUNTER — Encounter (HOSPITAL_COMMUNITY): Payer: Medicare HMO

## 2018-10-26 ENCOUNTER — Other Ambulatory Visit: Payer: Self-pay | Admitting: Cardiovascular Disease

## 2018-10-26 DIAGNOSIS — I739 Peripheral vascular disease, unspecified: Secondary | ICD-10-CM

## 2018-10-27 DIAGNOSIS — M47812 Spondylosis without myelopathy or radiculopathy, cervical region: Secondary | ICD-10-CM | POA: Diagnosis not present

## 2018-11-04 DIAGNOSIS — D229 Melanocytic nevi, unspecified: Secondary | ICD-10-CM | POA: Diagnosis not present

## 2018-11-04 DIAGNOSIS — L570 Actinic keratosis: Secondary | ICD-10-CM | POA: Diagnosis not present

## 2018-11-11 ENCOUNTER — Other Ambulatory Visit: Payer: Self-pay | Admitting: Internal Medicine

## 2018-11-14 DIAGNOSIS — M47812 Spondylosis without myelopathy or radiculopathy, cervical region: Secondary | ICD-10-CM | POA: Diagnosis not present

## 2018-11-15 ENCOUNTER — Other Ambulatory Visit: Payer: Self-pay

## 2018-11-15 ENCOUNTER — Ambulatory Visit (HOSPITAL_COMMUNITY)
Admission: RE | Admit: 2018-11-15 | Discharge: 2018-11-15 | Disposition: A | Discharge status: Home / Self Care | Payer: Medicare HMO | Source: Ambulatory Visit | Attending: Cardiology | Admitting: Cardiology

## 2018-11-15 ENCOUNTER — Other Ambulatory Visit: Payer: Self-pay | Admitting: Cardiovascular Disease

## 2018-11-15 DIAGNOSIS — I739 Peripheral vascular disease, unspecified: Secondary | ICD-10-CM | POA: Diagnosis not present

## 2018-12-06 ENCOUNTER — Encounter: Payer: Self-pay | Admitting: Internal Medicine

## 2018-12-06 DIAGNOSIS — Z803 Family history of malignant neoplasm of breast: Secondary | ICD-10-CM | POA: Diagnosis not present

## 2018-12-06 DIAGNOSIS — Z1231 Encounter for screening mammogram for malignant neoplasm of breast: Secondary | ICD-10-CM | POA: Diagnosis not present

## 2018-12-27 DIAGNOSIS — M47812 Spondylosis without myelopathy or radiculopathy, cervical region: Secondary | ICD-10-CM | POA: Diagnosis not present

## 2018-12-27 DIAGNOSIS — M5412 Radiculopathy, cervical region: Secondary | ICD-10-CM | POA: Diagnosis not present

## 2018-12-30 ENCOUNTER — Other Ambulatory Visit: Payer: Medicare HMO | Admitting: Internal Medicine

## 2018-12-30 ENCOUNTER — Other Ambulatory Visit: Payer: Self-pay

## 2018-12-30 DIAGNOSIS — E7800 Pure hypercholesterolemia, unspecified: Secondary | ICD-10-CM

## 2018-12-31 LAB — LIPID PANEL
Cholesterol: 252 mg/dL — ABNORMAL HIGH (ref <200)
HDL: 86 mg/dL (ref >=50)
LDL Cholesterol (Calc): 151 mg/dL (calc) — ABNORMAL HIGH
Non-HDL Cholesterol (Calc): 166 mg/dL (calc) — ABNORMAL HIGH (ref <130)
Total CHOL/HDL Ratio: 2.9 (calc) (ref <5.0)
Triglycerides: 48 mg/dL (ref <150)

## 2019-01-03 ENCOUNTER — Other Ambulatory Visit: Payer: Self-pay

## 2019-01-03 ENCOUNTER — Ambulatory Visit (INDEPENDENT_AMBULATORY_CARE_PROVIDER_SITE_OTHER): Payer: Medicare HMO | Admitting: Internal Medicine

## 2019-01-03 ENCOUNTER — Encounter: Payer: Self-pay | Admitting: Internal Medicine

## 2019-01-03 VITALS — BP 120/80 | HR 68 | Temp 98.3°F | Ht 67.0 in | Wt 156.0 lb

## 2019-01-03 DIAGNOSIS — E7800 Pure hypercholesterolemia, unspecified: Secondary | ICD-10-CM

## 2019-01-03 DIAGNOSIS — F1021 Alcohol dependence, in remission: Secondary | ICD-10-CM

## 2019-01-03 DIAGNOSIS — R69 Illness, unspecified: Secondary | ICD-10-CM | POA: Diagnosis not present

## 2019-01-03 DIAGNOSIS — G250 Essential tremor: Secondary | ICD-10-CM

## 2019-01-03 NOTE — Progress Notes (Signed)
   Subjective:    Patient ID: Donna [name], female    DOB: 1950-05-23, 68 y.o.   MRN: [id number]  HPI 68 year old Female for 6 month follow up on hyperlipidemia. Father died recently from complications of dementia. She went to funeral and wore a mask but many attendees did not have COVID-19 symptoms.  Blood pressure stable.  Does not take antihypertensive medication except once in a while when she is traveling as she has been elevated blood pressure on trips out of state.  Remains on Protonix 40 mg daily for GE reflux.  Has an essential tremor and takes propranolol 10 mg 3 times a day.  Is an alcoholic in recovery and currently doing well.  Gyms are still closed and yoga studios are included in that order.  Is teaching yoga virtually.  Not getting as much exercise.  Does not want to be on statin medication for hyperlipidemia.  Total cholesterol was 252, HDL 86, triglycerides 48 and LDL 151.  In February total cholesterol was 234, HDL 72, triglycerides 44 and LDL 149.  Her HDL has actually increased 14 points.  Review of Systems see above     Objective:   Physical Exam BP 120/80 pulse 68 T 98.3 Weight 156 pounds  She looks well.  Affect is normal.  Lipid panel discussed     Assessment & Plan:  Hyperlipidemia-does not want to be on lipid-lowering medication.  Follow-up in 6 months with routine health maintenance exam and fasting labs.  Alcoholism in recovery-doing well  Essential tremor treated with propranolol  Plan: Schedule physical exam in 6 months.  Watch diet and try to exercise more.

## 2019-01-03 NOTE — Patient Instructions (Signed)
Continue to watch diet and exercise. Pt wants Vitamin D level checked in 6 months at time of CPE. Pt does not want to be on statin medication. RTC in 6 months for annual wellness visit, Fasting labs and health maintenance exam.

## 2019-01-12 ENCOUNTER — Ambulatory Visit: Payer: Medicare HMO | Admitting: Sports Medicine

## 2019-01-12 ENCOUNTER — Other Ambulatory Visit: Payer: Self-pay

## 2019-01-12 VITALS — BP 132/86 | Ht 67.0 in | Wt 159.0 lb

## 2019-01-12 DIAGNOSIS — D229 Melanocytic nevi, unspecified: Secondary | ICD-10-CM | POA: Diagnosis not present

## 2019-01-12 DIAGNOSIS — M2021 Hallux rigidus, right foot: Secondary | ICD-10-CM

## 2019-01-12 DIAGNOSIS — M779 Enthesopathy, unspecified: Secondary | ICD-10-CM

## 2019-01-12 DIAGNOSIS — L820 Inflamed seborrheic keratosis: Secondary | ICD-10-CM | POA: Diagnosis not present

## 2019-01-12 DIAGNOSIS — L570 Actinic keratosis: Secondary | ICD-10-CM | POA: Diagnosis not present

## 2019-01-12 NOTE — Progress Notes (Signed)
  Donna [name] - 68 y.o. female MRN [id number]  Date of birth: [birth year]  SUBJECTIVE:   CC: right great toe pain  Donna [name] is a 68 yo female presenting with right 1st toe pain. She reports that the pain started acutely 2 weeks ago when she was walking. It was so bad that she almost called her husband to pick her up instead of walking home. She has noticed swelling in the toe and bruising. She has tried tumeric and ice over toe. Has not used any NSAIDs.  Active yoga instructor. Able to do yoga but some positions bother her toe.    She has been wearing green sports insoles with heel pads and scaphoid pads. Has history of heel pain, foot pain on that foot.  ROS: No unexpected weight loss, fever, chills, swelling, instability, muscle pain, numbness/tingling, redness, otherwise see HPI   PMHx - Updated and reviewed.  Contributory factors include: Negative PSHx - Updated and reviewed.  Contributory factors include:  Negative FHx - Updated and reviewed.  Contributory factors include:  Negative Social Hx - Updated and reviewed. Contributory factors include: Negative Medications - reviewed    PHYSICAL EXAM:  VS: BP:132/86  HR: bpm  TEMP: ( )  RESP:   HT:5\' 7"  (170.2 cm)   WT:159 lb (72.1 kg)  BMI:24.9 PHYSICAL EXAM: Gen: NAD, alert, cooperative with exam, well-appearing HEENT: clear conjunctiva,  CV:  no edema, capillary refill brisk, normal rate Resp: non-labored Skin: no rashes, normal turgor  Neuro: no gross deficits.  Psych:  alert and oriented  Right Foot: Inspection: 1st metatarsal insufficiency noted. Mild swelling with slight bruising over proximal 1st phalanx.  Transverse arch breakdown with bunion. Palpation: Tenderness to palpation of first toe at IP and proximal phalanx. No pain over MTP. ROM: Strength: Hallux rigidis of toe. Pain at PIP with flexion and extension of 1st toe.  Neurovascular: N/V intact distally in the lower extremity  US right 1st toe,  limited: 1st toe with hypoechoic changes in joint capsule at IP and hypoechoic changes at 1st MTP. Extensor hallucis longus tendon intact. Doppler with neovascularization over IP.  Impression: hallucis longus tendinopathy  ASSESSMENT & PLAN:  1st toe pain with exam and US findings consistent with hallucis longus tendinopathy with pain with resisted extension, swelling, as well as inflammation noted over 1st toe IP. Will trial rigid sole plate in right shoe to limit activity of 1st MTP. She may also try scheduled anti-inflammatory medications as well. Will return if pain does not improve.  Patient seen and evaluated with the sports medicine fellow.  I agree with the above plan of care.  Patient has inflammation along the dorsum of her right great toe.  It appears to be extensor tendinitis.  She also has a small effusion at the first MTP joint.  Treatment as above and follow-up for ongoing or recalcitrant issues.

## 2019-01-13 ENCOUNTER — Ambulatory Visit: Payer: Medicare HMO | Admitting: Internal Medicine

## 2019-01-13 ENCOUNTER — Encounter: Payer: Self-pay | Admitting: Sports Medicine

## 2019-01-13 ENCOUNTER — Other Ambulatory Visit: Payer: Self-pay | Admitting: Internal Medicine

## 2019-01-13 DIAGNOSIS — R058 Other specified cough: Secondary | ICD-10-CM

## 2019-01-13 DIAGNOSIS — R05 Cough: Secondary | ICD-10-CM

## 2019-01-18 ENCOUNTER — Encounter: Payer: Self-pay | Admitting: Internal Medicine

## 2019-01-18 ENCOUNTER — Other Ambulatory Visit: Payer: Self-pay

## 2019-01-18 ENCOUNTER — Ambulatory Visit (INDEPENDENT_AMBULATORY_CARE_PROVIDER_SITE_OTHER): Payer: Medicare HMO | Admitting: Internal Medicine

## 2019-01-18 DIAGNOSIS — R05 Cough: Secondary | ICD-10-CM | POA: Diagnosis not present

## 2019-01-18 DIAGNOSIS — R0600 Dyspnea, unspecified: Secondary | ICD-10-CM

## 2019-01-18 DIAGNOSIS — R0609 Other forms of dyspnea: Secondary | ICD-10-CM

## 2019-01-18 DIAGNOSIS — R059 Cough, unspecified: Secondary | ICD-10-CM

## 2019-01-18 DIAGNOSIS — R058 Other specified cough: Secondary | ICD-10-CM

## 2019-01-18 LAB — CBC WITH DIFFERENTIAL/PLATELET
Basophils Absolute: 0 10*3/uL (ref 0.0–0.1)
Basophils Relative: 0.5 % (ref 0.0–3.0)
Eosinophils Absolute: 0.1 10*3/uL (ref 0.0–0.7)
Eosinophils Relative: 2.4 % (ref 0.0–5.0)
HCT: 35 % — ABNORMAL LOW (ref 36.0–46.0)
Hemoglobin: 12 g/dL (ref 12.0–15.0)
Lymphocytes Relative: 29.9 % (ref 12.0–46.0)
Lymphs Abs: 1.7 10*3/uL (ref 0.7–4.0)
MCHC: 34.4 g/dL (ref 30.0–36.0)
MCV: 92.2 fl (ref 78.0–100.0)
Monocytes Absolute: 0.5 10*3/uL (ref 0.1–1.0)
Monocytes Relative: 8.8 % (ref 3.0–12.0)
Neutro Abs: 3.2 10*3/uL (ref 1.4–7.7)
Neutrophils Relative %: 58.4 % (ref 43.0–77.0)
Platelets: 296 10*3/uL (ref 150.0–400.0)
RBC: 3.79 Mil/uL — ABNORMAL LOW (ref 3.87–5.11)
RDW: 13.2 % (ref 11.5–15.5)
WBC: 5.5 10*3/uL (ref 4.0–10.5)
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
## 2019-01-18 NOTE — Patient Instructions (Addendum)
GERD (REFLUX)  is an extremely common cause of respiratory symptoms just like yours , many times with no obvious heartburn at all.    It can be treated with medication, but also with lifestyle changes including elevation of the head of your bed (ideally with 6 -8inch blocks under the headboard of your bed),  Smoking cessation, avoidance of late meals, excessive alcohol, and avoid fatty foods, chocolate, peppermint, colas, red wine, and acidic juices such as orange juice.  NO MINT OR MENTHOL PRODUCTS SO NO COUGH DROPS  USE SUGARLESS CANDY INSTEAD (Jolley ranchers or Stover's or Life Savers) or even ice chips will also do - the key is to swallow to prevent all throat clearing. NO OIL BASED VITAMINS - use powdered substitutes.  Avoid fish oil when coughing.   Protonix (pantoprazole) 40 mg Take 30-60 min before first meal of the day and pepcid 20 mg after supper    We will call you to schedule High Resolution CT of chest with results and recommendations going forward   Please remember to go to the lab department   for your tests - we will call you with the results when they are available.

## 2019-01-19 LAB — RESPIRATORY ALLERGY PROFILE REGION II ~~LOC~~

## 2019-01-19 LAB — INTERPRETATION:

## 2019-01-19 NOTE — Progress Notes (Signed)
mychart note sent

## 2019-01-20 ENCOUNTER — Encounter: Payer: Self-pay | Admitting: Internal Medicine

## 2019-01-20 DIAGNOSIS — R0600 Dyspnea, unspecified: Secondary | ICD-10-CM | POA: Insufficient documentation

## 2019-01-20 DIAGNOSIS — R0609 Other forms of dyspnea: Secondary | ICD-10-CM | POA: Insufficient documentation

## 2019-01-20 NOTE — Assessment & Plan Note (Addendum)
01/18/2019 describes assoc chest dicomfort ? Reflux or AP    Will resume max rx for gerd and proceed with HRCT   ? Needs cards eval if not improving but defer to Dr Renold Genta as has had similar cp's previously and under the care of [facility] for claudication.   I had an extended discussion with the patient reviewing all relevant studies completed to date and  lasting 15 to 20 minutes of a 25 minute visit    Each maintenance medication was reviewed in detail including most importantly the difference between maintenance and prns and under what circumstances the prns are to be triggered using an action plan format that is not reflected in the computer generated alphabetically organized AVS.     Please see AVS for specific instructions unique to this visit that I personally wrote and verbalized to the the pt in detail and then reviewed with pt  by my nurse highlighting any  changes in therapy recommended at today's visit to their plan of care.

## 2019-01-20 NOTE — Assessment & Plan Note (Addendum)
Onset 2017  - Esophagitis by EGD 03/05/10 and not on gerd rx chronically  -  Better with cough drops/ worse with cold air exp/ occ nocturnal episodes/ maint on low dose inderal and prn lisinopril - Allergy profile 01/18/2019  >  Eos 0.1 /  IgE  39  Says no inderal or lisinopril x months but still with cough and doe   rec restart max gerd rx and HRCT next step

## 2019-01-25 ENCOUNTER — Other Ambulatory Visit: Payer: Self-pay | Admitting: Internal Medicine

## 2019-01-25 DIAGNOSIS — R053 Chronic cough: Secondary | ICD-10-CM

## 2019-01-25 DIAGNOSIS — R05 Cough: Secondary | ICD-10-CM

## 2019-01-27 NOTE — Progress Notes (Signed)
Donna [name] was seen today in follow up for Essential tremor.  Pt is currently on Inderal LA, 10 mg, and only takes that on an as-needed basis.  Lately she has been taking, 2 po three times per week and it has been working.   Pt denies falls.  Pt denies lightheadedness, near syncope.  No hallucinations.  Mood has been good.  Medical records are reviewed since our last visit.  She did see Dr. Melvyn [name] for cough and he noted that the patient had not used her Inderal or lisinopril for months and still had the cough and dyspnea on exertion.  Treatments for reflux was recommended.  Pt doesn't think that it was reflux related and insisted on CT scan.  Pt states she was told that CT was denied by insurance and needs "breathing test" first.  Pt is nervous about this and is more shaky today.     PREVIOUS MEDICATIONS: Inderal LA, PRN  ALLERGIES:   Allergies  Allergen Reactions  . Ciprofloxacin Other (See Comments)    Neuropathy   . Doxycycline Nausea Only  . Gluten Meal     GI upset   . Penicillins Nausea And Vomiting    "breaking out"  . Quinolones Other (See Comments)    REACTION: sick / neuropathy     CURRENT MEDICATIONS:  Outpatient Encounter Medications as of 01/30/2019  Medication Sig  . b complex vitamins tablet Take 1 tablet by mouth daily.    . calcium gluconate 500 MG tablet Take 500 mg by mouth daily.    . Estradiol 10 MCG INST Place 1 application vaginally. Three times a week  . lisinopril (PRINIVIL,ZESTRIL) 5 MG tablet One po daily for elevated BP while traveling  . Magnesium 500 MG TABS Take 1 tablet by mouth daily.    . Multiple Vitamin (MULTIVITAMIN) capsule Take 1 capsule by mouth daily.    . progesterone (PROMETRIUM) 100 MG capsule Take 100 mg by mouth. Three times a week  . propranolol (INDERAL) 10 MG tablet TAKE 1 TABLET(10 MG) BY MOUTH THREE TIMES DAILY (Patient taking differently: as needed. TAKE 1 TABLET(10 MG) BY MOUTH THREE TIMES DAILY)  . valACYclovir (VALTREX) 500  MG tablet One po bid x 5 days for Herpes outbreak  . [DISCONTINUED] famotidine (PEPCID) 20 MG tablet One at bedtime (Patient not taking: Reported on 01/30/2019)  . [DISCONTINUED] pantoprazole (PROTONIX) 40 MG tablet TAKE 1 TABLET BY MOUTH DAILY. TAKE 30 - 60 MINUTES BEFORE FIRST MEAL OF THE DAY. (Patient not taking: Reported on 01/30/2019)   No facility-administered encounter medications on file as of 01/30/2019.     PAST MEDICAL HISTORY:   Past Medical History:  Diagnosis Date  . Allergy   . Anxiety   . Arthritis   . Cancer ([place])   . Cataract   . Depression   . Diverticula of colon   . GERD (gastroesophageal reflux disease)   . Heart murmur   . Hiatal hernia   . Hyperlipidemia   . Hypertension   . IBS (irritable bowel syndrome)   . Substance abuse ([place])     PAST SURGICAL HISTORY:   Past Surgical History:  Procedure Laterality Date  . ANKLE SURGERY     Bilateral   . CATARACT EXTRACTION Bilateral   . TUBAL LIGATION      SOCIAL HISTORY:   Social History   Socioeconomic History  . Marital status: Married    Spouse name: Not on file  . Number of children:  0  . Years of education: Not on file  . Highest education level: Associate degree: academic program  Occupational History  . Occupation: [employer]ER  Social Needs  . Financial resource strain: Not on file  . Food insecurity    Worry: Not on file    Inability: Not on file  . Transportation needs    Medical: Not on file    Non-medical: Not on file  Tobacco Use  . Smoking status: Former Smoker    Years: 30.00    Types: Cigarettes    Quit date: 10/15/1996    Years since quitting: 22.3  . Smokeless tobacco: Never Used  Substance and Sexual Activity  . Alcohol use: No    Alcohol/week: 0.0 standard drinks    Comment: former alcohol abuse/ 1 year ago  . Drug use: No  . Sexual activity: Yes  Lifestyle  . Physical activity    Days per week: Not on file    Minutes per session: Not on file  . Stress: Not on file   Relationships  . Social Herbalist on phone: Not on file    Gets together: Not on file    Attends religious service: Not on file    Active member of club or organization: Not on file    Attends meetings of clubs or organizations: Not on file    Relationship status: Not on file  . Intimate partner violence    Fear of current or ex partner: Not on file    Emotionally abused: Not on file    Physically abused: Not on file    Forced sexual activity: Not on file  Other Topics Concern  . Not on file  Social History Narrative   2 caffeine drinks daily          FAMILY HISTORY:   Family Status  Relation Name Status  . Father  Alive  . Mother  Alive  . PGM  (Not Specified)  . Donna [name]  (Not Specified)  . Sister 1 Alive  . Brother 2 Alive  . Neg Hx  (Not Specified)    ROS:  Review of Systems  Constitutional: Negative.   HENT: Negative.   Respiratory: Negative.   Cardiovascular: Negative.   Gastrointestinal: Negative.   Genitourinary: Negative.   Musculoskeletal: Negative.   Skin: Negative.     PHYSICAL EXAMINATION:    VITALS:   Vitals:   01/30/19 0833  BP: 137/83  Pulse: 67  SpO2: 94%  Weight: 158 lb 12.8 oz (72 kg)  Height: 5\' 7"  (1.702 m)    GEN:  The patient appears stated age and is in NAD. HEENT:  Normocephalic, atraumatic.  The mucous membranes are moist. The superficial temporal arteries are without ropiness or tenderness. CV:  RRR Lungs:  CTAB Neck/HEME:  There are no carotid bruits bilaterally.  Neurological examination:  Orientation: The patient is alert and oriented x3. Cranial nerves: There is good facial symmetry without facial hypomimia. The speech is fluent and clear. Soft palate rises symmetrically and there is no tongue deviation. Hearing is intact to conversational tone. Sensation: Sensation is intact to light touch throughout Motor: Strength is at least antigravity x4.  Movement examination: Tone: There is normal tone in the  UE/LE Abnormal movements: There is no rest tremor.  There is mild postural tremor.  There is really no tremor when given a weight.  She has no trouble with Archimedes spirals.  She is able to pour water from one  glass to another without significant trouble. Coordination:  There is no decremation with RAM's. Gait and Station: The patient has no difficulty arising out of a deep-seated chair without the use of the hands. The patient's stride length is good with good arm swing.      Chemistry      Component Value Date/Time   NA 134 (L) 06/23/2018 0931   K 4.5 06/23/2018 0931   CL 100 06/23/2018 0931   CO2 26 06/23/2018 0931   BUN 9 06/23/2018 0931   CREATININE 0.74 06/23/2018 0931      Component Value Date/Time   CALCIUM 9.3 06/23/2018 0931   ALKPHOS 50 01/04/2017 1114   AST 17 06/23/2018 0931   ALT 15 06/23/2018 0931   BILITOT 0.4 06/23/2018 0931     Lab Results  Component Value Date   TSH 1.50 06/23/2018      ASSESSMENT/PLAN:  1.  ET  -doing well with low dose inderal LA, 10 mg, prn.  She generally takes 2 tablets 3 times per week.  I think that this is reasonable.  She thinks it works really well.  She has no side effects.  I see no reason to change the medication.  She was worried about Parkinson's disease, and I reassured her that I saw no evidence of this today.  -I do not think that the Inderal was the source of the patient's cough.  She just does not use it often enough and has frequent cough that is worsening.  She has a breathing test coming up on Friday that she is very worried about.  She thought that the Inderal was going to interfere with that, but she was confusing Inderal with albuterol.  2.  Follow-up as needed.  Cc:  Elby Showers, MD

## 2019-01-30 ENCOUNTER — Telehealth: Payer: Self-pay | Admitting: Internal Medicine

## 2019-01-30 ENCOUNTER — Other Ambulatory Visit: Payer: Self-pay

## 2019-01-30 ENCOUNTER — Ambulatory Visit: Payer: Medicare HMO | Admitting: Neurology

## 2019-01-30 ENCOUNTER — Other Ambulatory Visit (HOSPITAL_COMMUNITY)
Admission: RE | Admit: 2019-01-30 | Discharge: 2019-01-30 | Disposition: A | Discharge status: Home / Self Care | Payer: Medicare HMO | Source: Ambulatory Visit | Attending: Internal Medicine | Admitting: Internal Medicine

## 2019-01-30 ENCOUNTER — Encounter: Payer: Self-pay | Admitting: Neurology

## 2019-01-30 VITALS — BP 137/83 | HR 67 | Ht 67.0 in | Wt 158.8 lb

## 2019-01-30 DIAGNOSIS — R053 Chronic cough: Secondary | ICD-10-CM

## 2019-01-30 DIAGNOSIS — R05 Cough: Secondary | ICD-10-CM

## 2019-01-30 DIAGNOSIS — G250 Essential tremor: Secondary | ICD-10-CM | POA: Diagnosis not present

## 2019-01-30 DIAGNOSIS — Z01812 Encounter for preprocedural laboratory examination: Secondary | ICD-10-CM | POA: Diagnosis not present

## 2019-01-30 DIAGNOSIS — Z20828 Contact with and (suspected) exposure to other viral communicable diseases: Secondary | ICD-10-CM | POA: Diagnosis not present

## 2019-01-30 NOTE — Patient Instructions (Signed)
The physicians and staff at [facility] are committed to providing excellent care. You may receive a survey requesting feedback about your experience at our office. We strive to receive "very good" responses to the survey questions. If you feel that your experience would prevent you from giving the office a "very good " response, please contact our office to try to remedy the situation. We may be reached at [phone]. Thank you for taking the time out of your busy day to complete the survey.  

## 2019-01-30 NOTE — Telephone Encounter (Signed)
LMTCB

## 2019-01-30 NOTE — Telephone Encounter (Signed)
They turned it down because they said for cough their policy is thatneeds to do MCT first, and I actually agree with this in terms of order of studies but attempted to skip this step due to covid when I  Recommended the HRCT(so I agree with the pt too).    We could resubmit the request for HRCT and plead the case again but there's no guarantee they'll agree.

## 2019-01-30 NOTE — Telephone Encounter (Signed)
Spoke with the pt  We ordered MCT bc her HRCT was not approved by her ins She states that she had to cancel the MCT due to family emergency and also b/c she is afraid to go to the hospital bc of covid  She states that she is confused bc she called her insurance company to ask why her ct chest was not approved and they advised her that they did not have any record of one not being approved   Will forward to MW as Juluis Rainier

## 2019-01-31 ENCOUNTER — Ambulatory Visit: Payer: Medicare HMO

## 2019-01-31 ENCOUNTER — Telehealth: Payer: Self-pay | Admitting: Internal Medicine

## 2019-01-31 NOTE — Telephone Encounter (Signed)
That's fine

## 2019-01-31 NOTE — Telephone Encounter (Signed)
You don't need to be close to an ER for the test but that's why we're doing it in the pulmonary lab and I've done [id number] with no complications ever as part of the standard cough work up so I think it's reasonable to do as next step as required by her insurance co

## 2019-01-31 NOTE — Telephone Encounter (Signed)
LVM to advise the patient based on her message and response from Dr. Melvyn Novas she will need to come in for additional tests before the test (HRCT) at hospital that she called about can be done.  Advised she will be contacted once it has been set up. But she can call back if she has any additional questions.  Message routed to [facility] for scheduling of required covid testing prior to the PFT which has already been ordered.

## 2019-01-31 NOTE — Telephone Encounter (Signed)
Dr. Melvyn Novas, please see [place] ( [facility]) response below. It looks like her last chest xray was 06/13/18. There is an order for a PFT but it does not appear it has been scheduled yet.   Did you want her to be scheduled for the PFT and have a chest xray same day in order to be compliant with insurance?

## 2019-01-31 NOTE — Telephone Encounter (Signed)
As I work on this precert I see pt has not had a recent cxr or pfts insurance is requiredin results of those can we get then set up and try to precert after results are available? Donna [name]

## 2019-01-31 NOTE — Telephone Encounter (Signed)
I will work on trying to re precert ct chest Donna [name]

## 2019-01-31 NOTE — Telephone Encounter (Signed)
I spoke with pt and advised her of MW message and she states she is ok with not doing the MCT. She would like [place] to resubmit the order. PCC's can we try to resubmit the HRCT?

## 2019-01-31 NOTE — Telephone Encounter (Signed)
See previous message. Will close this encounter.

## 2019-01-31 NOTE — Telephone Encounter (Signed)
Dr. Melvyn Novas,  I called the patient back and she wanted to know if she has to go through the Methacholine Challenge for the PFT. Has heard that it can be risky and it is recommended to be close to an emergency room when having it done.  Donna [name], the patient requested to be called prior to the scheduled PFT appointment. She works and needs to be able to get that time off.

## 2019-02-01 LAB — NOVEL CORONAVIRUS, NAA (HOSP ORDER, SEND-OUT TO REF LAB; TAT 18-24 HRS): SARS-CoV-2, NAA: NOT DETECTED

## 2019-02-01 NOTE — Telephone Encounter (Signed)
Methacholine challenge is arranged by PCC's will fwd to [facility] pool -pr

## 2019-02-01 NOTE — Telephone Encounter (Signed)
Sent message to dr wert he has ordered a cxr and pft on this pt before we try to re precert the chest ct Joellen Jersey

## 2019-02-02 ENCOUNTER — Telehealth: Payer: Self-pay

## 2019-02-02 NOTE — Telephone Encounter (Signed)
Error.  Previous message already closed.

## 2019-02-02 NOTE — Telephone Encounter (Signed)
On 9/17, Sherry left a message for [facility] to call Judeen Hammans to sched.

## 2019-02-03 ENCOUNTER — Encounter (HOSPITAL_COMMUNITY): Payer: Medicare HMO

## 2019-02-08 ENCOUNTER — Inpatient Hospital Stay: Admission: RE | Admit: 2019-02-08 | Payer: Medicare HMO | Source: Ambulatory Visit

## 2019-03-07 ENCOUNTER — Other Ambulatory Visit: Payer: Self-pay | Admitting: Physician Assistant

## 2019-03-07 DIAGNOSIS — D485 Neoplasm of uncertain behavior of skin: Secondary | ICD-10-CM | POA: Diagnosis not present

## 2019-03-07 DIAGNOSIS — L820 Inflamed seborrheic keratosis: Secondary | ICD-10-CM | POA: Diagnosis not present

## 2019-03-07 DIAGNOSIS — L570 Actinic keratosis: Secondary | ICD-10-CM | POA: Diagnosis not present

## 2019-03-10 ENCOUNTER — Other Ambulatory Visit (HOSPITAL_COMMUNITY)
Admission: RE | Admit: 2019-03-10 | Discharge: 2019-03-10 | Disposition: A | Discharge status: Home / Self Care | Payer: Medicare HMO | Source: Ambulatory Visit | Attending: Internal Medicine | Admitting: Internal Medicine

## 2019-03-10 DIAGNOSIS — Z20828 Contact with and (suspected) exposure to other viral communicable diseases: Secondary | ICD-10-CM | POA: Diagnosis not present

## 2019-03-10 DIAGNOSIS — Z01812 Encounter for preprocedural laboratory examination: Secondary | ICD-10-CM | POA: Diagnosis not present

## 2019-03-13 LAB — NOVEL CORONAVIRUS, NAA (HOSP ORDER, SEND-OUT TO REF LAB; TAT 18-24 HRS): SARS-CoV-2, NAA: NOT DETECTED

## 2019-03-13 NOTE — Progress Notes (Signed)
mychart result note sent

## 2019-03-15 ENCOUNTER — Other Ambulatory Visit: Payer: Self-pay

## 2019-03-15 ENCOUNTER — Ambulatory Visit (INDEPENDENT_AMBULATORY_CARE_PROVIDER_SITE_OTHER): Payer: Medicare HMO | Admitting: Internal Medicine

## 2019-03-15 ENCOUNTER — Ambulatory Visit (INDEPENDENT_AMBULATORY_CARE_PROVIDER_SITE_OTHER): Payer: Medicare HMO

## 2019-03-15 DIAGNOSIS — R053 Chronic cough: Secondary | ICD-10-CM

## 2019-03-15 DIAGNOSIS — R05 Cough: Secondary | ICD-10-CM

## 2019-03-15 LAB — PULMONARY FUNCTION TEST
DL/VA % pred: 147 %
DL/VA: 5.97 ml/min/mmHg/L
DLCO cor % pred: 122 %
DLCO cor: 27.59 ml/min/mmHg
DLCO unc % pred: 107 %
DLCO unc: 24.09 ml/min/mmHg
FEF 25-75 Post: 2.82 L/sec
FEF 25-75 Pre: 2.16 L/sec
FEF2575-%Change-Post: 30 %
FEF2575-%Pred-Post: 123 %
FEF2575-%Pred-Pre: 94 %
FEV1-%Change-Post: 5 %
FEV1-%Pred-Post: 89 %
FEV1-%Pred-Pre: 84 %
FEV1-Post: 2.46 L
FEV1-Pre: 2.33 L
FEV1FVC-%Change-Post: 5 %
FEV1FVC-%Pred-Pre: 102 %
FEV6-%Change-Post: 1 %
FEV6-%Pred-Post: 86 %
FEV6-%Pred-Pre: 85 %
FEV6-Post: 2.98 L
FEV6-Pre: 2.95 L
FEV6FVC-%Change-Post: 1 %
FEV6FVC-%Pred-Post: 104 %
FEV6FVC-%Pred-Pre: 103 %
FVC-%Change-Post: 0 %
FVC-%Pred-Post: 82 %
FVC-%Pred-Pre: 82 %
FVC-Post: 2.98 L
FVC-Pre: 2.98 L
Post FEV1/FVC ratio: 82 %
Post FEV6/FVC ratio: 100 %
Pre FEV1/FVC ratio: 78 %
Pre FEV6/FVC Ratio: 99 %
RV % pred: 89 %
RV: 2.08 L
TLC % pred: 93 %
TLC: 5.29 L

## 2019-03-15 NOTE — Progress Notes (Signed)
Full PFT performed today. °

## 2019-03-16 ENCOUNTER — Other Ambulatory Visit: Payer: Self-pay | Admitting: Internal Medicine

## 2019-03-16 DIAGNOSIS — R058 Other specified cough: Secondary | ICD-10-CM

## 2019-03-16 DIAGNOSIS — R05 Cough: Secondary | ICD-10-CM

## 2019-03-16 NOTE — Progress Notes (Signed)
Referral made 

## 2019-03-20 ENCOUNTER — Telehealth: Payer: Self-pay | Admitting: Internal Medicine

## 2019-03-20 NOTE — Telephone Encounter (Signed)
Noted. Nothing further needed at this time.  

## 2019-03-20 NOTE — Telephone Encounter (Signed)
Patient is aware that copy of her PFT was mailed today.

## 2019-03-20 NOTE — Telephone Encounter (Signed)
PFT printed and placed in outgoing mail to address on file. lmtcb X1 for pt to make aware.

## 2019-03-30 DIAGNOSIS — K219 Gastro-esophageal reflux disease without esophagitis: Secondary | ICD-10-CM | POA: Diagnosis not present

## 2019-03-30 DIAGNOSIS — J45991 Cough variant asthma: Secondary | ICD-10-CM | POA: Diagnosis not present

## 2019-03-30 DIAGNOSIS — J310 Chronic rhinitis: Secondary | ICD-10-CM | POA: Diagnosis not present

## 2019-04-10 ENCOUNTER — Other Ambulatory Visit: Payer: Self-pay | Admitting: Dermatology

## 2019-04-10 DIAGNOSIS — D229 Melanocytic nevi, unspecified: Secondary | ICD-10-CM | POA: Diagnosis not present

## 2019-04-10 DIAGNOSIS — L659 Nonscarring hair loss, unspecified: Secondary | ICD-10-CM | POA: Diagnosis not present

## 2019-04-10 DIAGNOSIS — L821 Other seborrheic keratosis: Secondary | ICD-10-CM | POA: Diagnosis not present

## 2019-04-10 DIAGNOSIS — L820 Inflamed seborrheic keratosis: Secondary | ICD-10-CM | POA: Diagnosis not present

## 2019-04-10 DIAGNOSIS — D485 Neoplasm of uncertain behavior of skin: Secondary | ICD-10-CM | POA: Diagnosis not present

## 2019-04-13 ENCOUNTER — Telehealth: Payer: Self-pay | Admitting: Internal Medicine

## 2019-04-13 MED ORDER — ESTRADIOL 10 MCG VA INST: 1.0000 "application " | VAGINAL_INSERT | VAGINAL | 11 refills | Status: DC

## 2019-04-13 NOTE — Telephone Encounter (Signed)
Received Fax RX request from  [place]  Medication - estradiol (ESTRACE) 1 MG tablet    Last Refill - 09/28/18  Last OV - 01/03/19  Last CPE - 07/01/18  Next Appointment - 07/04/2019

## 2019-06-27 ENCOUNTER — Other Ambulatory Visit: Payer: Medicare HMO | Admitting: Internal Medicine

## 2019-06-27 ENCOUNTER — Other Ambulatory Visit: Payer: Self-pay

## 2019-06-27 DIAGNOSIS — E7800 Pure hypercholesterolemia, unspecified: Secondary | ICD-10-CM

## 2019-06-27 DIAGNOSIS — Z800 Family history of malignant neoplasm of digestive organs: Secondary | ICD-10-CM

## 2019-06-27 DIAGNOSIS — Z0000 Encounter for general adult medical examination without abnormal findings: Secondary | ICD-10-CM

## 2019-06-27 DIAGNOSIS — G250 Essential tremor: Secondary | ICD-10-CM | POA: Diagnosis not present

## 2019-06-27 DIAGNOSIS — M25512 Pain in left shoulder: Secondary | ICD-10-CM | POA: Diagnosis not present

## 2019-06-27 DIAGNOSIS — G8929 Other chronic pain: Secondary | ICD-10-CM

## 2019-06-27 DIAGNOSIS — K219 Gastro-esophageal reflux disease without esophagitis: Secondary | ICD-10-CM | POA: Diagnosis not present

## 2019-06-27 DIAGNOSIS — R0602 Shortness of breath: Secondary | ICD-10-CM | POA: Diagnosis not present

## 2019-06-27 DIAGNOSIS — Z86010 Personal history of colonic polyps: Secondary | ICD-10-CM | POA: Diagnosis not present

## 2019-06-27 DIAGNOSIS — F1021 Alcohol dependence, in remission: Secondary | ICD-10-CM

## 2019-06-27 DIAGNOSIS — R69 Illness, unspecified: Secondary | ICD-10-CM | POA: Diagnosis not present

## 2019-06-27 MED ORDER — PROPRANOLOL HCL 10 MG PO TABS: ORAL_TABLET | ORAL | 0 refills | Status: DC

## 2019-06-27 NOTE — Addendum Note (Signed)
Addended by: Mady Haagensen on: 06/27/2019 10:15 AM   Modules accepted: Orders

## 2019-06-28 LAB — COMPLETE METABOLIC PANEL WITH GFR
AG Ratio: 2 (calc) (ref 1.0–2.5)
ALT: 17 U/L (ref 6–29)
AST: 20 U/L (ref 10–35)
Albumin: 4.3 g/dL (ref 3.6–5.1)
Alkaline phosphatase (APISO): 63 U/L (ref 37–153)
BUN: 10 mg/dL (ref 7–25)
CO2: 28 mmol/L (ref 20–32)
Calcium: 9.4 mg/dL (ref 8.6–10.4)
Chloride: 100 mmol/L (ref 98–110)
Creat: 0.66 mg/dL (ref 0.50–0.99)
GFR, Est African American: 106 mL/min/{1.73_m2} (ref >=60)
GFR, Est Non African American: 91 mL/min/{1.73_m2} (ref >=60)
Globulin: 2.2 g/dL (calc) (ref 1.9–3.7)
Glucose, Bld: 100 mg/dL — ABNORMAL HIGH (ref 65–99)
Potassium: 4.4 mmol/L (ref 3.5–5.3)
Sodium: 136 mmol/L (ref 135–146)
Total Bilirubin: 0.5 mg/dL (ref 0.2–1.2)
Total Protein: 6.5 g/dL (ref 6.1–8.1)

## 2019-06-28 LAB — LIPID PANEL
Cholesterol: 248 mg/dL — ABNORMAL HIGH (ref <200)
HDL: 95 mg/dL (ref >=50)
LDL Cholesterol (Calc): 140 mg/dL (calc) — ABNORMAL HIGH
Non-HDL Cholesterol (Calc): 153 mg/dL (calc) — ABNORMAL HIGH (ref <130)
Total CHOL/HDL Ratio: 2.6 (calc) (ref <5.0)
Triglycerides: 43 mg/dL (ref <150)

## 2019-06-28 LAB — TSH: TSH: 2.1 mIU/L (ref 0.40–4.50)

## 2019-06-28 LAB — CBC WITH DIFFERENTIAL/PLATELET
Absolute Monocytes: 466 cells/uL (ref 200–950)
Basophils Absolute: 37 cells/uL (ref 0–200)
Basophils Relative: 0.7 %
Eosinophils Absolute: 111 cells/uL (ref 15–500)
Eosinophils Relative: 2.1 %
HCT: 37.9 % (ref 35.0–45.0)
Hemoglobin: 12.9 g/dL (ref 11.7–15.5)
Lymphs Abs: 1309 cells/uL (ref 850–3900)
MCH: 30.1 pg (ref 27.0–33.0)
MCHC: 34 g/dL (ref 32.0–36.0)
MCV: 88.3 fL (ref 80.0–100.0)
MPV: 9.5 fL (ref 7.5–12.5)
Monocytes Relative: 8.8 %
Neutro Abs: 3376 cells/uL (ref 1500–7800)
Neutrophils Relative %: 63.7 %
Platelets: 290 10*3/uL (ref 140–400)
RBC: 4.29 10*6/uL (ref 3.80–5.10)
RDW: 12.8 % (ref 11.0–15.0)
Total Lymphocyte: 24.7 %
WBC: 5.3 10*3/uL (ref 3.8–10.8)

## 2019-06-28 LAB — RHEUMATOID FACTOR: Rheumatoid fact SerPl-aCnc: <14 IU/mL (ref <14)

## 2019-06-28 LAB — VITAMIN D 25 HYDROXY (VIT D DEFICIENCY, FRACTURES): Vit D, 25-Hydroxy: 37 ng/mL (ref 30–100)

## 2019-07-04 ENCOUNTER — Other Ambulatory Visit: Payer: Self-pay

## 2019-07-04 ENCOUNTER — Ambulatory Visit (INDEPENDENT_AMBULATORY_CARE_PROVIDER_SITE_OTHER): Payer: Medicare HMO | Admitting: Internal Medicine

## 2019-07-04 ENCOUNTER — Encounter: Payer: Self-pay | Admitting: Internal Medicine

## 2019-07-04 VITALS — BP 110/60 | HR 64 | Ht 67.0 in | Wt 160.0 lb

## 2019-07-04 DIAGNOSIS — E7800 Pure hypercholesterolemia, unspecified: Secondary | ICD-10-CM

## 2019-07-04 DIAGNOSIS — Z86010 Personal history of colonic polyps: Secondary | ICD-10-CM

## 2019-07-04 DIAGNOSIS — G250 Essential tremor: Secondary | ICD-10-CM | POA: Diagnosis not present

## 2019-07-04 DIAGNOSIS — Z860101 Personal history of adenomatous and serrated colon polyps: Secondary | ICD-10-CM

## 2019-07-04 DIAGNOSIS — Z800 Family history of malignant neoplasm of digestive organs: Secondary | ICD-10-CM

## 2019-07-04 DIAGNOSIS — R69 Illness, unspecified: Secondary | ICD-10-CM | POA: Diagnosis not present

## 2019-07-04 DIAGNOSIS — F411 Generalized anxiety disorder: Secondary | ICD-10-CM | POA: Diagnosis not present

## 2019-07-04 DIAGNOSIS — R0602 Shortness of breath: Secondary | ICD-10-CM | POA: Diagnosis not present

## 2019-07-04 DIAGNOSIS — F1021 Alcohol dependence, in remission: Secondary | ICD-10-CM | POA: Diagnosis not present

## 2019-07-04 DIAGNOSIS — Z0000 Encounter for general adult medical examination without abnormal findings: Secondary | ICD-10-CM

## 2019-07-04 LAB — POCT URINALYSIS DIPSTICK
Appearance: NEGATIVE
Bilirubin, UA: NEGATIVE
Blood, UA: NEGATIVE
Glucose, UA: NEGATIVE
Ketones, UA: NEGATIVE
Leukocytes, UA: NEGATIVE
Nitrite, UA: NEGATIVE
Odor: NEGATIVE
Protein, UA: NEGATIVE
Spec Grav, UA: 1.01 (ref 1.010–1.025)
Urobilinogen, UA: 0.2 E.U./dL
pH, UA: 6.5 (ref 5.0–8.0)

## 2019-07-04 MED ORDER — ROSUVASTATIN CALCIUM 5 MG PO TABS: ORAL_TABLET | ORAL | 1 refills | Status: DC
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
## 2019-07-05 NOTE — Patient Instructions (Addendum)
To have Cardiology evaluation for SOB with walking. Has had this complaint for sometime now. Continue current meds and RTC in 6 months or as needed

## 2019-07-09 ENCOUNTER — Ambulatory Visit: Payer: Medicare HMO | Attending: Internal Medicine

## 2019-07-09 DIAGNOSIS — Z23 Encounter for immunization: Secondary | ICD-10-CM | POA: Insufficient documentation

## 2019-07-09 NOTE — Progress Notes (Signed)
   Covid-19 Vaccination Clinic  Name:  Donna [name]    MRN: [id number] DOB: [birth year]  07/09/2019  [name] was observed post Covid-19 immunization for 30 minutes based on pre-vaccination screening without incidence. She was provided with Vaccine Information Sheet and instruction to access the V-Safe system.   [name] was instructed to call 911 with any severe reactions post vaccine: Marland Kitchen Difficulty breathing  . Swelling of your face and throat  . A fast heartbeat  . A bad rash all over your body  . Dizziness and weakness    Immunizations Administered    Name Date Dose VIS Date Route   Pfizer COVID-19 Vaccine 07/09/2019  8:18 AM 0.3 mL 04/21/2019 Intramuscular   Manufacturer: [place]   Lot: [id number]   [place]: [id number]

## 2019-08-02 ENCOUNTER — Ambulatory Visit: Payer: Medicare HMO | Attending: Internal Medicine

## 2019-08-02 DIAGNOSIS — Z23 Encounter for immunization: Secondary | ICD-10-CM

## 2019-08-02 NOTE — Progress Notes (Signed)
   Covid-19 Vaccination Clinic  Name:  Donna [name]    MRN: [id number] DOB: [birth year]  08/02/2019  Donna [name] was observed post Covid-19 immunization for 15 minutes without incident. She was provided with Vaccine Information Sheet and instruction to access the V-Safe system.   Donna [name] was instructed to call 911 with any severe reactions post vaccine: Marland Kitchen Difficulty breathing  . Swelling of face and throat  . A fast heartbeat  . A bad rash all over body  . Dizziness and weakness   Immunizations Administered    Name Date Dose VIS Date Route   Pfizer COVID-19 Vaccine 08/02/2019  8:17 AM 0.3 mL 04/21/2019 Intramuscular   Manufacturer: [place]   Lot: [id number]   [place]: [id number]

## 2019-08-08 ENCOUNTER — Other Ambulatory Visit: Payer: Self-pay

## 2019-08-08 ENCOUNTER — Ambulatory Visit: Payer: Medicare HMO | Admitting: Physician Assistant

## 2019-08-08 ENCOUNTER — Encounter: Payer: Self-pay | Admitting: Physician Assistant

## 2019-08-08 DIAGNOSIS — L570 Actinic keratosis: Secondary | ICD-10-CM | POA: Diagnosis not present

## 2019-08-08 DIAGNOSIS — D492 Neoplasm of unspecified behavior of bone, soft tissue, and skin: Secondary | ICD-10-CM

## 2019-08-08 DIAGNOSIS — D485 Neoplasm of uncertain behavior of skin: Secondary | ICD-10-CM

## 2019-08-08 DIAGNOSIS — L650 Telogen effluvium: Secondary | ICD-10-CM | POA: Diagnosis not present

## 2019-08-08 DIAGNOSIS — C44511 Basal cell carcinoma of skin of breast: Secondary | ICD-10-CM

## 2019-08-08 DIAGNOSIS — L659 Nonscarring hair loss, unspecified: Secondary | ICD-10-CM

## 2019-08-08 NOTE — Patient Instructions (Signed)

## 2019-08-08 NOTE — Progress Notes (Addendum)
Follow-Up Visit   Subjective  Donna [name] is a 69 y.o. female who presents for the following: Skin Problem (Possible recurrent lesions on scalp and chest.  Chest has some itching.  No bleeding or flaking.). Chest has history of numerous scc.  No treatment on the current ones. Persistent and growing.  Alopecia Location: scalp Duration: 6 mo Quality: diffuse loss Associated Signs/Symptoms: none Modifying Factors:  None Severity: getting better Timing: mother in law moved out Context: stress   The following portions of the chart were reviewed this encounter and updated as appropriate: Tobacco  Allergies  Meds  Problems  Med Hx  Surg Hx  Fam Hx      Objective  Well appearing patient in no apparent distress; mood and affect are within normal limits.  A full examination was performed including scalp, head, eyes, ears, nose, lips, neck, chest, axillae, abdomen, back, buttocks, bilateral upper extremities, bilateral lower extremities, hands, feet, fingers, toes, fingernails, and toenails. All findings within normal limits unless otherwise noted below.  Objective  Chest - Medial (Center), Head - Anterior (Face), Left Breast, Left Lower Leg - Anterior, Right Breast: Erythematous patches with gritty scale.  Objective  Scalp: All over thinning. Negative hair pull.  Objective  Right Breast: Pearly papule with telangectasia.      Objective  Left Breast: Hyperkeratotic scale with pink base      Assessment & Plan  AK (actinic keratosis) (5) Head - Anterior (Face); Left Lower Leg - Anterior; Chest - Medial [facility]); Left Breast; Right Breast  Destruction of lesion - Chest - Medial (Center), Head - Anterior (Face), Left Breast, Left Lower Leg - Anterior, Right Breast Complexity: simple   Destruction method: cryotherapy   Informed consent: discussed and consent obtained   Timeout:  patient name, date of birth, surgical site, and procedure verified Lesion destroyed  using liquid nitrogen: Yes   Cryotherapy cycles:  1 Outcome: patient tolerated procedure well with no complications   Post-procedure details: wound care instructions given    Alopecia telogen effluvium- Scalp  otc 5%minoxidil, hair and nail vitamin  Neoplasm of skin (2) Right Breast  Skin / nail biopsy Type of biopsy: tangential   Informed consent: discussed and consent obtained   Timeout: patient name, date of birth, surgical site, and procedure verified   Procedure prep:  Patient was prepped and draped in usual sterile fashion Prep type:  Chlorhexidine Anesthesia: the lesion was anesthetized in a standard fashion   Anesthetic:  1% lidocaine w/ epinephrine 1-100,000 local infiltration Instrument used: flexible razor blade   Hemostasis achieved with: aluminum chloride   Outcome: patient tolerated procedure well   Post-procedure details: wound care instructions given   Additional details:  PERFORMED ED&C AFTER BIOPSY TX p BX 1.1 cm  Specimen 1 - Surgical pathology Differential Diagnosis: bcc Check Margins: yes  Left Breast  Skin / nail biopsy Type of biopsy: tangential   Informed consent: discussed and consent obtained   Timeout: patient name, date of birth, surgical site, and procedure verified   Procedure prep:  Patient was prepped and draped in usual sterile fashion Prep type:  Chlorhexidine Anesthesia: the lesion was anesthetized in a standard fashion   Anesthetic:  1% lidocaine w/ epinephrine 1-100,000 local infiltration Instrument used: flexible razor blade   Hemostasis achieved with: aluminum chloride   Outcome: patient tolerated procedure well   Post-procedure details: wound care instructions given   Additional details:  PERFORMED ED&C AFTER BIOPSY TX p BX 1.0cm  Specimen 2 - Surgical pathology Differential Diagnosis: scc Check Margins: yes

## 2019-08-10 ENCOUNTER — Telehealth: Payer: Self-pay | Admitting: *Deleted

## 2019-08-10 NOTE — Telephone Encounter (Signed)
Pathology to patient- 30 min surgery appointment scheduled.

## 2019-08-10 NOTE — Telephone Encounter (Signed)
-----   Message from Warren Danes, [place] sent at 08/10/2019  4:23 PM EDT ----- 1. 30 surgery

## 2019-08-22 NOTE — Progress Notes (Signed)
CARDIOLOGY CONSULT NOTE       Patient ID: Donna [name] MRN: [id number] DOB/AGE: 68-May-1952 68 y.o.  Admit date: (Not on file) Referring Physician: [place] Primary Physician: Elby Showers, MD Primary Cardiologist: New/Jacquetta Polhamus Reason for Consultation: Leg cramps, Dyspnea. Chest pain   Active Problems:   * No active hospital problems. *   HPI:  69 y.o. history of HTN and HLD. I saw her in 2014 for atypical chest pain She had normal ECG and normal nuclear study no infarct or ischemia EF 57% Previous ETOH abuse Rx [facility] At that time Rx inderal for withdrawal Was prescribed lisinopril 5 mg for BP but did not take. Has had essential tremor and saw Dr TAT 08/22/18 to r/o Parkinsons There is a family history of tremor Affects left arm mostly She declined Rx other than PRN inderal BP noted to be elevated but she indicated only taking inderal and lisinopril as needed Has cramps in legs they are not exertional my nightly like restless leg syndrome   ABI"s done 11/15/18 were normal with no signs of PVD   She sees Dr Renold Genta for primary care. 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 Exertional dyspnea as well not related to wheezing/asthma  She has zoom meeting with counselor in [place] monthly Clean for 3 years 3 rd marriage going well but some Stress with both of them working from home   ROS All other systems reviewed and negative except as noted above  Past Medical History:  Diagnosis Date  . Anxiety   . Basal cell carcinoma 06/29/2017   right lip - CX3 + 5FU  . Cataract   . Depression   . GERD (gastroesophageal reflux disease)   . Heart murmur   . Hiatal hernia   . Hyperlipidemia   . IBS (irritable bowel syndrome)   . 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 Squamous cell carcinoma of skin  09/27/2014   left thigh - tx p bx  . Substance abuse ([place])     Family History  Problem Relation Age of Onset  . Colon cancer Father 47       Survived  . Heart disease Father   . Heart attack Father   . Rheum arthritis Father   . Diabetes Mother   . Colon cancer Paternal Grandmother   . Colon polyps Paternal Aunt   . Pancreatic cancer Paternal Aunt   . Healthy Sister   . Healthy Brother   . Stomach cancer Neg Hx   . Esophageal cancer Neg Hx   . Liver cancer Neg Hx     Social History   Socioeconomic History  . Marital status: Married    Spouse name: Not on file  . Number of children: 0  . Years of education: Not on file  . Highest education level: Associate degree: academic program  Occupational History  . Occupation: [employer]  Tobacco Use  . Smoking status: Former Smoker    Years: 30.00    Types: Cigarettes    Quit date: 10/15/1996    Years since quitting: 22.8  . Smokeless tobacco: Never Used  Substance and Sexual Activity  . Alcohol use: No    Alcohol/week: 0.0 standard drinks    Comment: former alcohol abuse/ 1 year ago  . Drug use: No  .  Sexual activity: Yes  Other Topics Concern  . Not on file  Social History Narrative   2 caffeine drinks daily         Social Determinants of Health   Financial Resource Strain:   . Difficulty of Paying Living Expenses:   Food Insecurity:   . Worried About Charity fundraiser in the Last Year:   . Arboriculturist in the Last Year:   Transportation Needs:   . Film/video editor (Medical):   Marland Kitchen Lack of Transportation (Non-Medical):   Physical Activity:   . Days of Exercise per Week:   . Minutes of Exercise per Session:   Stress:   . Feeling of Stress :   Social Connections:   . Frequency of Communication with Friends and Family:   . Frequency of Social Gatherings with Friends and Family:   . Attends Religious Services:   . Active Member of Clubs or Organizations:   . Attends Archivist Meetings:   Marland Kitchen  Marital Status:   Intimate Partner Violence:   . Fear of Current or Ex-Partner:   . Emotionally Abused:   Marland Kitchen Physically Abused:   . Sexually Abused:     Past Surgical History:  Procedure Laterality Date  . ANKLE SURGERY     Bilateral   . CATARACT EXTRACTION Bilateral   . TUBAL LIGATION        Physical Exam: Blood pressure 120/72, pulse (!) 58, height 5\' 8"  (1.727 m), weight 161 lb (73 kg), SpO2 98 %.    Affect appropriate Healthy:  appears stated age 37: normal Neck supple with no adenopathy JVP normal no bruits no thyromegaly Lungs clear with no wheezing and good diaphragmatic motion Heart:  S1/S2 no murmur, no rub, gallop or click PMI normal Abdomen: benighn, BS positve, no tenderness, no AAA no bruit.  No HSM or HJR Distal pulses intact with no bruits No edema Neuro non-focal UE tremors  Skin warm and dry No muscular weakness   Labs:   Lab Results  Component Value Date   WBC 5.3 06/27/2019   HGB 12.9 06/27/2019   HCT 37.9 06/27/2019   MCV 88.3 06/27/2019   PLT 290 06/27/2019   No results for input(s): NA, K, CL, CO2, BUN, CREATININE, CALCIUM, PROT, BILITOT, ALKPHOS, ALT, AST, GLUCOSE in the last 168 hours.  Invalid input(s): LABALBU No results found for: CKTOTAL, CKMB, CKMBINDEX, TROPONINI  Lab Results  Component Value Date   CHOL 248 (H) 06/27/2019   CHOL 252 (H) 12/30/2018   CHOL 234 (H) 06/23/2018   Lab Results  Component Value Date   HDL 95 06/27/2019   HDL 86 12/30/2018   HDL 72 06/23/2018   Lab Results  Component Value Date   LDLCALC 140 (H) 06/27/2019   LDLCALC 151 (H) 12/30/2018   LDLCALC 149 (H) 06/23/2018   Lab Results  Component Value Date   TRIG 43 06/27/2019   TRIG 48 12/30/2018   TRIG 44 06/23/2018   Lab Results  Component Value Date   CHOLHDL 2.6 06/27/2019   CHOLHDL 2.9 12/30/2018   CHOLHDL 3.3 06/23/2018   No results found for: LDLDIRECT    Radiology: No results found.  EKG: 07/12/18 SR rate 62 normal     ASSESSMENT AND PLAN:   1. Chest Pain: atypical normal ECG normal myovue 2014 f/u exercise myovue  2. HTN:  Continue daily beta blocker for BP and tremor  F/U TAT  3. ETOH/Abuse:  Sober residual anxiety declined  librium from primary  4. HLD:  F/u primary 5. Cramps:  Non vascular with normal ABI's 11/15/18  6. Dyspnea:  History of some asthma  Last CXR 03/15/19 NAD  Echo to assess RV/LV function   Signed: Jenkins Rouge 08/28/2019, 8:26 AM

## 2019-08-28 ENCOUNTER — Ambulatory Visit: Payer: Medicare HMO | Admitting: Cardiovascular Disease

## 2019-08-28 ENCOUNTER — Encounter: Payer: Self-pay | Admitting: Cardiovascular Disease

## 2019-08-28 ENCOUNTER — Other Ambulatory Visit: Payer: Self-pay

## 2019-08-28 VITALS — BP 120/72 | HR 58 | Ht 68.0 in | Wt 161.0 lb

## 2019-08-28 DIAGNOSIS — R0600 Dyspnea, unspecified: Secondary | ICD-10-CM | POA: Diagnosis not present

## 2019-08-28 DIAGNOSIS — R079 Chest pain, unspecified: Secondary | ICD-10-CM

## 2019-08-28 DIAGNOSIS — I10 Essential (primary) hypertension: Secondary | ICD-10-CM | POA: Diagnosis not present

## 2019-08-28 NOTE — Patient Instructions (Addendum)
Medication Instructions:  *If you need a refill on your cardiac medications before your next appointment, please call your pharmacy*  Lab Work: If you have labs (blood work) drawn today and your tests are completely normal, you will receive your results only by: Marland Kitchen MyChart Message (if you have MyChart) OR . A paper copy in the mail If you have any lab test that is abnormal or we need to change your treatment, we will call you to review the results.  Testing/Procedures: Your physician has requested that you have an echocardiogram. Echocardiography is a painless test that uses sound waves to create images of your heart. It provides your doctor with information about the size and shape of your heart and how well your heart's chambers and valves are working. This procedure takes approximately one hour. There are no restrictions for this procedure.  Your physician has requested that you have a lexiscan myoview. For further information please visit [email or web address]. Please follow instruction sheet, as given.  Follow-Up: At [facility], you and your health needs are our priority.  As part of our continuing mission to provide you with exceptional heart care, we have created designated Provider Care Teams.  These Care Teams include your primary Cardiologist (physician) and Advanced Practice Providers (APPs -  Physician Assistants and Nurse Practitioners) who all work together to provide you with the care you need, when you need it.  We recommend signing up for the patient portal called "MyChart".  Sign up information is provided on this After Visit Summary.  MyChart is used to connect with patients for Virtual Visits (Telemedicine).  Patients are able to view lab/test results, encounter notes, upcoming appointments, etc.  Non-urgent messages can be sent to your provider as well.   To learn more about what you can do with MyChart, go to [email or web address].    Your next appointment:   As  needed  The format for your next appointment:   Either In Person or Virtual  Provider:   You may see Dr. Johnsie Cancel or one of the following Advanced Practice Providers on your designated Care Team:    Truitt Merle, NP  Cecilie Kicks, NP  Kathyrn Drown, NP

## 2019-08-29 ENCOUNTER — Telehealth (HOSPITAL_COMMUNITY): Payer: Self-pay

## 2019-08-29 NOTE — Telephone Encounter (Signed)
Detailed instructions left on the patient's answering machine. Asked to call back with any questions. S.Kyerra Vargo EMTP 

## 2019-08-31 ENCOUNTER — Ambulatory Visit (HOSPITAL_COMMUNITY): Payer: Medicare HMO | Attending: Cardiology

## 2019-08-31 ENCOUNTER — Other Ambulatory Visit: Payer: Self-pay

## 2019-08-31 DIAGNOSIS — R0600 Dyspnea, unspecified: Secondary | ICD-10-CM

## 2019-08-31 DIAGNOSIS — R079 Chest pain, unspecified: Secondary | ICD-10-CM | POA: Diagnosis not present

## 2019-08-31 LAB — MYOCARDIAL PERFUSION IMAGING
LV dias vol: 81 mL (ref 46–106)
LV sys vol: 33 mL
Peak HR: 74 {beats}/min
Rest HR: 50 {beats}/min
SDS: 2
SRS: 0
SSS: 2
TID: 1.1

## 2019-08-31 MED ORDER — REGADENOSON 0.4 MG/5ML IV SOLN
0.4000 mg | Freq: Once | INTRAVENOUS | Status: AC
  Administered 2019-08-31: 0.4 mg via INTRAVENOUS

## 2019-08-31 MED ORDER — TECHNETIUM TC 99M TETROFOSMIN IV KIT
32.6000 | PACK | Freq: Once | INTRAVENOUS | Status: AC | PRN
  Administered 2019-08-31: 32.6 via INTRAVENOUS
  Filled 2019-08-31: qty 33

## 2019-08-31 MED ORDER — TECHNETIUM TC 99M TETROFOSMIN IV KIT
9.5000 | PACK | Freq: Once | INTRAVENOUS | Status: AC | PRN
  Administered 2019-08-31: 9.5 via INTRAVENOUS
  Filled 2019-08-31: qty 10

## 2019-09-06 ENCOUNTER — Ambulatory Visit (HOSPITAL_COMMUNITY): Payer: Medicare HMO | Attending: Cardiovascular Disease

## 2019-09-06 ENCOUNTER — Other Ambulatory Visit: Payer: Self-pay

## 2019-09-06 DIAGNOSIS — R079 Chest pain, unspecified: Secondary | ICD-10-CM | POA: Diagnosis not present

## 2019-09-06 DIAGNOSIS — R0600 Dyspnea, unspecified: Secondary | ICD-10-CM | POA: Diagnosis not present

## 2019-09-07 DIAGNOSIS — R0683 Snoring: Secondary | ICD-10-CM | POA: Diagnosis not present

## 2019-09-07 DIAGNOSIS — J310 Chronic rhinitis: Secondary | ICD-10-CM | POA: Diagnosis not present

## 2019-09-07 DIAGNOSIS — K219 Gastro-esophageal reflux disease without esophagitis: Secondary | ICD-10-CM | POA: Diagnosis not present

## 2019-09-07 DIAGNOSIS — R05 Cough: Secondary | ICD-10-CM | POA: Diagnosis not present

## 2019-09-15 ENCOUNTER — Other Ambulatory Visit: Payer: Self-pay | Admitting: Internal Medicine

## 2019-09-15 NOTE — Telephone Encounter (Signed)
I am confused. Did Cardiologist tell her to take it daily?

## 2019-09-15 NOTE — Telephone Encounter (Signed)
Pt calling wanting to see if she can get a refill on lisinopril (PRINIVIL,ZESTRIL) 5 MG tablet, she now goes to [facility] [address] in [place] [phone].

## 2019-09-18 ENCOUNTER — Telehealth (INDEPENDENT_AMBULATORY_CARE_PROVIDER_SITE_OTHER): Payer: Medicare HMO | Admitting: Internal Medicine

## 2019-09-18 ENCOUNTER — Telehealth: Payer: Self-pay | Admitting: Cardiovascular Disease

## 2019-09-18 DIAGNOSIS — I340 Nonrheumatic mitral (valve) insufficiency: Secondary | ICD-10-CM

## 2019-09-18 DIAGNOSIS — I10 Essential (primary) hypertension: Secondary | ICD-10-CM

## 2019-09-18 MED ORDER — LISINOPRIL 5 MG PO TABS: ORAL_TABLET | ORAL | 0 refills | Status: DC

## 2019-09-18 NOTE — Telephone Encounter (Signed)
New message   Patient was advised to call Dr. Johnsie Cancel office to find out if need to take lisinopril (PRINIVIL,ZESTRIL) 5 MG tablet daily. Please advise

## 2019-09-18 NOTE — Telephone Encounter (Signed)
She said she only takes it as needed. She will call her cardiologist to make him aware that she is only taking it as needed.

## 2019-09-18 NOTE — Telephone Encounter (Signed)
Patient stated she already talked to her PCP about lisinopril and has a prescription going to her pharmacy. Patient wanted to know about her echo results. Reassured patient about her results and that we will be doing another echo in one year. Patient verbalized understanding.

## 2019-09-18 NOTE — Telephone Encounter (Signed)
Spoke with patient today by phone. She will be traveling soon to the beach and then [facility] [place]. She needs refill on Lisinopril 5 mg to take prn when BP is elevated on trips which I think is anxiety related and longstanding. She had problem last time she went to [facility] with elevated BP and headache. Call in Lisinopril 5 mg #30 one po daily prn with no refill

## 2019-10-02 ENCOUNTER — Encounter: Payer: Self-pay | Admitting: *Deleted

## 2019-10-05 ENCOUNTER — Encounter: Payer: Self-pay | Admitting: Physician Assistant

## 2019-10-05 ENCOUNTER — Ambulatory Visit (INDEPENDENT_AMBULATORY_CARE_PROVIDER_SITE_OTHER): Payer: Medicare HMO | Admitting: Physician Assistant

## 2019-10-05 ENCOUNTER — Other Ambulatory Visit: Payer: Self-pay

## 2019-10-05 DIAGNOSIS — Z85828 Personal history of other malignant neoplasm of skin: Secondary | ICD-10-CM

## 2019-10-05 DIAGNOSIS — C44519 Basal cell carcinoma of skin of other part of trunk: Secondary | ICD-10-CM

## 2019-10-05 NOTE — Patient Instructions (Signed)

## 2019-10-05 NOTE — Progress Notes (Signed)
   Follow-Up Visit   Subjective  Donna [name] is a 69 y.o. female who presents for the following: Procedure (here for treatment right breast- bcc (sup+nod)).   The following portions of the chart were reviewed this encounter and updated as appropriate: Tobacco  Allergies  Meds  Problems  Med Hx  Surg Hx  Fam Hx      Objective  Well appearing patient in no apparent distress; mood and affect are within normal limits.  A focused examination was performed including chest. Relevant physical exam findings are noted in the Assessment and Plan.  Objective  Right Breast: Pink scar  Assessment & Plan  BCC (basal cell carcinoma), chest Right Breast  Destruction of lesion Complexity: simple   Destruction method: cryotherapy   Informed consent: discussed and consent obtained   Timeout:  patient name, date of birth, surgical site, and procedure verified Lesion destroyed using liquid nitrogen: Yes   Cryotherapy cycles:  1 Lesion length (cm):  1.8 Lesion width (cm):  1.8 Margin per side (cm):  0.1 Final wound size (cm):  2 Hemostasis achieved with:  aluminum chloride Outcome: patient tolerated procedure well with no complications   Post-procedure details: wound care instructions given    History of SCC (squamous cell carcinoma) of skin Chest - Medial (Center)  History of basal cell carcinoma (BCC) Left Breast

## 2019-10-10 ENCOUNTER — Other Ambulatory Visit: Payer: Self-pay | Admitting: Internal Medicine

## 2019-10-10 DIAGNOSIS — R05 Cough: Secondary | ICD-10-CM | POA: Diagnosis not present

## 2019-10-23 ENCOUNTER — Other Ambulatory Visit: Payer: Self-pay | Admitting: Internal Medicine

## 2019-10-23 DIAGNOSIS — R911 Solitary pulmonary nodule: Secondary | ICD-10-CM | POA: Diagnosis not present

## 2019-10-23 DIAGNOSIS — I77810 Thoracic aortic ectasia: Secondary | ICD-10-CM | POA: Diagnosis not present

## 2019-10-23 DIAGNOSIS — R05 Cough: Secondary | ICD-10-CM | POA: Diagnosis not present

## 2019-11-05 ENCOUNTER — Other Ambulatory Visit: Payer: Self-pay | Admitting: Internal Medicine

## 2019-11-06 DIAGNOSIS — R911 Solitary pulmonary nodule: Secondary | ICD-10-CM | POA: Diagnosis not present

## 2019-11-06 DIAGNOSIS — R05 Cough: Secondary | ICD-10-CM | POA: Diagnosis not present

## 2019-11-18 DIAGNOSIS — L03119 Cellulitis of unspecified part of limb: Secondary | ICD-10-CM | POA: Diagnosis not present

## 2019-11-18 DIAGNOSIS — W5501XA Bitten by cat, initial encounter: Secondary | ICD-10-CM | POA: Diagnosis not present

## 2019-11-21 ENCOUNTER — Telehealth: Payer: Self-pay | Admitting: Cardiovascular Disease

## 2019-11-21 DIAGNOSIS — R942 Abnormal results of pulmonary function studies: Secondary | ICD-10-CM | POA: Diagnosis not present

## 2019-11-21 DIAGNOSIS — R0602 Shortness of breath: Secondary | ICD-10-CM | POA: Diagnosis not present

## 2019-11-21 NOTE — Telephone Encounter (Signed)
Patient states she is in the chair about to have a methacholine challenge test completed. She is requesting to speak with a nurse to determine whether or not she is cleared to move forward with test due to risk of aneurysm. Please advise.

## 2019-11-21 NOTE — Telephone Encounter (Signed)
Per TL who received this call this morning from the operator. See phone note. Will send this call to pre op team to further evaluate. I do not see that our office ever received a clearance request.

## 2019-11-21 NOTE — Telephone Encounter (Signed)
   Discussed with Dr. Gardiner Rhyme. Patient with recent CT Chest showed ectatic ascending thoracic aorta measuring 3.9cm. Anticipating methacholine challenge test today. Her mild dilation of the aorta is not a contraindication for this test. Cleared to proceed from a cardiology standpoint.   Abigail Butts, PA-C 11/21/19; 8:53 AM

## 2019-11-25 ENCOUNTER — Other Ambulatory Visit: Payer: Self-pay | Admitting: Internal Medicine

## 2019-12-13 DIAGNOSIS — J45909 Unspecified asthma, uncomplicated: Secondary | ICD-10-CM | POA: Diagnosis not present

## 2019-12-25 ENCOUNTER — Other Ambulatory Visit: Payer: Self-pay

## 2019-12-25 ENCOUNTER — Other Ambulatory Visit: Payer: Medicare HMO | Admitting: Internal Medicine

## 2019-12-25 DIAGNOSIS — E7800 Pure hypercholesterolemia, unspecified: Secondary | ICD-10-CM | POA: Diagnosis not present

## 2019-12-25 LAB — HEPATIC FUNCTION PANEL
AG Ratio: 1.8 (calc) (ref 1.0–2.5)
ALT: 16 U/L (ref 6–29)
AST: 21 U/L (ref 10–35)
Albumin: 4.2 g/dL (ref 3.6–5.1)
Alkaline phosphatase (APISO): 68 U/L (ref 37–153)
Bilirubin, Direct: 0.1 mg/dL (ref 0.0–0.2)
Globulin: 2.4 g/dL (calc) (ref 1.9–3.7)
Indirect Bilirubin: 0.5 mg/dL (calc) (ref 0.2–1.2)
Total Bilirubin: 0.6 mg/dL (ref 0.2–1.2)
Total Protein: 6.6 g/dL (ref 6.1–8.1)

## 2019-12-25 LAB — LIPID PANEL
Cholesterol: 206 mg/dL — ABNORMAL HIGH (ref <200)
HDL: 84 mg/dL (ref >=50)
LDL Cholesterol (Calc): 109 mg/dL (calc) — ABNORMAL HIGH
Non-HDL Cholesterol (Calc): 122 mg/dL (calc) (ref <130)
Total CHOL/HDL Ratio: 2.5 (calc) (ref <5.0)
Triglycerides: 43 mg/dL (ref <150)

## 2019-12-28 DIAGNOSIS — Z1231 Encounter for screening mammogram for malignant neoplasm of breast: Secondary | ICD-10-CM | POA: Diagnosis not present

## 2019-12-28 LAB — HM MAMMOGRAPHY

## 2019-12-29 ENCOUNTER — Encounter: Payer: Self-pay | Admitting: Internal Medicine

## 2020-01-01 ENCOUNTER — Ambulatory Visit (INDEPENDENT_AMBULATORY_CARE_PROVIDER_SITE_OTHER): Payer: Medicare HMO | Admitting: Internal Medicine

## 2020-01-01 ENCOUNTER — Encounter: Payer: Self-pay | Admitting: Internal Medicine

## 2020-01-01 ENCOUNTER — Other Ambulatory Visit: Payer: Self-pay

## 2020-01-01 VITALS — BP 120/70 | HR 75 | Ht 68.0 in | Wt 157.0 lb

## 2020-01-01 DIAGNOSIS — I77810 Thoracic aortic ectasia: Secondary | ICD-10-CM

## 2020-01-01 DIAGNOSIS — R69 Illness, unspecified: Secondary | ICD-10-CM | POA: Diagnosis not present

## 2020-01-01 DIAGNOSIS — Z86010 Personal history of colonic polyps: Secondary | ICD-10-CM

## 2020-01-01 DIAGNOSIS — F1021 Alcohol dependence, in remission: Secondary | ICD-10-CM

## 2020-01-01 DIAGNOSIS — E7800 Pure hypercholesterolemia, unspecified: Secondary | ICD-10-CM | POA: Diagnosis not present

## 2020-01-01 DIAGNOSIS — G250 Essential tremor: Secondary | ICD-10-CM

## 2020-01-01 DIAGNOSIS — Z800 Family history of malignant neoplasm of digestive organs: Secondary | ICD-10-CM | POA: Diagnosis not present

## 2020-01-01 DIAGNOSIS — J4520 Mild intermittent asthma, uncomplicated: Secondary | ICD-10-CM

## 2020-01-01 NOTE — Progress Notes (Signed)
   Subjective:    Patient ID: Donna [name], female    DOB: 04/10/1951, 69 y.o.   MRN: [id number]  HPI 69 year old Female seen for 6 month follow up. Still has episodic elevations of BP treated with Zestril 5mg  daily until BP improves. BP aggravated by high elevations or situational stress with family. Patient does on- line therapy with a dream therapist.   Dr. Michela Pitcher, [place] with [facility], diagnosed with asthma and has been prescribed Flovent   Mild aortic dilatation.Mild mitral regurgitation.  History of hyperlipidemia but recent lipid panel is stable with cholesterol 206 ,HDL 84, triglycerides 43. LDL 109.  She is currently taking Crestor once a week.   Just had mammogram August 19. Needs GYN appt.  Precancerous polyp removed from ascending colon and repeat colonoscopy due 2022.  History of ectatic ascending thoracic aorta measuring 3.9 cm.  History of alcoholism in recovery continues to do well.  Continues to do yoga and stays in good shape.  History of essential tremor  History of hyperlipidemia  Review of Systems     Objective:   Physical Exam BP 120/70 pulse 75, regular.  Pulse oximetry 97% weight 157 pounds.  Height 5 feet 8 inches.  Neck is supple without JVD thyromegaly or carotid bruits.  Chest is clear to auscultation.  Cardiac exam: Regular rate and rhythm normal S1 and S2 without murmurs or gallops.       Assessment & Plan:  Pure hypercholesterolemia-stable on Crestor once weekly  Alcoholism in recovery and doing well  History of intermittent blood pressure readings associated with mild fatigue/anxiety.  Treated with as needed Zestril 5 mg daily whenever she is on a trip for his these issues.  Essential tremor treated with Inderal 10 mg 3 times daily  History of asthma treated with Ventolin  History of ectatic thoracic aorta 3.9 cm to be followed  Plan: Continue current regimen and return in 6 months.

## 2020-01-06 NOTE — Patient Instructions (Signed)
It was a pleasure to see you today.  Continue current regimen and follow-up in 6 months.

## 2020-01-09 ENCOUNTER — Other Ambulatory Visit: Payer: Self-pay

## 2020-01-09 ENCOUNTER — Encounter: Payer: Self-pay | Admitting: Physician Assistant

## 2020-01-09 ENCOUNTER — Ambulatory Visit: Payer: Medicare HMO | Admitting: Physician Assistant

## 2020-01-09 DIAGNOSIS — L821 Other seborrheic keratosis: Secondary | ICD-10-CM

## 2020-01-09 DIAGNOSIS — D1800 Hemangioma unspecified site: Secondary | ICD-10-CM

## 2020-01-09 DIAGNOSIS — L578 Other skin changes due to chronic exposure to nonionizing radiation: Secondary | ICD-10-CM

## 2020-01-09 DIAGNOSIS — Z1283 Encounter for screening for malignant neoplasm of skin: Secondary | ICD-10-CM | POA: Diagnosis not present

## 2020-01-09 DIAGNOSIS — L814 Other melanin hyperpigmentation: Secondary | ICD-10-CM | POA: Diagnosis not present

## 2020-01-09 DIAGNOSIS — L570 Actinic keratosis: Secondary | ICD-10-CM

## 2020-01-09 DIAGNOSIS — L910 Hypertrophic scar: Secondary | ICD-10-CM

## 2020-01-09 DIAGNOSIS — D229 Melanocytic nevi, unspecified: Secondary | ICD-10-CM | POA: Diagnosis not present

## 2020-01-09 DIAGNOSIS — Z85828 Personal history of other malignant neoplasm of skin: Secondary | ICD-10-CM | POA: Diagnosis not present

## 2020-01-09 MED ORDER — TRIAMCINOLONE ACETONIDE 10 MG/ML IJ SUSP
10.0000 mg | Freq: Once | INTRAMUSCULAR | Status: AC
  Administered 2020-01-09: 10 mg

## 2020-01-09 NOTE — Progress Notes (Signed)
   Follow-Up Visit   Subjective  Donna [name] is a 69 y.o. female who presents for the following: Follow-up (Hx of basal cell carcinoma--pt would like to check both breasts).   The following portions of the chart were reviewed this encounter and updated as appropriate: Tobacco  Allergies  Meds  Problems  Med Hx  Surg Hx  Fam Hx      Objective  Well appearing patient in no apparent distress; mood and affect are within normal limits.  A full examination was performed including scalp, head, eyes, ears, nose, lips, neck, chest, axillae, abdomen, back, buttocks, bilateral upper extremities, bilateral lower extremities, hands, feet, fingers, toes, fingernails, and toenails. All findings within normal limits unless otherwise noted below.  Objective  Chest - Medial [facility]), Left Breast: Firm pink dermal papules/plaques c/w keloid.   Objective  Left Breast, Left Flank (2), Right Breast (2), Right Inframammary Fold: Erythematous patches with gritty scale.  Objective  Left Abdomen (side) - Upper: No atypical nevi No signs of non-mole skin cancer.    Assessment & Plan  Keloid (2) Chest - Medial [facility]); Left Breast  Intralesional injection - Chest - Medial [facility]), Left Breast Location: chest  Informed Consent: Discussed risks (infection, pain, bleeding, bruising, thinning of the skin, loss of skin pigment, lack of resolution, and recurrence of lesion) and benefits of the procedure, as well as the alternatives. Informed consent was obtained. Preparation: The area was prepared a standard fashion.  Anesthesia:none  Procedure Details: An intralesional injection was performed with Kenalog 10 mg/cc. 0.1cc in total were injected.  Plan: The patient was instructed on post-op care. Recommend OTC analgesia as needed for pain.   AK (actinic keratosis) (6) Left Breast; Right Breast (2); Right Inframammary Fold; Left Flank (2)  Destruction of lesion - Left Breast, Left Flank (2),  Right Breast (2), Right Inframammary Fold Complexity: simple   Destruction method: cryotherapy   Informed consent: discussed and consent obtained   Timeout:  patient name, date of birth, surgical site, and procedure verified Lesion destroyed using liquid nitrogen: Yes   Cryotherapy cycles:  1 Outcome: patient tolerated procedure well with no complications   Post-procedure details: wound care instructions given    Screening exam for skin cancer Left Abdomen (side) - Upper  Biannual skin examination  Lentigines - Scattered tan macules - Discussed due to sun exposure - Benign, observe - Call for any changes  Seborrheic Keratoses - Stuck-on, waxy, tan-brown papules and plaques  - Discussed benign etiology and prognosis. - Observe - Call for any changes  Melanocytic Nevi - Tan-brown and/or pink-flesh-colored symmetric macules and papules - Benign appearing on exam today - Observation - Call clinic for new or changing moles - Recommend daily use of broad spectrum spf 30+ sunscreen to sun-exposed areas.   Hemangiomas - Red papules - Discussed benign nature - Observe - Call for any changes  Actinic Damage - diffuse scaly erythematous macules with underlying dyspigmentation - Recommend daily broad spectrum sunscreen SPF 30+ to sun-exposed areas, reapply every 2 hours as needed.  - Call for new or changing lesions.  Skin cancer screening performed today.   I, Bellarae Lizer, PA-C, have reviewed all documentation's for this visit.  The documentation on 01/09/20 for the exam, diagnosis, procedures and orders are all accurate and complete.

## 2020-01-17 DIAGNOSIS — K219 Gastro-esophageal reflux disease without esophagitis: Secondary | ICD-10-CM | POA: Diagnosis not present

## 2020-01-17 DIAGNOSIS — J310 Chronic rhinitis: Secondary | ICD-10-CM | POA: Diagnosis not present

## 2020-01-17 DIAGNOSIS — J45909 Unspecified asthma, uncomplicated: Secondary | ICD-10-CM | POA: Diagnosis not present

## 2020-01-22 ENCOUNTER — Other Ambulatory Visit: Payer: Self-pay | Admitting: Internal Medicine

## 2020-02-01 ENCOUNTER — Encounter: Payer: Self-pay | Admitting: Gastroenterology

## 2020-02-01 ENCOUNTER — Ambulatory Visit: Payer: Medicare HMO | Admitting: Gastroenterology

## 2020-02-01 VITALS — BP 114/64 | HR 60 | Ht 67.0 in | Wt 158.0 lb

## 2020-02-01 DIAGNOSIS — R059 Cough, unspecified: Secondary | ICD-10-CM

## 2020-02-01 DIAGNOSIS — R05 Cough: Secondary | ICD-10-CM | POA: Diagnosis not present

## 2020-02-01 DIAGNOSIS — R1310 Dysphagia, unspecified: Secondary | ICD-10-CM | POA: Diagnosis not present

## 2020-02-01 NOTE — Patient Instructions (Signed)
If you are age 69 or older, your body mass index should be between 23-30. Your Body mass index is 24.75 kg/m. If this is out of the aforementioned range listed, please consider follow up with your Primary Care Provider.  If you are age 40 or younger, your body mass index should be between 19-25. Your Body mass index is 24.75 kg/m. If this is out of the aformentioned range listed, please consider follow up with your Primary Care Provider.   You have been scheduled for a Barium Esophogram at [facility] (1st floor of the hospital) on 02/19/2020 at 10:30am. Please arrive 15 minutes prior to your appointment for registration. Make certain not to have anything to eat or drink 3 hours prior to your test. If you need to reschedule for any reason, please contact radiology at [phone] to do so. __________________________________________________________________ A barium swallow is an examination that concentrates on views of the esophagus. This tends to be a double contrast exam (barium and two liquids which, when combined, create a gas to distend the wall of the oesophagus) or single contrast (non-ionic iodine based). The study is usually tailored to your symptoms so a good history is essential. Attention is paid during the study to the form, structure and configuration of the esophagus, looking for functional disorders (such as aspiration, dysphagia, achalasia, motility and reflux) EXAMINATION You may be asked to change into a gown, depending on the type of swallow being performed. A radiologist and radiographer will perform the procedure. The radiologist will advise you of the type of contrast selected for your procedure and direct you during the exam. You will be asked to stand, sit or lie in several different positions and to hold a small amount of fluid in your mouth before being asked to swallow while the imaging is performed .In some instances you may be asked to swallow barium coated  marshmallows to assess the motility of a solid food bolus. The exam can be recorded as a digital or video fluoroscopy procedure. POST PROCEDURE It will take 1-2 days for the barium to pass through your system. To facilitate this, it is important, unless otherwise directed, to increase your fluids for the next 24-48hrs and to resume your normal diet.  This test typically takes about 30 minutes to perform. __________________________________________________________________________________  Thank you for choosing me and [facility].  Pricilla Riffle. Dagoberto Ligas., MD., [facility] Regal

## 2020-02-01 NOTE — Progress Notes (Signed)
    History of Present Illness: This is a 69 year old female with a chronic cough and dysphagia.  She has been evaluated by pulmonary and is felt to have cough variant asthma.  She has rare episodes of dyspeptic epigastric complaints with foods like pizza.  She does not describe heartburn.  She has been tried on PPIs for 2 different trials without any impact on her cough.  She is currently off of PPI and feels her cough is no different. She feels Claritin has reduced her cough symptoms. She has had episodes of choking particularly with cold beverages but occasionally with cold foods.  She describes choking coughing and some difficulty swallowing.  EGD performed in October 2011 showed LA class a erosive esophagitis.  Current Medications, Allergies, Past Medical History, Past Surgical History, Family History and Social History were reviewed in [employer] record.   Physical Exam: General: Well developed, well nourished, no acute distress Head: Normocephalic and atraumatic Eyes:  sclerae anicteric, EOMI Ears: Normal auditory acuity Mouth: Not examined, mask on during Covid-19 pandemic Lungs: Clear throughout to auscultation Heart: Regular rate and rhythm; no murmurs, rubs or bruits Abdomen: Soft, non tender and non distended. No masses, hepatosplenomegaly or hernias noted. Normal Bowel sounds Rectal: Not done Musculoskeletal: Symmetrical with no gross deformities  Pulses:  Normal pulses noted Extremities: No clubbing, cyanosis, edema or deformities noted Neurological: Alert oriented x 4, grossly nonfocal Psychological:  Alert and cooperative. Normal mood and affect   Assessment and Recommendations:  1. GERD with a history of LA Class A esophagus.  She does not currently have active reflux symptoms.  Her cough did not change with PPI therapy and I do not feel her cough is related to GERD.  Follow antireflux measures.  Resume a PPI daily if reflux symptoms become more  active.  2.  Dysphagia associated with coughing particularly with cold liquids and occasionally with solids.  Symptoms are likely oropharyngeal.  Rule out esophageal disorders.  Proceed with barium esophagram.  Consider EGD based on barium esophagram findings.  3. Personal history of adenomatous colon polyps. Strong family history of colon cancer. Next colonoscopy is due in June 2022.

## 2020-02-05 ENCOUNTER — Telehealth: Payer: Self-pay | Admitting: Gastroenterology

## 2020-02-05 NOTE — Telephone Encounter (Signed)
Patient provided the number to central scheduling.  She will call back for any additional questions or concerns.

## 2020-02-05 NOTE — Telephone Encounter (Signed)
Pt is requesting to reschedule her barium xray she has scheduled for 10/11.

## 2020-02-19 ENCOUNTER — Ambulatory Visit (HOSPITAL_COMMUNITY): Payer: Medicare HMO

## 2020-02-28 ENCOUNTER — Other Ambulatory Visit: Payer: Self-pay

## 2020-02-28 ENCOUNTER — Ambulatory Visit (HOSPITAL_COMMUNITY)
Admission: RE | Admit: 2020-02-28 | Discharge: 2020-02-28 | Disposition: A | Discharge status: Home / Self Care | Payer: Medicare HMO | Source: Ambulatory Visit | Attending: Gastroenterology | Admitting: Gastroenterology

## 2020-02-28 DIAGNOSIS — R059 Cough, unspecified: Secondary | ICD-10-CM | POA: Diagnosis not present

## 2020-02-28 DIAGNOSIS — R1310 Dysphagia, unspecified: Secondary | ICD-10-CM

## 2020-02-28 DIAGNOSIS — K449 Diaphragmatic hernia without obstruction or gangrene: Secondary | ICD-10-CM | POA: Diagnosis not present

## 2020-03-01 ENCOUNTER — Telehealth: Payer: Self-pay | Admitting: Gastroenterology

## 2020-03-01 NOTE — Telephone Encounter (Signed)
Patient notified of the results.  See results notes for additional details

## 2020-03-01 NOTE — Telephone Encounter (Signed)
Pt is requesting a call back from [facility]

## 2020-03-25 ENCOUNTER — Ambulatory Visit (AMBULATORY_SURGERY_CENTER): Payer: Self-pay | Admitting: *Deleted

## 2020-03-25 ENCOUNTER — Other Ambulatory Visit: Payer: Self-pay

## 2020-03-25 VITALS — Ht 67.0 in | Wt 155.0 lb

## 2020-03-25 DIAGNOSIS — R1310 Dysphagia, unspecified: Secondary | ICD-10-CM

## 2020-03-25 NOTE — Progress Notes (Signed)

## 2020-04-01 ENCOUNTER — Other Ambulatory Visit: Payer: Self-pay

## 2020-04-01 ENCOUNTER — Ambulatory Visit (AMBULATORY_SURGERY_CENTER): Payer: Medicare HMO | Admitting: Gastroenterology

## 2020-04-01 ENCOUNTER — Encounter: Payer: Self-pay | Admitting: Gastroenterology

## 2020-04-01 VITALS — BP 113/63 | HR 51 | Temp 97.4°F | Resp 13 | Ht 67.0 in | Wt 158.0 lb

## 2020-04-01 DIAGNOSIS — K449 Diaphragmatic hernia without obstruction or gangrene: Secondary | ICD-10-CM | POA: Diagnosis not present

## 2020-04-01 DIAGNOSIS — R1310 Dysphagia, unspecified: Secondary | ICD-10-CM | POA: Diagnosis not present

## 2020-04-01 DIAGNOSIS — J45909 Unspecified asthma, uncomplicated: Secondary | ICD-10-CM | POA: Diagnosis not present

## 2020-04-01 DIAGNOSIS — I10 Essential (primary) hypertension: Secondary | ICD-10-CM | POA: Diagnosis not present

## 2020-04-01 DIAGNOSIS — R933 Abnormal findings on diagnostic imaging of other parts of digestive tract: Secondary | ICD-10-CM

## 2020-04-01 MED ORDER — SODIUM CHLORIDE 0.9 % IV SOLN: 500.0000 mL | INTRAVENOUS | Status: DC

## 2020-04-01 NOTE — Progress Notes (Signed)
C.W. vital signs. 

## 2020-04-01 NOTE — Progress Notes (Signed)
Called to room to assist during endoscopic procedure.  Patient ID and intended procedure confirmed with present staff. Received instructions for my participation in the procedure from the performing physician.  

## 2020-04-01 NOTE — Progress Notes (Signed)
Report to PACU, RN, vss, BBS= Clear.  

## 2020-04-01 NOTE — Patient Instructions (Signed)
Handouts given for Hiatal Hernia and Post esophageal dilation diet.  Follow the diet today and resume regular food tomorrow.  YOU HAD AN ENDOSCOPIC PROCEDURE TODAY AT [facility]:   Refer to the procedure report that was given to you for any specific questions about what was found during the examination.  If the procedure report does not answer your questions, please call your gastroenterologist to clarify.  If you requested that your care partner not be given the details of your procedure findings, then the procedure report has been included in a sealed envelope for you to review at your convenience later.  YOU SHOULD EXPECT: Some feelings of bloating in the abdomen. Passage of more gas than usual.  Walking can help get rid of the air that was put into your GI tract during the procedure and reduce the bloating. If you had a lower endoscopy (such as a colonoscopy or flexible sigmoidoscopy) you may notice spotting of blood in your stool or on the toilet paper. If you underwent a bowel prep for your procedure, you may not have a normal bowel movement for a few days.  Please Note:  You might notice some irritation and congestion in your nose or some drainage.  This is from the oxygen used during your procedure.  There is no need for concern and it should clear up in a day or so.  SYMPTOMS TO REPORT IMMEDIATELY:   Following upper endoscopy (EGD)  Vomiting of blood or coffee ground material  New chest pain or pain under the shoulder blades  Painful or persistently difficult swallowing  New shortness of breath  Fever of 100F or higher  Black, tarry-looking stools  For urgent or emergent issues, a gastroenterologist can be reached at any hour by calling [phone]. Do not use MyChart messaging for urgent concerns.    DIET:  We do recommend a small meal at first, but then you may proceed to your regular diet.  Drink plenty of fluids but you should avoid alcoholic beverages for  24 hours.  ACTIVITY:  You should plan to take it easy for the rest of today and you should NOT DRIVE or use heavy machinery until tomorrow (because of the sedation medicines used during the test).    FOLLOW UP: Our staff will call the number listed on your records 48-72 hours following your procedure to check on you and address any questions or concerns that you may have regarding the information given to you following your procedure. If we do not reach you, we will leave a message.  We will attempt to reach you two times.  During this call, we will ask if you have developed any symptoms of COVID 19. If you develop any symptoms (ie: fever, flu-like symptoms, shortness of breath, cough etc.) before then, please call [phone].  If you test positive for Covid 19 in the 2 weeks post procedure, please call and report this information to [place].    If any biopsies were taken you will be contacted by phone or by letter within the next 1-3 weeks.  Please call us at [phone] if you have not heard about the biopsies in 3 weeks.    SIGNATURES/CONFIDENTIALITY: You and/or your care partner have signed paperwork which will be entered into your electronic medical record.  These signatures attest to the fact that that the information above on your After Visit Summary has been reviewed and is understood.  Full responsibility of the confidentiality of this discharge  information lies with you and/or your care-partner. 

## 2020-04-01 NOTE — Progress Notes (Signed)
Pt's states no medical or surgical changes since previsit or office visit. 

## 2020-04-01 NOTE — Op Note (Signed)
[facility] Patient Name: Donna [name] Procedure Date: 04/01/2020 10:09 AM MRN: [id number] Endoscopist: Ladene Artist , MD Age: 69 Referring MD:  Date of Birth: 1951-02-17 Gender: Female Account #: [id number] Procedure:                Upper GI endoscopy Indications:              Dysphagia, Abnormal esophagram Medicines:                Monitored Anesthesia Care Procedure:                Pre-Anesthesia Assessment:                           - Prior to the procedure, a History and Physical                            was performed, and patient medications and                            allergies were reviewed. The patient's tolerance of                            previous anesthesia was also reviewed. The risks                            and benefits of the procedure and the sedation                            options and risks were discussed with the patient.                            All questions were answered, and informed consent                            was obtained. Prior Anticoagulants: The patient has                            taken no previous anticoagulant or antiplatelet                            agents. ASA Grade Assessment: II - A patient with                            mild systemic disease. After reviewing the risks                            and benefits, the patient was deemed in                            satisfactory condition to undergo the procedure.                           After obtaining informed consent, the endoscope was  passed under direct vision. Throughout the                            procedure, the patient's blood pressure, pulse, and                            oxygen saturations were monitored continuously. The                            Endoscope was introduced through the mouth, and                            advanced to the second part of duodenum. The upper                            GI endoscopy was  accomplished without difficulty.                            The patient tolerated the procedure well. Scope In: Scope Out: Findings:                 No endoscopic abnormality was evident in the                            esophagus to explain the patient's complaint of                            dysphagia. It was decided, however, to proceed with                            dilation of the entire esophagus. A guidewire was                            placed and the scope was withdrawn. Dilation was                            performed with a Savary dilator with no resistance                            at 17 mm.                           A small hiatal hernia was present.                           The exam of the stomach was otherwise normal.                           The duodenal bulb and second portion of the                            duodenum were normal. Complications:            No immediate complications. Estimated Blood Loss:     Estimated blood loss: none. Impression:               -  No endoscopic esophageal abnormality to explain                            patient's dysphagia. Esophagus dilated.                           - Small hiatal hernia.                           - Normal duodenal bulb and second portion of the                            duodenum.                           - No specimens collected. Recommendation:           - Patient has a contact number available for                            emergencies. The signs and symptoms of potential                            delayed complications were discussed with the                            patient. Return to normal activities tomorrow.                            Written discharge instructions were provided to the                            patient.                           - Clear liquid diet for 2 hours, then advance as                            tolerated to soft diet today.                           - Resume prior diet  tomorrow.                           - Continue present medications.                           - Follow up with PCP and Pulmonary. Ladene Artist, MD 04/01/2020 10:29:01 AM This report has been signed electronically.

## 2020-04-02 ENCOUNTER — Telehealth: Payer: Self-pay

## 2020-04-02 NOTE — Telephone Encounter (Signed)
°  Follow up Call-  Call back number 04/01/2020 10/15/2017  Post procedure Call Back phone  # [phone]  Permission to leave phone message Yes Yes  Some recent data might be hidden     Patient questions:  Do you have a fever, pain , or abdominal swelling? No. Pain Score  0 *  Have you tolerated food without any problems? Yes.    Have you been able to return to your normal activities? Yes.    Do you have any questions about your discharge instructions: Diet   No. Medications  No. Follow up visit  No.  Do you have questions or concerns about your Care? No.  Actions: * If pain score is 4 or above: No action needed, pain <4.  1. Have you developed a fever since your procedure? no  2.   Have you had an respiratory symptoms (SOB or cough) since your procedure? no 3.   Have you tested positive for COVID 19 since your procedure no  4.   Have you had any family members/close contacts diagnosed with the COVID 19 since your procedure? no  If yes to any of these questions please route to Joylene John, RN and Joella Prince, RN

## 2020-04-11 ENCOUNTER — Telehealth: Payer: Self-pay | Admitting: Internal Medicine

## 2020-04-11 NOTE — Telephone Encounter (Signed)
Pt called and said she needs a referral put in for a bone density scan, [facility] said it had been since 2017 since she had it done last. Please advise

## 2020-05-14 ENCOUNTER — Telehealth: Payer: Self-pay | Admitting: Internal Medicine

## 2020-05-14 NOTE — Telephone Encounter (Signed)
Needs OV today or tomorrow

## 2020-05-14 NOTE — Telephone Encounter (Signed)
LVM to CB to schedule an appointment for Thursday 05/16/2020

## 2020-05-14 NOTE — Telephone Encounter (Signed)
Donna [name] [phone]  [name] called to say she is having the pain in her neck shooting up to her skull like she had before., this started yesterday.

## 2020-05-15 NOTE — Telephone Encounter (Signed)
Scheduled

## 2020-05-16 ENCOUNTER — Other Ambulatory Visit: Payer: Self-pay

## 2020-05-16 ENCOUNTER — Ambulatory Visit (INDEPENDENT_AMBULATORY_CARE_PROVIDER_SITE_OTHER): Payer: Medicare HMO | Admitting: Internal Medicine

## 2020-05-16 ENCOUNTER — Encounter: Payer: Self-pay | Admitting: Internal Medicine

## 2020-05-16 VITALS — BP 140/90 | HR 73 | Temp 97.7°F | Ht 67.0 in | Wt 160.0 lb

## 2020-05-16 DIAGNOSIS — M7912 Myalgia of auxiliary muscles, head and neck: Secondary | ICD-10-CM | POA: Diagnosis not present

## 2020-05-16 NOTE — Patient Instructions (Signed)
Apply ice or heat to right neck 20 minutes several times a day. May take OTC antiinflammatory med. Patient declined prescription muscle relaxant or prescription antiinflammatory medication.

## 2020-05-16 NOTE — Progress Notes (Signed)
   Subjective:    Patient ID: Donna [name], female    DOB: March 20, 1951, 70 y.o.   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  Pain is in her right neck.  Does not recall any overexertion or heavy lifting.   History of left trapezius muscle pain treated with amitriptyline in the past per Dr. Dione Housekeeper.  She saw him in November 2019.  He felt she had underlying cervical degenerative disc disease.  Currently not on amitriptyline.  She had CT of the chest showing ectatic ascending thoracic aorta.  History of cough variant asthma seen by Dr. Jerre Simon and treated with Flovent.  History of dysphagia seen by Dr. Russella Dar in September 2021.  In 2011 she had upper endoscopy showing erosive esophagitis.  Had single column barium esophagram October 2021 showing small type I hiatal hernia and mild distal esophageal fold thickening which could be associated with esophagitis.  Review of Systems does not recall any overexertion causing her pain.     Objective:   Physical Exam Vital signs reviewed.  Blood pressure 140/90 but she is slightly anxious today.  Pulse 73 temperature 97.7 pulse oximetry 97% weight 160 pounds BMI 25.06.  She has palpable muscle spasm in right sternocleidomastoid muscle area with palpation.  Full range of motion in the right upper extremity.       Assessment & Plan:  Muscle spasm right sternocleidomastoid muscle  Plan: Offered prescription muscle relaxant and anti-inflammatory medication but patient declined.  May use ice or heat for the spasm and call if symptoms or not improving in 5 to 7 days or sooner if worse.  May take over-the-counter anti-inflammatory medication.

## 2020-06-25 DIAGNOSIS — Z01419 Encounter for gynecological examination (general) (routine) without abnormal findings: Secondary | ICD-10-CM | POA: Diagnosis not present

## 2020-06-25 DIAGNOSIS — Z124 Encounter for screening for malignant neoplasm of cervix: Secondary | ICD-10-CM | POA: Diagnosis not present

## 2020-07-02 ENCOUNTER — Other Ambulatory Visit: Payer: Medicare HMO | Admitting: Internal Medicine

## 2020-07-02 DIAGNOSIS — I10 Essential (primary) hypertension: Secondary | ICD-10-CM | POA: Diagnosis not present

## 2020-07-02 DIAGNOSIS — Z0000 Encounter for general adult medical examination without abnormal findings: Secondary | ICD-10-CM

## 2020-07-02 DIAGNOSIS — I77810 Thoracic aortic ectasia: Secondary | ICD-10-CM

## 2020-07-02 DIAGNOSIS — G250 Essential tremor: Secondary | ICD-10-CM

## 2020-07-02 DIAGNOSIS — F1021 Alcohol dependence, in remission: Secondary | ICD-10-CM

## 2020-07-02 DIAGNOSIS — E7800 Pure hypercholesterolemia, unspecified: Secondary | ICD-10-CM

## 2020-07-02 DIAGNOSIS — M7912 Myalgia of auxiliary muscles, head and neck: Secondary | ICD-10-CM | POA: Diagnosis not present

## 2020-07-02 DIAGNOSIS — J4520 Mild intermittent asthma, uncomplicated: Secondary | ICD-10-CM | POA: Diagnosis not present

## 2020-07-02 DIAGNOSIS — F411 Generalized anxiety disorder: Secondary | ICD-10-CM

## 2020-07-02 DIAGNOSIS — R7309 Other abnormal glucose: Secondary | ICD-10-CM | POA: Diagnosis not present

## 2020-07-02 DIAGNOSIS — Z86010 Personal history of colonic polyps: Secondary | ICD-10-CM

## 2020-07-02 DIAGNOSIS — Z800 Family history of malignant neoplasm of digestive organs: Secondary | ICD-10-CM

## 2020-07-02 DIAGNOSIS — R69 Illness, unspecified: Secondary | ICD-10-CM | POA: Diagnosis not present

## 2020-07-09 ENCOUNTER — Encounter: Payer: Self-pay | Admitting: Internal Medicine

## 2020-07-09 ENCOUNTER — Other Ambulatory Visit: Payer: Self-pay

## 2020-07-09 ENCOUNTER — Ambulatory Visit (INDEPENDENT_AMBULATORY_CARE_PROVIDER_SITE_OTHER): Payer: Medicare HMO | Admitting: Internal Medicine

## 2020-07-09 VITALS — BP 120/70 | Ht 67.0 in | Wt 159.0 lb

## 2020-07-09 DIAGNOSIS — J4520 Mild intermittent asthma, uncomplicated: Secondary | ICD-10-CM

## 2020-07-09 DIAGNOSIS — Z1159 Encounter for screening for other viral diseases: Secondary | ICD-10-CM | POA: Diagnosis not present

## 2020-07-09 DIAGNOSIS — R69 Illness, unspecified: Secondary | ICD-10-CM | POA: Diagnosis not present

## 2020-07-09 DIAGNOSIS — Z800 Family history of malignant neoplasm of digestive organs: Secondary | ICD-10-CM | POA: Diagnosis not present

## 2020-07-09 DIAGNOSIS — R197 Diarrhea, unspecified: Secondary | ICD-10-CM

## 2020-07-09 DIAGNOSIS — Z0000 Encounter for general adult medical examination without abnormal findings: Secondary | ICD-10-CM

## 2020-07-09 DIAGNOSIS — F1021 Alcohol dependence, in remission: Secondary | ICD-10-CM

## 2020-07-09 DIAGNOSIS — R5383 Other fatigue: Secondary | ICD-10-CM

## 2020-07-09 DIAGNOSIS — F439 Reaction to severe stress, unspecified: Secondary | ICD-10-CM

## 2020-07-09 DIAGNOSIS — G250 Essential tremor: Secondary | ICD-10-CM | POA: Diagnosis not present

## 2020-07-09 DIAGNOSIS — I77810 Thoracic aortic ectasia: Secondary | ICD-10-CM

## 2020-07-09 DIAGNOSIS — Z86010 Personal history of colonic polyps: Secondary | ICD-10-CM

## 2020-07-09 DIAGNOSIS — L659 Nonscarring hair loss, unspecified: Secondary | ICD-10-CM

## 2020-07-09 DIAGNOSIS — K2100 Gastro-esophageal reflux disease with esophagitis, without bleeding: Secondary | ICD-10-CM | POA: Diagnosis not present

## 2020-07-09 LAB — CBC WITH DIFFERENTIAL/PLATELET
Absolute Monocytes: 456 cells/uL (ref 200–950)
Basophils Absolute: 30 cells/uL (ref 0–200)
Basophils Relative: 0.8 %
Eosinophils Absolute: 61 cells/uL (ref 15–500)
Eosinophils Relative: 1.6 %
HCT: 38 % (ref 35.0–45.0)
Hemoglobin: 12.9 g/dL (ref 11.7–15.5)
Lymphs Abs: 882 cells/uL (ref 850–3900)
MCH: 30.7 pg (ref 27.0–33.0)
MCHC: 33.9 g/dL (ref 32.0–36.0)
MCV: 90.5 fL (ref 80.0–100.0)
MPV: 9.3 fL (ref 7.5–12.5)
Monocytes Relative: 12 %
Neutro Abs: 2371 cells/uL (ref 1500–7800)
Neutrophils Relative %: 62.4 %
Platelets: 265 10*3/uL (ref 140–400)
RBC: 4.2 10*6/uL (ref 3.80–5.10)
RDW: 12.6 % (ref 11.0–15.0)
Total Lymphocyte: 23.2 %
WBC: 3.8 10*3/uL (ref 3.8–10.8)

## 2020-07-09 LAB — COMPLETE METABOLIC PANEL WITH GFR
AG Ratio: 1.9 (calc) (ref 1.0–2.5)
ALT: 21 U/L (ref 6–29)
AST: 22 U/L (ref 10–35)
Albumin: 4.3 g/dL (ref 3.6–5.1)
Alkaline phosphatase (APISO): 63 U/L (ref 37–153)
BUN: 15 mg/dL (ref 7–25)
CO2: 28 mmol/L (ref 20–32)
Calcium: 9.3 mg/dL (ref 8.6–10.4)
Chloride: 100 mmol/L (ref 98–110)
Creat: 0.73 mg/dL (ref 0.50–0.99)
GFR, Est African American: 98 mL/min/{1.73_m2} (ref >=60)
GFR, Est Non African American: 85 mL/min/{1.73_m2} (ref >=60)
Globulin: 2.3 g/dL (calc) (ref 1.9–3.7)
Glucose, Bld: 102 mg/dL — ABNORMAL HIGH (ref 65–99)
Potassium: 4.4 mmol/L (ref 3.5–5.3)
Sodium: 136 mmol/L (ref 135–146)
Total Bilirubin: 0.6 mg/dL (ref 0.2–1.2)
Total Protein: 6.6 g/dL (ref 6.1–8.1)

## 2020-07-09 LAB — POCT URINALYSIS DIPSTICK
Appearance: NEGATIVE
Bilirubin, UA: NEGATIVE
Blood, UA: NEGATIVE
Glucose, UA: NEGATIVE
Ketones, UA: NEGATIVE
Leukocytes, UA: NEGATIVE
Nitrite, UA: NEGATIVE
Odor: NEGATIVE
Protein, UA: NEGATIVE
Spec Grav, UA: 1.01 (ref 1.010–1.025)
Urobilinogen, UA: 0.2 E.U./dL
pH, UA: 6.5 (ref 5.0–8.0)

## 2020-07-09 LAB — LIPID PANEL
Cholesterol: 220 mg/dL — ABNORMAL HIGH (ref <200)
HDL: 100 mg/dL (ref >=50)
LDL Cholesterol (Calc): 109 mg/dL (calc) — ABNORMAL HIGH
Non-HDL Cholesterol (Calc): 120 mg/dL (calc) (ref <130)
Total CHOL/HDL Ratio: 2.2 (calc) (ref <5.0)
Triglycerides: 37 mg/dL (ref <150)

## 2020-07-09 LAB — T4, FREE

## 2020-07-09 LAB — HEMOGLOBIN A1C
Hgb A1c MFr Bld: 5.2 % of total Hgb (ref <5.7)
Mean Plasma Glucose: 103 mg/dL
eAG (mmol/L): 5.7 mmol/L

## 2020-07-09 LAB — TSH: TSH: 1.35 mIU/L (ref 0.40–4.50)
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
## 2020-07-12 ENCOUNTER — Telehealth: Payer: Self-pay | Admitting: Internal Medicine

## 2020-07-12 NOTE — Telephone Encounter (Signed)
Donna [name] [phone]  [name] called back to say she and her husband had talked about it and she does want a referral to go to and endocrinologist and she would like to got to the same one her husband goes to  Dr Chalmers Cater with [facility]. She said Thank you for the letter to [place] system.

## 2020-07-12 NOTE — Telephone Encounter (Signed)
Donna [name] [phone]  Dr Renold Genta wrote letter to defer patient from jury duty this time because of her mothers illness. Letter was mailed 07/09/2020  Chief [place] Attention: [address] [employer], [place]  [address]  Juror # [id number] Electronic Signature [phone] Sub-Panel: [phone]

## 2020-07-18 NOTE — Patient Instructions (Addendum)
Referral to Endocrinology and Rheumatology as patient requests.  Lipids have improved over the past year.  Will not continue with statin medication.  Encourage psychotherapy due to family stress.

## 2020-07-18 NOTE — Telephone Encounter (Signed)
She wants referral to Rheumatology- can refer to [facility] and Dr. Chalmers Cater (Endocrinologist)

## 2020-07-19 NOTE — Telephone Encounter (Signed)
Patient called back she was at work and will call back next week to schedule an appointment to discuss rheumatology.

## 2020-07-19 NOTE — Telephone Encounter (Signed)
Referral are placed

## 2020-07-19 NOTE — Telephone Encounter (Signed)
LVM for patient to CB, we need to schedule OV to discuss referral to Rheumatology and do labs, that referral was denied.

## 2020-08-08 ENCOUNTER — Telehealth: Payer: Self-pay | Admitting: Internal Medicine

## 2020-08-08 DIAGNOSIS — Z0289 Encounter for other administrative examinations: Secondary | ICD-10-CM

## 2020-08-08 NOTE — Telephone Encounter (Signed)
Patient changing PCP - Dr [name] at [facility] [phone], Fax [phone]  Received request to fax records sent 90 pages of office notes and labs electronically. Faxed 10 pages Demographics, H&P, Medication List.  Notified patient that this was done.  Removed Dr Renold Genta as PCP

## 2020-08-20 DIAGNOSIS — M5126 Other intervertebral disc displacement, lumbar region: Secondary | ICD-10-CM | POA: Diagnosis not present

## 2020-08-20 DIAGNOSIS — R5383 Other fatigue: Secondary | ICD-10-CM | POA: Diagnosis not present

## 2020-08-20 DIAGNOSIS — G250 Essential tremor: Secondary | ICD-10-CM | POA: Diagnosis not present

## 2020-08-20 DIAGNOSIS — M47812 Spondylosis without myelopathy or radiculopathy, cervical region: Secondary | ICD-10-CM | POA: Diagnosis not present

## 2020-08-20 DIAGNOSIS — Z79899 Other long term (current) drug therapy: Secondary | ICD-10-CM | POA: Diagnosis not present

## 2020-08-20 DIAGNOSIS — M2550 Pain in unspecified joint: Secondary | ICD-10-CM | POA: Diagnosis not present

## 2020-08-20 DIAGNOSIS — M2540 Effusion, unspecified joint: Secondary | ICD-10-CM | POA: Diagnosis not present

## 2020-08-20 DIAGNOSIS — M792 Neuralgia and neuritis, unspecified: Secondary | ICD-10-CM | POA: Diagnosis not present

## 2020-08-20 DIAGNOSIS — I10 Essential (primary) hypertension: Secondary | ICD-10-CM | POA: Diagnosis not present

## 2020-08-20 DIAGNOSIS — Z8619 Personal history of other infectious and parasitic diseases: Secondary | ICD-10-CM | POA: Diagnosis not present

## 2020-09-09 DIAGNOSIS — L680 Hirsutism: Secondary | ICD-10-CM | POA: Diagnosis not present

## 2020-09-09 DIAGNOSIS — L659 Nonscarring hair loss, unspecified: Secondary | ICD-10-CM | POA: Diagnosis not present

## 2020-09-09 DIAGNOSIS — R5383 Other fatigue: Secondary | ICD-10-CM | POA: Diagnosis not present

## 2020-09-11 ENCOUNTER — Other Ambulatory Visit: Payer: Self-pay

## 2020-09-11 ENCOUNTER — Ambulatory Visit (HOSPITAL_COMMUNITY): Payer: Medicare HMO | Attending: Cardiology

## 2020-09-11 DIAGNOSIS — I340 Nonrheumatic mitral (valve) insufficiency: Secondary | ICD-10-CM | POA: Diagnosis not present

## 2020-09-11 LAB — ECHOCARDIOGRAM COMPLETE
Area-P 1/2: 3.48 cm2
S' Lateral: 3.8 cm

## 2020-10-01 DIAGNOSIS — M8589 Other specified disorders of bone density and structure, multiple sites: Secondary | ICD-10-CM | POA: Diagnosis not present

## 2020-10-01 DIAGNOSIS — Z780 Asymptomatic menopausal state: Secondary | ICD-10-CM | POA: Diagnosis not present

## 2020-11-14 ENCOUNTER — Encounter: Payer: Self-pay | Admitting: Family Medicine

## 2020-11-14 ENCOUNTER — Other Ambulatory Visit: Payer: Self-pay

## 2020-11-14 ENCOUNTER — Ambulatory Visit: Payer: Self-pay

## 2020-11-14 ENCOUNTER — Ambulatory Visit: Payer: Medicare HMO | Admitting: Family Medicine

## 2020-11-14 VITALS — BP 132/70 | Ht 67.0 in | Wt 159.0 lb

## 2020-11-14 DIAGNOSIS — M79672 Pain in left foot: Secondary | ICD-10-CM | POA: Diagnosis not present

## 2020-11-14 DIAGNOSIS — M7742 Metatarsalgia, left foot: Secondary | ICD-10-CM

## 2020-11-14 DIAGNOSIS — M6788 Other specified disorders of synovium and tendon, other site: Secondary | ICD-10-CM | POA: Diagnosis not present

## 2020-11-14 NOTE — Progress Notes (Signed)
Donna [name] - 70 y.o. female MRN [id number]  Date of birth: [birth year]  SUBJECTIVE:  Including CC & ROS.  No chief complaint on file.   Donna [name] is a 70 y.o. female that is presenting with left foot pain and right Achilles pain.  Left foot pain has been ongoing for a few months.  Has a shocking sensation over the first ray that radiates proximally to the mid tibia.  No new or different activities.  Also have a history of chronic Achilles tendinopathy of the right.   Review of Systems See HPI   HISTORY: Past Medical, Surgical, Social, and Family History Reviewed & Updated per EMR.   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   chest - CX3 + 5FU   Squamous cell carcinoma of skin 09/27/2014   left thigh - tx p bx   Substance abuse ([place])     Past Surgical History:  Procedure Laterality Date   ANKLE SURGERY     Bilateral    CATARACT EXTRACTION Bilateral    TUBAL LIGATION      Family History  Problem Relation Age of Onset   Colon cancer Father 66       Survived   Heart disease Father    Heart attack Father    Rheum arthritis Father    Dementia Father    Diabetes Mother    Colon cancer Paternal Grandmother    Colon polyps Paternal Aunt    Pancreatic cancer Paternal Aunt    Rectal cancer Paternal Aunt    Healthy Sister    Healthy Brother    Stomach cancer Neg Hx    Esophageal cancer Neg Hx    Liver cancer Neg Hx     Social History   Socioeconomic History   Marital status: Married    Spouse name: Not on file   Number of children: 0   Years of education: Not on file   Highest  education level: Associate degree: academic program  Occupational History   Occupation: [employer]  Tobacco Use   Smoking status: Former    Years: 30.00    Pack years: 0.00    Types: Cigarettes    Quit date: 10/15/1996    Years since quitting: 24.0   Smokeless tobacco: Never  Vaping Use   Vaping Use: Never used  Substance and Sexual Activity   Alcohol use: No    Alcohol/week: 0.0 standard drinks    Comment: former alcohol abuse/ 1 year ago   Drug use: No   Sexual activity: Yes  Other Topics Concern   Not on file  Social History Narrative   2 caffeine drinks daily         Social Determinants of Health   Financial Resource Strain: Not on file  Food Insecurity: Not on file  Transportation Needs: Not on file  Physical Activity: Not on file  Stress: Not on file  Social Connections: Not on file  Intimate Partner Violence: Not on file     PHYSICAL EXAM:  VS: BP 132/70 (BP Location: Left Arm, Patient Position: Sitting, Cuff Size: Normal)   Ht 5\' 7"  (1.702 m)   Wt 159 lb (72.1 kg)   BMI 24.90 kg/m  Physical Exam Gen: NAD, alert, cooperative with exam, well-appearing MSK:  Left foot: Pes cavus. Loss of transverse arch. Dorsiflexion. Neurovascular intact  Limited ultrasound: Left foot and ankle:  Hyperechoic change at the insertion of the posterior tibialis. Mild effusion around the posterior tibialis just as it exits out of the tarsal tunnel. No ankle joint effusion. No changes over the base of the first metatarsal  Summary: Changes observed associated with the posterior tibialis  Ultrasound and interpretation by Clearance Coots, MD    ASSESSMENT & PLAN:   Achilles tendinosis of right lower extremity Acute on chronic in nature.  Has tried several different therapies. -Counseled supportive care. -Pursue shockwave therapy.  Metatarsalgia of left foot Seems less likely for the posterior tibialis as a source of her pain.  Her high arch and loss of the transverse  arch could be more of the blame. -Counseled on home exercise therapy and supportive care. -Provided scaphoid pads bilaterally. -Provided metatarsal pads bilaterally. -Could consider prednisone or imaging.

## 2020-11-14 NOTE — Patient Instructions (Signed)
Nice to meet you Please try the exercises  Please set up shockwave  Please send me a message in MyChart with any questions or updates.  Please see me back in 4 weeks.   --Dr. Raeford Razor

## 2020-11-14 NOTE — Assessment & Plan Note (Signed)
Seems less likely for the posterior tibialis as a source of her pain.  Her high arch and loss of the transverse arch could be more of the blame. -Counseled on home exercise therapy and supportive care. -Provided scaphoid pads bilaterally. -Provided metatarsal pads bilaterally. -Could consider prednisone or imaging.

## 2020-11-14 NOTE — Assessment & Plan Note (Signed)
Acute on chronic in nature.  Has tried several different therapies. -Counseled supportive care. -Pursue shockwave therapy.

## 2020-11-19 ENCOUNTER — Ambulatory Visit: Payer: Medicare HMO | Admitting: Family Medicine

## 2020-11-20 ENCOUNTER — Other Ambulatory Visit: Payer: Self-pay | Admitting: Internal Medicine

## 2020-11-21 ENCOUNTER — Encounter: Payer: Self-pay | Admitting: Family Medicine

## 2020-11-21 ENCOUNTER — Other Ambulatory Visit: Payer: Self-pay

## 2020-11-21 ENCOUNTER — Ambulatory Visit: Payer: Self-pay | Admitting: Family Medicine

## 2020-11-21 DIAGNOSIS — M6788 Other specified disorders of synovium and tendon, other site: Secondary | ICD-10-CM

## 2020-11-21 NOTE — Assessment & Plan Note (Signed)
Completed shockwave today. 

## 2020-11-21 NOTE — Progress Notes (Signed)
Donna [name] - 70 y.o. female MRN [id number]  Date of birth: 1950/10/26  SUBJECTIVE:  Including CC & ROS.  No chief complaint on file.   Donna [name] is a 70 y.o. female that is presenting for shockwave therapy..   Review of Systems See HPI   HISTORY: Past Medical, Surgical, Social, and Family History Reviewed & Updated per EMR.   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   chest - CX3 + 5FU   Squamous cell carcinoma of skin 09/27/2014   left thigh - tx p bx   Substance abuse ([place])     Past Surgical History:  Procedure Laterality Date   ANKLE SURGERY     Bilateral    CATARACT EXTRACTION Bilateral    TUBAL LIGATION      Family History  Problem Relation Age of Onset   Colon cancer Father 79       Survived   Heart disease Father    Heart attack Father    Rheum arthritis Father    Dementia Father    Diabetes Mother    Colon cancer Paternal Grandmother    Colon polyps Paternal Aunt    Pancreatic cancer Paternal Aunt    Rectal cancer Paternal Aunt    Healthy Sister    Healthy Brother    Stomach cancer Neg Hx    Esophageal cancer Neg Hx    Liver cancer Neg Hx     Social History   Socioeconomic History   Marital status: Married    Spouse name: Not on file   Number of children: 0   Years of education: Not on file   Highest education level: Associate degree: academic program  Occupational History   Occupation: [employer]  Tobacco Use   Smoking status: Former    Years: 30.00    Types: Cigarettes    Quit date: 10/15/1996    Years since quitting: 24.1   Smokeless tobacco: Never   Vaping Use   Vaping Use: Never used  Substance and Sexual Activity   Alcohol use: No    Alcohol/week: 0.0 standard drinks    Comment: former alcohol abuse/ 1 year ago   Drug use: No   Sexual activity: Yes  Other Topics Concern   Not on file  Social History Narrative   2 caffeine drinks daily         Social Determinants of Health   Financial Resource Strain: Not on file  Food Insecurity: Not on file  Transportation Needs: Not on file  Physical Activity: Not on file  Stress: Not on file  Social Connections: Not on file  Intimate Partner Violence: Not on file     PHYSICAL EXAM:  VS: Ht 5\' 7"  (1.702 m)   Wt 159 lb (72.1 kg)   BMI 24.90 kg/m  Physical Exam Gen: NAD, alert, cooperative with exam, well-appearing   ECSWT Note Donna [name] 26-Aug-1950  Procedure: ECSWT Indications: right achilles pain   Procedure Details Consent: Risks of procedure as well as the alternatives  and risks of each were explained to the (patient/caregiver).  Consent for procedure obtained. Time Out: Verified patient identification, verified procedure, site/side was marked, verified correct patient position, special equipment/implants available, medications/allergies/relevent history reviewed, required imaging and test results available.  Performed.  The area was cleaned with iodine and alcohol swabs.    The right achilles was targeted for Extracorporeal shockwave therapy.   Preset:achillodynia Power Level: 90 Frequency: 16 Impulse/cycles: 3000 Head size: large   Session: first   Patient did tolerate procedure well.     ASSESSMENT & PLAN:   Achilles tendinosis of right lower extremity Completed shockwave today

## 2020-11-25 ENCOUNTER — Ambulatory Visit (INDEPENDENT_AMBULATORY_CARE_PROVIDER_SITE_OTHER): Payer: Self-pay | Admitting: Family Medicine

## 2020-11-25 ENCOUNTER — Ambulatory Visit: Payer: Medicare HMO | Admitting: Family Medicine

## 2020-11-25 ENCOUNTER — Encounter: Payer: Self-pay | Admitting: Family Medicine

## 2020-11-25 ENCOUNTER — Other Ambulatory Visit: Payer: Self-pay

## 2020-11-25 DIAGNOSIS — M6788 Other specified disorders of synovium and tendon, other site: Secondary | ICD-10-CM

## 2020-11-25 NOTE — Assessment & Plan Note (Signed)
Completed shockwave today. 

## 2020-11-25 NOTE — Progress Notes (Signed)
Donna [name] - 70 y.o. female MRN [id number]  Date of birth: 13-Aug-1950  SUBJECTIVE:  Including CC & ROS.  No chief complaint on file.   Donna [name] is a 70 y.o. female that is here for shockwave therapy.   Review of Systems See HPI   HISTORY: Past Medical, Surgical, Social, and Family History Reviewed & Updated per EMR.   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   chest - CX3 + 5FU   Squamous cell carcinoma of skin 09/27/2014   left thigh - tx p bx   Substance abuse ([place])     Past Surgical History:  Procedure Laterality Date   ANKLE SURGERY     Bilateral    CATARACT EXTRACTION Bilateral    TUBAL LIGATION      Family History  Problem Relation Age of Onset   Colon cancer Father 86       Survived   Heart disease Father    Heart attack Father    Rheum arthritis Father    Dementia Father    Diabetes Mother    Colon cancer Paternal Grandmother    Colon polyps Paternal Aunt    Pancreatic cancer Paternal Aunt    Rectal cancer Paternal Aunt    Healthy Sister    Healthy Brother    Stomach cancer Neg Hx    Esophageal cancer Neg Hx    Liver cancer Neg Hx     Social History   Socioeconomic History   Marital status: Married    Spouse name: Not on file   Number of children: 0   Years of education: Not on file   Highest education level: Associate degree: academic program  Occupational History   Occupation: [employer]  Tobacco Use   Smoking status: Former    Years: 30.00    Types: Cigarettes    Quit date: 10/15/1996    Years since quitting: 24.1   Smokeless tobacco: Never  Vaping  Use   Vaping Use: Never used  Substance and Sexual Activity   Alcohol use: No    Alcohol/week: 0.0 standard drinks    Comment: former alcohol abuse/ 1 year ago   Drug use: No   Sexual activity: Yes  Other Topics Concern   Not on file  Social History Narrative   2 caffeine drinks daily         Social Determinants of Health   Financial Resource Strain: Not on file  Food Insecurity: Not on file  Transportation Needs: Not on file  Physical Activity: Not on file  Stress: Not on file  Social Connections: Not on file  Intimate Partner Violence: Not on file     PHYSICAL EXAM:  VS: Ht 5\' 7"  (1.702 m)   Wt 159 lb (72.1 kg)   BMI 24.90 kg/m  Physical Exam Gen: NAD, alert, cooperative with exam, well-appearing   ECSWT Note Donna [name] 02-Sep-1950  Procedure: ECSWT Indications: right achilles pain   Procedure Details Consent: Risks of procedure as well as the alternatives  and risks of each were explained to the (patient/caregiver).  Consent for procedure obtained. Time Out: Verified patient identification, verified procedure, site/side was marked, verified correct patient position, special equipment/implants available, medications/allergies/relevent history reviewed, required imaging and test results available.  Performed.  The area was cleaned with iodine and alcohol swabs.    The right achilles  was targeted for Extracorporeal shockwave therapy.   Preset: achillodynia  Power Level: 100 Frequency: 16 Impulse/cycles: 3300 Head size: large  Session: 2nd  Patient did tolerate procedure well.     ASSESSMENT & PLAN:   Achilles tendinosis of right lower extremity Completed shockwave today.

## 2020-12-03 ENCOUNTER — Other Ambulatory Visit: Payer: Self-pay

## 2020-12-03 ENCOUNTER — Encounter: Payer: Self-pay | Admitting: Family Medicine

## 2020-12-03 ENCOUNTER — Ambulatory Visit: Payer: Medicare HMO | Admitting: Family Medicine

## 2020-12-03 ENCOUNTER — Ambulatory Visit: Payer: Self-pay | Admitting: Family Medicine

## 2020-12-03 DIAGNOSIS — M6788 Other specified disorders of synovium and tendon, other site: Secondary | ICD-10-CM

## 2020-12-03 NOTE — Assessment & Plan Note (Signed)
Completed shockwave today.  - f/u in one week.

## 2020-12-03 NOTE — Progress Notes (Signed)
Donna [name] - 70 y.o. female MRN [id number]  Date of birth: 24-Jan-1951  SUBJECTIVE:  Including CC & ROS.  Chief Complaint  Patient presents with   Follow-up    3rd shockwave treatment Rt heel     Donna [name] is a 70 y.o. female that is  here for shockwave therapy.    Review of Systems See HPI   HISTORY: Past Medical, Surgical, Social, and Family History Reviewed & Updated per EMR.   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   chest - CX3 + 5FU   Squamous cell carcinoma of skin 09/27/2014   left thigh - tx p bx   Substance abuse ([place])     Past Surgical History:  Procedure Laterality Date   ANKLE SURGERY     Bilateral    CATARACT EXTRACTION Bilateral    TUBAL LIGATION      Family History  Problem Relation Age of Onset   Colon cancer Father 6       Survived   Heart disease Father    Heart attack Father    Rheum arthritis Father    Dementia Father    Diabetes Mother    Colon cancer Paternal Grandmother    Colon polyps Paternal Aunt    Pancreatic cancer Paternal Aunt    Rectal cancer Paternal Aunt    Healthy Sister    Healthy Brother    Stomach cancer Neg Hx    Esophageal cancer Neg Hx    Liver cancer Neg Hx     Social History   Socioeconomic History   Marital status: Married    Spouse name: Not on file   Number of children: 0   Years of education: Not on file   Highest education level: Associate degree: academic program  Occupational History   Occupation: [employer]  Tobacco Use   Smoking status: Former    Years: 30.00    Types: Cigarettes    Quit date: 10/15/1996     Years since quitting: 24.1   Smokeless tobacco: Never  Vaping Use   Vaping Use: Never used  Substance and Sexual Activity   Alcohol use: No    Alcohol/week: 0.0 standard drinks    Comment: former alcohol abuse/ 1 year ago   Drug use: No   Sexual activity: Yes  Other Topics Concern   Not on file  Social History Narrative   2 caffeine drinks daily         Social Determinants of Health   Financial Resource Strain: Not on file  Food Insecurity: Not on file  Transportation Needs: Not on file  Physical Activity: Not on file  Stress: Not on file  Social Connections: Not on file  Intimate Partner Violence: Not on file     PHYSICAL EXAM:  VS: BP 118/68   Pulse 69   Ht '5\' 7"'$  (1.702 m)   Wt 159 lb (72.1 kg)   SpO2 98%   BMI 24.90 kg/m  Physical Exam Gen: NAD, alert, cooperative with exam, well-appearing  ASSESSMENT & PLAN:   Achilles tendinosis of right lower extremity Completed shockwave today.  - f/u in one week.

## 2020-12-10 ENCOUNTER — Ambulatory Visit: Payer: Medicare HMO | Admitting: Family Medicine

## 2020-12-11 ENCOUNTER — Ambulatory Visit: Payer: Medicare HMO | Admitting: Family Medicine

## 2020-12-12 ENCOUNTER — Encounter: Payer: Self-pay | Admitting: Family Medicine

## 2020-12-12 ENCOUNTER — Ambulatory Visit: Payer: Self-pay | Admitting: Family Medicine

## 2020-12-12 ENCOUNTER — Other Ambulatory Visit: Payer: Self-pay

## 2020-12-12 DIAGNOSIS — M6788 Other specified disorders of synovium and tendon, other site: Secondary | ICD-10-CM

## 2020-12-12 NOTE — Progress Notes (Signed)
Donna [name] - 70 y.o. female MRN [id number]  Date of birth: [birth year]  SUBJECTIVE:  Including CC & ROS.  No chief complaint on file.   Donna [name] is a 70 y.o. female that is  presenting for shockwave therapy.    Review of Systems See HPI   HISTORY: Past Medical, Surgical, Social, and Family History Reviewed & Updated per EMR.   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   chest - CX3 + 5FU   Squamous cell carcinoma of skin 09/27/2014   left thigh - tx p bx   Substance abuse ([place])     Past Surgical History:  Procedure Laterality Date   ANKLE SURGERY     Bilateral    CATARACT EXTRACTION Bilateral    TUBAL LIGATION      Family History  Problem Relation Age of Onset   Colon cancer Father 88       Survived   Heart disease Father    Heart attack Father    Rheum arthritis Father    Dementia Father    Diabetes Mother    Colon cancer Paternal Grandmother    Colon polyps Paternal Aunt    Pancreatic cancer Paternal Aunt    Rectal cancer Paternal Aunt    Healthy Sister    Healthy Brother    Stomach cancer Neg Hx    Esophageal cancer Neg Hx    Liver cancer Neg Hx     Social History   Socioeconomic History   Marital status: Married    Spouse name: Not on file   Number of children: 0   Years of education: Not on file   Highest education level: Associate degree: academic program  Occupational History   Occupation: [employer]  Tobacco Use   Smoking status: Former    Years: 30.00    Types: Cigarettes    Quit date: 10/15/1996    Years since quitting: 24.1   Smokeless tobacco: Never   Vaping Use   Vaping Use: Never used  Substance and Sexual Activity   Alcohol use: No    Alcohol/week: 0.0 standard drinks    Comment: former alcohol abuse/ 1 year ago   Drug use: No   Sexual activity: Yes  Other Topics Concern   Not on file  Social History Narrative   2 caffeine drinks daily         Social Determinants of Health   Financial Resource Strain: Not on file  Food Insecurity: Not on file  Transportation Needs: Not on file  Physical Activity: Not on file  Stress: Not on file  Social Connections: Not on file  Intimate Partner Violence: Not on file     PHYSICAL EXAM:  VS: Ht '5\' 7"'$  (1.702 m)   Wt 159 lb (72.1 kg)   BMI 24.90 kg/m  Physical Exam Gen: NAD, alert, cooperative with exam, well-appearing   ECSWT Note [name] November 02, 1950  Procedure: ECSWT Indications: right achilles pain   Procedure Details Consent: Risks of procedure as well as  the alternatives and risks of each were explained to the (patient/caregiver).  Consent for procedure obtained. Time Out: Verified patient identification, verified procedure, site/side was marked, verified correct patient position, special equipment/implants available, medications/allergies/relevent history reviewed, required imaging and test results available.  Performed.  The area was cleaned with iodine and alcohol swabs.    The right achilles was targeted for Extracorporeal shockwave therapy.   Preset: achillodynia  Power Level: 120 Frequency: 10 Impulse/cycles: 4000 Head size: med  Session: 4th  Patient did tolerate procedure well.     ASSESSMENT & PLAN:   Achilles tendinosis of right lower extremity Completed shockwave today.

## 2020-12-13 NOTE — Assessment & Plan Note (Signed)
Completed shockwave today. 

## 2020-12-16 ENCOUNTER — Ambulatory Visit: Payer: Medicare HMO | Admitting: Family Medicine

## 2020-12-31 DIAGNOSIS — Z1231 Encounter for screening mammogram for malignant neoplasm of breast: Secondary | ICD-10-CM | POA: Diagnosis not present

## 2021-01-01 DIAGNOSIS — G250 Essential tremor: Secondary | ICD-10-CM | POA: Diagnosis not present

## 2021-01-01 DIAGNOSIS — R413 Other amnesia: Secondary | ICD-10-CM | POA: Diagnosis not present

## 2021-01-15 ENCOUNTER — Ambulatory Visit: Payer: Medicare HMO | Admitting: Family Medicine

## 2021-01-16 DIAGNOSIS — H52203 Unspecified astigmatism, bilateral: Secondary | ICD-10-CM | POA: Diagnosis not present

## 2021-01-16 DIAGNOSIS — H35372 Puckering of macula, left eye: Secondary | ICD-10-CM | POA: Diagnosis not present

## 2021-01-16 DIAGNOSIS — Z961 Presence of intraocular lens: Secondary | ICD-10-CM | POA: Diagnosis not present

## 2021-01-28 ENCOUNTER — Encounter: Payer: Self-pay | Admitting: Physician Assistant

## 2021-01-28 ENCOUNTER — Other Ambulatory Visit: Payer: Self-pay

## 2021-01-28 ENCOUNTER — Ambulatory Visit: Payer: Medicare HMO | Admitting: Physician Assistant

## 2021-01-28 DIAGNOSIS — Z1283 Encounter for screening for malignant neoplasm of skin: Secondary | ICD-10-CM

## 2021-01-28 DIAGNOSIS — L570 Actinic keratosis: Secondary | ICD-10-CM

## 2021-01-28 DIAGNOSIS — L820 Inflamed seborrheic keratosis: Secondary | ICD-10-CM

## 2021-01-28 DIAGNOSIS — D485 Neoplasm of uncertain behavior of skin: Secondary | ICD-10-CM

## 2021-01-28 DIAGNOSIS — Z85828 Personal history of other malignant neoplasm of skin: Secondary | ICD-10-CM

## 2021-01-28 DIAGNOSIS — B078 Other viral warts: Secondary | ICD-10-CM | POA: Diagnosis not present

## 2021-01-28 DIAGNOSIS — Z808 Family history of malignant neoplasm of other organs or systems: Secondary | ICD-10-CM

## 2021-01-28 NOTE — Patient Instructions (Signed)

## 2021-01-28 NOTE — Progress Notes (Signed)
   Follow-Up Visit   Subjective  Donna [name] is a 70 y.o. female who presents for the following: Annual Exam (Patient here today for yearly skin check. Patient has three lesions on her left x few months per patient the lesions are itchy, scaly, and bleeding with shaving. Checked lesions posterior right upper leg x few months per patient the lesions are itchy, scaly and bleeding with shaving. Patient also has lesions on her back x months per patient no bleeding, there is itching. Check lesion on left flank x few months that's getting larger. Personal history of non mole skin cancer. Family history of melanoma) and Skin Problem.   The following portions of the chart were reviewed this encounter and updated as appropriate:  Tobacco  Allergies  Meds  Problems  Med Hx  Surg Hx  Fam Hx      Objective  Well appearing patient in no apparent distress; mood and affect are within normal limits.  A full examination was performed including scalp, head, eyes, ears, nose, lips, neck, chest, axillae, abdomen, back, buttocks, bilateral upper extremities, bilateral lower extremities, hands, feet, fingers, toes, fingernails, and toenails. All findings within normal limits unless otherwise noted below.  Left Flank Raised pink papule     Head - Anterior (Face) (5) Erythematous patches with gritty scale.  Left Lower Leg - Anterior (5), Right Lower Leg - Anterior (5) Erythematous stuck-on, waxy plaque.    Assessment & Plan  Neoplasm of uncertain behavior of skin Left Flank  Skin / nail biopsy Type of biopsy: tangential   Informed consent: discussed and consent obtained   Timeout: patient name, date of birth, surgical site, and procedure verified   Anesthesia: the lesion was anesthetized in a standard fashion   Anesthetic:  1% lidocaine w/ epinephrine 1-100,000 local infiltration Instrument used: flexible razor blade   Hemostasis achieved with: ferric subsulfate   Outcome: patient  tolerated procedure well   Post-procedure details: wound care instructions given    Specimen 1 - Surgical pathology Differential Diagnosis: Skin tag  Check Margins: No  AK (actinic keratosis) (5) Head - Anterior (Face)  Destruction of lesion - Head - Anterior (Face) Complexity: simple   Destruction method: cryotherapy   Informed consent: discussed and consent obtained   Timeout:  patient name, date of birth, surgical site, and procedure verified Lesion destroyed using liquid nitrogen: Yes   Cryotherapy cycles:  1 Outcome: patient tolerated procedure well with no complications   Post-procedure details: wound care instructions given    Inflamed seborrheic keratosis Left Lower Leg - Anterior (5); Right Lower Leg - Anterior (5)  Destruction of lesion - Left Lower Leg - Anterior, Right Lower Leg - Anterior Complexity: simple   Destruction method: cryotherapy   Informed consent: discussed and consent obtained   Timeout:  patient name, date of birth, surgical site, and procedure verified Lesion destroyed using liquid nitrogen: Yes   Cryotherapy cycles:  1 Outcome: patient tolerated procedure well with no complications   Post-procedure details: wound care instructions given      I, Jasminemarie Sherrard, PA-C, have reviewed all documentation's for this visit.  The documentation on 01/28/21 for the exam, diagnosis, procedures and orders are all accurate and complete.

## 2021-02-02 ENCOUNTER — Emergency Department (HOSPITAL_BASED_OUTPATIENT_CLINIC_OR_DEPARTMENT_OTHER): Payer: Medicare HMO

## 2021-02-02 ENCOUNTER — Encounter (HOSPITAL_BASED_OUTPATIENT_CLINIC_OR_DEPARTMENT_OTHER): Payer: Self-pay

## 2021-02-02 ENCOUNTER — Emergency Department (HOSPITAL_BASED_OUTPATIENT_CLINIC_OR_DEPARTMENT_OTHER)
Admission: EM | Admit: 2021-02-02 | Discharge: 2021-02-02 | Disposition: A | Discharge status: Home / Self Care | Payer: Medicare HMO | Attending: Emergency Medicine | Admitting: Emergency Medicine

## 2021-02-02 ENCOUNTER — Other Ambulatory Visit: Payer: Self-pay

## 2021-02-02 DIAGNOSIS — R519 Headache, unspecified: Secondary | ICD-10-CM | POA: Diagnosis not present

## 2021-02-02 DIAGNOSIS — Z87891 Personal history of nicotine dependence: Secondary | ICD-10-CM | POA: Insufficient documentation

## 2021-02-02 DIAGNOSIS — Z85828 Personal history of other malignant neoplasm of skin: Secondary | ICD-10-CM | POA: Diagnosis not present

## 2021-02-02 DIAGNOSIS — H539 Unspecified visual disturbance: Secondary | ICD-10-CM | POA: Diagnosis not present

## 2021-02-02 LAB — BASIC METABOLIC PANEL
Anion gap: 7 (ref 5–15)
BUN: 12 mg/dL (ref 8–23)
CO2: 25 mmol/L (ref 22–32)
Calcium: 8.9 mg/dL (ref 8.9–10.3)
Chloride: 103 mmol/L (ref 98–111)
Creatinine, Ser: 0.74 mg/dL (ref 0.44–1.00)
GFR, Estimated: >60 mL/min (ref >60)
Glucose, Bld: 118 mg/dL — ABNORMAL HIGH (ref 70–99)
Potassium: 3.9 mmol/L (ref 3.5–5.1)
Sodium: 135 mmol/L (ref 135–145)

## 2021-02-02 LAB — CBC
HCT: 34.5 % — ABNORMAL LOW (ref 36.0–46.0)
Hemoglobin: 11.7 g/dL — ABNORMAL LOW (ref 12.0–15.0)
MCH: 29.8 pg (ref 26.0–34.0)
MCHC: 33.9 g/dL (ref 30.0–36.0)
MCV: 88 fL (ref 80.0–100.0)
Platelets: 230 10*3/uL (ref 150–400)
RBC: 3.92 MIL/uL (ref 3.87–5.11)
RDW: 12.6 % (ref 11.5–15.5)
WBC: 3.6 10*3/uL — ABNORMAL LOW (ref 4.0–10.5)
nRBC: 0 % (ref 0.0–0.2)

## 2021-02-02 MED ORDER — ACETAMINOPHEN 500 MG PO TABS
1000.0000 mg | ORAL_TABLET | Freq: Once | ORAL | Status: AC
  Administered 2021-02-02: 1000 mg via ORAL
  Filled 2021-02-02: qty 2

## 2021-02-02 MED ORDER — DIPHENHYDRAMINE HCL 50 MG/ML IJ SOLN
25.0000 mg | Freq: Once | INTRAMUSCULAR | Status: AC
  Administered 2021-02-02: 25 mg via INTRAVENOUS
  Filled 2021-02-02: qty 1

## 2021-02-02 MED ORDER — PROCHLORPERAZINE EDISYLATE 10 MG/2ML IJ SOLN
10.0000 mg | Freq: Once | INTRAMUSCULAR | Status: AC
  Administered 2021-02-02: 10 mg via INTRAVENOUS
  Filled 2021-02-02: qty 2

## 2021-02-02 MED ORDER — PROCHLORPERAZINE MALEATE 10 MG PO TABS: 10.0000 mg | ORAL_TABLET | Freq: Two times a day (BID) | ORAL | 0 refills | Status: DC | PRN

## 2021-02-02 MED ORDER — SODIUM CHLORIDE 0.9 % IV BOLUS
1000.0000 mL | Freq: Once | INTRAVENOUS | Status: AC
  Administered 2021-02-02: 1000 mL via INTRAVENOUS

## 2021-02-02 MED ORDER — IOHEXOL 350 MG/ML SOLN
100.0000 mL | Freq: Once | INTRAVENOUS | Status: AC | PRN
  Administered 2021-02-02: 100 mL via INTRAVENOUS

## 2021-02-02 NOTE — ED Notes (Signed)
EDP at bedside  

## 2021-02-02 NOTE — ED Provider Notes (Signed)
  Physical Exam  BP 127/72   Pulse (!) 58   Temp 97.9 F (36.6 C) (Oral)   Resp 15   Ht 5\' 7"  (1.702 m)   Wt 72.6 kg   SpO2 98%   BMI 25.06 kg/m   Physical Exam  ED Course/Procedures     Procedures  MDM  Received care of pt from Dr. Sedonia Small. Presents with headache. Reports similar episode of stabbing headache in the past with double vision for which she had seen PCP.   CTA negative for acute abnormality. Given similar episode of headache with photophobia, diplopia in the past and resolution, do not feel she requires admission for TIA work up, transfer for MRI or LP at this time. Recommend PCP/Neurology follow up and outpt MRI.         Gareth Morgan, MD 02/03/21 [phone]

## 2021-02-02 NOTE — ED Triage Notes (Signed)
Headache started around 6 pm last evening.  Denies nausea and vomiting and she states the light hurts her eyes somewhat.

## 2021-02-02 NOTE — ED Provider Notes (Signed)
[facility] Emergency Department Provider Note MRN:  [id number]  Arrival date & time: 02/02/21     Chief Complaint   Headache   History of Present Illness   Donna [name] is a 70 y.o. year-old female with a history of anxiety presenting to the ED with chief complaint of headache.  Sudden onset headache occurring yesterday evening at 6 PM.  Described as a sharp lightening type headache to the left side of the head.  Has happened before but not for a while.  Was diagnosed with a neuralgia or icepick headache in the past.  Pain is associated with visual disturbance, endorsing some vertical double vision, which she is also experienced in the past with these headaches.  Denies issues with speech, no numbness or weakness to the arms or legs, no chest pain or shortness of breath, no abdominal pain, no other complaints.  Symptoms are constant but with intermittent severe episodes, no exacerbating or alleviating factors.  Review of Systems  A complete 10 system review of systems was obtained and all systems are negative except as noted in the HPI and PMH.   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   chest - CX3 + 5FU   Squamous cell carcinoma of skin 09/27/2014   left thigh - tx p bx   Substance abuse ([place])     Past Surgical History:  Procedure Laterality Date   ANKLE SURGERY     Bilateral    CATARACT EXTRACTION Bilateral    TUBAL LIGATION      Family History  Problem Relation Age of Onset   Diabetes Mother    Melanoma Father    Colon cancer Father 49       Survived   Heart disease  Father    Heart attack Father    Rheum arthritis Father    Dementia Father    Healthy Sister    Healthy Brother    Colon polyps Paternal Aunt    Pancreatic cancer Paternal Aunt    Rectal cancer Paternal Aunt    Colon cancer Paternal Grandmother    Stomach cancer Neg Hx    Esophageal cancer Neg Hx    Liver cancer Neg Hx     Social History   Socioeconomic History   Marital status: Married    Spouse name: Not on file   Number of children: 0   Years of education: Not on file   Highest education level: Associate degree: academic program  Occupational History   Occupation: [employer]  Tobacco Use   Smoking status: Former    Years: 30.00    Types: Cigarettes    Quit date: 10/15/1996    Years since quitting: 24.3   Smokeless tobacco: Never  Vaping Use   Vaping Use: Never used  Substance and Sexual Activity   Alcohol use: No   Drug use: No   Sexual activity: Yes  Other Topics Concern   Not on file  Social History Narrative   2 caffeine drinks daily  Social Determinants of Health   Financial Resource Strain: Not on file  Food Insecurity: Not on file  Transportation Needs: Not on file  Physical Activity: Not on file  Stress: Not on file  Social Connections: Not on file  Intimate Partner Violence: Not on file     Physical Exam   Vitals:   02/02/21 0639 02/02/21 0645  BP:  127/72  Pulse:    Resp: 19 15  Temp:    SpO2:  98%    CONSTITUTIONAL: Well-appearing, NAD NEURO:  Alert and oriented x 3, normal and symmetric strength and sensation, normal coordination, normal speech EYES:  eyes equal and reactive ENT/NECK:  no LAD, no JVD CARDIO:   rate, well-perfused, normal S1 and S2 PULM:  CTAB no wheezing or rhonchi GI/GU:  normal bowel sounds, non-distended, non-tender MSK/SPINE:  No gross deformities, no edema SKIN:  no rash, atraumatic PSYCH:  Appropriate speech and behavior  *Additional and/or pertinent findings included in MDM below  Diagnostic and  Interventional Summary    EKG Interpretation  Date/Time:    Ventricular Rate:    PR Interval:    QRS Duration:   QT Interval:    QTC Calculation:   R Axis:     Text Interpretation:         Labs Reviewed  CBC - Abnormal; Notable for the following components:      Result Value   WBC 3.6 (*)    Hemoglobin 11.7 (*)    HCT 34.5 (*)    All other components within normal limits  BASIC METABOLIC PANEL - Abnormal; Notable for the following components:   Glucose, Bld 118 (*)    All other components within normal limits    CT Angio Head W or Wo Contrast  Final Result    CT Angio Neck W and/or Wo Contrast  Final Result      Medications  acetaminophen (TYLENOL) tablet 1,000 mg (1,000 mg Oral Given 02/02/21 0556)  sodium chloride 0.9 % bolus 1,000 mL (1,000 mLs Intravenous New Bag/Given 02/02/21 2505)  diphenhydrAMINE (BENADRYL) injection 25 mg (25 mg Intravenous Given 02/02/21 0558)  prochlorperazine (COMPAZINE) injection 10 mg (10 mg Intravenous Given 02/02/21 0600)  iohexol (OMNIPAQUE) 350 MG/ML injection 100 mL (100 mLs Intravenous Contrast Given 02/02/21 0645)     Procedures  /  Critical Care Procedures  ED Course and Medical Decision Making  I have reviewed the triage vital signs, the nursing notes, and pertinent available records from the EMR.  Listed above are laboratory and imaging tests that I personally ordered, reviewed, and interpreted and then considered in my medical decision making (see below for details).  Favoring migraine type headache, reassuring neurological exam.  Given the sudden onset will obtain CTA imaging to evaluate for subarachnoid hemorrhage.  Signed out to oncoming provider at shift change.  If reassuring CT scan and resolved symptoms, suspect to be a candidate for discharge with neurology follow-up.       Barth Kirks. Sedonia Small, [place] [email or web address]  Final Clinical Impressions(s) / ED  Diagnoses     ICD-10-CM   1. Acute nonintractable headache, unspecified headache type  R51.9       ED Discharge Orders     None        Discharge Instructions Discussed with and Provided to Patient:   Discharge Instructions   None       Maudie Flakes, MD 02/02/21 [phone]

## 2021-02-02 NOTE — ED Notes (Signed)
Verbalized understanding discharge instructions. In no acute distress.   

## 2021-02-03 ENCOUNTER — Encounter: Payer: Self-pay | Admitting: Neurology

## 2021-02-11 DIAGNOSIS — M5126 Other intervertebral disc displacement, lumbar region: Secondary | ICD-10-CM | POA: Diagnosis not present

## 2021-02-11 DIAGNOSIS — R413 Other amnesia: Secondary | ICD-10-CM | POA: Diagnosis not present

## 2021-02-11 DIAGNOSIS — R4789 Other speech disturbances: Secondary | ICD-10-CM | POA: Diagnosis not present

## 2021-02-11 DIAGNOSIS — R200 Anesthesia of skin: Secondary | ICD-10-CM | POA: Diagnosis not present

## 2021-02-11 DIAGNOSIS — M792 Neuralgia and neuritis, unspecified: Secondary | ICD-10-CM | POA: Diagnosis not present

## 2021-02-11 DIAGNOSIS — M47812 Spondylosis without myelopathy or radiculopathy, cervical region: Secondary | ICD-10-CM | POA: Diagnosis not present

## 2021-02-12 ENCOUNTER — Encounter: Payer: Self-pay | Admitting: Gastroenterology

## 2021-02-14 ENCOUNTER — Other Ambulatory Visit: Payer: Self-pay | Admitting: Family Medicine

## 2021-02-14 DIAGNOSIS — R413 Other amnesia: Secondary | ICD-10-CM

## 2021-02-14 DIAGNOSIS — R4789 Other speech disturbances: Secondary | ICD-10-CM

## 2021-02-14 DIAGNOSIS — R200 Anesthesia of skin: Secondary | ICD-10-CM

## 2021-02-18 DIAGNOSIS — M542 Cervicalgia: Secondary | ICD-10-CM | POA: Diagnosis not present

## 2021-02-18 DIAGNOSIS — M47812 Spondylosis without myelopathy or radiculopathy, cervical region: Secondary | ICD-10-CM | POA: Diagnosis not present

## 2021-02-18 DIAGNOSIS — I10 Essential (primary) hypertension: Secondary | ICD-10-CM | POA: Diagnosis not present

## 2021-02-18 DIAGNOSIS — M5412 Radiculopathy, cervical region: Secondary | ICD-10-CM | POA: Diagnosis not present

## 2021-02-18 NOTE — Progress Notes (Signed)
New Patient Note  RE: Donna [name] MRN: [id number] DOB: 04/28/1951 Date of Office Visit: 02/19/2021  Consult requested by: [name], DO Primary care provider: [name], DO  Chief Complaint: Allergy Testing (Patient in today to establish care and be allergy tested. Had allergy testing about 1 year ago and was negative. Will have fill out ROI to obtain records.)  History of Present Illness: I had the pleasure of seeing Donna [name] for initial evaluation at the [facility] on 02/20/2021. She is a 70 y.o. female, who is self-referred here for the evaluation of asthma and allergies.   Patient was seen in by [facility] last year -requesting records.  Asthma:  She reports symptoms of shortness of breath, coughing, wheezing, nocturnal awakenings for 2 years. Current medications include albuterol prn which help. She reports not using aerochamber with inhalers. She tried the following inhalers: none. Main triggers are chemicals. In the last month, frequency of symptoms: 3-4x/week. Frequency of nocturnal symptoms: 0x/month. Frequency of SABA use: 3-4x/week. Interference with physical activity: no. Sleep is undisturbed. In the last 12 months, emergency room visits/urgent care visits/doctor office visits or hospitalizations due to respiratory issues: no. In the last 12 months, oral steroids courses: no. Lifetime history of hospitalization for respiratory issues: no. Prior intubations: no. Asthma was diagnosed in 2021 via positive methacholine challenge test. History of pneumonia: no. She was evaluated by allergist/pulmonologist in the past. Smoking exposure: quit in 1998. Up to date with flu vaccine: no. Up to date with pneumonia vaccine: no. Up to date with COVID-19 vaccine: yes. Prior Covid-19 infection: no. History of reflux: slightly - not taking any medications, saw GI and had EGD done which was normal.  Patient only takes lisinopril once a year when  her blood pressure goes up when she's traveling in higher elevation.  Albuterol causes shaking.  01/17/2020 pulmonology visit: "Patient ID: Donna [name] is a 70 y.o. female who presents for evaluation regarding a persistent cough. She had a positive methacholine challenge. She did not start the flovent and reports that she was concerned about the corticosteroids. She attempted to call our office but then asked the pharmacist and then she decided not to take the inhaler because of concerns of compromising the immune system. She is taking a yukoleptis oil that she steams up herself. She is taking lisinopril once a year or so. She is not wheezing. The cough is sometimes worse at night. She is not taking nose sprays."  Assessment and Plan: Donna [name] is a 70 y.o. female with: Asthma Diagnosed with asthma 1 year ago with positive methacholine challenge test. Saw pulmonology and hesitant about taking Flovent. Using albuterol 3-4 times per week with good benefit. Triggers include strong scents/chemicals. GI work up negative for GERD including EGD. Albuterol causes shaking. Today's spirometry showed: possible restrictive disease with 11% improvement in FEV1 post bronchodilator treatment. Clinically feeling improved.  Daily controller medication(s): Start Alvesco 65mcg 1 puff twice a day with spacer and rinse mouth afterwards. Sample given. Discussed with patient that Alvesco is a prodrug with minimal systemic absorption and side effects.  Spacer prescription given and demonstrated proper use with inhaler. Patient understood technique and all questions/concerned were addressed.  May use levoalbuterol rescue inhaler 2 puffs every 4 to 6 hours as needed for shortness of breath, chest tightness, coughing, and wheezing. May use levoalbuterol rescue inhaler 2 puffs 5 to 15 minutes prior to strenuous physical activities. Monitor frequency of use.  Get  spirometry at next visit.  Chronic rhinitis Rhinoconjunctivitis  symptoms but states she had negative skin testing in 2021.  In process of obtaining records from previous allergist. Use over the counter antihistamines such as Zyrtec (cetirizine), Claritin (loratadine), Allegra (fexofenadine), or Xyzal (levocetirizine) daily as needed. May switch antihistamines every few months. May use nasal saline spray (i.e., Simply Saline) as needed.  Patient hesitant in using medicated nasal sprays.   Gluten intolerance Gluten causes abdominal bloating and vomiting. Continue to avoid gluten. For mild symptoms you can take over the counter antihistamines such as Benadryl and monitor symptoms closely. If symptoms worsen or if you have severe symptoms including breathing issues, throat closure, significant swelling, whole body hives, severe diarrhea and vomiting, lightheadedness then seek immediate medical care.  Multiple drug allergies Continue to avoid drugs on the allergy list.   Return in about 3 months (around 05/22/2021).  Meds ordered this encounter  Medications   levalbuterol (XOPENEX HFA) 45 MCG/ACT inhaler    Sig: Inhale 2 puffs into the lungs every 4 (four) hours as needed for wheezing or shortness of breath (coughings fits).    Dispense:  1 each    Refill:  2    Albuterol caused shaking.   ciclesonide (ALVESCO) 80 MCG/ACT inhaler    Sig: Inhale 1 puff into the lungs 2 (two) times daily. with spacer and rinse mouth afterwards.    Dispense:  1 each    Refill:  3   DISCONTD: Spacer/Aero-Holding Chambers (AEROCHAMBER PLUS) inhaler    Sig: Use as instructed    Dispense:  1 each    Refill:  2   Spacer/Aero-Holding Chambers (AEROCHAMBER PLUS) inhaler    Sig: Use as instructed    Dispense:  1 each    Refill:  2    Lab Orders  No laboratory test(s) ordered today    Other allergy screening: Rhino conjunctivitis: no Itchy eyes, rhinorrhea, nasal congestion. Patient had skin testing in 2021 which was negative per patient report - in process of obtaining  records.   Food allergy:  Currently avoiding gluten due to abdominal bloating and vomiting.  Medication allergy: yes Hymenoptera allergy: no Urticaria: no Eczema:no As a child.  History of recurrent infections suggestive of immunodeficency: no  Diagnostics: Spirometry:  Tracings reviewed. Her effort: It was hard to get consistent efforts and there is a question as to whether this reflects a maximal maneuver. FVC: 2.26L FEV1: 1.68L, 65% predicted FEV1/FVC ratio: 74% Interpretation: Spirometry consistent with possible restrictive disease with 11% improvement in FEV1 post bronchodilator treatment. Clinically feeling improved.   Please see scanned spirometry results for details.  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   Superficial basal cell carcinoma 06/13/2018   Upper airway cough syndrome 06/13/2018   Atrophic vaginitis 02/17/2018   Bilateral leg cramps 02/17/2018   Fatty liver, alcoholic 63/05/6008   Depression 04/19/2016   Withdrawal complaint 04/19/2016   Hx of adenomatous colonic polyps 08/03/2015   Herpes simplex 06/15/2014   GERD (gastroesophageal reflux disease) 10/14/2013   Anxiety state, unspecified 10/14/2013  Recovering alcoholic in remission ([place]) 10/14/2013   Allergic rhinitis 10/14/2013   Adjustment disorder with anxiety 10/14/2013   Elevated low-density lipoprotein level 10/14/2013   Essential hypertension 10/15/2012   Heart murmur 05/25/2012   Past Medical History:  Diagnosis Date   Anxiety     Basal cell carcinoma 06/29/2017   right lip - CX3 + 5FU   Cataract    bilateral lens implants   Cough variant asthma    Depression    GERD (gastroesophageal reflux disease)    Heart murmur    Hiatal hernia    Hyperlipidemia    IBS (irritable bowel syndrome)    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       calcium gluconate 500 MG tablet Take 500 mg by mouth daily.       ciclesonide (ALVESCO) 80 MCG/ACT inhaler Inhale 1 puff into the lungs 2 (two) times daily. with spacer and rinse mouth afterwards. 1 each 3   estradiol (ESTRACE) 0.1 MG/GM vaginal cream SMARTSIG:1 Applicator Vaginal Once a Week     levalbuterol (XOPENEX HFA) 45 MCG/ACT inhaler Inhale 2 puffs into the lungs every 4 (four) hours as needed for wheezing or shortness of breath (coughings fits). 1 each 2   lisinopril (ZESTRIL) 5 MG tablet TAKE 1 TABLET BY MOUTH DAILY FOR ELEVATED BP WHILE TRAVELING 90 tablet 1   Loratadine (CLARITIN PO) Take by mouth.     Magnesium 500 MG TABS Take 1 tablet by mouth daily.       Multiple Vitamin (MULTIVITAMIN) capsule Take 1 capsule by mouth daily.       prochlorperazine (COMPAZINE) 10 MG tablet Take 1 tablet (10 mg total) by mouth 2 (two) times daily as needed for nausea or vomiting. 10 tablet 0   propranolol (INDERAL) 10 MG tablet Take 10 mg by mouth 3 (three) times daily.     valACYclovir (VALTREX) 500 MG tablet ONE BY MOUTH TWICE A DAY X 5 DAYS FOR HERPES OUTBREAK 10 tablet prn   Spacer/Aero-Holding Chambers (AEROCHAMBER PLUS) inhaler Use as  instructed 1 each 2   No current facility-administered medications for this visit.   Allergies: Allergies  Allergen Reactions   Ciprofloxacin Other (See Comments)    Neuropathy    Doxycycline Nausea Only   Gluten Meal     GI upset    Penicillins Nausea And Vomiting    "breaking out"   Quinolones Other (See Comments)    REACTION: sick / neuropathy    Social History: Social History   Socioeconomic History   Marital status: Married    Spouse name: Not on file   Number of children: 0   Years of education: Not on file   Highest education level: Associate degree: academic program  Occupational History   Occupation: [employer]  Tobacco Use   Smoking status: Former    Years: 30.00    Types: Cigarettes    Quit date: 10/15/1996    Years since quitting: 24.3  Smokeless tobacco: Never  Vaping Use   Vaping Use: Never used  Substance and Sexual Activity   Alcohol use: No   Drug use: No   Sexual activity: Yes  Other Topics Concern   Not on file  Social History Narrative   2 caffeine drinks daily         Social Determinants of Health   Financial Resource Strain: Not on file  Food Insecurity: Not on file  Transportation Needs: Not on file  Physical Activity: Not on file  Stress: Not on file  Social Connections: Not on file   Lives in a house built in [place]. Smoking: quit in 1998 Occupation: yoga instructor  Environmental History: Water Damage/mildew in the house: no Carpet in the family room: no Carpet in the bedroom: no Heating: electric Cooling: central Pet: yes 1 cat x 11 yrs  Family History: Family History  Problem Relation Age of Onset   Diabetes Mother    Melanoma Father    Colon cancer Father 21       Survived   Heart disease Father    Heart attack Father    Rheum arthritis Father    Dementia Father    Healthy Sister    Healthy Brother    Colon polyps Paternal Aunt    Pancreatic cancer Paternal Aunt    Rectal cancer Paternal Aunt    Colon cancer  Paternal Grandmother    Stomach cancer Neg Hx    Esophageal cancer Neg Hx    Liver cancer Neg Hx    Problem                               Relation Asthma                                   No  Eczema                                Father  Food allergy                          No  Allergic rhino conjunctivitis     No  Review of Systems  Constitutional:  Negative for appetite change, chills, fever and unexpected weight change.  HENT:  Negative for congestion and rhinorrhea.   Eyes:  Positive for itching.  Respiratory:  Positive for cough, shortness of breath and wheezing.   Cardiovascular:  Negative for chest pain.  Gastrointestinal:  Negative for abdominal pain.  Genitourinary:  Negative for difficulty urinating.  Skin:  Negative for rash.  Neurological:  Negative for headaches.   Objective: BP 140/78   Pulse 68   Temp 97.9 F (36.6 C) (Temporal)   Resp 16   Ht 5\' 7"  (1.702 m)   Wt 161 lb 12.8 oz (73.4 kg)   SpO2 97%   BMI 25.34 kg/m  Body mass index is 25.34 kg/m. Physical Exam Vitals and nursing note reviewed.  Constitutional:      Appearance: Normal appearance. She is well-developed.  HENT:     Head: Normocephalic and atraumatic.     Right Ear: Tympanic membrane and external ear normal.     Left Ear: Tympanic membrane and external ear normal.     Nose: Nose normal.  Mouth/Throat:     Mouth: Mucous membranes are moist.     Pharynx: Oropharynx is clear.  Eyes:     Conjunctiva/sclera: Conjunctivae normal.  Cardiovascular:     Rate and Rhythm: Normal rate and regular rhythm.     Heart sounds: Normal heart sounds. No murmur heard.   No friction rub. No gallop.  Pulmonary:     Effort: Pulmonary effort is normal.     Breath sounds: Normal breath sounds. No wheezing, rhonchi or rales.  Musculoskeletal:     Cervical back: Neck supple.  Skin:    General: Skin is warm.     Findings: No rash.  Neurological:     Mental Status: She is alert and oriented to person,  place, and time.  Psychiatric:        Behavior: Behavior normal.   The plan was reviewed with the patient/family, and all questions/concerned were addressed.  It was my pleasure to see Donna [name] today and participate in her care. Please feel free to contact me with any questions or concerns.  Sincerely,  Rexene Alberts, DO Allergy & Immunology  [facility] office: [place] office: [phone]

## 2021-02-19 ENCOUNTER — Encounter: Payer: Self-pay | Admitting: Allergy

## 2021-02-19 ENCOUNTER — Other Ambulatory Visit: Payer: Self-pay

## 2021-02-19 ENCOUNTER — Ambulatory Visit: Payer: Medicare HMO | Admitting: Allergy

## 2021-02-19 VITALS — BP 140/78 | HR 68 | Temp 97.9°F | Resp 16 | Ht 67.0 in | Wt 161.8 lb

## 2021-02-19 DIAGNOSIS — J45909 Unspecified asthma, uncomplicated: Secondary | ICD-10-CM

## 2021-02-19 DIAGNOSIS — Z889 Allergy status to unspecified drugs, medicaments and biological substances status: Secondary | ICD-10-CM

## 2021-02-19 DIAGNOSIS — J310 Chronic rhinitis: Secondary | ICD-10-CM | POA: Diagnosis not present

## 2021-02-19 DIAGNOSIS — K9041 Non-celiac gluten sensitivity: Secondary | ICD-10-CM | POA: Diagnosis not present

## 2021-02-19 MED ORDER — AEROCHAMBER PLUS MISC: 2 refills | Status: DC

## 2021-02-19 MED ORDER — ALVESCO 80 MCG/ACT IN AERS: 1.0000 | INHALATION_SPRAY | Freq: Two times a day (BID) | RESPIRATORY_TRACT | 3 refills | Status: DC

## 2021-02-19 MED ORDER — LEVALBUTEROL TARTRATE 45 MCG/ACT IN AERO: 2.0000 | INHALATION_SPRAY | RESPIRATORY_TRACT | 2 refills | Status: DC | PRN

## 2021-02-19 NOTE — Patient Instructions (Addendum)
Requesting records from previous allergist.   Asthma:  Daily controller medication(s): Start Alvesco 31mcg 1 puff twice a day with spacer and rinse mouth afterwards. Sample given.  Spacer prescription given and demonstrated proper use with inhaler. Patient understood technique and all questions/concerned were addressed.  May use levoalbuterol rescue inhaler 2 puffs every 4 to 6 hours as needed for shortness of breath, chest tightness, coughing, and wheezing. May use levoalbuterol rescue inhaler 2 puffs 5 to 15 minutes prior to strenuous physical activities. Monitor frequency of use.  Asthma control goals:  Full participation in all desired activities (may need albuterol before activity) Albuterol use two times or less a week on average (not counting use with activity) Cough interfering with sleep two times or less a month Oral steroids no more than once a year No hospitalizations   Rhinitis: Use over the counter antihistamines such as Zyrtec (cetirizine), Claritin (loratadine), Allegra (fexofenadine), or Xyzal (levocetirizine) daily as needed. May switch antihistamines every few months. May use nasal saline spray (i.e., Simply Saline) as needed.   Food: Continue to avoid gluten. For mild symptoms you can take over the counter antihistamines such as Benadryl and monitor symptoms closely. If symptoms worsen or if you have severe symptoms including breathing issues, throat closure, significant swelling, whole body hives, severe diarrhea and vomiting, lightheadedness then seek immediate medical care.  Drug allergies: Continue to avoid drugs on the allergy list.   Follow up in 3 months or sooner if needed.

## 2021-02-20 ENCOUNTER — Encounter: Payer: Self-pay | Admitting: Allergy

## 2021-02-20 NOTE — Assessment & Plan Note (Signed)
.   Continue to avoid drugs on the allergy list.

## 2021-02-20 NOTE — Assessment & Plan Note (Signed)
Rhinoconjunctivitis symptoms but states she had negative skin testing in 2021.  In process of obtaining records from previous allergist. . Use over the counter antihistamines such as Zyrtec (cetirizine), Claritin (loratadine), Allegra (fexofenadine), or Xyzal (levocetirizine) daily as needed. May switch antihistamines every few months. . May use nasal saline spray (i.e., Simply Saline) as needed.  . Patient hesitant in using medicated nasal sprays.

## 2021-02-20 NOTE — Assessment & Plan Note (Signed)
Gluten causes abdominal bloating and vomiting. . Continue to avoid gluten. . For mild symptoms you can take over the counter antihistamines such as Benadryl and monitor symptoms closely. If symptoms worsen or if you have severe symptoms including breathing issues, throat closure, significant swelling, whole body hives, severe diarrhea and vomiting, lightheadedness then seek immediate medical care.

## 2021-02-20 NOTE — Assessment & Plan Note (Addendum)
Diagnosed with asthma 1 year ago with positive methacholine challenge test. Saw pulmonology and hesitant about taking Flovent. Using albuterol 3-4 times per week with good benefit. Triggers include strong scents/chemicals. GI work up negative for GERD including EGD. Albuterol causes shaking.  Today's spirometry showed: possible restrictive disease with 11% improvement in FEV1 post bronchodilator treatment. Clinically feeling improved.  . Daily controller medication(s): Start Alvesco 71mcg 1 puff twice a day with spacer and rinse mouth afterwards. o Sample given. Discussed with patient that Alvesco is a prodrug with minimal systemic absorption and side effects.  Marland Kitchen Spacer prescription given and demonstrated proper use with inhaler. Patient understood technique and all questions/concerned were addressed.  . May use levoalbuterol rescue inhaler 2 puffs every 4 to 6 hours as needed for shortness of breath, chest tightness, coughing, and wheezing. May use levoalbuterol rescue inhaler 2 puffs 5 to 15 minutes prior to strenuous physical activities. Monitor frequency of use.  . Get spirometry at next visit.

## 2021-02-25 ENCOUNTER — Encounter: Payer: Self-pay | Admitting: Allergy

## 2021-02-25 NOTE — Progress Notes (Signed)
Reviewed notes from [facility].  See scanned notes for full documentation. Negative skin and intradermal testing in 2020.

## 2021-03-06 ENCOUNTER — Other Ambulatory Visit: Payer: Self-pay

## 2021-03-06 ENCOUNTER — Telehealth: Payer: Self-pay | Admitting: Allergy

## 2021-03-06 ENCOUNTER — Ambulatory Visit
Admission: RE | Admit: 2021-03-06 | Discharge: 2021-03-06 | Disposition: A | Discharge status: Home / Self Care | Payer: Medicare HMO | Source: Ambulatory Visit | Attending: Family Medicine | Admitting: Family Medicine

## 2021-03-06 DIAGNOSIS — R4789 Other speech disturbances: Secondary | ICD-10-CM

## 2021-03-06 DIAGNOSIS — I6782 Cerebral ischemia: Secondary | ICD-10-CM | POA: Diagnosis not present

## 2021-03-06 DIAGNOSIS — R413 Other amnesia: Secondary | ICD-10-CM | POA: Diagnosis not present

## 2021-03-06 DIAGNOSIS — R200 Anesthesia of skin: Secondary | ICD-10-CM | POA: Diagnosis not present

## 2021-03-06 MED ORDER — GADOBENATE DIMEGLUMINE 529 MG/ML IV SOLN
15.0000 mL | Freq: Once | INTRAVENOUS | Status: AC | PRN
  Administered 2021-03-06: 15 mL via INTRAVENOUS

## 2021-03-06 NOTE — Telephone Encounter (Signed)
Patient called and said her [facility] and she said that she uses [facility]. But I see on system that they sent it to [facility] in [place], but [facility] here call her. Please see what is going on . [phone]

## 2021-03-06 NOTE — Addendum Note (Signed)
Addended by: Clovis Cao A on: 03/06/2021 05:21 PM   Modules accepted: Orders

## 2021-03-06 NOTE — Telephone Encounter (Signed)
Called patient in regards to stopping the Alvesco inhaler. She verbalized understanding that we will wait till her next office visit before starting her on a new inhaler. She plans to continue using the Albuterol inhaler as needed for cough, wheeze, or shortness of breath.

## 2021-03-06 NOTE — Telephone Encounter (Signed)
Please call patient back.  Tell her to stop alvesco. Alvesco script was sent to [facility] in [place] but she can ignore that script as we are not going to be using the inhaler.   We'll see how she is doing at next visit before prescribing any new inhalers.   May use albuterol rescue inhaler 2 puffs or nebulizer every 4 to 6 hours as needed for shortness of breath, chest tightness, coughing, and wheezing. Monitor frequency of use.

## 2021-04-07 ENCOUNTER — Telehealth: Payer: Self-pay | Admitting: Physician Assistant

## 2021-04-07 NOTE — Telephone Encounter (Signed)
Patient is calling to see if there is anything that she can try to put on an itchy spot on her leg until her January 2023 appointment.  Patient will keep trying for a cancellation and she has bee seen for this earlier.

## 2021-04-07 NOTE — Telephone Encounter (Signed)
Phone call to patient to see if she's tried OTC hydrocortisone for the itchy place?  Patient states that she has tried OTC hydrocortisone and it's hasn't helped. While on the phone with patient I asked her if she was okay with seeing Dr. Denna Haggard because he had an opening on 04/17/2021.  Patient stated that she was okay seeing Dr. Denna Haggard, appointment scheduled.

## 2021-04-08 ENCOUNTER — Ambulatory Visit: Payer: Medicare HMO | Admitting: Sports Medicine

## 2021-04-08 VITALS — BP 128/72 | Ht 67.0 in | Wt 160.0 lb

## 2021-04-08 DIAGNOSIS — Q667 Congenital pes cavus, unspecified foot: Secondary | ICD-10-CM

## 2021-04-08 DIAGNOSIS — M7742 Metatarsalgia, left foot: Secondary | ICD-10-CM | POA: Diagnosis not present

## 2021-04-08 NOTE — Progress Notes (Signed)
  Donna [name] - 70 y.o. female MRN [id number]  Date of birth: 06-23-1950  SUBJECTIVE:   CC: L foot pain  Donna [name] is a 70 yo female with PMHx of L lateral ankle ligament repair who presents with L foot pain. Patient reports her pain started in May 2022 and has progressively worsened. She has sharp burning pain in the medial forefoot that is worse when waking up in the morning and with dorsiflexion. 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 She saw [facility] Tamala Julian at [facility] who recommended strengthening exercises and stretching though has not seen improvement. She is not taking OTC meds for pain, Voltaren gel has not helped, bracing the foot has not helped.  ROS: Negative aside from above  PHYSICAL EXAM:  VS: BP:128/72  HR: bpm  TEMP: ( )  RESP:   HT:5\' 7"  (170.2 cm)   WT:160 lb (72.6 kg)  BMI:25.05 PHYSICAL EXAM: Gen: NAD, alert, cooperative with exam, well-appearing HEENT: clear conjunctiva, nontraumatic CV:  no peripheral edema, extremities well perfused Resp: non-labored breathing on room air Skin: no rashes, normal turgor  Neuro: no gross deficits  Psych:  alert and oriented MSK: Cavus foot deformity L > R. No skin changes or swelling. Tenderness to palpation medial forefoot. Strength intact. Full ankle ROM. Normal ankle laxity.  ASSESSMENT & PLAN:   Donna [name] is a 70 yo female with PMHx of L lateral ankle ligament repair who presents with L foot pain. Given her significant cavus deformity of the L foot and the midfoot pain she is experiencing, she has likely developed metatarsalgia of the midfoot caused by stretching or irritation of the spring ligament (calcaneonavicular ligament) which supports her high arch.  Plan: -New sports inserts with arch support -Arch straps -Continue to exercise, limit yoga poses and exercises that worsen pain -Can consider OTC antiinflammatory pain medication though patient prefers not to take additional  medications unless pain becomes more bothersome. -Follow up as needed  Portions of this report may have been transcribed using voice recognition software. Every effort was made to ensure accuracy; however, inadvertent computerized transcription errors may be present.   Wayland Denis, MD 04/08/21,  7:23 PM  Patient seen and evaluated with the resident.  I agree with the above plan of care.  She actually has her old green sports insoles with her today.  They are quite worn.  We replaced those for her today and given her an arch strap to wear.  We also discussed the possibility of custom orthotics to better support her cavus foot if symptoms do not improve.  She will follow-up for ongoing or recalcitrant issues. Pager: [phone] Internal Medicine Resident, PGY-1 [facility]

## 2021-04-09 ENCOUNTER — Other Ambulatory Visit: Payer: Self-pay | Admitting: Allergy

## 2021-04-09 MED ORDER — ALVESCO 80 MCG/ACT IN AERS: 1.0000 | INHALATION_SPRAY | Freq: Two times a day (BID) | RESPIRATORY_TRACT | 3 refills | Status: DC

## 2021-04-09 NOTE — Telephone Encounter (Signed)
Spoke with patient, informed her of Dr. Julianne Rice recommendation. Patient verbalized understanding. I did give her the [facility] number in case she doesn't hear from them by tomorrow. Patient will call if she has any problems.

## 2021-04-09 NOTE — Telephone Encounter (Signed)
Please advise change in inhaler. Thank You.

## 2021-04-09 NOTE — Telephone Encounter (Signed)
Patient called back today, 04/09/21. She said she is coughing a lot and would like to re start Alvesco. She said she has one at home, but not sure if it has expired. She would like a new prescription. [facility] [place], [place].

## 2021-04-09 NOTE — Telephone Encounter (Signed)
Look at phone encounter 03/06/21.  Patient did not want to start Flovent per the last visit.  Are there any samples of Alvesco in the [place] office? - if yes, can you set aside one for the patient and ask her to pick it up in our office and I will send the Rx to [facility] in [place].

## 2021-04-09 NOTE — Telephone Encounter (Signed)
Please advise to restarting of the alvesco for pt

## 2021-04-09 NOTE — Addendum Note (Signed)
Addended by: Garnet Sierras on: 04/09/2021 04:53 PM   Modules accepted: Orders

## 2021-04-09 NOTE — Telephone Encounter (Signed)
Please call patient back.   Is she using her rescue inhaler for the coughing?   I can send in [place] to her local pharmacy but not sure if it will be covered. If not covered then let us know.  Usually I send this inhaler to the [facility] in [place] and they mail the inhaler.   If she notices the symptoms that she complained of last month that made her stop the inhaler then let us.  Her complaints per note was "[place] was causing discomfort such as headaches, her coughing was worse, minor nose bleeds, and she experienced chest pain during a minor walk"  She can also schedule a follow up for December if needed. Currently scheduled in January.  Thank you.

## 2021-04-13 NOTE — Progress Notes (Signed)
NEUROLOGY CONSULTATION NOTE  Donna [name] MRN: [id number] DOB: Nov 11, 1950  Referring provider: Gareth Morgan, MD (ED referral) Primary care provider: [name], DO  Reason for consult:  headaches, double vision  Assessment/Plan:   Left sided cervical neuralgia/cervicogenic headache - isolated flare up, possibly triggered by neck position at the computer.   Cerebrovascular disease Memory deficits - she does exhibit very mild cognitive deficits which may be due to underlying anxiety or cerebrovascular disease.  I don't think she is exhibiting signs of early Alzheimer's disease, although given family history, would need to monitor.  1  Start ASA 81mg  daily given cerebrovascular disease 2  Monitor memory - if it gets worse, return for re-evaluation 3  Mediterranean diet 4  Routine exercise 5  Follow up as needed   Subjective:  Donna [name] is a 70 year old female with essential tremor, anxiety, depression, IBS and history of BCC who presents for headache and double vision.  History supplemented by ED note.  CTA head and neck on 02/02/2021, MRI brain on 03/10/2021 and MRI C-spine on 07/10/2018 personally reviewed.  She started having occipital headaches around 2009.  It is a severe sharp pain from the left side of neck radiating up to top of the left ear and down into the left shoulder.  It is typically paroxysmal and would occur every 30 seconds.  It would calm down but return every 6 months.  MRI of cervical spine on 07/10/2018 showed advanced left-sided facet arthrosis with reactive marrow edema at C3-4, mild disc bulge at C5-6 causing mild to moderate left greater than right C6 foraminal narrowing, and disc bulge causing mild left C7 foraminal stenosis.  She subsequently underwent left C3-C6 ablation in 2020.  She was pain-free until she had a recurrence on 02/02/2021 which was more severe and persistent than in the past.  It occurred during prolonged time sitting at the computer.   She was seen in the ED.  CTA of head and neck showed generalized arterial tortuosity in the upper chest and neck, moderate bilateral ICA siphon calcified plaque without significant stenosis and proximal left subclavian artery plaque causing up to 50% stenosis.  She later had an outpatient MRI of brain with and without contrast on 03/10/2021 which showed mild chronic small vessel ischemic changes but otherwise unremarkable.    Since the ED visit, she hasn't had a recurrence.  For the past couple of years, she reports increased memory problems.  She has more frequent difficulty with word-recall and forgets things that she just read.  She reports a lot of stress since onset of the COVID pandemic.  She had been caring for both parents with dementia.  Her father subsequently passed away.  TSH and B12 were unremarkable.   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       calcium gluconate 500 MG tablet Take 500 mg by mouth daily.       ciclesonide (ALVESCO) 80 MCG/ACT inhaler Inhale 1 puff into the lungs 2 (two) times daily. with spacer and rinse mouth afterwards. 1 each 3   estradiol (ESTRACE) 0.1 MG/GM  vaginal cream SMARTSIG:1 Applicator Vaginal Once a Week     levalbuterol (XOPENEX HFA) 45 MCG/ACT inhaler Inhale 2 puffs into the lungs every 4 (four) hours as needed for wheezing or shortness of breath (coughings fits). 1 each 2   lisinopril (ZESTRIL) 5 MG tablet TAKE 1 TABLET BY MOUTH DAILY FOR ELEVATED BP WHILE TRAVELING 90 tablet 1   Loratadine (CLARITIN PO) Take by mouth.     Magnesium 500 MG TABS Take 1 tablet by mouth daily.       Multiple Vitamin (MULTIVITAMIN) capsule Take 1 capsule by mouth daily.       prochlorperazine (COMPAZINE) 10 MG tablet Take 1 tablet (10 mg total) by mouth 2 (two) times daily as needed for nausea or vomiting. 10 tablet 0   propranolol (INDERAL) 10 MG tablet Take 10 mg by mouth 3 (three) times daily.     Spacer/Aero-Holding Chambers (AEROCHAMBER PLUS) inhaler Use as instructed 1 each 2   valACYclovir (VALTREX) 500 MG tablet ONE BY MOUTH TWICE A DAY X 5 DAYS FOR HERPES OUTBREAK 10 tablet prn   No current facility-administered medications on file prior to visit.    ALLERGIES: Allergies  Allergen Reactions   Ciprofloxacin Other (See Comments)    Neuropathy    Doxycycline Nausea Only   Gluten Meal     GI upset    Penicillins Nausea And Vomiting    "breaking out"   Quinolones Other (See Comments)    REACTION: sick / neuropathy     FAMILY HISTORY: Family History  Problem Relation Age of Onset   Diabetes Mother    Melanoma Father    Colon cancer Father 17       Survived   Heart disease Father    Heart attack Father    Rheum arthritis Father    Dementia Father    Healthy Sister    Healthy Brother    Colon polyps Paternal Aunt    Pancreatic cancer Paternal Aunt    Rectal cancer Paternal Aunt    Colon cancer Paternal Grandmother    Stomach cancer Neg Hx    Esophageal cancer Neg Hx    Liver cancer Neg Hx     Objective:  Blood pressure (!) 145/82, pulse 60, height 5\' 7"  (1.702 m), weight 162 lb 9.6 oz (73.8 kg), SpO2 96 %. General: No acute  distress.  Patient appears well-groomed.   Head:  Normocephalic/atraumatic Eyes:  fundi examined but not visualized Neck: supple, left paraspinal tenderness, full range of motion Back: No paraspinal tenderness Heart: regular rate and rhythm Lungs: Clear to auscultation bilaterally. Vascular: No carotid bruits. Neurological Exam: Mental status:  [employer] Mental Exam 04/14/2021  Weekday Correct 1  Current year 1  What state are we in? 1  Amount spent 1  Amount left 2  # of Animals 3  5 objects recall 4  Number series  1  Hour markers 2  Time correct 2  Placed X in triangle correctly 1  Largest Figure 1  Name of female 2  Date back to work 0  Type of work 2  State she lived in 0  Total score 24   speech fluent and not dysarthric, language intact. Cranial nerves: CN I: not tested CN II: pupils equal, round and reactive to light, visual fields intact CN III, IV, VI:  full range of motion, no nystagmus, no ptosis CN V: facial sensation intact. CN VII: upper and lower face symmetric CN VIII: hearing intact CN IX, X: gag intact, uvula midline CN XI: sternocleidomastoid and trapezius muscles intact CN XII: tongue midline Bulk & Tone: normal, no fasciculations. Motor:  muscle strength 5/5 throughout Sensation:  Pinprick, temperature and vibratory sensation intact. Deep Tendon Reflexes:  2+ throughout,  toes downgoing.   Finger to nose testing:  Without dysmetria.   Heel to shin:  Without dysmetria.   Gait:  Normal station and stride.  Romberg negative.    Thank you for allowing me to take part in the care of this patient.  Metta Clines, DO  CC:  [name], DO

## 2021-04-14 ENCOUNTER — Other Ambulatory Visit: Payer: Self-pay

## 2021-04-14 ENCOUNTER — Ambulatory Visit: Payer: Medicare HMO | Admitting: Neurology

## 2021-04-14 ENCOUNTER — Encounter: Payer: Self-pay | Admitting: Neurology

## 2021-04-14 VITALS — BP 145/82 | HR 60 | Ht 67.0 in | Wt 162.6 lb

## 2021-04-14 DIAGNOSIS — I679 Cerebrovascular disease, unspecified: Secondary | ICD-10-CM | POA: Diagnosis not present

## 2021-04-14 DIAGNOSIS — R413 Other amnesia: Secondary | ICD-10-CM | POA: Diagnosis not present

## 2021-04-14 DIAGNOSIS — M5412 Radiculopathy, cervical region: Secondary | ICD-10-CM | POA: Diagnosis not present

## 2021-04-14 NOTE — Patient Instructions (Signed)
Start aspirin 81mg  daily Monitor memory - if you feel it is getting worse, we can re-evaluate.  At this time, I do not suspect any early dementia Continue exercise Recommend Mediterranean diet  Mediterranean Diet A Mediterranean diet refers to food and lifestyle choices that are based on the traditions of countries located on the [employer]. It focuses on eating more fruits, vegetables, whole grains, beans, nuts, seeds, and heart-healthy fats, and eating less dairy, meat, eggs, and processed foods with added sugar, salt, and fat. This way of eating has been shown to help prevent certain conditions and improve outcomes for people who have chronic diseases, like kidney disease and heart disease. What are tips for following this plan? Reading food labels Check the serving size of packaged foods. For foods such as rice and pasta, the serving size refers to the amount of cooked product, not dry. Check the total fat in packaged foods. Avoid foods that have saturated fat or trans fats. Check the ingredient list for added sugars, such as corn syrup. Shopping  Buy a variety of foods that offer a balanced diet, including: Fresh fruits and vegetables (produce). Grains, beans, nuts, and seeds. Some of these may be available in unpackaged forms or large amounts (in bulk). Fresh seafood. Poultry and eggs. Low-fat dairy products. Buy whole ingredients instead of prepackaged foods. Buy fresh fruits and vegetables in-season from local farmers markets. Buy plain frozen fruits and vegetables. If you do not have access to quality fresh seafood, buy precooked frozen shrimp or canned fish, such as tuna, salmon, or sardines. Stock your pantry so you always have certain foods on hand, such as olive oil, canned tuna, canned tomatoes, rice, pasta, and beans. Cooking Cook foods with extra-virgin olive oil instead of using butter or other vegetable oils. Have meat as a side dish, and have vegetables or grains  as your main dish. This means having meat in small portions or adding small amounts of meat to foods like pasta or stew. Use beans or vegetables instead of meat in common dishes like chili or lasagna. Experiment with different cooking methods. Try roasting, broiling, steaming, and sauting vegetables. Add frozen vegetables to soups, stews, pasta, or rice. Add nuts or seeds for added healthy fats and plant protein at each meal. You can add these to yogurt, salads, or vegetable dishes. Marinate fish or vegetables using olive oil, lemon juice, garlic, and fresh herbs. Meal planning Plan to eat one vegetarian meal one day each week. Try to work up to two vegetarian meals, if possible. Eat seafood two or more times a week. Have healthy snacks readily available, such as: Vegetable sticks with hummus. Greek yogurt. Fruit and nut trail mix. Eat balanced meals throughout the week. This includes: Fruit: 2-3 servings a day. Vegetables: 4-5 servings a day. Low-fat dairy: 2 servings a day. Fish, poultry, or lean meat: 1 serving a day. Beans and legumes: 2 or more servings a week. Nuts and seeds: 1-2 servings a day. Whole grains: 6-8 servings a day. Extra-virgin olive oil: 3-4 servings a day. Limit red meat and sweets to only a few servings a month. Lifestyle  Cook and eat meals together with your family, when possible. Drink enough fluid to keep your urine pale yellow. Be physically active every day. This includes: Aerobic exercise like running or swimming. Leisure activities like gardening, walking, or housework. Get 7-8 hours of sleep each night. If recommended by your health care provider, drink red wine in moderation. This means 1 glass  a day for nonpregnant women and 2 glasses a day for men. A glass of wine equals 5 oz (150 mL). What foods should I eat? Fruits Apples. Apricots. Avocado. Berries. Bananas. Cherries. Dates. Figs. Grapes. Lemons. Melon. Oranges. Peaches. Plums.  Pomegranate. Vegetables Artichokes. Beets. Broccoli. Cabbage. Carrots. Eggplant. Green beans. Chard. Kale. Spinach. Onions. Leeks. Peas. Squash. Tomatoes. Peppers. Radishes. Grains Whole-grain pasta. Brown rice. Bulgur wheat. Polenta. Couscous. Whole-wheat bread. [place]. Meats and other proteins Beans. Almonds. Sunflower seeds. Pine nuts. Peanuts. [place]. Salmon. Scallops. Shrimp. [place]. Tilapia. Clams. Oysters. Eggs. Poultry without skin. Dairy Low-fat milk. Cheese. Greek yogurt. Fats and oils Extra-virgin olive oil. Avocado oil. Grapeseed oil. Beverages Water. Red wine. Herbal tea. Sweets and desserts Greek yogurt with honey. Baked apples. Poached pears. Trail mix. Seasonings and condiments Basil. Cilantro. Coriander. Cumin. Mint. Parsley. Sage. Rosemary. Tarragon. Garlic. Oregano. Thyme. Pepper. Balsamic vinegar. Tahini. Hummus. Tomato sauce. Olives. Mushrooms. The items listed above may not be a complete list of foods and beverages you can eat. Contact a dietitian for more information. What foods should I limit? This is a list of foods that should be eaten rarely or only on special occasions. Fruits Fruit canned in syrup. Vegetables Deep-fried potatoes (french fries). Grains Prepackaged pasta or rice dishes. Prepackaged cereal with added sugar. Prepackaged snacks with added sugar. Meats and other proteins Beef. Pork. Lamb. Poultry with skin. Hot dogs. [place] Salines. Dairy Ice cream. Sour cream. Whole milk. Fats and oils Butter. Canola oil. Vegetable oil. Beef fat (tallow). Lard. Beverages Juice. Sugar-sweetened soft drinks. Beer. Liquor and spirits. Sweets and desserts Cookies. Cakes. Pies. Candy. Seasonings and condiments Mayonnaise. Pre-made sauces and marinades. The items listed above may not be a complete list of foods and beverages you should limit. Contact a dietitian for more information. Summary The Mediterranean diet includes both food and lifestyle choices. Eat a  variety of fresh fruits and vegetables, beans, nuts, seeds, and whole grains. Limit the amount of red meat and sweets that you eat. If recommended by your health care provider, drink red wine in moderation. This means 1 glass a day for nonpregnant women and 2 glasses a day for men. A glass of wine equals 5 oz (150 mL). This information is not intended to replace advice given to you by your health care provider. Make sure you discuss any questions you have with your health care provider. Document Revised: 06/02/2019 Document Reviewed: 03/30/2019 Elsevier Patient Education  2022 [employer].

## 2021-04-15 DIAGNOSIS — I10 Essential (primary) hypertension: Secondary | ICD-10-CM | POA: Diagnosis not present

## 2021-04-15 DIAGNOSIS — Z6825 Body mass index (BMI) 25.0-25.9, adult: Secondary | ICD-10-CM | POA: Diagnosis not present

## 2021-04-15 DIAGNOSIS — M47812 Spondylosis without myelopathy or radiculopathy, cervical region: Secondary | ICD-10-CM | POA: Diagnosis not present

## 2021-04-16 ENCOUNTER — Encounter: Payer: Self-pay | Admitting: Nurse Practitioner

## 2021-04-17 ENCOUNTER — Other Ambulatory Visit: Payer: Self-pay

## 2021-04-17 ENCOUNTER — Ambulatory Visit: Payer: Medicare HMO | Admitting: Dermatology

## 2021-04-17 DIAGNOSIS — L565 Disseminated superficial actinic porokeratosis (DSAP): Secondary | ICD-10-CM | POA: Diagnosis not present

## 2021-04-17 DIAGNOSIS — D485 Neoplasm of uncertain behavior of skin: Secondary | ICD-10-CM

## 2021-04-17 DIAGNOSIS — L570 Actinic keratosis: Secondary | ICD-10-CM

## 2021-04-17 DIAGNOSIS — D0472 Carcinoma in situ of skin of left lower limb, including hip: Secondary | ICD-10-CM

## 2021-04-22 ENCOUNTER — Telehealth: Payer: Self-pay | Admitting: Dermatology

## 2021-04-22 NOTE — Telephone Encounter (Signed)
Left message for patient to return our phone call for pathology results.

## 2021-04-22 NOTE — Telephone Encounter (Signed)
Pathology to patient- 6 month follow up scheduled.

## 2021-04-22 NOTE — Telephone Encounter (Signed)
Patient is calling for pathology results from last visit with Stuart Tafeen, MD 

## 2021-04-29 DIAGNOSIS — Z87891 Personal history of nicotine dependence: Secondary | ICD-10-CM | POA: Diagnosis not present

## 2021-04-29 DIAGNOSIS — J4530 Mild persistent asthma, uncomplicated: Secondary | ICD-10-CM | POA: Diagnosis not present

## 2021-04-29 DIAGNOSIS — R0683 Snoring: Secondary | ICD-10-CM | POA: Diagnosis not present

## 2021-04-29 DIAGNOSIS — R400 Somnolence: Secondary | ICD-10-CM | POA: Diagnosis not present

## 2021-04-30 ENCOUNTER — Ambulatory Visit: Payer: Medicare HMO | Admitting: Allergy

## 2021-05-07 ENCOUNTER — Encounter: Payer: Self-pay | Admitting: Dermatology

## 2021-05-07 NOTE — Progress Notes (Signed)
° °  Follow-Up Visit   Subjective  Donna [name] is a 70 y.o. female who presents for the following: Skin Problem (Here for itchy lesion on her left lower leg. Very itchy. It does bleed when patient shaves her legs. Tx hydrocortisone cream. ).  Crust on lower legs, several growing annually 1 on left lower leg Location:  Duration:  Quality:  Associated Signs/Symptoms: Modifying Factors:  Severity:  Timing: Context:   Objective  Well appearing patient in no apparent distress; mood and affect are within normal limits. Left Lower Leg - Anterior, Right Lower Leg - Anterior Multiple-2 mm crusts on the shins, several with sharp borders suggestive DSA P (versus avoid carrying larger/thicker lesions will be treated with LN2 freeze.  Left Lower Leg - Anterior 2 cm ill-defined waxy crust probable superficial carcinoma         A focused examination was performed including legs. Relevant physical exam findings are noted in the Assessment and Plan.   Assessment & Plan    DSAP (disseminated superficial actinic porokeratosis) (2) Left Lower Leg - Anterior; Right Lower Leg - Anterior  Destruction of lesion - Left Lower Leg - Anterior, Right Lower Leg - Anterior Complexity: simple   Destruction method: cryotherapy   Informed consent: discussed and consent obtained   Timeout:  patient name, date of birth, surgical site, and procedure verified Lesion destroyed using liquid nitrogen: Yes   Cryotherapy cycles:  3 Outcome: patient tolerated procedure well with no complications   Post-procedure details: wound care instructions given    Squamous cell carcinoma in situ (SCCIS) of skin of left lower leg Left Lower Leg - Anterior  Skin / nail biopsy Type of biopsy: tangential   Informed consent: discussed and consent obtained   Timeout: patient name, date of birth, surgical site, and procedure verified   Anesthesia: the lesion was anesthetized in a standard fashion   Anesthetic:  1%  lidocaine w/ epinephrine 1-100,000 local infiltration Instrument used: flexible razor blade   Hemostasis achieved with: ferric subsulfate   Outcome: patient tolerated procedure well   Post-procedure details: wound care instructions given    Destruction of lesion Complexity: simple   Destruction method: electrodesiccation and curettage   Informed consent: discussed and consent obtained   Timeout:  patient name, date of birth, surgical site, and procedure verified Anesthesia: the lesion was anesthetized in a standard fashion   Anesthetic:  1% lidocaine w/ epinephrine 1-100,000 local infiltration Curettage performed in three different directions: Yes   Curettage cycles:  3 Lesion length (cm):  2.2 Lesion width (cm):  2.2 Margin per side (cm):  0 Final wound size (cm):  2.2 Hemostasis achieved with:  aluminum chloride Outcome: patient tolerated procedure well with no complications   Post-procedure details: wound care instructions given    Specimen 1 - Surgical pathology Differential Diagnosis: scc vs bcc txpbx Check Margins: No  After shave biopsy the base was treated with curettage plus cautery      I, Lavonna Monarch, MD, have reviewed all documentation for this visit.  The documentation on 05/07/21 for the exam, diagnosis, procedures, and orders are all accurate and complete.

## 2021-05-09 ENCOUNTER — Ambulatory Visit: Payer: Medicare HMO | Admitting: Neurology

## 2021-05-12 ENCOUNTER — Ambulatory Visit: Payer: Medicare HMO | Admitting: Allergy

## 2021-05-14 ENCOUNTER — Encounter: Payer: Self-pay | Admitting: Nurse Practitioner

## 2021-05-14 ENCOUNTER — Ambulatory Visit: Payer: Medicare HMO | Admitting: Nurse Practitioner

## 2021-05-14 VITALS — BP 106/60 | HR 66 | Ht 67.0 in | Wt 167.0 lb

## 2021-05-14 DIAGNOSIS — Z800 Family history of malignant neoplasm of digestive organs: Secondary | ICD-10-CM

## 2021-05-14 DIAGNOSIS — K625 Hemorrhage of anus and rectum: Secondary | ICD-10-CM

## 2021-05-14 DIAGNOSIS — Z86010 Personal history of colonic polyps: Secondary | ICD-10-CM | POA: Diagnosis not present

## 2021-05-14 MED ORDER — NA SULFATE-K SULFATE-MG SULF 17.5-3.13-1.6 GM/177ML PO SOLN: 1.0000 | Freq: Once | ORAL | 0 refills | Status: AC

## 2021-05-14 NOTE — Progress Notes (Signed)
ASSESSMENT AND PLAN    # 71 yo female with a history of recurrent adenomatous colon polyps and a strong FMH of colon cancer. A 7 mm TA was removed at time of last colonoscopy in June 2019, a three year surveillance colonoscopy was recommended.   --Based on polyp surveillance guidelines changes, patient may not yet be due for surveillance colonoscopy.  However, given her strong family history as well as occasional rectal bleeding ( likely hemorrhoidal in setting of constipation) I am scheduling her for the procedure pending review by  Dr. Fuller Plan.  --The risks and benefits of colonoscopy with possible polypectomy / biopsies were discussed and the patient agrees to proceed.    HISTORY OF PRESENT ILLNESS    Chief Complaint : here to get colonoscopy scheduled.   Donna [name] is a 71 y.o. female known to Dr. Fuller Plan.  She has a history of colon polyps, strong family history of colon cancer,  GERD.  We have previously evaluated her for right-sided abdominal pain, bloating, chronic cough and dysphagia.  She had a 7 mm tubular adenomatous colon polyp removed June 2019, a 3-year surveillance colonoscopy was recommended .    Patient is here to get her recommended 3 year colonoscopy scheduled. She sometimes sees blood in her stool if constipated. Meat can cause constipation with hard stool which leads to straining and bleeding. She doesn't take anything for constipation when this happens. Otherwise her bowel movements are normal , once in am after a cup of coffee.   Patient has a very strong Aline of colon cancer. Father had colon cancer in  his 80's. Her paternal GM passed with colon cancer in her 18's. Her paternal aunt had colon / rectal cancer  and 3 paternal uncles had colon cancer as well.    Data Review:  September 2022 Hemoglobin 11.7, MCV 88, platelets 230   PREVIOUS ENDOSCOPIC EVALUATIONS / PERTINENT STUDIES:   June 2019 colonoscopy --One 7 mm polyp in the ascending colon, removed with a  cold snare. Resected and retrieved. - Mild diverticulosis in the sigmoid colon, in the descending colon and in the transverse colon. There was no evidence of diverticular bleeding. - The examination was otherwise normal on direct and retroflexion views. - Path = Tubular adenoma  October 2021 esophagram for dysphagia and cough --Small intermittent type I hiatal hernia, mild distal esophageal fold thickening possibly associated with esophagitis  November 2021 EGD for dysphagia and cough --Small hiatal hernia --No findings to explain patient's symptoms --Esophagus empirically dilated  Current Medications, Allergies, Past Medical History, Past Surgical History, Family History and Social History were reviewed in [employer] record.     Current Outpatient Medications  Medication Sig Dispense Refill   b complex vitamins tablet Take 1 tablet by mouth daily.       calcium gluconate 500 MG tablet Take 500 mg by mouth daily.       ciclesonide (ALVESCO) 80 MCG/ACT inhaler Inhale 1 puff into the lungs 2 (two) times daily. with spacer and rinse mouth afterwards. 1 each 3   estradiol (ESTRACE) 0.1 MG/GM vaginal cream SMARTSIG:1 Applicator Vaginal Once a Week     levalbuterol (XOPENEX HFA) 45 MCG/ACT inhaler Inhale 2 puffs into the lungs every 4 (four) hours as needed for wheezing or shortness of breath (coughings fits). 1 each 2   lisinopril (ZESTRIL) 5 MG tablet TAKE 1 TABLET BY MOUTH DAILY FOR ELEVATED BP WHILE TRAVELING 90 tablet 1   Loratadine (  CLARITIN PO) Take by mouth.     Magnesium 500 MG TABS Take 1 tablet by mouth daily.       Multiple Vitamin (MULTIVITAMIN) capsule Take 1 capsule by mouth daily.       prochlorperazine (COMPAZINE) 10 MG tablet Take 1 tablet (10 mg total) by mouth 2 (two) times daily as needed for nausea or vomiting. 10 tablet 0   propranolol (INDERAL) 10 MG tablet Take 10 mg by mouth 3 (three) times daily.     Spacer/Aero-Holding Chambers (AEROCHAMBER  PLUS) inhaler Use as instructed 1 each 2   valACYclovir (VALTREX) 500 MG tablet ONE BY MOUTH TWICE A DAY X 5 DAYS FOR HERPES OUTBREAK 10 tablet prn   No current facility-administered medications for this visit.    Review of Systems: No chest pain. No shortness of breath. No urinary complaints.   PHYSICAL EXAM :    Wt Readings from Last 3 Encounters:  04/14/21 162 lb 9.6 oz (73.8 kg)  04/08/21 160 lb (72.6 kg)  02/19/21 161 lb 12.8 oz (73.4 kg)    BP 106/60    Pulse 66    Ht 5\' 7"  (1.702 m)    Wt 167 lb (75.8 kg)    BMI 26.16 kg/m  Constitutional:  Generally well appearing female in no acute distress. Psychiatric: Pleasant. Normal mood and affect. Behavior is normal. EENT: Pupils normal.  Conjunctivae are normal. No scleral icterus. Neck supple.  Cardiovascular: Normal rate, regular rhythm. No edema Pulmonary/chest: Effort normal and breath sounds normal. No wheezing, rales or rhonchi. Abdominal: Soft, nondistended, nontender. Bowel sounds active throughout. There are no masses palpable. No hepatomegaly. Neurological: Alert and oriented to person place and time. Skin: Skin is warm and dry. No rashes noted.  Donna Savoy, NP  05/14/2021, 8:21 AM

## 2021-05-14 NOTE — Patient Instructions (Signed)
If you are age 70 or older, your body mass index should be between 23-30. Your Body mass index is 26.16 kg/m. If this is out of the aforementioned range listed, please consider follow up with your Primary Care Provider. ________________________________________________________  The [facility] providers would like to encourage you to use [facility] to communicate with providers for non-urgent requests or questions.  Due to long hold times on the telephone, sending your provider a message by [facility] may be a faster and more efficient way to get a response.  Please allow 48 business hours for a response.  Please remember that this is for non-urgent requests.  _______________________________________________________  Donna [name] have been scheduled for a colonoscopy. Please follow written instructions given to you at your visit today.  Please pick up your prep supplies at the pharmacy within the next 1-3 days. If you use inhalers (even only as needed), please bring them with you on the day of your procedure.  Follow up as needed.  Thank you for entrusting me with your care and choosing [facility].  Tye Savoy, NP-C

## 2021-05-21 ENCOUNTER — Telehealth: Payer: Self-pay | Admitting: Nurse Practitioner

## 2021-05-21 NOTE — Telephone Encounter (Signed)
Patient has a procedure in February.  She is waiting to see if her insurance will authorize the prep; otherwise, it's going to cost her $100.  If she picks it up from the pharmacy now and it is not approved, she cannot return it.  She is wondering if she should be given an alternative prep that would be more affordable for her.  Please call patient and advise.  Thank you.

## 2021-05-26 NOTE — Telephone Encounter (Signed)
Called patient's pharmacy to ask about cost for generic Suprep. I was advised that it would be $94.61 and would have to be ordered.

## 2021-05-26 NOTE — Telephone Encounter (Signed)
Called patient and discuss the prep. She was not concerned about the cost. Patient was wanting to know if it would be okay to wait to get it until her procedure has been approved. I have advised her that it should be fine. She said she has already told the pharmacy that she wanted to have them hold it until she found out about her procedure.

## 2021-05-27 ENCOUNTER — Ambulatory Visit: Payer: Medicare HMO | Admitting: Sports Medicine

## 2021-05-28 ENCOUNTER — Ambulatory Visit: Payer: Medicare HMO | Admitting: Physician Assistant

## 2021-05-29 ENCOUNTER — Ambulatory Visit: Payer: Medicare HMO | Admitting: Sports Medicine

## 2021-05-29 ENCOUNTER — Ambulatory Visit
Admission: RE | Admit: 2021-05-29 | Discharge: 2021-05-29 | Disposition: A | Discharge status: Home / Self Care | Payer: Medicare HMO | Source: Ambulatory Visit | Attending: Sports Medicine | Admitting: Sports Medicine

## 2021-05-29 VITALS — BP 130/74 | Ht 67.0 in | Wt 158.0 lb

## 2021-05-29 DIAGNOSIS — M79672 Pain in left foot: Secondary | ICD-10-CM

## 2021-05-30 NOTE — Progress Notes (Signed)
° °  Subjective:    Patient ID: Donna [name], female    DOB: 13-Jul-1950, 71 y.o.   MRN: [id number]  HPI  Ziyonna presents today with worsening left foot pain.  She was last seen in the office in November.  We made her some new inserts with green sports insoles and scaphoid pads.  We also provided her with an arch strap.  Despite all this, her pain has worsened.  She localizes all of her pain to the medial aspect of the midfoot near the navicular and the medial cuneiform.  Pain is most noticeable when she is barefoot.  She describes it as a burning type pain.  Pain will occasionally radiate into the forefoot as well.  She denies any pain in the lateral foot.  Her symptoms have been present now for several months.  Prior to seeing me, she saw Dr. Raeford Razor who provided her with some home exercises which have not been helpful.   Review of Systems As above    Objective:   Physical Exam  Well-developed, well-nourished.  No acute distress  Left foot: Full ankle range of motion.  No effusion.  There is tenderness to palpation in the area of the navicular and the medial cuneiform.  No soft tissue swelling.  No other bony or soft tissue tenderness to palpation throughout the rest of the foot.  Good pulses.  X-rays of the left foot including AP, lateral, and oblique views are unremarkable.  Specifically I see no evidence of stress fracture.      Assessment & Plan:   Chronic medial left foot pain worrisome for stress reaction versus stress fracture  Given her persistent symptoms despite conservative treatment over the past several months and her bony tenderness to palpation on exam, there is concern about a possible midfoot stress reaction or stress fracture.  Therefore, I recommended that we proceed with an MRI scan to evaluate further.  Phone follow-up with those results when available we will delineate further treatment based on those findings.  This note was dictated using Dragon naturally  speaking software and may contain errors in syntax, spelling, or content which have not been identified prior to signing this note.

## 2021-06-03 DIAGNOSIS — J9811 Atelectasis: Secondary | ICD-10-CM | POA: Diagnosis not present

## 2021-06-03 DIAGNOSIS — M47812 Spondylosis without myelopathy or radiculopathy, cervical region: Secondary | ICD-10-CM | POA: Diagnosis not present

## 2021-06-03 DIAGNOSIS — R053 Chronic cough: Secondary | ICD-10-CM | POA: Diagnosis not present

## 2021-06-06 ENCOUNTER — Ambulatory Visit
Admission: RE | Admit: 2021-06-06 | Discharge: 2021-06-06 | Disposition: A | Discharge status: Home / Self Care | Payer: Medicare HMO | Source: Ambulatory Visit | Attending: Sports Medicine | Admitting: Sports Medicine

## 2021-06-06 ENCOUNTER — Other Ambulatory Visit: Payer: Self-pay

## 2021-06-06 DIAGNOSIS — R0683 Snoring: Secondary | ICD-10-CM | POA: Diagnosis not present

## 2021-06-06 DIAGNOSIS — R600 Localized edema: Secondary | ICD-10-CM | POA: Diagnosis not present

## 2021-06-06 DIAGNOSIS — M79672 Pain in left foot: Secondary | ICD-10-CM

## 2021-06-06 DIAGNOSIS — R69 Illness, unspecified: Secondary | ICD-10-CM | POA: Diagnosis not present

## 2021-06-06 DIAGNOSIS — F418 Other specified anxiety disorders: Secondary | ICD-10-CM | POA: Diagnosis not present

## 2021-06-06 DIAGNOSIS — R0681 Apnea, not elsewhere classified: Secondary | ICD-10-CM | POA: Diagnosis not present

## 2021-06-09 ENCOUNTER — Ambulatory Visit: Payer: Medicare HMO | Admitting: Allergy

## 2021-06-11 ENCOUNTER — Other Ambulatory Visit: Payer: Self-pay

## 2021-06-11 ENCOUNTER — Encounter: Payer: Self-pay | Admitting: Dermatology

## 2021-06-11 ENCOUNTER — Telehealth: Payer: Self-pay | Admitting: Sports Medicine

## 2021-06-11 ENCOUNTER — Ambulatory Visit: Payer: Medicare HMO | Admitting: Dermatology

## 2021-06-11 DIAGNOSIS — L570 Actinic keratosis: Secondary | ICD-10-CM

## 2021-06-11 DIAGNOSIS — Z85828 Personal history of other malignant neoplasm of skin: Secondary | ICD-10-CM

## 2021-06-11 DIAGNOSIS — L820 Inflamed seborrheic keratosis: Secondary | ICD-10-CM | POA: Diagnosis not present

## 2021-06-11 MED ORDER — IMIQUIMOD 5 % EX CREA: TOPICAL_CREAM | CUTANEOUS | 0 refills | Status: DC

## 2021-06-11 NOTE — Telephone Encounter (Signed)
°  I spoke with Donna [name] on the phone yesterday after reviewing MRI findings of her left foot.  Dominant finding is some mild arthritic changes of both the medial and lateral sesamoid-metatarsal articulation with some subchondral reactive marrow edema in this area.  Also some mild edema in the abductor houses muscle.  Remainder of her foot MRI is unremarkable.  No signs of stress fracture.  She will has minimal pain when ambulating in a well cushioned shoe.  I recommended that she try to wear wear padded shoes as much as possible and avoid going barefoot.  She has also been passively dorsiflexing her great toe in an effort to stretch it out.  I recommended that she discontinue that since it is likely continuing to inflame the sesamoid bones.  She has also used a sesamoid pad in the past with good results.  If she needs help with this then she can return to our office and we would be happy to help her.  Follow-up if symptoms persist or worsen.

## 2021-06-12 DIAGNOSIS — M47816 Spondylosis without myelopathy or radiculopathy, lumbar region: Secondary | ICD-10-CM | POA: Diagnosis not present

## 2021-06-16 ENCOUNTER — Ambulatory Visit: Payer: Medicare HMO | Admitting: Allergy

## 2021-06-16 DIAGNOSIS — R911 Solitary pulmonary nodule: Secondary | ICD-10-CM | POA: Diagnosis not present

## 2021-06-16 DIAGNOSIS — J929 Pleural plaque without asbestos: Secondary | ICD-10-CM | POA: Diagnosis not present

## 2021-06-16 DIAGNOSIS — R053 Chronic cough: Secondary | ICD-10-CM | POA: Diagnosis not present

## 2021-06-16 DIAGNOSIS — I77810 Thoracic aortic ectasia: Secondary | ICD-10-CM | POA: Diagnosis not present

## 2021-06-16 DIAGNOSIS — J9811 Atelectasis: Secondary | ICD-10-CM | POA: Diagnosis not present

## 2021-06-16 DIAGNOSIS — I2510 Atherosclerotic heart disease of native coronary artery without angina pectoris: Secondary | ICD-10-CM | POA: Diagnosis not present

## 2021-06-18 DIAGNOSIS — M545 Low back pain, unspecified: Secondary | ICD-10-CM | POA: Diagnosis not present

## 2021-06-18 DIAGNOSIS — M47816 Spondylosis without myelopathy or radiculopathy, lumbar region: Secondary | ICD-10-CM | POA: Diagnosis not present

## 2021-06-20 ENCOUNTER — Telehealth: Payer: Self-pay | Admitting: Cardiovascular Disease

## 2021-06-20 DIAGNOSIS — I340 Nonrheumatic mitral (valve) insufficiency: Secondary | ICD-10-CM

## 2021-06-20 NOTE — Telephone Encounter (Signed)
Left message for patient to call back. Patient will need office visit and echo appointment in May.

## 2021-06-20 NOTE — Patient Instructions (Addendum)
Asthma:  Stop Alvesco 80 mcg due to side effects of headaches, nose bleeds and heart racing Daily controller medication(s): Start Qvar 40 mcg 1 puff twice a day with spacer and rinse mouth afterwards. I will start you at a low dose of inhaled corticosteroids due to your hesitancy to be on a daily inhaler and the side effects you had with Alvesco. Qvar is a pro drug also. If Qvar is not covered by your insurance we will try Flovent 44 mcg 1 puff twice a day with spacer to help prevent cough and wheeze. Let us know if you have problems with Qvar. May use levoalbuterol rescue inhaler 2 puffs every 4 to 6 hours as needed for shortness of breath, chest tightness, coughing, and wheezing. May use levoalbuterol rescue inhaler 2 puffs 5 to 15 minutes prior to strenuous physical activities. Monitor frequency of use.  Asthma control goals:  Full participation in all desired activities (may need albuterol before activity) Albuterol use two times or less a week on average (not counting use with activity) Cough interfering with sleep two times or less a month Oral steroids no more than once a year No hospitalizations   Chronic Rhinitis:(negative skin testing in 2021 at previous allergist) Use over the counter antihistamines such as Zyrtec (cetirizine), Claritin (loratadine), Allegra (fexofenadine), or Xyzal (levocetirizine) daily as needed. May switch antihistamines every few months. May use nasal saline spray (i.e., Simply Saline) as needed.   Gluten intolerance: Continue to avoid gluten. For mild symptoms you can take over the counter antihistamines such as Benadryl and monitor symptoms closely. If symptoms worsen or if you have severe symptoms including breathing issues, throat closure, significant swelling, whole body hives, severe diarrhea and vomiting, lightheadedness then seek immediate medical care.  Drug allergies: Continue to avoid drugs on the allergy list.   Follow up in 6- 8 weeks  or sooner if  needed.

## 2021-06-20 NOTE — Telephone Encounter (Signed)
Patient was calling in to schedule her yearly echocardiogram. There is no order for it. Please advise

## 2021-06-20 NOTE — Telephone Encounter (Signed)
Patient called back. Made patient an appointment and will have echo same day and before scheduled office visit on 10/08/21. Will send message to echo scheduler to help with this.

## 2021-06-23 ENCOUNTER — Other Ambulatory Visit: Payer: Self-pay

## 2021-06-23 ENCOUNTER — Other Ambulatory Visit: Payer: Self-pay | Admitting: *Deleted

## 2021-06-23 ENCOUNTER — Encounter: Payer: Self-pay | Admitting: Family

## 2021-06-23 ENCOUNTER — Ambulatory Visit: Payer: Medicare HMO | Admitting: Family

## 2021-06-23 ENCOUNTER — Other Ambulatory Visit: Payer: Self-pay | Admitting: Family

## 2021-06-23 VITALS — BP 110/68 | HR 71 | Temp 97.2°F | Resp 16 | Ht 67.0 in | Wt 163.2 lb

## 2021-06-23 DIAGNOSIS — J4540 Moderate persistent asthma, uncomplicated: Secondary | ICD-10-CM | POA: Diagnosis not present

## 2021-06-23 DIAGNOSIS — K9041 Non-celiac gluten sensitivity: Secondary | ICD-10-CM | POA: Diagnosis not present

## 2021-06-23 DIAGNOSIS — Z889 Allergy status to unspecified drugs, medicaments and biological substances status: Secondary | ICD-10-CM | POA: Diagnosis not present

## 2021-06-23 DIAGNOSIS — J3089 Other allergic rhinitis: Secondary | ICD-10-CM

## 2021-06-23 DIAGNOSIS — J310 Chronic rhinitis: Secondary | ICD-10-CM

## 2021-06-23 DIAGNOSIS — J45909 Unspecified asthma, uncomplicated: Secondary | ICD-10-CM

## 2021-06-23 MED ORDER — QVAR REDIHALER 40 MCG/ACT IN AERB: 1.0000 | INHALATION_SPRAY | Freq: Two times a day (BID) | RESPIRATORY_TRACT | 2 refills | Status: DC

## 2021-06-23 MED ORDER — FLOVENT HFA 44 MCG/ACT IN AERO: 1.0000 | INHALATION_SPRAY | Freq: Two times a day (BID) | RESPIRATORY_TRACT | 5 refills | Status: DC

## 2021-06-23 NOTE — Progress Notes (Signed)
[place] [address] Dept: [phone]  FOLLOW UP NOTE  Patient ID: Donna [name], female    DOB: [birth year]  Age: 71 y.o. MRN: [id number] Date of Office Visit: 06/23/2021  Assessment  Chief Complaint: Asthma (2 mth f/u - going okay)  HPI Donna [name] is a 71 year old female who presents today for follow-up of asthma, chronic rhinitis, gluten intolerance, and multiple drug allergies.  She was last seen on February 19, 2021 by Dr. Maudie Mercury.  Since her last office visit she reports that she has been having left foot sesamoiditis that is pulling on a tendon.  She also has a follow-up appointment with pulmonary tomorrow to go over a CT chest that she had completed last week.  She reports daily dry cough, wheezing at times where she hears a "sss noise", tightness in her chest sometimes after walking in the cold, and shortness of breath when walking up hills.  She denies nocturnal awakenings due to breathing problems, fever, and chills.  Since her last office visit she has not required any systemic steroids or made any trips to the emergency room or urgent care due to breathing problems.  She reports that steroids affect her negatively.  She is no longer using Alvesco 80 mcg 1 puff twice a day because she had a reaction to this medication when she tried it 2 different times.  She reports that she had headaches, started having a nosebleed, and it made her heart race.  She used her albuterol twice this past week and sometimes she will use it only once or none.  She asked if there is other options she could try for her asthma.  Discussed how there was Singulair, but she not interested in trying this medication due to already having to go see a therapist for her mood.  Chronic rhinitis is reported as controlled with an over-the-counter antihistamine as needed and saline spray as needed.  She is hesitant to use medicated nasal sprays.  She denies rhinorrhea, nasal congestion, and  postnasal drip.  She has not had any sinus infections since we last saw her.  She continues to avoid gluten without any problems.   Drug Allergies:  Allergies  Allergen Reactions   Ciprofloxacin     Neuropathy    Doxycycline Nausea Only   Gluten Meal     GI upset    Penicillins Nausea And Vomiting    "breaking out"   Quinolones Other (See Comments)    REACTION: sick / neuropathy     Review of Systems: Review of Systems  Constitutional:  Negative for chills and fever.  Eyes:        Reports itchy watery eyes at times  Respiratory:  Positive for cough, shortness of breath and wheezing.        Reports shortness of breath with walking up hills, daily dry cough, wheezing at times where she hears a "sss"noise,, and tightness in her chest sometimes after she walks in cold weather.  She denies nocturnal awakenings due to breathing problems  Cardiovascular:  Negative for chest pain and palpitations.  Gastrointestinal:        Reports heartburn/reflux symptoms only if she eats tomato products which this does not occur frequently  Genitourinary:  Negative for frequency.  Skin:  Negative for itching and rash.  Neurological:  Negative for headaches.  Endo/Heme/Allergies:  Negative for environmental allergies.    Physical Exam: BP 110/68    Pulse 71    Temp Marland Kitchen)  97.2 F (36.2 C)    Resp 16    Ht 5\' 7"  (1.702 m)    Wt 163 lb 3.2 oz (74 kg)    SpO2 97%    BMI 25.56 kg/m    Physical Exam Constitutional:      Appearance: Normal appearance.  HENT:     Head: Normocephalic and atraumatic.     Comments: Pharynx normal, eyes normal, ears normal, nose normal    Right Ear: Tympanic membrane, ear canal and external ear normal.     Left Ear: Tympanic membrane, ear canal and external ear normal.     Nose: Nose normal.     Mouth/Throat:     Mouth: Mucous membranes are moist.     Pharynx: Oropharynx is clear.  Eyes:     Conjunctiva/sclera: Conjunctivae normal.  Cardiovascular:     Rate and  Rhythm: Normal rate and regular rhythm.  Pulmonary:     Effort: Pulmonary effort is normal.     Breath sounds: Normal breath sounds.     Comments: Lungs clear to auscultation Musculoskeletal:     Cervical back: Neck supple.  Skin:    General: Skin is warm.  Neurological:     Mental Status: She is alert and oriented to person, place, and time.  Psychiatric:        Mood and Affect: Mood normal.        Behavior: Behavior normal.        Thought Content: Thought content normal.        Judgment: Judgment normal.    Diagnostics: FVC 2.54 L, FEV1 1.71 L.  (66%).  Predicted FVC 3.40 L, predicted FEV1 2.58 L.  Spirometry indicates mild restriction and mild airway obstruction.  Assessment and Plan: 1. Asthma, unspecified asthma severity, unspecified whether complicated, unspecified whether persistent   2. Chronic rhinitis   3. Gluten intolerance   4. Multiple drug allergies     Meds ordered this encounter  Medications   beclomethasone (QVAR REDIHALER) 40 MCG/ACT inhaler    Sig: Inhale 1 puff into the lungs 2 (two) times daily.    Dispense:  1 each    Refill:  2    Patient Instructions  Asthma:  Stop Alvesco 80 mcg due to side effects of headaches, nose bleeds and heart racing Daily controller medication(s): Start Qvar 40 mcg 1 puff twice a day with spacer and rinse mouth afterwards. I will start you at a low dose of inhaled corticosteroids due to your hesitancy to be on a daily inhaler and the side effects you had with Alvesco. Qvar is a pro drug also. If Qvar is not covered by your insurance we will try Flovent 44 mcg 1 puff twice a day with spacer to help prevent cough and wheeze. Let us know if you have problems with Qvar. May use levoalbuterol rescue inhaler 2 puffs every 4 to 6 hours as needed for shortness of breath, chest tightness, coughing, and wheezing. May use levoalbuterol rescue inhaler 2 puffs 5 to 15 minutes prior to strenuous physical activities. Monitor frequency of use.   Asthma control goals:  Full participation in all desired activities (may need albuterol before activity) Albuterol use two times or less a week on average (not counting use with activity) Cough interfering with sleep two times or less a month Oral steroids no more than once a year No hospitalizations   Chronic Rhinitis:(negative skin testing in 2021 at previous allergist) Use over the counter antihistamines such as Zyrtec (cetirizine), Claritin (  loratadine), Allegra (fexofenadine), or Xyzal (levocetirizine) daily as needed. May switch antihistamines every few months. May use nasal saline spray (i.e., Simply Saline) as needed.   Gluten intolerance: Continue to avoid gluten. For mild symptoms you can take over the counter antihistamines such as Benadryl and monitor symptoms closely. If symptoms worsen or if you have severe symptoms including breathing issues, throat closure, significant swelling, whole body hives, severe diarrhea and vomiting, lightheadedness then seek immediate medical care.  Drug allergies: Continue to avoid drugs on the allergy list.   Follow up in 6- 8 weeks  or sooner if needed.    Return in about 6 weeks (around 08/04/2021), or if symptoms worsen or fail to improve.    Thank you for the opportunity to care for this patient.  Please do not hesitate to contact me with questions.  Althea Charon, FNP [facility]

## 2021-06-23 NOTE — Telephone Encounter (Signed)
Please let Sinclair know that Qvar is not covered. Please send in a prescription for Flovent 44 mcg 1 puff twice a day with spacer as we discussed at your office visit.

## 2021-06-23 NOTE — Telephone Encounter (Signed)
New prescription has been sent in. Called and left a voicemail asking for patient to return call to inform of change in inhaler.

## 2021-06-23 NOTE — Telephone Encounter (Signed)
Patient called back and was advised of change in inhaler by Diandra. Patient verbalized understanding.

## 2021-06-24 DIAGNOSIS — J4530 Mild persistent asthma, uncomplicated: Secondary | ICD-10-CM | POA: Diagnosis not present

## 2021-06-26 DIAGNOSIS — I10 Essential (primary) hypertension: Secondary | ICD-10-CM | POA: Diagnosis not present

## 2021-06-26 DIAGNOSIS — M47816 Spondylosis without myelopathy or radiculopathy, lumbar region: Secondary | ICD-10-CM | POA: Diagnosis not present

## 2021-06-26 DIAGNOSIS — Z6825 Body mass index (BMI) 25.0-25.9, adult: Secondary | ICD-10-CM | POA: Diagnosis not present

## 2021-07-01 ENCOUNTER — Encounter: Payer: Medicare HMO | Admitting: Gastroenterology

## 2021-07-03 ENCOUNTER — Encounter: Payer: Self-pay | Admitting: Gastroenterology

## 2021-07-04 ENCOUNTER — Encounter: Payer: Self-pay | Admitting: Dermatology

## 2021-07-04 NOTE — Progress Notes (Signed)
° °  Follow-Up Visit   Subjective  Donna [name] is a 71 y.o. female who presents for the following: Skin Problem (Lesion on back left shoulder x month, itching. Lesion on left leg x months. Personal history of bcc and scc. ).  patient with history of skin cancers Location:  Duration:  Quality:  Associated Signs/Symptoms: Modifying Factors:  Severity:  Timing: Context:   Objective  Well appearing patient in no apparent distress; mood and affect are within normal limits. Left Lower Leg - Anterior Gritty 6 mm pink crust, CIS versus inflamed DSAP versus AK  Left Shoulder - Posterior 7 mm flattopped pink-tan textured slightly inflamed papule, dermoscopy compatible    A focused examination was performed including head, neck, upper torso, arms, legs. Relevant physical exam findings are noted in the Assessment and Plan.   Assessment & Plan    Actinic keratosis Left Lower Leg - Anterior  We will try Aldara Monday Wednesday and Friday for 6 weeks.  Consider biopsy if this fails.  Related Medications imiquimod (ALDARA) 5 % cream APPLY TO AFFECTED AREA ON THE LEG M-W-F FOR 8 WKS  Inflamed seborrheic keratosis Left Shoulder - Posterior  Destruction of lesion - Left Shoulder - Posterior Complexity: simple   Destruction method: cryotherapy   Informed consent: discussed and consent obtained   Timeout:  patient name, date of birth, surgical site, and procedure verified Lesion destroyed using liquid nitrogen: Yes   Cryotherapy cycles:  3 Outcome: patient tolerated procedure well with no complications   Post-procedure details: wound care instructions given        I, Lavonna Monarch, MD, have reviewed all documentation for this visit.  The documentation on 07/04/21 for the exam, diagnosis, procedures, and orders are all accurate and complete.

## 2021-07-07 ENCOUNTER — Ambulatory Visit: Payer: Medicare HMO | Admitting: Psychology

## 2021-07-09 ENCOUNTER — Encounter: Payer: Self-pay | Admitting: Gastroenterology

## 2021-07-09 ENCOUNTER — Ambulatory Visit (AMBULATORY_SURGERY_CENTER): Payer: Medicare HMO | Admitting: Gastroenterology

## 2021-07-09 ENCOUNTER — Other Ambulatory Visit: Payer: Self-pay

## 2021-07-09 VITALS — BP 109/53 | HR 48 | Temp 97.2°F | Resp 12 | Ht 67.0 in | Wt 167.0 lb

## 2021-07-09 DIAGNOSIS — J45909 Unspecified asthma, uncomplicated: Secondary | ICD-10-CM | POA: Diagnosis not present

## 2021-07-09 DIAGNOSIS — Z86010 Personal history of colonic polyps: Secondary | ICD-10-CM | POA: Diagnosis not present

## 2021-07-09 DIAGNOSIS — Z860101 Personal history of adenomatous and serrated colon polyps: Secondary | ICD-10-CM

## 2021-07-09 DIAGNOSIS — Z800 Family history of malignant neoplasm of digestive organs: Secondary | ICD-10-CM | POA: Diagnosis not present

## 2021-07-09 DIAGNOSIS — I10 Essential (primary) hypertension: Secondary | ICD-10-CM | POA: Diagnosis not present

## 2021-07-09 MED ORDER — SODIUM CHLORIDE 0.9 % IV SOLN
500.0000 mL | INTRAVENOUS | Status: DC
Start: 2021-07-09 — End: 2023-05-12

## 2021-07-09 NOTE — Patient Instructions (Signed)
Please read handouts provided. ?Continue present medications. ?Repeat colonoscopy in 5 years for screening. ? ? ?YOU HAD AN ENDOSCOPIC PROCEDURE TODAY AT [facility]:   Refer to the procedure report that was given to you for any specific questions about what was found during the examination.  If the procedure report does not answer your questions, please call your gastroenterologist to clarify.  If you requested that your care partner not be given the details of your procedure findings, then the procedure report has been included in a sealed envelope for you to review at your convenience later. ? ?YOU SHOULD EXPECT: Some feelings of bloating in the abdomen. Passage of more gas than usual.  Walking can help get rid of the air that was put into your GI tract during the procedure and reduce the bloating. If you had a lower endoscopy (such as a colonoscopy or flexible sigmoidoscopy) you may notice spotting of blood in your stool or on the toilet paper. If you underwent a bowel prep for your procedure, you may not have a normal bowel movement for a few days. ? ?Please Note:  You might notice some irritation and congestion in your nose or some drainage.  This is from the oxygen used during your procedure.  There is no need for concern and it should clear up in a day or so. ? ?SYMPTOMS TO REPORT IMMEDIATELY: ? ?Following lower endoscopy (colonoscopy or flexible sigmoidoscopy): ? Excessive amounts of blood in the stool ? Significant tenderness or worsening of abdominal pains ? Swelling of the abdomen that is new, acute ? Fever of 100?F or higher ? ? ?For urgent or emergent issues, a gastroenterologist can be reached at any hour by calling [phone]. ?Do not use MyChart messaging for urgent concerns.  ? ? ?DIET:  We do recommend a small meal at first, but then you may proceed to your regular diet.  Drink plenty of fluids but you should avoid alcoholic beverages for 24 hours. ? ?ACTIVITY:  You should  plan to take it easy for the rest of today and you should NOT DRIVE or use heavy machinery until tomorrow (because of the sedation medicines used during the test).   ? ?FOLLOW UP: ?Our staff will call the number listed on your records 48-72 hours following your procedure to check on you and address any questions or concerns that you may have regarding the information given to you following your procedure. If we do not reach you, we will leave a message.  We will attempt to reach you two times.  During this call, we will ask if you have developed any symptoms of COVID 19. If you develop any symptoms (ie: fever, flu-like symptoms, shortness of breath, cough etc.) before then, please call [phone].  If you test positive for Covid 19 in the 2 weeks post procedure, please call and report this information to [place].   ? ?If any biopsies were taken you will be contacted by phone or by letter within the next 1-3 weeks.  Please call us at [phone] if you have not heard about the biopsies in 3 weeks.  ? ? ?SIGNATURES/CONFIDENTIALITY: ?You and/or your care partner have signed paperwork which will be entered into your electronic medical record.  These signatures attest to the fact that that the information above on your After Visit Summary has been reviewed and is understood.  Full responsibility of the confidentiality of this discharge information lies with you and/or your care-partner.  ?

## 2021-07-09 NOTE — Progress Notes (Signed)
History & Physical  Primary Care Physician:  [name], DO Primary Gastroenterologist: Lucio Edward, MD  CHIEF COMPLAINT: Family history of colon cancer, Personal history of colon polyps   HPI: Donna [name] is a 70 y.o. female with a personal history of adenomatous colon polyps and family history of colon cancer in a first-degree relative for colonoscopy today.    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 Authorizing Provider  b complex vitamins tablet Take 1 tablet by mouth daily.     Yes [provider]  estradiol (ESTRACE) 0.1 MG/GM vaginal cream SMARTSIG:1 Applicator Vaginal Once a Week 01/14/21  Yes [provider]  levalbuterol (XOPENEX HFA) 45 MCG/ACT inhaler Inhale 2 puffs into the lungs every 4 (four) hours as needed for wheezing or shortness of breath (coughings fits). 02/19/21  Yes Garnet Sierras, DO  Loratadine (CLARITIN PO) Take by mouth.   Yes [provider]  Magnesium 500 MG TABS Take 1 tablet by mouth daily.     Yes [provider]  Multiple Vitamin (MULTIVITAMIN) capsule Take 1 capsule by mouth daily.     Yes [provider]  propranolol (INDERAL) 10 MG tablet Take 10 mg by mouth 3 (three) times daily. 12/02/20  Yes [provider]  valACYclovir (VALTREX) 500 MG tablet ONE BY MOUTH TWICE A DAY X 5 DAYS FOR HERPES OUTBREAK Patient taking differently: 2 (two) times daily as needed. ONE BY MOUTH TWICE A DAY X 5 DAYS FOR HERPES OUTBREAK 11/25/19  Yes Baxley, Cresenciano Lick, MD  beclomethasone (QVAR REDIHALER) 40 MCG/ACT inhaler Inhale 1 puff into the lungs 2 (two) times daily. Patient not taking: Reported on 07/09/2021 06/23/21   Althea Charon, FNP  busPIRone (BUSPAR) 5 MG tablet Take 5-10 mg by mouth 2 (two) times daily as needed. 06/06/21   [provider]  calcium gluconate 500 MG tablet Take 500 mg by mouth daily.   Patient not taking: Reported on 07/09/2021    [provider]  FLOVENT HFA 44 MCG/ACT inhaler Inhale 1 puff into the lungs in the morning and at bedtime. 06/23/21   Althea Charon, FNP  imiquimod [place]) 5 % cream APPLY TO AFFECTED AREA ON THE LEG M-W-F FOR 8 WKS Patient not taking: Reported on 07/09/2021 06/11/21   Lavonna Monarch, MD  lisinopril (ZESTRIL) 5 MG tablet TAKE 1 TABLET BY MOUTH DAILY FOR ELEVATED BP WHILE TRAVELING Patient not taking: Reported on 07/09/2021 11/05/19   Elby Showers, MD  Spacer/Aero-Holding Chambers (AEROCHAMBER PLUS) inhaler Use as instructed Patient not taking: Reported on 07/09/2021 02/19/21  Garnet Sierras, DO    Current Outpatient Medications  Medication Sig Dispense Refill   b complex vitamins tablet Take 1 tablet by mouth daily.       estradiol (ESTRACE) 0.1 MG/GM vaginal cream SMARTSIG:1 Applicator Vaginal Once a Week     levalbuterol (XOPENEX HFA) 45 MCG/ACT inhaler Inhale 2 puffs into the lungs every 4 (four) hours as needed for wheezing or shortness of breath (coughings fits). 1 each 2   Loratadine (CLARITIN PO) Take by mouth.     Magnesium 500 MG TABS Take 1 tablet by mouth daily.       Multiple Vitamin (MULTIVITAMIN) capsule Take 1 capsule by mouth daily.       propranolol (INDERAL) 10 MG tablet Take 10 mg by mouth 3 (three) times daily.     valACYclovir (VALTREX)  500 MG tablet ONE BY MOUTH TWICE A DAY X 5 DAYS FOR HERPES OUTBREAK (Patient taking differently: 2 (two) times daily as needed. ONE BY MOUTH TWICE A DAY X 5 DAYS FOR HERPES OUTBREAK) 10 tablet prn   beclomethasone (QVAR REDIHALER) 40 MCG/ACT inhaler Inhale 1 puff into the lungs 2 (two) times daily. (Patient not taking: Reported on 07/09/2021) 1 each 2   busPIRone (BUSPAR) 5 MG tablet Take 5-10 mg by mouth 2 (two) times daily as needed.     calcium gluconate 500 MG tablet Take 500 mg by mouth daily.   (Patient not taking: Reported on 07/09/2021)     FLOVENT HFA 44 MCG/ACT inhaler Inhale 1 puff into the lungs in the morning and at bedtime. 10.6 g 5   imiquimod (ALDARA) 5 % cream APPLY TO AFFECTED AREA ON THE LEG M-W-F FOR 8 WKS (Patient not taking: Reported on 07/09/2021) 6 each 0   lisinopril (ZESTRIL) 5 MG tablet TAKE 1 TABLET BY MOUTH DAILY FOR ELEVATED BP WHILE TRAVELING (Patient not taking: Reported on 07/09/2021) 90 tablet 1   Spacer/Aero-Holding Chambers (AEROCHAMBER PLUS) inhaler Use as instructed (Patient not taking: Reported on 07/09/2021) 1 each 2   Current Facility-Administered Medications  Medication Dose Route Frequency Provider Last Rate Last Admin   0.9 %  sodium chloride infusion  500 mL Intravenous Continuous Ladene Artist, MD        Allergies as of 07/09/2021 - Review Complete 07/09/2021  Allergen Reaction Noted   Ciprofloxacin     Doxycycline Nausea Only 03/01/2017   Gluten meal  12/07/2016   Penicillins Nausea And Vomiting 03/05/2010   Quinolones Other (See Comments) 12/10/2008    Family History  Problem Relation Age of Onset   Dementia Mother    Diabetes Mother    Melanoma Father    Colon cancer Father 84       Survived   Heart disease Father    Heart attack Father    Rheum arthritis Father    Dementia Father    Healthy Sister    Healthy Brother    Colon cancer Paternal Grandmother    Dementia Maternal Aunt    Dementia Maternal Uncle    Colon polyps Paternal Aunt     Pancreatic cancer Paternal Aunt    Rectal cancer Paternal Aunt    Colon cancer Paternal Aunt    Stomach cancer Neg Hx    Esophageal cancer Neg Hx    Liver cancer Neg Hx     Social History   Socioeconomic History   Marital status: Married    Spouse name: Not on file   Number of children: 0  Years of education: Not on file   Highest education level: Associate degree: academic program  Occupational History   Occupation: [employer]  Tobacco Use   Smoking status: Former    Years: 30.00    Types: Cigarettes    Quit date: 10/15/1996    Years since quitting: 24.7   Smokeless tobacco: Never  Vaping Use   Vaping Use: Never used  Substance and Sexual Activity   Alcohol use: No   Drug use: No   Sexual activity: Yes  Other Topics Concern   Not on file  Social History Narrative   2 caffeine drinks daily         Social Determinants of Health   Financial Resource Strain: Not on file  Food Insecurity: Not on file  Transportation Needs: Not on file  Physical Activity: Not on file  Stress: Not on file  Social Connections: Not on file  Intimate Partner Violence: Not on file    Review of Systems:  All systems reviewed an negative except where noted in HPI.  Gen: Denies any fever, chills, sweats, anorexia, fatigue, weakness, malaise, weight loss, and sleep disorder CV: Denies chest pain, angina, palpitations, syncope, orthopnea, PND, peripheral edema, and claudication. Resp: Denies dyspnea at rest, dyspnea with exercise, cough, sputum, wheezing, coughing up blood, and pleurisy. GI: Denies vomiting blood, jaundice, and fecal incontinence.   Denies dysphagia or odynophagia. GU : Denies urinary burning, blood in urine, urinary frequency, urinary hesitancy, nocturnal urination, and urinary incontinence. MS: Denies joint pain, limitation of movement, and swelling, stiffness, low back pain, extremity pain. Denies muscle weakness, cramps, atrophy.  Derm: Denies rash, itching, dry skin,  hives, moles, warts, or unhealing ulcers.  Psych: Denies depression, anxiety, memory loss, suicidal ideation, hallucinations, paranoia, and confusion. Heme: Denies bruising, bleeding, and enlarged lymph nodes. Neuro:  Denies any headaches, dizziness, paresthesias. Endo:  Denies any problems with DM, thyroid, adrenal function.   Physical Exam: General:  Alert, well-developed, in NAD Head:  Normocephalic and atraumatic. Eyes:  Sclera clear, no icterus.   Conjunctiva pink. Ears:  Normal auditory acuity. Mouth:  No deformity or lesions.  Neck:  Supple; no masses . Lungs:  Clear throughout to auscultation.   No wheezes, crackles, or rhonchi. No acute distress. Heart:  Regular rate and rhythm; no murmurs. Abdomen:  Soft, nondistended, nontender. No masses, hepatomegaly. No obvious masses.  Normal bowel .    Rectal:  Deferred   Msk:  Symmetrical without gross deformities.. Pulses:  Normal pulses noted. Extremities:  Without edema. Neurologic:  Alert and  oriented x4;  grossly normal neurologically. Skin:  Intact without significant lesions or rashes. Cervical Nodes:  No significant cervical adenopathy. Psych:  Alert and cooperative. Normal mood and affect.   Impression / Plan:   Personal history of adenomatous colon polyps and family history of colon cancer in a first-degree relative for colonoscopy today.  Pricilla Riffle. Fuller Plan  07/09/2021, 9:00 AM See Shea Evans, [facility], to contact our on call provider

## 2021-07-09 NOTE — Progress Notes (Signed)
Report to PACU, RN, vss, BBS= Clear.  

## 2021-07-09 NOTE — Progress Notes (Signed)
VS per CNW ?

## 2021-07-09 NOTE — Op Note (Signed)
[facility] ?Patient Name: Donna [name] ?Procedure Date: 07/09/2021 8:55 AM ?MRN: [id number] ?Endoscopist: Ladene Artist , MD ?Age: 71 ?Referring MD:  ?Date of Birth: 05-03-1951 ?Gender: Female ?Account #: [id number] ?Procedure:                Colonoscopy ?Indications:              Surveillance: Personal history of adenomatous  ?                          polyps on last colonoscopy > 3 years ago, Family  ?                          history of colon cancer, first degree relative < 60. ?Medicines:                Monitored Anesthesia Care ?Procedure:                Pre-Anesthesia Assessment: ?                          - Prior to the procedure, a History and Physical  ?                          was performed, and patient medications and  ?                          allergies were reviewed. The patient's tolerance of  ?                          previous anesthesia was also reviewed. The risks  ?                          and benefits of the procedure and the sedation  ?                          options and risks were discussed with the patient.  ?                          All questions were answered, and informed consent  ?                          was obtained. Prior Anticoagulants: The patient has  ?                          taken no previous anticoagulant or antiplatelet  ?                          agents. ASA Grade Assessment: II - A patient with  ?                          mild systemic disease. After reviewing the risks  ?                          and benefits, the patient was deemed in  ?  satisfactory condition to undergo the procedure. ?                          After obtaining informed consent, the colonoscope  ?                          was passed under direct vision. Throughout the  ?                          procedure, the patient's blood pressure, pulse, and  ?                          oxygen saturations were monitored continuously. The  ?                          Olympus  PCF-H190DL ([id number]) Colonoscope was  ?                          introduced through the anus and advanced to the the  ?                          cecum, identified by appendiceal orifice and  ?                          ileocecal valve. The ileocecal valve, appendiceal  ?                          orifice, and rectum were photographed. The quality  ?                          of the bowel preparation was good. The colonoscopy  ?                          was performed without difficulty. The patient  ?                          tolerated the procedure well. ?Scope In: 9:02:34 AM ?Scope Out: 9:15:11 AM ?Scope Withdrawal Time: 0 hours 9 minutes 37 seconds  ?Total Procedure Duration: 0 hours 12 minutes 37 seconds  ?Findings:                 The perianal and digital rectal examinations were  ?                          normal. ?                          Scattered small-mouthed diverticula were found in  ?                          the sigmoid colon, descending colon and transverse  ?                          colon. There was no evidence of diverticular  ?  bleeding. ?                          The exam was otherwise without abnormality on  ?                          direct and retroflexion views. ?Complications:            No immediate complications. Estimated blood loss:  ?                          None. ?Estimated Blood Loss:     Estimated blood loss: none. ?Impression:               - Mild diverticulosis in the sigmoid colon, in the  ?                          descending colon and in the transverse colon. ?                          - The examination was otherwise normal on direct  ?                          and retroflexion views. ?                          - No specimens collected. ?Recommendation:           - Repeat colonoscopy in 5 years for surveillance. ?                          - Patient has a contact number available for  ?                          emergencies. The signs and symptoms of  potential  ?                          delayed complications were discussed with the  ?                          patient. Return to normal activities tomorrow.  ?                          Written discharge instructions were provided to the  ?                          patient. ?                          - Resume previous diet. ?                          - Continue present medications. ?Ladene Artist, MD ?07/09/2021 9:19:19 AM ?This report has been signed electronically. ?

## 2021-07-14 DIAGNOSIS — F4310 Post-traumatic stress disorder, unspecified: Secondary | ICD-10-CM | POA: Diagnosis not present

## 2021-07-14 DIAGNOSIS — R69 Illness, unspecified: Secondary | ICD-10-CM | POA: Diagnosis not present

## 2021-07-18 DIAGNOSIS — M47812 Spondylosis without myelopathy or radiculopathy, cervical region: Secondary | ICD-10-CM | POA: Diagnosis not present

## 2021-07-18 DIAGNOSIS — M47816 Spondylosis without myelopathy or radiculopathy, lumbar region: Secondary | ICD-10-CM | POA: Diagnosis not present

## 2021-07-19 DIAGNOSIS — H524 Presbyopia: Secondary | ICD-10-CM | POA: Diagnosis not present

## 2021-07-19 DIAGNOSIS — H52209 Unspecified astigmatism, unspecified eye: Secondary | ICD-10-CM | POA: Diagnosis not present

## 2021-07-21 DIAGNOSIS — L570 Actinic keratosis: Secondary | ICD-10-CM | POA: Diagnosis not present

## 2021-07-21 HISTORY — PX: CERVICAL SPINE SURGERY: SHX589

## 2021-07-22 ENCOUNTER — Ambulatory Visit: Payer: Medicare HMO | Admitting: Dermatology

## 2021-07-29 DIAGNOSIS — Z13220 Encounter for screening for lipoid disorders: Secondary | ICD-10-CM | POA: Diagnosis not present

## 2021-07-29 DIAGNOSIS — R946 Abnormal results of thyroid function studies: Secondary | ICD-10-CM | POA: Diagnosis not present

## 2021-07-29 DIAGNOSIS — R7309 Other abnormal glucose: Secondary | ICD-10-CM | POA: Diagnosis not present

## 2021-07-29 DIAGNOSIS — E559 Vitamin D deficiency, unspecified: Secondary | ICD-10-CM | POA: Diagnosis not present

## 2021-07-29 DIAGNOSIS — Z79899 Other long term (current) drug therapy: Secondary | ICD-10-CM | POA: Diagnosis not present

## 2021-07-29 DIAGNOSIS — E538 Deficiency of other specified B group vitamins: Secondary | ICD-10-CM | POA: Diagnosis not present

## 2021-07-31 ENCOUNTER — Ambulatory Visit: Payer: Medicare HMO | Admitting: Sports Medicine

## 2021-08-04 DIAGNOSIS — Z13220 Encounter for screening for lipoid disorders: Secondary | ICD-10-CM | POA: Diagnosis not present

## 2021-08-04 DIAGNOSIS — R946 Abnormal results of thyroid function studies: Secondary | ICD-10-CM | POA: Diagnosis not present

## 2021-08-04 DIAGNOSIS — Z0000 Encounter for general adult medical examination without abnormal findings: Secondary | ICD-10-CM | POA: Diagnosis not present

## 2021-08-04 DIAGNOSIS — E559 Vitamin D deficiency, unspecified: Secondary | ICD-10-CM | POA: Diagnosis not present

## 2021-08-04 DIAGNOSIS — Z79899 Other long term (current) drug therapy: Secondary | ICD-10-CM | POA: Diagnosis not present

## 2021-08-04 DIAGNOSIS — R7309 Other abnormal glucose: Secondary | ICD-10-CM | POA: Diagnosis not present

## 2021-08-07 ENCOUNTER — Ambulatory Visit: Payer: Medicare HMO | Admitting: Sports Medicine

## 2021-08-07 VITALS — BP 132/78 | Ht 67.0 in | Wt 155.0 lb

## 2021-08-07 DIAGNOSIS — M79672 Pain in left foot: Secondary | ICD-10-CM | POA: Diagnosis not present

## 2021-08-07 NOTE — Progress Notes (Signed)
PCP: [name], DO ? ?Subjective:  ? ?HPI: ?Patient is a 71 y.o. female here for follow up of left foot pain. MRI (1/23) revealed mild arthritic changes of the medial and lateral hallux sesamoid-metatarsal articulation with subchondral reactive marrow edema, lateral worse than medial. Mild edema in the abductor hallucis muscle likely reflecting mild muscle strain.  ? ?Patient reports some improvement in her pain while wearing green sports insoles with scaphoid pads. She is able to walk pain free for up to 30 min then painful after that. Continues to teach yoga in shoes and occasional movements do bother her arch.  ? ?Patient locates most of her pain to the long arch, which is tender to palpation. States that the swelling has improved recently. The ball of her foot is not as tender. She did try dancer pads but notes that this actually aggravated her pain.  ? ?She is interested in PT to strengthen her arch.  ? ? ?Past Medical History:  ?Diagnosis Date  ? Anxiety   ? Asthma   ? Basal cell carcinoma 06/29/2017  ? right lip - CX3 + 5FU  ? Cataract   ? bilateral lens implants  ? Cough variant asthma   ? Depression   ? GERD (gastroesophageal reflux disease)   ? Heart murmur   ? Hiatal hernia   ? Hyperlipidemia   ? IBS (irritable bowel syndrome)   ? 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 chest - CX3 + 5FU  ? Squamous cell carcinoma of skin 09/27/2014  ? left thigh - tx p bx  ? Substance abuse ([place])   ? ? ?Current Outpatient Medications on File Prior to Visit  ?Medication Sig Dispense Refill  ? b complex vitamins tablet Take 1 tablet by mouth daily.      ? beclomethasone (QVAR REDIHALER) 40 MCG/ACT inhaler Inhale 1 puff into the lungs 2 (two) times daily. (Patient not taking: Reported on 07/09/2021) 1 each 2  ? busPIRone (BUSPAR) 5 MG tablet Take 5-10 mg by mouth 2 (two) times daily as needed.    ? calcium gluconate 500 MG tablet Take 500 mg by mouth daily.   (Patient not taking: Reported on  07/09/2021)    ? estradiol (ESTRACE) 0.1 MG/GM vaginal cream SMARTSIG:1 Applicator Vaginal Once a Week    ? FLOVENT HFA 44 MCG/ACT inhaler Inhale 1 puff into the lungs in the morning and at bedtime. 10.6 g 5  ? imiquimod (ALDARA) 5 % cream APPLY TO AFFECTED AREA ON THE LEG M-W-F FOR 8 WKS (Patient not taking: Reported on 07/09/2021) 6 each 0  ? levalbuterol (XOPENEX HFA) 45 MCG/ACT inhaler Inhale 2 puffs into the lungs every 4 (four) hours as needed for wheezing or shortness of breath (coughings fits). 1 each 2  ? lisinopril (ZESTRIL) 5 MG tablet TAKE 1 TABLET BY MOUTH DAILY FOR ELEVATED BP WHILE TRAVELING (Patient not taking: Reported on 07/09/2021) 90 tablet 1  ? Loratadine (CLARITIN PO) Take by mouth.    ? Magnesium 500 MG TABS Take 1 tablet by mouth daily.      ? Multiple Vitamin (MULTIVITAMIN) capsule Take 1 capsule by mouth daily.      ? propranolol (INDERAL) 10 MG tablet Take 10 mg by mouth 3 (three) times daily.    ? Spacer/Aero-Holding Chambers (AEROCHAMBER PLUS) inhaler Use as instructed (Patient not taking: Reported on 07/09/2021) 1 each 2  ? valACYclovir (VALTREX) 500 MG tablet ONE BY MOUTH TWICE A DAY X 5 DAYS  FOR HERPES OUTBREAK (Patient taking differently: 2 (two) times daily as needed. ONE BY MOUTH TWICE A DAY X 5 DAYS FOR HERPES OUTBREAK) 10 tablet prn  ? ?Current Facility-Administered Medications on File Prior to Visit  ?Medication Dose Route Frequency Provider Last Rate Last Admin  ? 0.9 %  sodium chloride infusion  500 mL Intravenous Continuous Ladene Artist, MD      ? ? ?Past Surgical History:  ?Procedure Laterality Date  ? ANKLE SURGERY    ? Bilateral   ? CATARACT EXTRACTION Bilateral   ? TUBAL LIGATION    ? ? ?Allergies  ?Allergen Reactions  ? Ciprofloxacin   ?  Neuropathy   ? Doxycycline Nausea Only  ? Gluten Meal   ?  GI upset   ? Penicillins Nausea And Vomiting  ?  "breaking out"  ? Quinolones Other (See Comments)  ?  REACTION: sick / neuropathy ?  ? ? ?BP 132/78   Ht '5\' 7"'$  (1.702 m)   Wt 155  lb (70.3 kg)   BMI 24.28 kg/m?  ? ? ?  04/08/2021  ?  2:05 PM  ?[place] Adult Exercise  ?Frequency of aerobic exercise (# of days/week) 2  ?Average time in minutes 25  ?Frequency of strengthening activities (# of days/week) 0  ? ? ?   ? View : No data to display.  ?  ?  ?  ? ? ?    ?Objective:  ?Physical Exam: ? ?Gen: NAD, comfortable in exam room ?CV: Regular rate, well perfused ?Resp: No increased work of breathing, coughing or wheezing ?Psych: Normal mood and affect.  ?MSK: No swelling of the left foot. High long arches bilaterally. TTP over the left long arch, but not over the sesamoids. Ankle ROM is slightly limited in all directions bilaterally. 5/5 strength in dorsi/plantarflexion bilaterally. Negative calcaneal squeeze.  ? ?Ultrasound: Hypoechoic region of the left abductor hallucis with positive Doppler.  ?  ?Assessment & Plan:  ?1. Left foot pain - Patient has persistent pain over the left long arch with evidence of abductor hallucis strain on MRI and evidence of partial tear on ultrasound today. Likely a consequence of poor arch support in the setting of her cavus feet. Discussed importance of arch support. Patient to continue use of green sports inserts with scaphoid padding and arch straps. Will consider custom orthotics. Formal PT order placed for arch strengthening.  ? ?Donald Pore ?MS4, [employer] ? ?Patient seen and evaluated with the medical student.  I agree with the above plan of care.  Ultrasound today shows hypoechoic changes within the body of the abductor hallucis which is her point of tenderness.  This correlates nicely with her previous MRI.  She has no tenderness over the sesamoids so the degenerative changes seen on MRI here are incidental.  Treatment as above with good arch support.  I did discuss the possibility of custom orthotics if symptoms do not improve.  She will follow-up for ongoing or recalcitrant issues. ?

## 2021-08-11 DIAGNOSIS — R69 Illness, unspecified: Secondary | ICD-10-CM | POA: Diagnosis not present

## 2021-08-11 DIAGNOSIS — F4310 Post-traumatic stress disorder, unspecified: Secondary | ICD-10-CM | POA: Diagnosis not present

## 2021-08-14 ENCOUNTER — Institutional Professional Consult (permissible substitution): Payer: Medicare HMO | Admitting: Neurology

## 2021-08-19 ENCOUNTER — Ambulatory Visit: Payer: Medicare HMO | Admitting: Clinical

## 2021-08-19 ENCOUNTER — Ambulatory Visit: Payer: Medicare HMO | Admitting: Neurology

## 2021-08-19 ENCOUNTER — Encounter: Payer: Self-pay | Admitting: Neurology

## 2021-08-19 VITALS — BP 118/72 | HR 70 | Ht 67.0 in | Wt 156.0 lb

## 2021-08-19 DIAGNOSIS — Z5181 Encounter for therapeutic drug level monitoring: Secondary | ICD-10-CM | POA: Diagnosis not present

## 2021-08-19 DIAGNOSIS — Z0389 Encounter for observation for other suspected diseases and conditions ruled out: Secondary | ICD-10-CM | POA: Diagnosis not present

## 2021-08-19 DIAGNOSIS — R0689 Other abnormalities of breathing: Secondary | ICD-10-CM

## 2021-08-19 DIAGNOSIS — F4310 Post-traumatic stress disorder, unspecified: Secondary | ICD-10-CM | POA: Diagnosis not present

## 2021-08-19 DIAGNOSIS — R69 Illness, unspecified: Secondary | ICD-10-CM | POA: Diagnosis not present

## 2021-08-19 DIAGNOSIS — Z79899 Other long term (current) drug therapy: Secondary | ICD-10-CM | POA: Diagnosis not present

## 2021-08-19 DIAGNOSIS — G4759 Other parasomnia: Secondary | ICD-10-CM | POA: Diagnosis not present

## 2021-08-19 DIAGNOSIS — R0683 Snoring: Secondary | ICD-10-CM | POA: Diagnosis not present

## 2021-08-19 DIAGNOSIS — G4719 Other hypersomnia: Secondary | ICD-10-CM

## 2021-08-19 DIAGNOSIS — G4753 Recurrent isolated sleep paralysis: Secondary | ICD-10-CM | POA: Diagnosis not present

## 2021-08-19 DIAGNOSIS — R799 Abnormal finding of blood chemistry, unspecified: Secondary | ICD-10-CM | POA: Diagnosis not present
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
## 2021-08-19 NOTE — Patient Instructions (Signed)
Please stop BUSPAR 2 weeks before sleep test for narcolepsy.  ?You may have a condition called narcolepsy: This means, that you have a sleep disorder that manifests with at times severe excessive sleepiness during the day and often with problems with sleep at night. We may have to try different medications that may help you stay awake during the day. Not everything works with everybody the same way. Wake promoting agents include stimulants and non-stimulant type medications. The most common side effects with stimulants are weight loss, insomnia, nervousness, headaches, palpitations, rise in blood pressure, anxiety. Stimulants can be addictive and subject to abuse. Non-stimulant type wake promoting medications include Provigil and Nuvigil, most common side effects include headaches, nervousness, insomnia, hypertension. In addition there is a medication called Xyrem which has been proven to be very effective in patients with narcolepsy with or without cataplexy. Some patients with narcolepsy report episodes of weakness, such as jaw or facial weakness, legs giving out, feeling wobbly or like "Jell-o", etc. in situations of anxiety, stress, laughter, sudden sadness, surprise, etc., which is called cataplexy. You can also experience episodes of sleep paralysis during which you may feel unable to move upon awakening. Some people experience dreamlike sequences upon awakening or upon drifting off to sleep, called hypnopompic or hypnagogic hallucinations. ? ? ? ?Hypersomnia ?Hypersomnia is a condition in which a person feels very tired during the day even though he or she gets plenty of sleep at night. A person with this condition may take naps during the day and may find it very difficult to wake up from sleep. Hypersomnia may affect a person's ability to think, concentrate, drive, or remember things. ?What are the causes? ?The cause of this condition may not be known. Possible causes include: ?Certain medicines. ?Sleep  disorders, such as narcolepsy and sleep apnea. ?Injury to the head, brain, or spinal cord. ?Drug or alcohol use. ?Gastroesophageal reflux disease (GERD). ?Tumors. ?Certain medical conditions, such as depression, diabetes, or an underactive thyroid gland (hypothyroidism). ?What are the signs or symptoms? ?The main symptoms of hypersomnia include: ?Feeling very tired throughout the day, regardless of how much sleep you got the night before. ?Having trouble waking up. Others may find it difficult to wake you up when you are sleeping. ?Sleeping for longer and longer periods at a time. ?Taking naps throughout the day. ?Other symptoms may include: ?Feeling restless, anxious, or annoyed. ?Lacking energy. ?Having trouble with: ?Remembering. ?Speaking. ?Thinking. ?Loss of appetite. ?Seeing, hearing, tasting, smelling, or feeling things that are not real (hallucinations). ?How is this diagnosed? ?This condition may be diagnosed based on: ?Your symptoms and medical history. ?Your sleeping habits. Your health care provider may ask you to write down your sleeping habits in a daily sleep log, along with any symptoms you have. ?A series of tests that are done while you sleep (sleep study or polysomnogram). ?A test that measures how quickly you can fall asleep during the day (daytime nap study or multiple sleep latency test). ?How is this treated? ?Treatment can help you manage your condition. Treatment may include: ?Following a regular sleep routine. ?Lifestyle changes, such as changing your eating habits, getting regular exercise, and avoiding alcohol or caffeinated beverages. ?Taking medicines to make you more alert (stimulants) during the day. ?Treating any underlying medical causes of hypersomnia. ?Follow these instructions at home: ?Sleep routine ? ?Schedule the same bedtime and wake-up time each day. ?Practice a relaxing bedtime routine. This may include reading, meditation, deep breathing, or taking a warm bath before going  to sleep. ?Get regular exercise each day. Avoid strenuous exercise in the evening hours. ?Keep your sleep environment at a cooler temperature, darkened, and quiet. ?Sleep with pillows and a mattress that are comfortable and supportive. ?Schedule short 20-minute naps for when you feel sleepiest during the day. ?Talk with your employer or teachers about your hypersomnia. If possible, adjust your schedule so that: ?You have a regular daytime work schedule. ?You can take a scheduled nap during the day. ?You do not have to work or be active at night. ?Do not eat a heavy meal for a few hours before bedtime. Eat your meals at about the same times every day. ?Avoid drinking alcohol or caffeinated beverages. ?Safety ? ?Do not drive or use heavy machinery if you are sleepy. Ask your health care provider if it is safe for you to drive. ?Wear a life jacket when swimming or spending time near water. ?General instructions ?Take supplements and over-the-counter and prescription medicines only as told by your health care provider. ?Keep a sleep log that will help your doctor manage your condition. This may include information about: ?What time you go to bed each night. ?How often you wake up at night. ?How many hours you sleep at night. ?How often and for how long you nap during the day. ?Any observations from others, such as leg movements during sleep, sleep walking, or snoring. ?Keep all follow-up visits as told by your health care provider. This is important. ?Contact a health care provider if: ?You have new symptoms. ?Your symptoms get worse. ?Get help right away if: ?You have serious thoughts about hurting yourself or someone else. ?If you ever feel like you may hurt yourself or others, or have thoughts about taking your own life, get help right away. You can go to your nearest emergency department or call: ?Your local emergency services (911 in the [place] ?A suicide crisis helpline, such as the [place] at 585-464-9077 or 988 in the [place]. This is open 24 hours a day. ?Summary ?Hypersomnia refers to a condition in which you feel very tired during the day even though you get plenty of sleep at night. ?A person with this condition may take naps during the day and may find it very difficult to wake up from sleep. ?Hypersomnia may affect a person's ability to think, concentrate, drive, or remember things. ?Treatment, such as following a regular sleep routine and making some lifestyle changes, can help you manage your condition. ?This information is not intended to replace advice given to you by your health care provider. Make sure you discuss any questions you have with your health care provider. ?Document Revised: 11/20/2020 Document Reviewed: 03/07/2020 ?Elsevier Patient Education ? [employer]. ? ?

## 2021-08-19 NOTE — Addendum Note (Signed)
Addended by: Larey Seat on: 08/19/2021 12:16 PM ? ? Modules accepted: Orders ? ?

## 2021-08-20 ENCOUNTER — Encounter: Payer: Self-pay | Admitting: Neurology

## 2021-08-20 ENCOUNTER — Ambulatory Visit: Payer: Medicare HMO | Admitting: Neurology

## 2021-08-20 VITALS — BP 138/66 | HR 86 | Ht 67.0 in | Wt 157.0 lb

## 2021-08-20 DIAGNOSIS — G43109 Migraine with aura, not intractable, without status migrainosus: Secondary | ICD-10-CM | POA: Diagnosis not present

## 2021-08-20 NOTE — Patient Instructions (Signed)
I think these events are migraine.  I do not suspect stroke/TIA or other concerning cause.   ?

## 2021-08-20 NOTE — Progress Notes (Signed)
? ?NEUROLOGY FOLLOW UP OFFICE NOTE ? ?Donna [name] ?[id number] ? ?Assessment/Plan:  ? ?Ocular migraines/migraine without aura.  Do not suspect cerebrovascular etiology (imaging ok), not consistent with myasthenia gravis and these are habitual spells with history of migraine. ? ?At this time, they are infrequent and manageable.  Nothing to do at this time.  Will follow up as needed. ? ?Subjective:  ?Donna [name] is a 71 year old female with essential tremor, anxiety, depression, IBS and history of BCC who follows up for vision problems. ? ?In 2018, she had episode of vertical diplopia and looked like she was looking through a halo.  Lasted 2 minutes.  No associated headache, slurred speech, facial droop or unilateral numbness or weakness. She had a recurrent spell in 2022 and then another one two weeks ago in her kitchen.  Never closed either eye during a spell to see if double vision resolved. She saw ophthalmology.  Exam reportedly unremarkable.   ? ?Sometimes has an intermittent paroxysmal squeezing pain on the left side of head.   ? ?She reports remote history of migraines with severe headache, nausea, photophobia and phonophobia.  Not for many years..   ? ?CTA head and neck on 02/02/2021 showed generalized arterial tortuosity in the upper chest and neck but mild extracranial atherosclerosis, up to moderate bilateral ICA siphon calcified plaque but no significant carotid stenosis, proximal left subclavian artery plaque with up to 50% stenosis but no other significant arterial stenosis in the head or neck.MRI of brain with and without contrast on 03/06/2021 showed mild chronic small vessel ischemic changes but otherwise unremarkable.   ? ?PAST MEDICAL HISTORY: ?Past Medical History:  ?Diagnosis Date  ? Anxiety   ? Asthma   ? Basal cell carcinoma 06/29/2017  ? right lip - CX3 + 5FU  ? Cataract   ? bilateral lens implants  ? Cough variant asthma   ? Depression   ? GERD (gastroesophageal reflux disease)   ?  Heart murmur   ? Hiatal hernia   ? Hyperlipidemia   ? IBS (irritable bowel syndrome)   ? 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 chest - CX3 + 5FU  ? Squamous cell carcinoma of skin 09/27/2014  ? left thigh - tx p bx  ? Substance abuse ([facility] [facility])   ? ? ?MEDICATIONS: ?Current Outpatient Medications on File Prior to Visit  ?Medication Sig Dispense Refill  ? b complex vitamins tablet Take 1 tablet by mouth daily.      ? busPIRone (BUSPAR) 5 MG tablet Take 5-10 mg by mouth 2 (two) times daily as needed.    ? calcium gluconate 500 MG tablet Take 500 mg by mouth daily.    ? estradiol (ESTRACE) 0.1 MG/GM vaginal cream SMARTSIG:1 Applicator Vaginal Once a Week    ? FLOVENT HFA 44 MCG/ACT inhaler Inhale 1 puff into the lungs in the morning and at bedtime. 10.6 g 5  ? imiquimod (ALDARA) 5 % cream APPLY TO AFFECTED AREA ON THE LEG M-W-F FOR 8 WKS 6 each 0  ? lisinopril (ZESTRIL) 5 MG tablet TAKE 1 TABLET BY MOUTH DAILY FOR ELEVATED BP WHILE TRAVELING 90 tablet 1  ? Loratadine (CLARITIN PO) Take by mouth.    ? Magnesium 500 MG TABS Take 1 tablet by mouth daily.      ? Multiple Vitamin (MULTIVITAMIN) capsule Take 1 capsule by mouth daily.      ? propranolol (INDERAL) 10 MG tablet Take 10 mg by mouth  3 (three) times daily.    ? Spacer/Aero-Holding Chambers (AEROCHAMBER PLUS) inhaler Use as instructed 1 each 2  ? valACYclovir (VALTREX) 500 MG tablet ONE BY MOUTH TWICE A DAY X 5 DAYS FOR HERPES OUTBREAK (Patient taking differently: 2 (two) times daily as needed. ONE BY MOUTH TWICE A DAY X 5 DAYS FOR HERPES OUTBREAK) 10 tablet prn  ? ?Current Facility-Administered Medications on File Prior to Visit  ?Medication Dose Route Frequency Provider Last Rate Last Admin  ? 0.9 %  sodium chloride infusion  500 mL Intravenous Continuous Ladene Artist, MD      ? ? ?ALLERGIES: ?Allergies  ?Allergen Reactions  ? Ciprofloxacin   ?  Neuropathy   ? Doxycycline Nausea Only  ? Gluten Meal   ?  GI upset   ? Penicillins Nausea  And Vomiting  ?  "breaking out"  ? Quinolones Other (See Comments)  ?  REACTION: sick / neuropathy ?  ? ? ?FAMILY HISTORY: ?Family History  ?Problem Relation Age of Onset  ? Dementia Mother   ? Diabetes Mother   ? Melanoma Father   ? Colon cancer Father 50  ?     Survived  ? Heart disease Father   ? Heart attack Father   ? Rheum arthritis Father   ? Dementia Father   ? Healthy Sister   ? Healthy Brother   ? Dementia Maternal Aunt   ? Dementia Maternal Uncle   ? Colon polyps Paternal Aunt   ? Pancreatic cancer Paternal Aunt   ? Rectal cancer Paternal Aunt   ? Colon cancer Paternal Aunt   ? Colon cancer Paternal Grandmother   ? Breast cancer Maternal Grandmother   ? Stomach cancer Neg Hx   ? Esophageal cancer Neg Hx   ? Liver cancer Neg Hx   ? ? ?  ?Objective:  ?Blood pressure 138/66, pulse 86, height '5\' 7"'$  (1.702 m), weight 157 lb (71.2 kg), SpO2 99 %. ?General: No acute distress.  Patient appears well-groomed.   ?Head:  Normocephalic/atraumatic ?Eyes:  Fundi examined but not visualized ?Neck: supple, no paraspinal tenderness, full range of motion ?Heart:  Regular rate and rhythm ?Lungs:  Clear to auscultation bilaterally ?Back: No paraspinal tenderness ?Neurological Exam: alert and oriented to person, place, and time.  Speech fluent and not dysarthric, language intact.  CN II-XII intact. Bulk and tone normal, muscle strength 5/5 throughout.  Sensation to light touch intact.  Deep tendon reflexes 2+ throughout, toes downgoing.  Finger to nose testing intact.  Gait normal, Romberg negative. ? ? ?Metta Clines, DO ? ?CC: [name], DO ? ? ? ? ? ? ?

## 2021-08-22 NOTE — Patient Instructions (Addendum)
Asthma:  ?Not able to tolerate Alvesco 80 mcg due to side effects of headaches, nose bleeds and heart racing. Does not wish to start Singulair due to already seeing a therapist for her mood. Insurance did not cover Qvar. Flovent causes headaches ?Daily controller medication(s):Start Pulmicort (budesonide) 0.5 mg once a day to help prevent cough and wheeze. Nebulizer given along with demonstration ?May use levoalbuterol rescue inhaler 2 puffs every 4 to 6 hours as needed for shortness of breath, chest tightness, coughing, and wheezing. May use levoalbuterol rescue inhaler 2 puffs 5 to 15 minutes prior to strenuous physical activities. Monitor frequency of use.  ?Asthma control goals:  ?Full participation in all desired activities (may need albuterol before activity) ?Albuterol use two times or less a week on average (not counting use with activity) ?Cough interfering with sleep two times or less a month ?Oral steroids no more than once a year ?No hospitalizations  ? ?Chronic Rhinitis:(negative skin testing in 2021 at previous allergist) ?Use over the counter antihistamines such as Zyrtec (cetirizine), Claritin (loratadine), Allegra (fexofenadine), or Xyzal (levocetirizine) daily as needed. May switch antihistamines every few months. ?May use nasal saline spray (i.e., Simply Saline) as needed.  ? ?Gluten intolerance: ?Continue to avoid gluten. ?For mild symptoms you can take over the counter antihistamines such as Benadryl and monitor symptoms closely. If symptoms worsen or if you have severe symptoms including breathing issues, throat closure, significant swelling, whole body hives, severe diarrhea and vomiting, lightheadedness then seek immediate medical care. ? ?Drug allergies: ?Continue to avoid drugs on the allergy list.  ? ?Follow up in 2 months  or sooner if needed.  ? ?

## 2021-08-25 ENCOUNTER — Ambulatory Visit: Payer: Medicare HMO | Admitting: Family

## 2021-08-25 ENCOUNTER — Encounter: Payer: Self-pay | Admitting: Family

## 2021-08-25 VITALS — BP 120/60 | HR 71 | Temp 98.2°F | Resp 12 | Ht 67.0 in | Wt 158.4 lb

## 2021-08-25 DIAGNOSIS — J45909 Unspecified asthma, uncomplicated: Secondary | ICD-10-CM | POA: Diagnosis not present

## 2021-08-25 DIAGNOSIS — J45998 Other asthma: Secondary | ICD-10-CM | POA: Diagnosis not present

## 2021-08-25 DIAGNOSIS — Z889 Allergy status to unspecified drugs, medicaments and biological substances status: Secondary | ICD-10-CM

## 2021-08-25 DIAGNOSIS — K9041 Non-celiac gluten sensitivity: Secondary | ICD-10-CM

## 2021-08-25 DIAGNOSIS — J310 Chronic rhinitis: Secondary | ICD-10-CM | POA: Diagnosis not present

## 2021-08-25 MED ORDER — BUDESONIDE 0.5 MG/2ML IN SUSP: 0.5000 mg | Freq: Every day | RESPIRATORY_TRACT | 2 refills | Status: DC

## 2021-08-25 NOTE — Progress Notes (Signed)
? ?[place] [address] ?Dept: [phone] ? ?FOLLOW UP NOTE ? ?Patient ID: Donna [name], female    DOB: 1950-11-05  Age: 71 y.o. MRN: [id number] ?Date of Office Visit: 08/25/2021 ? ?Assessment  ?Chief Complaint: Asthma (Been pretty good. Only had to use inhaler one time outside of before her walks.), Allergic Rhinitis , and Other (Gets headaches when using Flovent so she doesn't use it regularly.) ? ?HPI ?Donna [name] is a 71 year old female who presents today for follow-up of asthma, chronic rhinitis, gluten intolerance, and multiple drug allergies.  She was last seen on June 23, 2021 by myself.  Since her last office visit she reports that on March 13 she had radiofrequency for her cervical spine and the  week prior to that she had a colonoscopy that she reports showed no polyps. ? ?Asthma is reported as doing better with albuterol as needed.  She has tried Alvesco 80 mcg in the past and had side effects of headaches, nosebleeds, and heart racing.  Her insurance did not cover Qvar. Flovent 44 mcg 1 puff twice a day caused headaches.  She is not interested in trying Singulair 10 mg once a day due to the side effects of changes in mood.  She reports cough[place], but this has been better.  She also reports her wheezing is so much better than what it used to be.  She denies tightness in chest, shortness of breath, and nocturnal awakenings due to breathing problems.  Since her last office visit she has not required any systemic steroids or made any trips to the emergency room or urgent care due to breathing problems.  She has only had to use her albuterol inhaler once besides before walking. ? ?Chronic rhinitis is reported as doing pretty good for this time of the year.  She takes Claritin 24-hour once a day.  She is not able to tolerate nose sprays due to nosebleeds.  She reports a little bit of rhinorrhea this past [place]  and denies nasal congestion and postnasal drip.  She has not had any sinus infections since we last saw her.  Her previous skin testing in 2021 at a previous allergist office was negative.  She reports that 6 months prior to that skin testing 2021 that Dr. Melvyn Novas, a pulmonologist, had done blood work and it was negative also. ? ?She continues to avoid gluten without any problems.  She also continues to avoid drugs on her allergy list. ? ?Drug Allergies:  ?Allergies  ?Allergen Reactions  ? Ciprofloxacin   ?  Neuropathy   ? Doxycycline Nausea Only  ? Gluten Meal   ?  GI upset   ? Penicillins Nausea And Vomiting  ?  "breaking out"  ? Quinolones Other (See Comments)  ?  REACTION: sick / neuropathy ?  ? ? ?Review of Systems: ?Review of Systems  ?Constitutional:  Negative for chills and fever.  ?HENT:    ?     Reports a little bit of rhinorrhea while she was in the mountains this weekend. Denies nasal congestion and post nasal drip  ?Eyes:   ?     Reports itchy eyes for which she uses Refresh eye drops  ?Respiratory:  Positive for cough. Negative for shortness of breath and wheezing.   ?     Reports a little bit of coughing this weekend while [place] the mountains.  She reports her wheezing is much  better than it used to be.  Denies tightness in chest, shortness of breath, and nocturnal awakenings due to breathing problems  ?Cardiovascular:  Negative for chest pain and palpitations.  ?Gastrointestinal:   ?     Denies heartburn or reflux symptoms  ?Genitourinary:  Negative for frequency.  ?Skin:  Negative for itching and rash.  ?     Reports that she is currently being treated for skin cancer with a cream to be used on her left lower leg  ?Neurological:  Positive for headaches.  ?     Reports headaches when using Flovent 44 mcg 1 puff twice a day  ?Endo/Heme/Allergies:  Negative for environmental allergies.  ? ? ?Physical Exam: ?BP 120/60   Pulse 71   Temp 98.2 ?F (36.8 ?C) (Temporal)   Resp 12   Ht '5\' 7"'$  (1.702 m)   Wt 158  lb 6.4 oz (71.8 kg)   SpO2 97%   BMI 24.81 kg/m?   ? ?Physical Exam ?Constitutional:   ?   Appearance: Normal appearance.  ?HENT:  ?   Head: Normocephalic and atraumatic.  ?   Comments: Pharynx normal. Eyes normal. Ears normal. Nose: normal ?   Right Ear: Tympanic membrane, ear canal and external ear normal.  ?   Left Ear: Tympanic membrane, ear canal and external ear normal.  ?   Nose: Nose normal.  ?   Mouth/Throat:  ?   Mouth: Mucous membranes are moist.  ?   Pharynx: Oropharynx is clear.  ?Eyes:  ?   Conjunctiva/sclera: Conjunctivae normal.  ?Cardiovascular:  ?   Rate and Rhythm: Normal rate and regular rhythm.  ?   Heart sounds: Normal heart sounds.  ?Pulmonary:  ?   Effort: Pulmonary effort is normal.  ?   Breath sounds: Normal breath sounds.  ?   Comments: Lungs clear to auscultation ?Musculoskeletal:  ?   Cervical back: Neck supple.  ?Skin: ?   General: Skin is warm.  ?Neurological:  ?   Mental Status: She is alert and oriented to person, place, and time.  ?Psychiatric:     ?   Mood and Affect: Mood normal.     ?   Behavior: Behavior normal.     ?   Thought Content: Thought content normal.     ?   Judgment: Judgment normal.  ? ? ?Diagnostics: ?FVC 2.25 L (71%), FEV1 1.42 L, (58%).  Predicted FVC 3.17 L, predicted FEV1 2.44 L.  Spirometry indicates possible moderately severe obstruction and restriction.  Postbronchodilator response shows FVC 2.13 L, FEV1 0.91 L.  Spirometry indicates possible severe obstruction and restriction. No change in FEV1 noted. Poor technique noted ? ?Assessment and Plan: ?1. Asthma, unspecified asthma severity, unspecified whether complicated, unspecified whether persistent   ?2. Chronic rhinitis   ?3. Gluten intolerance   ?4. Multiple drug allergies   ? ? ?Meds ordered this encounter  ?Medications  ? budesonide (PULMICORT) 0.5 MG/2ML nebulizer solution  ?  Sig: Take 2 mLs (0.5 mg total) by nebulization daily.  ?  Dispense:  60 mL  ?  Refill:  2  ? ? ?Patient Instructions  ?Asthma:   ?Not able to tolerate Alvesco 80 mcg due to side effects of headaches, nose bleeds and heart racing. Does not wish to start Singulair due to already seeing a therapist for her mood. Insurance did not cover Qvar. Flovent causes headaches ?Daily controller medication(s):Start Pulmicort (budesonide) 0.5 mg once a day to help prevent cough and wheeze. Nebulizer given  along with demonstration ?May use levoalbuterol rescue inhaler 2 puffs every 4 to 6 hours as needed for shortness of breath, chest tightness, coughing, and wheezing. May use levoalbuterol rescue inhaler 2 puffs 5 to 15 minutes prior to strenuous physical activities. Monitor frequency of use.  ?Asthma control goals:  ?Full participation in all desired activities (may need albuterol before activity) ?Albuterol use two times or less a week on average (not counting use with activity) ?Cough interfering with sleep two times or less a month ?Oral steroids no more than once a year ?No hospitalizations  ? ?Chronic Rhinitis:(negative skin testing in 2021 at previous allergist) ?Use over the counter antihistamines such as Zyrtec (cetirizine), Claritin (loratadine), Allegra (fexofenadine), or Xyzal (levocetirizine) daily as needed. May switch antihistamines every few months. ?May use nasal saline spray (i.e., Simply Saline) as needed.  ? ?Gluten intolerance: ?Continue to avoid gluten. ?For mild symptoms you can take over the counter antihistamines such as Benadryl and monitor symptoms closely. If symptoms worsen or if you have severe symptoms including breathing issues, throat closure, significant swelling, whole body hives, severe diarrhea and vomiting, lightheadedness then seek immediate medical care. ? ?Drug allergies: ?Continue to avoid drugs on the allergy list.  ? ?Follow up in 2 months  or sooner if needed.  ? ? ?Return in about 2 months (around 10/25/2021), or if symptoms worsen or fail to improve. ?  ? ?Thank you for the opportunity to care for this patient.   Please do not hesitate to contact me with questions. ? ?Althea Charon, FNP ?[facility] ? ? ? ? ?

## 2021-08-27 ENCOUNTER — Ambulatory Visit: Payer: Medicare HMO | Admitting: Physical Therapy

## 2021-08-28 DIAGNOSIS — R69 Illness, unspecified: Secondary | ICD-10-CM | POA: Diagnosis not present

## 2021-08-28 DIAGNOSIS — F4310 Post-traumatic stress disorder, unspecified: Secondary | ICD-10-CM | POA: Diagnosis not present

## 2021-08-29 LAB — NARCOLEPSY EVALUATION
DQA1*01:02: POSITIVE
DQB1*06:02: POSITIVE

## 2021-09-01 ENCOUNTER — Encounter: Payer: Self-pay | Admitting: Neurology

## 2021-09-02 NOTE — Telephone Encounter (Signed)
Called [facility] and provided additional ICD codes for them to attempt to re-bill the HLA lab for the pt. Hopefully this will allow for coverage and help with the bill. The representative re processed for the patient and it can take up to 30-45 days for the insurance to answer. The patient will get a notification once they have reviewed. Invoice is on hold.  ?

## 2021-09-03 ENCOUNTER — Encounter: Payer: Self-pay | Admitting: Physical Therapy

## 2021-09-03 ENCOUNTER — Ambulatory Visit: Payer: Medicare HMO | Attending: Sports Medicine | Admitting: Physical Therapy

## 2021-09-03 DIAGNOSIS — R262 Difficulty in walking, not elsewhere classified: Secondary | ICD-10-CM | POA: Diagnosis not present

## 2021-09-03 DIAGNOSIS — M79672 Pain in left foot: Secondary | ICD-10-CM | POA: Diagnosis not present

## 2021-09-03 NOTE — Therapy (Signed)
?[facility] [address] ?[place], [place], [address] ?Phone: [phone]   Fax:  [phone] ? ?Physical Therapy Evaluation ? ?Patient Details  ?Name: Donna [name] ?MRN: [id number] ?Date of Birth: 12-02-1950 ?Referring Provider (PT): Renata Caprice ? ? ?Encounter Date: 09/03/2021 ? ? PT End of Session - 09/03/21 0017   ? ? Visit Number 1   ? Date for PT Re-Evaluation 12/03/21   ? Authorization Type [place] [facility] does not cover ionto   ? PT Start Time 0750   ? PT Stop Time 0840   ? PT Time Calculation (min) 50 min   ? Activity Tolerance Patient tolerated treatment well   ? Behavior During Therapy [facility] for tasks assessed/performed   ? ?  ?  ? ?  ? ? ?Past Medical History:  ?Diagnosis Date  ? Anxiety   ? Asthma   ? Basal cell carcinoma 06/29/2017  ? right lip - CX3 + 5FU  ? Cataract   ? bilateral lens implants  ? Cough variant asthma   ? Depression   ? GERD (gastroesophageal reflux disease)   ? Heart murmur   ? Hiatal hernia   ? Hyperlipidemia   ? IBS (irritable bowel syndrome)   ? 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 chest - CX3 + 5FU  ? Squamous cell carcinoma of skin 09/27/2014  ? left thigh - tx p bx  ? Substance abuse ([place])   ? ? ?Past Surgical History:  ?Procedure Laterality Date  ? ANKLE SURGERY    ? Bilateral   ? CATARACT EXTRACTION Bilateral   ? CERVICAL SPINE SURGERY  07/21/2021  ? TUBAL LIGATION    ? ? ?There were no vitals filed for this visit. ? ? ? Subjective Assessment - 09/03/21 0755   ? ? Subjective Patient reports that she has been having left foot pain possibly a year, she reports that [place] and felt some pain, reports that ove the past few months she was having increased pain with being barefoot and some yoga poses.  She was having pain with DF as well.  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   ? Diagnostic tests hallux  sesamoid-metatarsal articulation degenerative changes, US revealed a year in the abductor hallicus   ? Patient Stated Goals be stronger, have less pain, do yoga without shoes, how do I take care of this   ? Currently in Pain? No/denies   ? Pain Score 0-No pain   ? Pain Location Foot   ? Pain Orientation Left   ? Pain Descriptors / Indicators Aching;Sore;Tightness   ? Pain Type Acute pain   ? Pain Onset More than a month ago   ? Pain Frequency Intermittent   ? Aggravating Factors  stretching toes back, DF not wearing shoes all increase pain over the past few months pain up 6/10, was up to 10/10   ? Pain Relieving Factors wearing shoes, rest, inserts all help   ? 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 ?  ?  ? ?  ? ? ? ? ? [facility] PT Assessment - 09/03/21 0001   ? ?  ? Assessment  ? Medical Diagnosis left foot pain   ?  Referring Provider (PT) Renata Caprice   ? Onset Date/Surgical Date 07/06/21   ?  ? Precautions  ? Precautions None   ?  ? Balance Screen  ? Has the patient fallen in the past 6 months No   ? Has the patient had a decrease in activity level because of a fear of falling?  No   ? Is the patient reluctant to leave their home because of a fear of falling?  No   ?  ? Home Environment  ? Additional Comments likes gardening   ?  ? Prior Function  ? Level of Independence Independent   ? Vocation Retired   ? 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  ? ROM / Strength  ? AROM / PROM / Strength AROM;Strength;PROM   ?  ? AROM  ? AROM Assessment Site Ankle   ? Right/Left Ankle Left   ? Left Ankle Dorsiflexion 5   pain in the arch  ? Left Ankle Plantar Flexion 35   ? Left Ankle Inversion 20   ? Left Ankle Eversion 10   ?  ? PROM  ? Overall PROM Comments passively to 5 degrees DF with pain in the arch   ?  ? Strength  ? Overall Strength Comments DF and eversion cause pain in the arch    ?  ? Palpation  ? Palpation comment very tender along the long arch (PF) area mid area   ?  ? Ambulation/Gait  ? Gait Comments has a limp on the left when walking without shoes, , she reports having some pain in the left hip with walking, she is tight in the piriformis   ? ?  ?  ? ?  ? ? ? ? ? ? ? ? ? ? ? ? ? ?Objective measurements completed on examination: See above findings.  ? ? ? ? ? ? ? ? ? ? ? ? ? ? PT Education - 09/03/21 0840   ? ? Education Details gave handout for gentle great toe extension, self massage gentle, towel scrunch and piriformis stretch   ? Person(s) Educated Patient   ? Methods Explanation;Demonstration;Tactile cues;Verbal cues;Handout   ? Comprehension Verbalized understanding   ? ?  ?  ? ?  ? ? ? PT Short Term Goals - 09/03/21 1046   ? ?  ? PT SHORT TERM GOAL #1  ? Title independent with initial HEP   ? Time 2   ? Period Weeks   ? Status New   ? ?  ?  ? ?  ? ? ? ? PT Long Term Goals - 09/03/21 1046   ? ?  ? PT LONG TERM GOAL #1  ? Title understand proper posture and body mechnaics/self care   ? Time 12   ? Period Weeks   ? Status New   ?  ? PT LONG TERM GOAL #2  ? Title increase left ankle ROM to WNL's   ? Time 12   ? Period Weeks   ? Status New   ?  ? PT LONG TERM GOAL #3  ? 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 Time 12   ? Period Weeks   ? Status New   ?  ? PT LONG TERM GOAL #4  ? Title report 50% less pain with walking and gardening   ? Time 12   ? Period Weeks   ? Status New   ? ?  ?  ? ?  ? ? ? ? ? ? ? ? ?  Plan - 09/03/21 0843   ? ? Clinical Impression Statement Patient reports some left foot pain since July 2022, she reports that an MRI showed some degenerative changes, reports that an US showed a tear in the abductor hallucis.  She has very tight plantar area of the foot and with stretching this and DF she reports a significant tearing feeling.  She has smaller steps and a slight limp, she has tightness in the left piriformis mm.  She is missing ROM for DF.   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  Does better wearing shoes with orthotics.   ? Stability/Clinical Decision Making Evolving/Moderate complexity   ? Clinical Decision Making Low   ? Rehab Potential Good   ? PT Frequency 1x / week   ? PT Duration 12 weeks   ? PT Treatment/Interventions ADLs/Self Care Home Management;Cryotherapy;Electrical Stimulation;Iontophoresis '4mg'$ /ml Dexamethasone;Ultrasound;Gait training;Stair training;Functional mobility training;Therapeutic activities;Therapeutic exercise;Balance training;Neuromuscular re-education;Manual techniques;Patient/family education;Vasopneumatic Device   ? PT Next Visit Plan try STM and vaso and gentle STM   ? Consulted and Agree with Plan of Care Patient   ? ?  ?  ? ?  ? ? ?Patient will benefit from skilled therapeutic intervention in order to improve the following deficits and impairments:  Abnormal gait, Difficulty walking, Decreased range of motion, Decreased endurance, Pain, Decreased balance, Impaired flexibility, Decreased strength, Decreased mobility ? ?Visit Diagnosis: ?Pain in left foot - Plan: PT plan of care cert/re-cert ? ?Difficulty in walking, not elsewhere classified - Plan: PT plan of care cert/re-cert ? ? ? ? ?Problem List ?Patient Active Problem List  ? Diagnosis Date Noted  ? Gasping for breath 08/19/2021  ? PTSD (post-traumatic stress disorder) 08/19/2021  ? Parasomnia overlap disorder 08/19/2021  ? Snoring 08/19/2021  ? Excessive daytime sleepiness 08/19/2021  ? Sleep paralysis, recurrent isolated 08/19/2021  ? Asthma 02/19/2021  ? Multiple drug allergies 02/19/2021  ? Gluten intolerance 02/19/2021  ? Chronic rhinitis 02/19/2021  ? Metatarsalgia of left foot 11/14/2020  ? Achilles tendinosis of right lower extremity 11/14/2020  ? Essential tremor 07/04/2019  ? DOE (dyspnea on exertion) 01/20/2019  ? 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 06/13/2018  ? Keratoacanthoma type squamous cell carcinoma of  skin 06/13/2018  ? Superficial basal cell carcinoma 06/13/2018  ? Upper airway cough syndrome 06/13/2018  ? Atrophic vaginitis 02/17/2018  ? Bilateral leg cramps 02/17/2018  ? Fatty liver, alcoholic 79/

## 2021-09-08 ENCOUNTER — Ambulatory Visit: Payer: Medicare HMO | Attending: Sports Medicine | Admitting: Physical Therapy

## 2021-09-08 ENCOUNTER — Encounter: Payer: Self-pay | Admitting: Physical Therapy

## 2021-09-08 DIAGNOSIS — R262 Difficulty in walking, not elsewhere classified: Secondary | ICD-10-CM

## 2021-09-08 DIAGNOSIS — M79672 Pain in left foot: Secondary | ICD-10-CM

## 2021-09-08 NOTE — Therapy (Signed)
[facility] ?[address] ?[place], [place], [address] ?Phone: [phone]   Fax:  [phone] ? ?Physical Therapy Treatment ? ?Patient Details  ?Name: Donna [name] ?MRN: [id number] ?Date of Birth: December 11, 1950 ?Referring Provider (PT): Renata Caprice ? ? ?Encounter Date: 09/08/2021 ? ? PT End of Session - 09/08/21 3151   ? ? Visit Number 2   ? Date for PT Re-Evaluation 12/03/21   ? Authorization Type [place] [facility] does not cover ionto   ? PT Start Time 0750   ? PT Stop Time [phone]   ? PT Time Calculation (min) 43 min   ? Activity Tolerance Patient tolerated treatment well   ? Behavior During Therapy [facility] for tasks assessed/performed   ? ?  ?  ? ?  ? ? ?Past Medical History:  ?Diagnosis Date  ? Anxiety   ? Asthma   ? Basal cell carcinoma 06/29/2017  ? right lip - CX3 + 5FU  ? Cataract   ? bilateral lens implants  ? Cough variant asthma   ? Depression   ? GERD (gastroesophageal reflux disease)   ? Heart murmur   ? Hiatal hernia   ? Hyperlipidemia   ? IBS (irritable bowel syndrome)   ? 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 chest - CX3 + 5FU  ? Squamous cell carcinoma of skin 09/27/2014  ? left thigh - tx p bx  ? Substance abuse ([place])   ? ? ?Past Surgical History:  ?Procedure Laterality Date  ? ANKLE SURGERY    ? Bilateral   ? CATARACT EXTRACTION Bilateral   ? CERVICAL SPINE SURGERY  07/21/2021  ? TUBAL LIGATION    ? ? ?There were no vitals filed for this visit. ? ? Subjective Assessment - 09/08/21 0755   ? ? Subjective Started the massage, very sore but feels like it needed it   ? Currently in Pain? No/denies   ? ?  ?  ? ?  ? ? ? ? ? ? ? ? ? ? ? ? ? ? ? ? ? ? ? ? [facility] Adult PT Treatment/Exercise - 09/08/21 0001   ? ?  ? Exercises  ? Exercises Ankle   ?  ? Manual Therapy  ? Manual Therapy Soft tissue mobilization;Passive ROM;Joint mobilization   ? Joint Mobilization metatarsal mobs   ? Soft tissue mobilization to the plantar surface and the dorsum b/n  toes   ? Passive ROM great toes and ankle   ?  ? Ankle Exercises: Stretches  ? Gastroc Stretch 10 seconds   ? Gastroc Stretch Limitations mostly foot pain   ?  ? Ankle Exercises: Aerobic  ? Recumbent Bike level 2 x 5 minute   ? ?  ?  ? ?  ? ? ? ? ? ? ? ? ? ? ? ? PT Short Term Goals - 09/08/21 0834   ? ?  ? PT SHORT TERM GOAL #1  ? Title independent with initial HEP   ? Status Partially Met   ? ?  ?  ? ?  ? ? ? ? PT Long Term Goals - 09/03/21 1046   ? ?  ? PT LONG TERM GOAL #1  ? Title understand proper posture and body mechnaics/self care   ? Time 12   ? Period Weeks   ? Status New   ?  ? PT LONG TERM GOAL #2  ? Title increase left ankle ROM to WNL's   ?  Time 12   ? Period Weeks   ? Status New   ?  ? PT LONG TERM GOAL #3  ? 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 Time 12   ? Period Weeks   ? Status New   ?  ? PT LONG TERM GOAL #4  ? Title report 50% less pain with walking and gardening   ? Time 12   ? Period Weeks   ? Status New   ? ?  ?  ? ?  ? ? ? ? ? ? ? ? Plan - 09/08/21 0828   ? ? Clinical Impression Statement Pateint overall reports that she feels a bit better, she could not tolerate the gastroc stretch this caused the mid foot pain.  She was less tender along the plantar surface but still tender.,, Reviewed the HEP and she has some pain with the toe crunches with the towel  She remains rigid but seems to have some less pain   ? PT Next Visit Plan try STM and vaso and gentle STM   ? Consulted and Agree with Plan of Care Patient   ? ?  ?  ? ?  ? ? ?Patient will benefit from skilled therapeutic intervention in order to improve the following deficits and impairments:  Abnormal gait, Difficulty walking, Decreased range of motion, Decreased endurance, Pain, Decreased balance, Impaired flexibility, Decreased strength, Decreased mobility ? ?Visit Diagnosis: ?Pain in left foot ? ?Difficulty in walking, not elsewhere classified ? ? ? ? ?Problem List ?Patient Active Problem List  ? Diagnosis Date  Noted  ? Gasping for breath 08/19/2021  ? PTSD (post-traumatic stress disorder) 08/19/2021  ? Parasomnia overlap disorder 08/19/2021  ? Snoring 08/19/2021  ? Excessive daytime sleepiness 08/19/2021  ? Sleep paralysis, recurrent isolated 08/19/2021  ? Asthma 02/19/2021  ? Multiple drug allergies 02/19/2021  ? Gluten intolerance 02/19/2021  ? Chronic rhinitis 02/19/2021  ? Metatarsalgia of left foot 11/14/2020  ? Achilles tendinosis of right lower extremity 11/14/2020  ? Essential tremor 07/04/2019  ? DOE (dyspnea on exertion) 01/20/2019  ? 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 06/13/2018  ? 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  ? Superficial basal cell carcinoma 06/13/2018  ? Upper airway cough syndrome 06/13/2018  ? Atrophic vaginitis 02/17/2018  ? Bilateral leg cramps 02/17/2018  ? Fatty liver, alcoholic 61/60/7371  ? Depression 04/19/2016  ? Withdrawal complaint 04/19/2016  ? Hx of adenomatous colonic polyps 08/03/2015  ? Herpes simplex 06/15/2014  ? GERD (gastroesophageal reflux disease) 10/14/2013  ? Anxiety state, unspecified 10/14/2013  ? Recovering alcoholic in remission ([place]) 10/14/2013  ? Allergic rhinitis 10/14/2013  ? Adjustment disorder with anxiety 10/14/2013  ? Elevated low-density lipoprotein level 10/14/2013  ? Essential hypertension 10/15/2012  ? Heart murmur 05/25/2012  ? ? Sumner Boast, PT ?09/08/2021, 8:35 AM ? ?[facility] [address] ?[place], [place], [address] ?Phone: [phone]   Fax:  [phone] ? ?Name: Donna [name] ?MRN: [id number] ?Date of Birth: 1950/05/17 ? ? ? ?

## 2021-09-11 DIAGNOSIS — R69 Illness, unspecified: Secondary | ICD-10-CM | POA: Diagnosis not present

## 2021-09-11 DIAGNOSIS — F4310 Post-traumatic stress disorder, unspecified: Secondary | ICD-10-CM | POA: Diagnosis not present

## 2021-09-18 DIAGNOSIS — F4310 Post-traumatic stress disorder, unspecified: Secondary | ICD-10-CM | POA: Diagnosis not present

## 2021-09-18 DIAGNOSIS — R69 Illness, unspecified: Secondary | ICD-10-CM | POA: Diagnosis not present

## 2021-09-24 DIAGNOSIS — J45998 Other asthma: Secondary | ICD-10-CM | POA: Diagnosis not present

## 2021-09-29 NOTE — Progress Notes (Signed)
CARDIOLOGY CONSULT NOTE       Patient ID: Donna [name] MRN: [id number] DOB/AGE: 11-22-1950 71 y.o.  Admit date: (Not on file) Referring Physician: [place] Primary Physician: [name], DO Primary Cardiologist: Johnsie Cancel Reason for Consultation: Leg cramps, Dyspnea. Chest pain   Active Problems:   * No active hospital problems. *   HPI:  71 y.o. history of HTN and HLD. I saw her in 2014 for atypical chest pain She had normal ECG and normal nuclear study no infarct or ischemia EF 57% Previous ETOH abuse Rx [facility] At that time Sober since 2017 Rx inderal for withdrawal Was prescribed lisinopril 5 mg for BP but did not take. Has had essential tremor and saw Dr TAT 08/22/18 to r/o Parkinsons There is a family history of tremor Affects left arm mostly She declined Rx other than PRN inderal BP noted to be elevated but she indicated only taking inderal and lisinopril as needed  ABI"s done 11/15/18 were normal with no signs of PVD   She sees Dr Renold Genta for primary care. Only taking crestor once/week Having more exertional dyspnea Is a yoga instructor Currently married for 3rd time   Myovue 08/31/19 normal EF 59% Echo 09/11/20 EF 55-60% mild MR mild AR tri leaflet valve aortic root 4.0 cm  Getting physical Rx for left foot pain MRI 06/07/21 with arthritis and some muscle strain in abductor hallucis muscle   C spine surgery March 2023 and sees Dohmeier for sleep issues   TTE today 10/08/2021 showed low normal EF trivial MR/AR and aortic root 4.0 cm   She has had some asthma and allergies this pollen season and has been seeing pulmonary  ROS All other systems reviewed and negative except as noted above  Past Medical History:  Diagnosis Date   Anxiety    Asthma    Basal cell carcinoma 06/29/2017   right lip - CX3 + 5FU   Cataract    bilateral lens implants   Cough variant asthma    Depression    GERD (gastroesophageal reflux disease)    Heart murmur    Hiatal hernia     Hyperlipidemia    IBS (irritable bowel syndrome)    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   chest - CX3 + 5FU   Squamous cell carcinoma of skin 09/27/2014   left thigh - tx p bx   Substance abuse ([place])     Family History  Problem Relation Age of Onset   Dementia Mother    Diabetes Mother    Melanoma Father    Colon cancer Father 67       Survived   Heart disease Father    Heart attack Father    Rheum arthritis Father    Dementia Father    Healthy Sister    Healthy Brother    Dementia Maternal Aunt    Dementia Maternal Uncle    Colon polyps Paternal Aunt    Pancreatic cancer Paternal Aunt    Rectal cancer Paternal Aunt    Colon cancer Paternal Aunt    Colon cancer Paternal Grandmother    Breast cancer Maternal Grandmother    Stomach cancer Neg Hx    Esophageal cancer Neg Hx    Liver cancer Neg Hx     Social History   Socioeconomic History   Marital status: Married    Spouse name: [name]   Number of children: 0   Years of education: Not on file  Highest education level: Associate degree: academic program  Occupational History   Occupation: [employer]  Tobacco Use   Smoking status: Former    Years: 30.00    Types: Cigarettes    Quit date: 10/15/1996    Years since quitting: 24.9   Smokeless tobacco: Never  Vaping Use   Vaping Use: Never used  Substance and Sexual Activity   Alcohol use: No   Drug use: No   Sexual activity: Yes  Other Topics Concern   Not on file  Social History Narrative   Lives with husband   Right handed   1-2 caffeine drinks daily   Social Determinants of Health   Financial Resource Strain: Not on file  Food Insecurity: Not on file  Transportation Needs: Not on file  Physical Activity: Not on file  Stress: Not on file  Social Connections: Not on file  Intimate Partner Violence: Not on file    Past Surgical History:  Procedure Laterality Date   ANKLE SURGERY     Bilateral    CATARACT EXTRACTION  Bilateral    CERVICAL SPINE SURGERY  07/21/2021   TUBAL LIGATION        Physical Exam: There were no vitals taken for this visit.    Affect appropriate Healthy:  appears stated age 71: normal Neck supple with no adenopathy JVP normal no bruits no thyromegaly Lungs clear with no wheezing and good diaphragmatic motion Heart:  S1/S2 no murmur, no rub, gallop or click PMI normal Abdomen: benighn, BS positve, no tenderness, no AAA no bruit.  No HSM or HJR Distal pulses intact with no bruits No edema Neuro non-focal UE tremors  Skin warm and dry No muscular weakness   Labs:   Lab Results  Component Value Date   WBC 3.6 (L) 02/02/2021   HGB 11.7 (L) 02/02/2021   HCT 34.5 (L) 02/02/2021   MCV 88.0 02/02/2021   PLT 230 02/02/2021   No results for input(s): NA, K, CL, CO2, BUN, CREATININE, CALCIUM, PROT, BILITOT, ALKPHOS, ALT, AST, GLUCOSE in the last 168 hours.  Invalid input(s): LABALBU No results found for: CKTOTAL, CKMB, CKMBINDEX, TROPONINI  Lab Results  Component Value Date   CHOL 220 (H) 07/02/2020   CHOL 206 (H) 12/25/2019   CHOL 248 (H) 06/27/2019   Lab Results  Component Value Date   HDL 100 07/02/2020   HDL 84 12/25/2019   HDL 95 06/27/2019   Lab Results  Component Value Date   LDLCALC 109 (H) 07/02/2020   LDLCALC 109 (H) 12/25/2019   LDLCALC 140 (H) 06/27/2019   Lab Results  Component Value Date   TRIG 37 07/02/2020   TRIG 43 12/25/2019   TRIG 43 06/27/2019   Lab Results  Component Value Date   CHOLHDL 2.2 07/02/2020   CHOLHDL 2.5 12/25/2019   CHOLHDL 2.6 06/27/2019   No results found for: LDLDIRECT    Radiology: No results found.  EKG: 07/12/18 SR rate 62 normal    ASSESSMENT AND PLAN:   1. Chest Pain: atypical normal ECG normal myovue 2014and 2021 observe 2. HTN:  Continue daily beta blocker for BP and tremor  F/U TAT  3. ETOH/Abuse:  Sober residual anxiety declined librium from primary  4. HLD:  F/u primary LDL 109   5. Cramps:   Non vascular with normal ABI's 11/15/18  6. Dyspnea:  History of some asthma  EF low normal trivial MR/AR should not be clinically significant suspect it has been seasonal allergies f/u pulmonary and  allergy    F/U cardiology 6 months   Signed: Jenkins Rouge 09/29/2021, 4:10 PM

## 2021-09-30 ENCOUNTER — Ambulatory Visit: Payer: Medicare HMO | Admitting: Allergy & Immunology

## 2021-09-30 ENCOUNTER — Encounter: Payer: Self-pay | Admitting: Allergy & Immunology

## 2021-09-30 VITALS — BP 124/70 | HR 68 | Temp 98.3°F | Resp 16 | Ht 67.0 in | Wt 159.4 lb

## 2021-09-30 DIAGNOSIS — K9041 Non-celiac gluten sensitivity: Secondary | ICD-10-CM | POA: Diagnosis not present

## 2021-09-30 DIAGNOSIS — J4530 Mild persistent asthma, uncomplicated: Secondary | ICD-10-CM

## 2021-09-30 DIAGNOSIS — J310 Chronic rhinitis: Secondary | ICD-10-CM | POA: Diagnosis not present

## 2021-09-30 NOTE — Progress Notes (Signed)
FOLLOW UP  Date of Service/Encounter:  09/30/21   Assessment:   No diagnosis found.  Plan/Recommendations:    There are no Patient Instructions on file for this visit.   Subjective:   Donna [name] is a 71 y.o. female presenting today for follow up of  Chief Complaint  Patient presents with  . Follow-up    Not having any issues right now. Patient would like to have Asthma explained to you.     [name] has a history of the following: Patient Active Problem List   Diagnosis Date Noted  . Gasping for breath 08/19/2021  . PTSD (post-traumatic stress disorder) 08/19/2021  . Parasomnia overlap disorder 08/19/2021  . Snoring 08/19/2021  . Excessive daytime sleepiness 08/19/2021  . Sleep paralysis, recurrent isolated 08/19/2021  . Asthma 02/19/2021  . Multiple drug allergies 02/19/2021  . Gluten intolerance 02/19/2021  . Chronic rhinitis 02/19/2021  . Metatarsalgia of left foot 11/14/2020  . Achilles tendinosis of right lower extremity 11/14/2020  . Essential tremor 07/04/2019  . DOE (dyspnea on exertion) 01/20/2019  . 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 06/13/2018  . 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  . Superficial basal cell carcinoma 06/13/2018  . Upper airway cough syndrome 06/13/2018  . Atrophic vaginitis 02/17/2018  . Bilateral leg cramps 02/17/2018  . Fatty liver, alcoholic 27/10/2374  . Depression 04/19/2016  . Withdrawal complaint 04/19/2016  . Hx of adenomatous colonic polyps 08/03/2015  . Herpes simplex 06/15/2014  . GERD (gastroesophageal reflux disease) 10/14/2013  . Anxiety state, unspecified 10/14/2013  . Recovering alcoholic in remission ([place]) 10/14/2013  . Allergic rhinitis 10/14/2013  . Adjustment disorder with anxiety 10/14/2013  . Elevated low-density lipoprotein level 10/14/2013  . Essential hypertension 10/15/2012  . Heart murmur 05/25/2012    History obtained from: chart  review and {Persons; PED relatives w/patient:19415::"patient"}.  Donna [name] is a 71 y.o. female presenting for {Blank single:19197::"a food challenge","a drug challenge","skin testing","a sick visit","an evaluation of ***","a follow up visit"}.  She was last seen in April 2023.  At that time, Althea Charon started her on Pulmicort because she was not able to tolerate [place] and insurance would not cover Qvar.  She was also having headaches to Flovent.  She was started on the Pulmicort 0.5 mg daily as well as leave albuterol 2 puffs every 4-6 hours as needed.  For her nonallergic rhinitis, she continue with an over-the-counter antihistamine as well as a nasal spray.  Continued avoidance of gluten was recommended for her gluten intolerance. Overall her body does "not respond well to steroids".   Since last visit, she has mostly done well.   Asthma/Respiratory Symptom History: She has a lot of question about her diagnosis. She started coughing in February 2020 after she had bronchitis in December 2019. She went to see a Pulmonologist and she was diagnosed with reflux. She was not convinced.  This was Dr. Melvyn Novas. They did not get along. She then went to see another Pulmonologist in [place]. She was sent in for a methacholine challenge at that point and she did the entire test and then had reactions to the very last couple of doses. This was significant, although she was diagnosed with cough variant asthma. She got the medication filled from last time. She decided that she could not do the nebulizer because it was "too much". She then filled the Flovent and she did this consistently for 3 weeks, but she did have headaches. She  has only used her rescue inhaler 2 times in a while now. She was using it prior to walking and was doing fine with breathing alone. She is going up to a couple of hours without any problems at all. She stopped the Flovent two weeks ago and felt no different with it. She switched medical  doctors after 20+ years.   She last her albuterol three weeks ago when she was at the beach. This is when she was still on Flovent    Her coughing is largely better. She has had a variety of other symptoms as well. She is having hair loss and it was coming out in chunks. She also developed ventricular dysfunction and mitral valve dysfunction. She was on propanolol for essential tremors. She has found that this can "create" asthma symptoms. She has been feeling better since that time. She is now using some CBD oil orally to help with the tremors.   Allergic Rhinitis Symptom History: She previously had negative testing via the blood from Dr. Melvyn Novas. She then went to see Dr. Heber Kimmswick at [place]; she had negative prick and intradermal testing.   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 She was here for a period of 11 years or so at different locations. She eventually went PRN and traveled around.   Otherwise, there have been no changes to her past medical history, surgical history, family history, or social history.    ROS     Objective:   Blood pressure 124/70, pulse 68, temperature 98.3 F (36.8 C), temperature source Temporal, resp. rate 16, height '5\' 7"'$  (1.702 m), weight 159 lb 6.4 oz (72.3 kg), SpO2 97 %. Body mass index is 24.97 kg/m.    Physical Exam   Diagnostic studies:    Spirometry: results abnormal (FEV1: 1.50/61%, FVC: 1.98/62%, FEV1/FVC: 76%).    Spirometry consistent with possible restrictive disease. {Blank single:19197::"Albuterol/Atrovent nebulizer","Xopenex/Atrovent nebulizer","Albuterol nebulizer","Albuterol four puffs via MDI","Xopenex four puffs via MDI"} treatment given in clinic with {Blank single:19197::"significant improvement in FEV1 per ATS criteria","significant improvement in  FVC per ATS criteria","significant improvement in FEV1 and FVC per ATS criteria","improvement in FEV1, but not significant per ATS criteria","improvement in FVC, but not significant per ATS criteria","improvement in FEV1 and FVC, but not significant per ATS criteria","no improvement"}.  Allergy Studies: {Blank single:19197::"none","labs sent instead"," "}    {Blank single:19197::"Allergy testing results were read and interpreted by myself, documented by clinical staff."," "}      Salvatore Marvel, MD  [facility]

## 2021-09-30 NOTE — Patient Instructions (Addendum)
Asthma:  - Lung testing looks slightly lower, but I am not too concerned at all.  - I think you likely had COVID in December 2019 and had long COVID syndrome. - I think that long standing asthma is unlikely given your lack of atopy (allergic tendency) and your lack of asthma as a child (if anything, you might have exercise induced asthma since you react to the cold air).  - Stop the Pulmicort. Daily controller medication(s): NOTHING FOR NOW May use levoalbuterol rescue inhaler 2 puffs every 4 to 6 hours as needed for shortness of breath, chest tightness, coughing, and wheezing. May use levoalbuterol rescue inhaler 2 puffs 5 to 15 minutes prior to strenuous physical activities. Monitor frequency of use.  Asthma control goals:  Full participation in all desired activities (may need albuterol before activity) Albuterol use two times or less a week on average (not counting use with activity) Cough interfering with sleep two times or less a month Oral steroids no more than once a year No hospitalizations   Chronic Rhinitis:(negative skin testing in 2021 at previous allergist) Use over the counter antihistamines such as Zyrtec (cetirizine), Claritin (loratadine), Allegra (fexofenadine), or Xyzal (levocetirizine) daily as needed. May switch antihistamines every few months. May use nasal saline spray (i.e., Simply Saline) as needed.   Gluten intolerance: Continue to avoid gluten. For mild symptoms you can take over the counter antihistamines such as Benadryl and monitor symptoms closely. If symptoms worsen or if you have severe symptoms including breathing issues, throat closure, significant swelling, whole body hives, severe diarrhea and vomiting, lightheadedness then seek immediate medical care.  Return in about 6 months (around 04/02/2022).    Please inform us of any Emergency Department visits, hospitalizations, or changes in symptoms. Call us before going to the ED for breathing or allergy  symptoms since we might be able to fit you in for a sick visit. Feel free to contact us anytime with any questions, problems, or concerns.  It was a pleasure to meet you today!  Websites that have reliable patient information: 1. American Academy of Asthma, Allergy, and Immunology: www.aaaai.org 2. Food Allergy Research and Education (FARE): foodallergy.org 3. Mothers of Asthmatics: http://www.asthmacommunitynetwork.org 4. American College of Allergy, Asthma, and Immunology: www.acaai.org   COVID-19 Vaccine Information can be found at: ShippingScam.co.uk For questions related to vaccine distribution or appointments, please email [email or web address] or call [phone].   We realize that you might be concerned about having an allergic reaction to the COVID19 vaccines. To help with that concern, WE ARE OFFERING THE COVID19 VACCINES IN OUR OFFICE! Ask the front desk for dates!     "Like" [place] on Facebook and Instagram for our latest updates!      A healthy democracy works best when [employer] participate! Make sure you are registered to vote! If you have moved or changed any of your contact information, you will need to get this updated before voting!  In some cases, you MAY be able to register to vote online: [email or web address]

## 2021-10-01 ENCOUNTER — Encounter: Payer: Self-pay | Admitting: Allergy & Immunology

## 2021-10-02 DIAGNOSIS — F4310 Post-traumatic stress disorder, unspecified: Secondary | ICD-10-CM | POA: Diagnosis not present

## 2021-10-02 DIAGNOSIS — R69 Illness, unspecified: Secondary | ICD-10-CM | POA: Diagnosis not present

## 2021-10-07 ENCOUNTER — Other Ambulatory Visit (HOSPITAL_COMMUNITY): Payer: Medicare HMO

## 2021-10-08 ENCOUNTER — Encounter: Payer: Self-pay | Admitting: Cardiovascular Disease

## 2021-10-08 ENCOUNTER — Ambulatory Visit: Payer: Medicare HMO | Admitting: Cardiovascular Disease

## 2021-10-08 ENCOUNTER — Ambulatory Visit (HOSPITAL_COMMUNITY): Payer: Medicare HMO | Attending: Cardiology

## 2021-10-08 VITALS — BP 140/84 | HR 50 | Ht 67.0 in | Wt 159.8 lb

## 2021-10-08 DIAGNOSIS — I340 Nonrheumatic mitral (valve) insufficiency: Secondary | ICD-10-CM | POA: Diagnosis not present

## 2021-10-08 DIAGNOSIS — E785 Hyperlipidemia, unspecified: Secondary | ICD-10-CM

## 2021-10-08 DIAGNOSIS — I10 Essential (primary) hypertension: Secondary | ICD-10-CM | POA: Diagnosis not present

## 2021-10-08 DIAGNOSIS — R0602 Shortness of breath: Secondary | ICD-10-CM | POA: Diagnosis not present

## 2021-10-08 LAB — ECHOCARDIOGRAM COMPLETE
Area-P 1/2: 3.02 cm2
P 1/2 time: 594 msec
S' Lateral: 2.7 cm

## 2021-10-08 NOTE — Patient Instructions (Signed)

## 2021-10-09 ENCOUNTER — Encounter: Payer: Self-pay | Admitting: Neurology

## 2021-10-09 ENCOUNTER — Telehealth: Payer: Self-pay | Admitting: Neurology

## 2021-10-09 ENCOUNTER — Telehealth: Payer: Self-pay | Admitting: Cardiovascular Disease

## 2021-10-09 ENCOUNTER — Encounter: Payer: Self-pay | Admitting: Cardiovascular Disease

## 2021-10-09 DIAGNOSIS — R69 Illness, unspecified: Secondary | ICD-10-CM | POA: Diagnosis not present

## 2021-10-09 DIAGNOSIS — F4310 Post-traumatic stress disorder, unspecified: Secondary | ICD-10-CM | POA: Diagnosis not present

## 2021-10-09 NOTE — Telephone Encounter (Signed)
Patient returned call for her echo results and EKG.   She has questions about both of them.

## 2021-10-09 NOTE — Telephone Encounter (Signed)
Left message for patient to call back  

## 2021-10-09 NOTE — Telephone Encounter (Signed)
aetna medicare pending uploaded notes on the portal  

## 2021-10-09 NOTE — Telephone Encounter (Signed)
[employer]- Aetna medicare Josem Kaufmann: [id number] (exp. 10/09/21 to 04/07/22). Patient is scheduled at [facility] For 10/14/21 at 8 pm.

## 2021-10-09 NOTE — Telephone Encounter (Signed)
Patient received results through mychart with Dr. Kyla Balzarine advisement.

## 2021-10-13 ENCOUNTER — Ambulatory Visit: Payer: Medicare HMO | Attending: Sports Medicine | Admitting: Physical Therapy

## 2021-10-13 ENCOUNTER — Encounter: Payer: Self-pay | Admitting: Physical Therapy

## 2021-10-13 DIAGNOSIS — R262 Difficulty in walking, not elsewhere classified: Secondary | ICD-10-CM | POA: Diagnosis not present

## 2021-10-13 DIAGNOSIS — M79672 Pain in left foot: Secondary | ICD-10-CM | POA: Diagnosis not present

## 2021-10-13 NOTE — Therapy (Signed)
[place] [place], [place], [address] Phone: [phone]   Fax:  [phone]  Physical Therapy Treatment  Patient Details  Name: Donna [name] MRN: [id number] Date of Birth: [birth year] Referring Provider (PT): Renata Caprice   Encounter Date: 10/13/2021   PT End of Session - 10/13/21 0754     Visit Number 3    Date for PT Re-Evaluation 12/03/21    Authorization Type Aetna does not cover ionto    PT Start Time 0752    PT Stop Time 0840    PT Time Calculation (min) 48 min    Activity Tolerance Patient tolerated treatment well    Behavior During Therapy [facility] for tasks assessed/performed             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   Subjective Assessment - 10/13/21 0755     Subjective Patient went to the beach, she reports that her foot tightened up on her.  She reports that some of the positions in yoga increase her pain.  She can walk without pain with shoes on.  The PF is much tighter    Currently in Pain? No/denies    Aggravating Factors  walking at end of day                               [facility] Adult PT Treatment/Exercise - 10/13/21 0001       Manual Therapy   Manual Therapy Soft  tissue mobilization;Passive ROM;Joint mobilization;Taping    Joint Mobilization metatarsal mobs    Soft tissue mobilization to the plantar surface and the dorsum b/n toes    Passive ROM great toes and ankle    McConnell Leukotape two strips plantar surface of the foot and then one coming across and up medially for more support      Ankle Exercises: Stretches   Gastroc Stretch 3 reps;20 seconds      Ankle Exercises: Seated   Towel Crunch 5 reps    [place]ble Pickup 12x                       PT Short Term Goals - 09/08/21 0834       PT SHORT TERM GOAL #1   Title independent with initial HEP    Status Partially Met               PT Long Term Goals - 10/13/21 0758       PT  LONG TERM GOAL #1   Title understand proper posture and body mechnaics/self care    Status Achieved      PT LONG TERM GOAL #2   Title increase left ankle ROM to WNL's    Status On-going      PT LONG TERM GOAL #3   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  I added some plantar strength and some tape today to see if this helps    PT Next Visit Plan see if tape helps, she is to call MD and see about other options    Consulted and Agree with Plan of Care Patient             Patient will benefit from skilled therapeutic intervention in order to improve the following deficits and impairments:  Abnormal gait, Difficulty walking, Decreased range of motion, Decreased endurance, Pain, Decreased balance, Impaired flexibility, Decreased strength, Decreased mobility  Visit Diagnosis: Pain in left foot  Difficulty in walking, not elsewhere classified     Problem  List Patient Active Problem List   Diagnosis Date Noted   Gasping for breath 08/19/2021   PTSD (post-traumatic stress disorder) 08/19/2021   Parasomnia overlap disorder 08/19/2021   Snoring 08/19/2021   Excessive daytime sleepiness 08/19/2021   Sleep paralysis, recurrent isolated 08/19/2021   Asthma 02/19/2021   Multiple drug allergies 02/19/2021   Gluten intolerance 02/19/2021   Chronic rhinitis 02/19/2021   Metatarsalgia of left foot 11/14/2020   Achilles tendinosis of right lower extremity 11/14/2020   Essential tremor 07/04/2019   DOE (dyspnea on exertion) 01/20/2019   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 06/13/2018   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   Superficial basal cell carcinoma 06/13/2018   Upper airway cough syndrome 06/13/2018   Atrophic vaginitis 02/17/2018   Bilateral leg cramps 02/17/2018   Fatty liver, alcoholic 11/55/2080   Depression 04/19/2016   Withdrawal complaint 04/19/2016   Hx of adenomatous colonic polyps 08/03/2015   Herpes simplex 06/15/2014   GERD (gastroesophageal reflux disease) 10/14/2013   Anxiety state, unspecified 10/14/2013   Recovering alcoholic in remission ([place]) 10/14/2013   Allergic rhinitis 10/14/2013   Adjustment disorder with anxiety 10/14/2013   Elevated low-density lipoprotein level 10/14/2013   Essential hypertension 10/15/2012   Heart murmur 05/25/2012    Sumner Boast, PT 10/13/2021, 8:30 AM  [place]. [place], [place], [address] Phone: [phone]   Fax:  [phone]  Name: ANYELIN MOGLE MRN: [id number] Date of Birth: 10-23-1950

## 2021-10-14 ENCOUNTER — Ambulatory Visit (INDEPENDENT_AMBULATORY_CARE_PROVIDER_SITE_OTHER): Payer: Medicare HMO | Admitting: Neurology

## 2021-10-14 DIAGNOSIS — G4719 Other hypersomnia: Secondary | ICD-10-CM

## 2021-10-14 DIAGNOSIS — R0689 Other abnormalities of breathing: Secondary | ICD-10-CM

## 2021-10-14 DIAGNOSIS — G4759 Other parasomnia: Secondary | ICD-10-CM

## 2021-10-14 DIAGNOSIS — G4753 Recurrent isolated sleep paralysis: Secondary | ICD-10-CM

## 2021-10-14 DIAGNOSIS — R0683 Snoring: Secondary | ICD-10-CM

## 2021-10-14 DIAGNOSIS — F4310 Post-traumatic stress disorder, unspecified: Secondary | ICD-10-CM

## 2021-10-16 ENCOUNTER — Ambulatory Visit: Payer: Medicare HMO | Admitting: Physical Therapy

## 2021-10-20 ENCOUNTER — Ambulatory Visit: Payer: Medicare HMO

## 2021-10-20 ENCOUNTER — Telehealth: Payer: Self-pay | Admitting: Allergy & Immunology

## 2021-10-20 NOTE — Telephone Encounter (Signed)
Donna [name] came in to return her nebulizer.  She states she doesn't ever plan to use it.  Donna [name] states she never used it and only opened it to take pictures to send to the [employer].  [facility] sent her an email telling her she can return it in the [place] office.  There was no documentation in her chart.  So I accepted the nebulizer and had Morrissa email a copy of the original email to our email address.

## 2021-10-22 ENCOUNTER — Encounter: Payer: Self-pay | Admitting: Sports Medicine

## 2021-10-23 DIAGNOSIS — R69 Illness, unspecified: Secondary | ICD-10-CM | POA: Diagnosis not present

## 2021-10-23 DIAGNOSIS — F4310 Post-traumatic stress disorder, unspecified: Secondary | ICD-10-CM | POA: Diagnosis not present

## 2021-10-28 DIAGNOSIS — M722 Plantar fascial fibromatosis: Secondary | ICD-10-CM | POA: Diagnosis not present
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
## 2021-10-28 NOTE — Progress Notes (Signed)
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  I like for this patient to have a MSLT to follow , after weaning off Buspar.   (MSLT was ordered on 08-19-2021)

## 2021-10-29 ENCOUNTER — Other Ambulatory Visit: Payer: Self-pay | Admitting: Neurology

## 2021-10-29 ENCOUNTER — Telehealth: Payer: Self-pay | Admitting: Neurology

## 2021-10-29 NOTE — Telephone Encounter (Signed)
Pt returned call and I was able to review the sleep study results with her. Advised that Dr Brett Fairy would recommend still pursing the psg/mslt study. Advised the pt she would have to be off buspar. Pt states that she has been off of that since prior to this last study which was completed on 6/6/ she has no intention of restarting it. Pt was informed we are working on new equipment for our sleep lab to perform these daytime test. Advised that we will contact her once we are able to schedule. The pt indicates that she has a lot of travel plans coming up and she is unsure how it will work with her schedule and I advised that the sleep lab will call once we get insurance authorization. Informed her she can discuss with them what will work best for her. Pt verbalized understanding and was appreciative for the information.

## 2021-10-29 NOTE — Telephone Encounter (Signed)
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  I like for this patient to have a MSLT to follow , after weaning off Buspar.   (MSLT was ordered on 08-19-2021)

## 2021-10-29 NOTE — Telephone Encounter (Signed)
Called patient to discuss sleep study results. No answer at this time. LVM for the patient to call back.  Will send a mychart message to the pt.

## 2021-10-30 DIAGNOSIS — F4310 Post-traumatic stress disorder, unspecified: Secondary | ICD-10-CM | POA: Diagnosis not present

## 2021-10-30 DIAGNOSIS — R69 Illness, unspecified: Secondary | ICD-10-CM | POA: Diagnosis not present

## 2021-10-30 NOTE — Telephone Encounter (Signed)
Received the e-mail, I called neb doctor however they were already closed. Will call them back tomorrow 10/31/2021.

## 2021-11-06 NOTE — Telephone Encounter (Signed)
Spoke with [employer] and everything has been placed on hold and patient has a zero balance. Neb Doctor stated that as long as the product hasn't been used or open we can assign it out to another patient. Nebulizer has been placed back in the spiro room.

## 2021-11-12 DIAGNOSIS — L821 Other seborrheic keratosis: Secondary | ICD-10-CM | POA: Diagnosis not present

## 2021-11-12 DIAGNOSIS — L578 Other skin changes due to chronic exposure to nonionizing radiation: Secondary | ICD-10-CM | POA: Diagnosis not present

## 2021-11-12 DIAGNOSIS — L570 Actinic keratosis: Secondary | ICD-10-CM | POA: Diagnosis not present

## 2021-11-12 DIAGNOSIS — Z85828 Personal history of other malignant neoplasm of skin: Secondary | ICD-10-CM | POA: Diagnosis not present

## 2021-12-09 ENCOUNTER — Telehealth: Payer: Self-pay

## 2021-12-09 NOTE — Telephone Encounter (Signed)
spoke to the patient she wants to hold off at this time on getting this sched-wcb when she is ready to schedule.

## 2021-12-09 NOTE — Telephone Encounter (Signed)
LVM for pt to call back to let sleep lab know if she would like to proceed with sleep studies.

## 2021-12-30 DIAGNOSIS — R69 Illness, unspecified: Secondary | ICD-10-CM | POA: Diagnosis not present

## 2021-12-30 DIAGNOSIS — F4310 Post-traumatic stress disorder, unspecified: Secondary | ICD-10-CM | POA: Diagnosis not present

## 2022-01-06 DIAGNOSIS — Z1231 Encounter for screening mammogram for malignant neoplasm of breast: Secondary | ICD-10-CM | POA: Diagnosis not present

## 2022-01-20 DIAGNOSIS — G43909 Migraine, unspecified, not intractable, without status migrainosus: Secondary | ICD-10-CM | POA: Diagnosis not present

## 2022-01-20 DIAGNOSIS — H43813 Vitreous degeneration, bilateral: Secondary | ICD-10-CM | POA: Diagnosis not present

## 2022-01-20 DIAGNOSIS — H52203 Unspecified astigmatism, bilateral: Secondary | ICD-10-CM | POA: Diagnosis not present

## 2022-01-20 DIAGNOSIS — H5310 Unspecified subjective visual disturbances: Secondary | ICD-10-CM | POA: Diagnosis not present

## 2022-01-28 ENCOUNTER — Ambulatory Visit: Payer: Medicare HMO | Admitting: Physician Assistant

## 2022-02-17 DIAGNOSIS — L821 Other seborrheic keratosis: Secondary | ICD-10-CM | POA: Diagnosis not present

## 2022-02-17 DIAGNOSIS — L820 Inflamed seborrheic keratosis: Secondary | ICD-10-CM | POA: Diagnosis not present

## 2022-02-17 DIAGNOSIS — L578 Other skin changes due to chronic exposure to nonionizing radiation: Secondary | ICD-10-CM | POA: Diagnosis not present

## 2022-02-17 DIAGNOSIS — L570 Actinic keratosis: Secondary | ICD-10-CM | POA: Diagnosis not present

## 2022-03-12 NOTE — Progress Notes (Signed)
NEUROLOGY FOLLOW UP OFFICE NOTE  TAMETRIA AHO [id number]  Assessment/Plan:   Essential tremor - It appears stable.  Propranolol does not overwhelmingly have a high probability to cause asthma exacerbation or have adverse reaction to albuterol.  She hasn't had a reaction thus far and so I think it is okay to keep taking the propranolol Memory deficits - at this time, I believe that her subjective memory problems are related to her underlying anxiety and not sign of early cognitive impairment.   Elevated blood pressure - she has HTN but this is related to anxiety  Continue propranolol as needed Monitor for any changes in memory.  If there is a concern it is getting worse, then she is encouraged to follow up.   Subjective:  CHAQUANA NICHOLS is a 71 year old female with IBS, HLD, ocular migraines and possible narcolepsy who presents for essential tremor.    She has essential tremor.  She has been on propranolol since 2017.  At first, she took it three times daily.  A couple of years ago, she started taking it as needed and that seems to work for her.  Usually the tremor gets worse if she is nervous.  She does have anxiety and PTSD.  She was diagnosed with asthma and was told to be careful with propranolol as it may interact with albuterol.    She also is concerned about her memory and attention.  She reports difficulty with recall and word-finding.  If she is reading, she has to keep looking up words and their meaning.  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  Some difficulty remembering names of acquaintances.  She still can remember birthdays and old phone numbers.  She has a lot of family stressors.  Her younger brother has stage 3 colon cancer.  Also reports a lot of fatigue.  Both parents with history of dementia.  Father diagnosed in his mid-39s and passed away at 47.  Mother still alive (3 years old), fast onset 2 years ago.    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     b complex vitamins tablet Take 1 tablet by mouth daily.       budesonide (PULMICORT) 0.5 MG/2ML nebulizer solution Take 2 mLs (0.5 mg total) by nebulization daily. (Patient not taking: Reported on 09/30/2021) 60 mL 2   calcium gluconate 500 MG tablet Take 500 mg by mouth daily.     estradiol (ESTRACE) 0.1 MG/GM vaginal cream SMARTSIG:1 Applicator Vaginal Once a Week     imiquimod (ALDARA) 5 % cream APPLY TO AFFECTED  AREA ON THE LEG M-W-F FOR 8 WKS (Patient not taking: Reported on 10/08/2021) 6 each 0   lisinopril (ZESTRIL) 5 MG tablet TAKE 1 TABLET BY MOUTH DAILY FOR ELEVATED BP WHILE TRAVELING (Patient taking differently: as needed. TAKE 1 TABLET BY MOUTH DAILY FOR ELEVATED BP WHILE TRAVELING) 90 tablet 1   Loratadine (CLARITIN PO) Take by mouth.     Magnesium 500 MG TABS Take 1 tablet by mouth daily.       Multiple Vitamin (MULTIVITAMIN) capsule Take 1 capsule by mouth daily.       propranolol (INDERAL) 10 MG tablet Take 10 mg by mouth as needed.     Spacer/Aero-Holding Chambers (AEROCHAMBER PLUS) inhaler Use as instructed 1 each 2   valACYclovir (VALTREX) 500 MG tablet ONE BY MOUTH TWICE A DAY X 5 DAYS FOR HERPES OUTBREAK (Patient taking differently: as  needed. ONE BY MOUTH TWICE A DAY X 5 DAYS FOR HERPES OUTBREAK) 10 tablet prn   Current Facility-Administered Medications on File Prior to Visit  Medication Dose Route Frequency Provider Last Rate Last Admin   0.9 %  sodium chloride infusion  500 mL Intravenous Continuous Ladene Artist, MD        ALLERGIES: Allergies  Allergen Reactions   Ciprofloxacin     Neuropathy    Doxycycline Nausea Only   Gluten Meal     GI upset    Penicillins Nausea And Vomiting    "breaking out"   Quinolones Other (See Comments)    REACTION: sick / neuropathy     FAMILY HISTORY: Family History  Problem Relation Age of Onset   Dementia Mother    Diabetes Mother    Melanoma Father    Colon cancer Father 63       Survived   Heart disease Father    Heart attack Father    Rheum arthritis Father    Dementia Father    Healthy Sister    Healthy Brother    Dementia Maternal Aunt    Dementia Maternal Uncle    Colon polyps Paternal Aunt    Pancreatic cancer Paternal Aunt    Rectal cancer Paternal Aunt    Colon cancer Paternal Aunt    Colon cancer Paternal Grandmother    Breast cancer Maternal Grandmother    Stomach cancer Neg Hx    Esophageal cancer Neg Hx    Liver cancer Neg Hx       Objective:  Blood pressure (!) 167/89, pulse 64, height '5\' 7"'$  (1.702 m), weight 159 lb 3.2 oz (72.2 kg), SpO2 98 %. General: No acute distress.  Patient appears well-groomed.   Head:  Normocephalic/atraumatic Eyes:  Fundi examined but not visualized Neck: supple, no paraspinal tenderness, full range of motion Heart:  Regular rate and rhythm Neurological Exam: alert and oriented to person, place, and time.  Speech fluent and not dysarthric, language intact.  CN II-XII intact. Bulk and tone normal, muscle strength 5/5 throughout.  No tremor.  Sensation to light touch intact.  Deep tendon reflexes 2+ throughout, toes downgoing.  Finger to nose testing intact.  Gait normal, Romberg negative.   Metta Clines,  DO  CC: [name], DO

## 2022-03-16 ENCOUNTER — Encounter: Payer: Self-pay | Admitting: Neurology

## 2022-03-16 ENCOUNTER — Ambulatory Visit: Payer: Medicare HMO | Admitting: Neurology

## 2022-03-16 VITALS — BP 167/89 | HR 64 | Ht 67.0 in | Wt 159.2 lb

## 2022-03-16 DIAGNOSIS — R413 Other amnesia: Secondary | ICD-10-CM

## 2022-03-16 DIAGNOSIS — F4310 Post-traumatic stress disorder, unspecified: Secondary | ICD-10-CM | POA: Diagnosis not present

## 2022-03-16 DIAGNOSIS — G250 Essential tremor: Secondary | ICD-10-CM | POA: Diagnosis not present

## 2022-03-16 DIAGNOSIS — R030 Elevated blood-pressure reading, without diagnosis of hypertension: Secondary | ICD-10-CM | POA: Diagnosis not present

## 2022-03-16 DIAGNOSIS — R69 Illness, unspecified: Secondary | ICD-10-CM | POA: Diagnosis not present

## 2022-03-17 ENCOUNTER — Other Ambulatory Visit (HOSPITAL_COMMUNITY): Payer: Self-pay | Admitting: Family Medicine

## 2022-03-17 DIAGNOSIS — Z136 Encounter for screening for cardiovascular disorders: Secondary | ICD-10-CM

## 2022-03-26 ENCOUNTER — Ambulatory Visit: Payer: Medicare HMO | Admitting: Sports Medicine

## 2022-03-26 VITALS — BP 126/68 | Ht 67.0 in | Wt 160.0 lb

## 2022-03-26 DIAGNOSIS — M25552 Pain in left hip: Secondary | ICD-10-CM

## 2022-03-26 NOTE — Progress Notes (Signed)
  Donna [name] - 71 y.o. female MRN [id number]  Date of birth: [birth year]    CHIEF COMPLAINT:   Left hip pain    SUBJECTIVE:   HPI:  Pleasant 70 year old female here for evaluation of left hip pain.  She has had anterior lateral hip pain off and on for several months now.  She describes a sharp shooting pain in the front and on the sides that is worse with activities like getting in and out of her car.  She also notices some weakness in the left lower extremity.  She has tried taking over-the-counter anti-inflammatories like Motrin but has not gotten any relief for this.  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  She has been practicing her hip stabilization but has not noticed any improvement with this on her own.  She denies any radiation of the pain.  Denies numbness or tingling down the leg.  ROS:     See HPI  PERTINENT  PMH / PSH FH / / SH:  Past Medical, Surgical, Social, and Family History Reviewed & Updated in the EMR.  Pertinent findings include:  Osteopenia  OBJECTIVE: BP 126/68   Ht '5\' 7"'$  (1.702 m)   Wt 160 lb (72.6 kg)   BMI 25.06 kg/m   Physical Exam:  Vital signs are reviewed.  GEN: Alert and oriented, NAD Pulm: Breathing unlabored PSY: normal mood, congruent affect  MSK: Left hip -no obvious deformity.  She is tender to palpation over the flexor group anteriorly, the IT band distally, and the glue made ligament insertions on the posterior aspect of the greater trochanter.  She is nontender to palpation over the iliac crest, anterior aspect of the greater trochanter.  She has full range of motion in the hip.  4/5 strength with resisted flexion.  5/5 strength with resisted abduction.  Negative logroll test.  Negative straight leg raise and Stinchfield test.  Negative FADIR and FABER.  Positive Trendelenburg on the left.  Neurovascularly intact distally.  ASSESSMENT & PLAN:  1. L Hip Pain -This is a self limited  problem.  I do not think her hip pain is intra-articular based on her exam today.  I do think she has some weakness in the hip flexors and glutes, and a rather tight IT band.  She would like to treat this conservatively with some physical therapy.  She can follow-up with [place] in 4 to 6 weeks.  If no improvement, we could order x-rays to investigate the integrity of her joint space, although I think arthritis is less likely.  All questions were answered she agrees to plan.  Dortha Kern, MD PGY-4, Sports Medicine Fellow [place]  Patient seen and evaluated with the sports medicine fellow.  I agree with the above plan of care.  Home exercises to strengthen the left hip with a subsequent follow-up visit with [place] in 6 weeks.

## 2022-03-27 ENCOUNTER — Ambulatory Visit (HOSPITAL_BASED_OUTPATIENT_CLINIC_OR_DEPARTMENT_OTHER): Payer: Medicare HMO | Admitting: Pulmonary Disease

## 2022-03-27 ENCOUNTER — Encounter (HOSPITAL_BASED_OUTPATIENT_CLINIC_OR_DEPARTMENT_OTHER): Payer: Self-pay | Admitting: Pulmonary Disease

## 2022-03-27 VITALS — BP 114/60 | HR 58 | Ht 67.0 in | Wt 160.2 lb

## 2022-03-27 DIAGNOSIS — J4530 Mild persistent asthma, uncomplicated: Secondary | ICD-10-CM

## 2022-03-27 MED ORDER — INCRUSE ELLIPTA 62.5 MCG/ACT IN AEPB: 1.0000 | INHALATION_SPRAY | Freq: Every day | RESPIRATORY_TRACT | 2 refills | Status: DC

## 2022-03-27 NOTE — Patient Instructions (Addendum)
  Asthma   --Unable to tolerate inhaled corticosteroids --START Incruse ONE puff ONCE a day --CONTINUE Albuterol AS NEEDED for shortness of breath or wheezing --Encourage regular aerobic exercise daily  Follow-up with me in January

## 2022-03-27 NOTE — Progress Notes (Signed)
Subjective:   PATIENT ID: Donna [name] GENDER: female DOB: Sep 22, 1950, MRN: [id number]  Chief Complaint  Patient presents with   Consult    Asthma doe    Reason for Visit: New consult for asthma  Ms. Donna [name] is a 71 year old female former smoker with adult onset asthma, HTN, HLD, essential tremor, IBS, HLD who present for evaluation for asthma.  She was diagnosed with asthma in 2020/2021 with methacholine test in [place]. Previously on Flovent and Alvesco but unable to tolerate due to nausea. She reports unable to take prednisone due severe irritability. She was last seen by Allergy Asthma for initial asthma evaluation in May 2023. On albuterol as needed for stable symptoms. Has previously tried ICS inhaler with no improvement.  Today she reports shortness of breath with walking. Also having non-productive cough. Denies wheezing. Uses albuterol prior to exercise. Cold air, cold food triggers and anxiety as well. Chemicals, strong odors will trigger. No recent exaerbations requiring treatment with antibiotics  Has been evaluated by GI for reflux and negative. Previously had allergy testing which is negative.   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   I have personally reviewed patient's past medical/family/social history, allergies, current medications.  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   chest - CX3 + 5FU   Squamous cell carcinoma of skin 09/27/2014   left thigh - tx p bx   Substance abuse ([place])      Family History  Problem Relation Age of Onset    Dementia Mother    Diabetes Mother    Melanoma Father    Colon cancer Father 16       Survived   Heart disease Father    Heart attack Father    Rheum arthritis Father    Dementia Father    Healthy Sister    Healthy Brother    Dementia Maternal Aunt    Dementia Maternal Uncle    Colon polyps Paternal Aunt    Pancreatic cancer Paternal Aunt    Rectal cancer Paternal Aunt    Colon cancer Paternal Aunt    Colon cancer Paternal Grandmother    Breast cancer Maternal Grandmother    Stomach cancer Neg Hx    Esophageal cancer Neg Hx    Liver cancer Neg Hx      Social History   Occupational History   Occupation: [employer]  Tobacco Use   Smoking status: Former    Years: 30.00    Types: Cigarettes    Quit date: 10/15/1996    Years since quitting: 25.4   Smokeless tobacco: Never  Vaping Use   Vaping Use: Never used  Substance and Sexual Activity   Alcohol use: No   Drug use: No   Sexual activity: Yes    Allergies  Allergen Reactions   Ciprofloxacin     Neuropathy    Doxycycline Nausea Only   Gluten Meal     GI upset    Penicillins  Nausea And Vomiting    "breaking out"   Quinolones Other (See Comments)    REACTION: sick / neuropathy      Outpatient Medications Prior to Visit  Medication Sig Dispense Refill   albuterol (VENTOLIN HFA) 108 (90 Base) MCG/ACT inhaler Inhale 2 puffs into the lungs every 4 (four) hours as needed.     b complex vitamins tablet Take 1 tablet by mouth daily.       calcium gluconate 500 MG tablet Take 500 mg by mouth daily.     estradiol (ESTRACE) 0.1 MG/GM vaginal cream SMARTSIG:1 Applicator Vaginal Once a Week     lisinopril (ZESTRIL) 5 MG tablet TAKE 1 TABLET BY MOUTH DAILY FOR ELEVATED BP WHILE TRAVELING (Patient taking differently: as needed. TAKE 1 TABLET BY MOUTH DAILY FOR ELEVATED BP WHILE TRAVELING) 90 tablet 1   Loratadine (CLARITIN PO) Take by mouth.     Magnesium 500 MG TABS Take 1 tablet by mouth daily.       Multiple Vitamin  (MULTIVITAMIN) capsule Take 1 capsule by mouth daily.       propranolol (INDERAL) 10 MG tablet Take 10 mg by mouth as needed.     valACYclovir (VALTREX) 500 MG tablet ONE BY MOUTH TWICE A DAY X 5 DAYS FOR HERPES OUTBREAK (Patient taking differently: as needed. ONE BY MOUTH TWICE A DAY X 5 DAYS FOR HERPES OUTBREAK) 10 tablet prn   Facility-Administered Medications Prior to Visit  Medication Dose Route Frequency Provider Last Rate Last Admin   0.9 %  sodium chloride infusion  500 mL Intravenous Continuous Ladene Artist, MD        Review of Systems  Constitutional:  Negative for chills, diaphoresis, fever, malaise/fatigue and weight loss.  HENT:  Negative for congestion.   Respiratory:  Positive for shortness of breath. Negative for cough, hemoptysis, sputum production and wheezing.   Cardiovascular:  Negative for chest pain, palpitations and leg swelling.     Objective:   Vitals:   03/27/22 1049  BP: 114/60  Pulse: (!) 58  SpO2: 97%  Weight: 160 lb 2.6 oz (72.7 kg)  Height: '5\' 7"'$  (1.702 m)   SpO2: 97 % O2 Device: None (Room air)  Physical Exam: General: Well-appearing, no acute distress HENT: [place], AT Eyes: EOMI, no scleral icterus Respiratory: Clear to auscultation bilaterally.  No crackles, wheezing or rales Cardiovascular: RRR, -M/R/G, no JVD Extremities:-Edema,-tenderness Neuro: AAO x4, CNII-XII grossly intact Psych: Normal mood, normal affect  Data Reviewed:  Imaging: CT Chest 06/16/21 ([facility] report only) - 2 mm LL nodule, decreased compared to prior PFT: Spirometry 08/25/21 FVC 2.25 (71%) FEV1 1.42 (58%) Ratio 0.63  Spirometry 10/03/21 FVC 1.98 (62%) FEV1 1.5 (61%) Ratio 0.76  Labs: CBC    Component Value Date/Time   WBC 3.6 (L) 02/02/2021 0545   RBC 3.92 02/02/2021 0545   HGB 11.7 (L) 02/02/2021 0545   HCT 34.5 (L) 02/02/2021 0545   PLT 230 02/02/2021 0545   MCV 88.0 02/02/2021 0545   MCH 29.8 02/02/2021 0545   MCHC 33.9 02/02/2021 0545   RDW 12.6  02/02/2021 0545   LYMPHSABS 882 07/02/2020 0924   MONOABS 0.5 01/18/2019 1228   EOSABS 61 07/02/2020 0924   BASOSABS 30 07/02/2020 0924   Abs eos 07/02/20 - 61     Assessment & Plan:   Discussion: HPI  Asthma   --Unable to tolerate inhaled corticosteroids --START Incruse ONE puff ONCE a day --CONTINUE Albuterol AS NEEDED for shortness of breath or wheezing --  Encourage regular aerobic exercise daily  Health Maintenance Immunization History  Administered Date(s) Administered   Influenza,trivalent, recombinat, inj, PF 02/16/2014   Influenza-Unspecified 02/22/2014, 02/05/2015, 02/05/2015   PFIZER Comirnaty(Gray Top)Covid-19 Tri-Sucrose Vaccine 06/11/2020   PFIZER(Purple Top)SARS-COV-2 Vaccination 07/09/2019, 08/02/2019, 12/28/2019   Tdap 05/26/2007, 07/12/2018   CT Lung Screen- not qualified. Quit smoking >15 years  No orders of the defined types were placed in this encounter.  Meds ordered this encounter  Medications   umeclidinium bromide (INCRUSE ELLIPTA) 62.5 MCG/ACT AEPB    Sig: Inhale 1 puff into the lungs daily.    Dispense:  30 each    Refill:  2    No follow-ups on file. January  I have spent a total time of 45-minutes on the day of the appointment reviewing prior documentation, coordinating care and discussing medical diagnosis and plan with the patient/family. Imaging, labs and tests included in this note have been reviewed and interpreted independently by me.  [place], MD [facility] 03/27/2022 11:33 AM  Office Number [phone]

## 2022-04-06 ENCOUNTER — Institutional Professional Consult (permissible substitution) (HOSPITAL_BASED_OUTPATIENT_CLINIC_OR_DEPARTMENT_OTHER): Payer: Medicare HMO | Admitting: Pulmonary Disease

## 2022-04-06 NOTE — Progress Notes (Signed)
CARDIOLOGY CONSULT NOTE       Patient ID: Donna [name] MRN: [id number] DOB/AGE: 09-16-1950 71 y.o.  Referring Physician: Renold Genta Primary Physician: [name], DO Primary Cardiologist: Johnsie Cancel Reason for Consultation: Leg cramps, Dyspnea. Chest pain    HPI:  71 y.o. history of HTN and HLD. I saw her in 2014 for atypical chest pain She had normal ECG and normal nuclear study no infarct or ischemia EF 57% Previous ETOH abuse Rx [facility] At that time Sober since 2017 Rx inderal for withdrawal Was prescribed lisinopril 5 mg for BP but did not take. Has had essential tremor and saw Dr TAT 08/22/18 to r/o Parkinsons There is a family history of tremor Affects left arm mostly She declined Rx other than PRN inderal BP noted to be elevated but she indicated only taking inderal and lisinopril as needed Re established heart care 10/08/21   ABI"s done 11/15/18 were normal with no signs of PVD   2021- She sees Dr Renold Genta for primary care. Only taking crestor once/week Having more exertional dyspnea Is a yoga instructor Currently married for 3rd time   Myovue 08/31/19 normal EF 59% Echo 09/11/20 EF 55-60% mild MR mild AR tri leaflet valve aortic root 4.0 cm TTE 10/08/21 EF 60-65% normal RV trivial MR trivial AR Aorta 4.0   Getting physical Rx for left foot pain MRI 06/07/21 with arthritis and some muscle strain in abductor hallucis muscle   C spine surgery March 2023 and sees Dohmeier for sleep issues   She has had some asthma and allergies this pollen season and has been seeing pulmonary  I see her husband [name] as well   ROS All other systems reviewed and negative except as noted above  Past Medical History:  Diagnosis Date   Anxiety    Asthma    Basal cell carcinoma 06/29/2017   right lip - CX3 + 5FU   Cataract    bilateral lens implants   Cough variant asthma    Depression    GERD (gastroesophageal reflux disease)    Heart murmur    Hiatal hernia    Hyperlipidemia    IBS  (irritable bowel syndrome)    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   chest - CX3 + 5FU   Squamous cell carcinoma of skin 09/27/2014   left thigh - tx p bx   Substance abuse ([place])     Family History  Problem Relation Age of Onset   Dementia Mother    Diabetes Mother    Melanoma Father    Colon cancer Father 40       Survived   Heart disease Father    Heart attack Father    Rheum arthritis Father    Dementia Father    Healthy Sister    Healthy Brother    Dementia Maternal Aunt    Dementia Maternal Uncle    Colon polyps Paternal Aunt    Pancreatic cancer Paternal Aunt    Rectal cancer Paternal Aunt    Colon cancer Paternal Aunt    Colon cancer Paternal Grandmother    Breast cancer Maternal Grandmother    Stomach cancer Neg Hx    Esophageal cancer Neg Hx    Liver cancer Neg Hx     Social History   Socioeconomic History   Marital status: Married    Spouse name: [name]   Number of children: 0   Years of education: Not on file   Highest education level:  Associate degree: academic program  Occupational History   Occupation: [employer]  Tobacco Use   Smoking status: Former    Years: 30.00    Types: Cigarettes    Quit date: 10/15/1996    Years since quitting: 25.5   Smokeless tobacco: Never  Vaping Use   Vaping Use: Never used  Substance and Sexual Activity   Alcohol use: No   Drug use: No   Sexual activity: Yes  Other Topics Concern   Not on file  Social History Narrative   Lives with husband   Right handed   1-2 caffeine drinks daily   Social Determinants of Health   Financial Resource Strain: Not on file  Food Insecurity: Not on file  Transportation Needs: Not on file  Physical Activity: Not on file  Stress: Not on file  Social Connections: Not on file  Intimate Partner Violence: Not on file    Past Surgical History:  Procedure Laterality Date   ANKLE SURGERY     Bilateral    CATARACT EXTRACTION Bilateral    CERVICAL SPINE  SURGERY  07/21/2021   TUBAL LIGATION        Physical Exam: Blood pressure 130/80, pulse 68, height '5\' 7"'$  (1.702 m), weight 163 lb 3.2 oz (74 kg), SpO2 97 %.    Affect appropriate Healthy:  appears stated age 71: normal Neck supple with no adenopathy JVP normal no bruits no thyromegaly Lungs clear with no wheezing and good diaphragmatic motion Heart:  S1/S2 no murmur, no rub, gallop or click PMI normal Abdomen: benighn, BS positve, no tenderness, no AAA no bruit.  No HSM or HJR Distal pulses intact with no bruits No edema Neuro non-focal UE tremors  Skin warm and dry No muscular weakness   Labs:   Lab Results  Component Value Date   WBC 3.6 (L) 02/02/2021   HGB 11.7 (L) 02/02/2021   HCT 34.5 (L) 02/02/2021   MCV 88.0 02/02/2021   PLT 230 02/02/2021   No results for input(s): "NA", "K", "CL", "CO2", "BUN", "CREATININE", "CALCIUM", "PROT", "BILITOT", "ALKPHOS", "ALT", "AST", "GLUCOSE" in the last 168 hours.  Invalid input(s): "LABALBU" No results found for: "CKTOTAL", "CKMB", "CKMBINDEX", "TROPONINI"  Lab Results  Component Value Date   CHOL 220 (H) 07/02/2020   CHOL 206 (H) 12/25/2019   CHOL 248 (H) 06/27/2019   Lab Results  Component Value Date   HDL 100 07/02/2020   HDL 84 12/25/2019   HDL 95 06/27/2019   Lab Results  Component Value Date   LDLCALC 109 (H) 07/02/2020   LDLCALC 109 (H) 12/25/2019   LDLCALC 140 (H) 06/27/2019   Lab Results  Component Value Date   TRIG 37 07/02/2020   TRIG 43 12/25/2019   TRIG 43 06/27/2019   Lab Results  Component Value Date   CHOLHDL 2.2 07/02/2020   CHOLHDL 2.5 12/25/2019   CHOLHDL 2.6 06/27/2019   No results found for: "LDLDIRECT"    Radiology: No results found.  EKG: 07/12/18 SR rate 62 normal    ASSESSMENT AND PLAN:   1. Chest Pain: atypical normal ECG normal myovue 2014and 2021 observe 2. HTN:  Continue daily beta blocker for BP and tremor  F/U TAT  3. ETOH/Abuse:  Sober residual anxiety declined  librium from primary  4. HLD:  F/u primary LDL 109   primary ordered calcium score for 05/06/22  5. Cramps:  Non vascular with normal ABI's 11/15/18  6. Dyspnea:  History of some asthma Former smoker Animator  Loanne Drilling from pulmonary   EF low normal trivial MR/AR should not be clinically significant suspect it has been seasonal allergies f/u pulmonary and allergy Uses albuterol prior to exercise Negative GI evaluation for reflux Prescribed Incruse Ellipta but had allergy to it 7. Tremor:  essential follows with [facility]. Has been on propanolol a long time and he does not feel it affects asthma   Primary ordered calcium score   F/U cardiology in a year  Signed: Jenkins Rouge 04/20/2022, 7:56 AM

## 2022-04-07 ENCOUNTER — Encounter (HOSPITAL_BASED_OUTPATIENT_CLINIC_OR_DEPARTMENT_OTHER): Payer: Self-pay | Admitting: Pulmonary Disease

## 2022-04-14 NOTE — Telephone Encounter (Signed)
Dr Donna [name]- pt stopped incruse due to urinary symptoms. Please see below and advise any recommendations. Thank you.      Hi Donna [name], Yes definitely improvement in urinary symptoms as well as bowel. I'm back to normal. Breathing is ok. I did breathe better on the incruise, but unable to function well with symptoms.  Thank you for your reply Donna Geraghty, RN  Donna [name] hours ago (12:50 PM)    Donna [name],  Thank you for letting us know. Since you stopped taking the incruse have you noticed any improvement in your urinary symptoms? How is your breathing doing now? Thanks.     Donna [name]  P [facility] (supporting Donna Rodman Pickle, MD)7 days ago    [name] I have taken the incruise inhaler for 12 days. I have increased bladder and urination pain and burning, diarrhea (intolerance to milk).  I stopped taking the medicine yesterday. I hope that's ok.  The frequent bathroom visits are interfering with my work.   Thank you  Donna [name] [phone]

## 2022-04-20 ENCOUNTER — Encounter: Payer: Self-pay | Admitting: Cardiovascular Disease

## 2022-04-20 ENCOUNTER — Ambulatory Visit: Payer: Medicare HMO | Attending: Cardiovascular Disease | Admitting: Cardiovascular Disease

## 2022-04-20 VITALS — BP 130/80 | HR 68 | Ht 67.0 in | Wt 163.2 lb

## 2022-04-20 DIAGNOSIS — E785 Hyperlipidemia, unspecified: Secondary | ICD-10-CM | POA: Diagnosis not present

## 2022-04-20 DIAGNOSIS — R0602 Shortness of breath: Secondary | ICD-10-CM | POA: Diagnosis not present

## 2022-04-20 DIAGNOSIS — I10 Essential (primary) hypertension: Secondary | ICD-10-CM | POA: Diagnosis not present

## 2022-04-20 NOTE — Patient Instructions (Signed)
Medication Instructions:  Your physician recommends that you continue on your current medications as directed. Please refer to the Current Medication list given to you today.  *If you need a refill on your cardiac medications before your next appointment, please call your pharmacy*  Lab Work: If you have labs (blood work) drawn today and your tests are completely normal, you will receive your results only by: MyChart Message (if you have MyChart) OR A paper copy in the mail If you have any lab test that is abnormal or we need to change your treatment, we will call you to review the results.  Testing/Procedures: None ordered today.  Follow-Up: At [facility], you and your health needs are our priority.  As part of our continuing mission to provide you with exceptional heart care, we have created designated Provider Care Teams.  These Care Teams include your primary Cardiologist (physician) and Advanced Practice Providers (APPs -  Physician Assistants and Nurse Practitioners) who all work together to provide you with the care you need, when you need it.  We recommend signing up for the patient portal called "MyChart".  Sign up information is provided on this After Visit Summary.  MyChart is used to connect with patients for Virtual Visits (Telemedicine).  Patients are able to view lab/test results, encounter notes, upcoming appointments, etc.  Non-urgent messages can be sent to your provider as well.   To learn more about what you can do with MyChart, go to [email or web address].    Your next appointment:   1 year(s)  The format for your next appointment:   In Person  Provider:   Peter Nishan, MD     Important Information About Sugar       

## 2022-04-27 ENCOUNTER — Ambulatory Visit
Admission: EM | Admit: 2022-04-27 | Discharge: 2022-04-27 | Disposition: A | Discharge status: Home / Self Care | Payer: Medicare HMO | Attending: Urgent Care | Admitting: Urgent Care

## 2022-04-27 DIAGNOSIS — S0083XA Contusion of other part of head, initial encounter: Secondary | ICD-10-CM

## 2022-04-27 DIAGNOSIS — S0081XA Abrasion of other part of head, initial encounter: Secondary | ICD-10-CM

## 2022-04-27 DIAGNOSIS — R519 Headache, unspecified: Secondary | ICD-10-CM | POA: Diagnosis not present

## 2022-04-27 MED ORDER — ACETAMINOPHEN 325 MG PO TABS: 650.0000 mg | ORAL_TABLET | Freq: Four times a day (QID) | ORAL | 0 refills | Status: DC | PRN

## 2022-04-27 MED ORDER — METHOCARBAMOL 500 MG PO TABS: 500.0000 mg | ORAL_TABLET | Freq: Two times a day (BID) | ORAL | 0 refills | Status: DC

## 2022-04-27 NOTE — ED Provider Notes (Signed)
[facility]  Note:  This document was prepared using Systems analyst and may include unintentional dictation errors.  MRN: [id number] DOB: October 16, 1950  Subjective:   Donna [name] is a 71 y.o. female presenting for possible head injury.  Patient was speed walking with her head down.  She suddenly ran into a large wooden cross that made impact across the front part of her face.  No loss consciousness, fall to the floor, confusion, vision change, weakness, numbness or tingling, difficulty with her speech.  Tdap is up-to-date.   Current Facility-Administered Medications:    0.9 %  sodium chloride infusion, 500 mL, Intravenous, Continuous, Ladene Artist, MD  Current Outpatient Medications:    albuterol (VENTOLIN HFA) 108 (90 Base) MCG/ACT inhaler, Inhale 2 puffs into the lungs every 4 (four) hours as needed., Disp: , Rfl:    b complex vitamins tablet, Take 1 tablet by mouth daily.  , Disp: , Rfl:    calcium gluconate 500 MG tablet, Take 500 mg by mouth daily., Disp: , Rfl:    estradiol (ESTRACE) 0.1 MG/GM vaginal cream, SMARTSIG:1 Applicator Vaginal Once a Week, Disp: , Rfl:    lisinopril (ZESTRIL) 5 MG tablet, TAKE 1 TABLET BY MOUTH DAILY FOR ELEVATED BP WHILE TRAVELING, Disp: 90 tablet, Rfl: 1   Loratadine (CLARITIN PO), Take by mouth., Disp: , Rfl:    Magnesium 500 MG TABS, Take 1 tablet by mouth daily.  , Disp: , Rfl:    Multiple Vitamin (MULTIVITAMIN) capsule, Take 1 capsule by mouth daily.  , Disp: , Rfl:    propranolol (INDERAL) 10 MG tablet, Take 10 mg by mouth as needed., Disp: , Rfl:    umeclidinium bromide (INCRUSE ELLIPTA) 62.5 MCG/ACT AEPB, Inhale 1 puff into the lungs daily., Disp: 30 each, Rfl: 2   valACYclovir (VALTREX) 500 MG tablet, ONE BY MOUTH TWICE A DAY X 5 DAYS FOR HERPES OUTBREAK, Disp: 10 tablet, Rfl: prn   Allergies  Allergen Reactions   Ciprofloxacin     Neuropathy    Doxycycline Nausea Only   Gluten Meal     GI  upset    Incruse Ellipta [Umeclidinium Bromide]     Urinary retention   Penicillins Nausea And Vomiting    "breaking out"   Quinolones Other (See Comments)    REACTION: sick / neuropathy     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   chest - CX3 + 5FU   Squamous cell carcinoma of skin 09/27/2014   left thigh - tx p bx   Substance abuse ([place])      Past Surgical History:  Procedure Laterality Date   ANKLE SURGERY     Bilateral    CATARACT EXTRACTION Bilateral    CERVICAL SPINE SURGERY  07/21/2021   TUBAL LIGATION      Family History  Problem Relation Age of Onset   Dementia Mother    Diabetes Mother    Melanoma Father    Colon cancer Father 69  Survived   Heart disease Father    Heart attack Father    Rheum arthritis Father    Dementia Father    Healthy Sister    Healthy Brother    Dementia Maternal Aunt    Dementia Maternal Uncle    Colon polyps Paternal Aunt    Pancreatic cancer Paternal Aunt    Rectal cancer Paternal Aunt    Colon cancer Paternal Aunt    Colon cancer Paternal Grandmother    Breast cancer Maternal Grandmother    Stomach cancer Neg Hx    Esophageal cancer Neg Hx    Liver cancer Neg Hx     Social History   Tobacco Use   Smoking status: Former    Years: 30.00    Types: Cigarettes    Quit date: 10/15/1996    Years since quitting: 25.5   Smokeless tobacco: Never  Vaping Use   Vaping Use: Never used  Substance Use Topics   Alcohol use: No   Drug use: No    ROS   Objective:   Vitals: BP 115/66 (BP Location: Left Arm)   Pulse 67   Temp 98.4 F (36.9 C) (Oral)   Resp 18    SpO2 96%   Physical Exam Constitutional:      General: She is not in acute distress.    Appearance: Normal appearance. She is well-developed and normal weight. She is not ill-appearing, toxic-appearing or diaphoretic.  HENT:     Head: Normocephalic and atraumatic.     Comments: Slight abrasion to the front part of her face including the nose and forehead.  No bleeding.  Minimal tenderness.    Right Ear: Tympanic membrane, ear canal and external ear normal. No drainage or tenderness. No middle ear effusion. There is no impacted cerumen. Tympanic membrane is not erythematous or bulging.     Left Ear: Tympanic membrane, ear canal and external ear normal. No drainage or tenderness.  No middle ear effusion. There is no impacted cerumen. Tympanic membrane is not erythematous or bulging.     Nose: Nose normal. No congestion or rhinorrhea.     Mouth/Throat:     Mouth: Mucous membranes are moist. No oral lesions.     Pharynx: No pharyngeal swelling, oropharyngeal exudate, posterior oropharyngeal erythema or uvula swelling.     Tonsils: No tonsillar exudate or tonsillar abscesses.  Eyes:     General: No scleral icterus.       Right eye: No discharge.        Left eye: No discharge.     Extraocular Movements: Extraocular movements intact.     Right eye: Normal extraocular motion.     Left eye: Normal extraocular motion.     Conjunctiva/sclera: Conjunctivae normal.  Neck:     Meningeal: Brudzinski's sign and Kernig's sign absent.  Cardiovascular:     Rate and Rhythm: Normal rate.  Pulmonary:     Effort: Pulmonary effort is normal.  Musculoskeletal:     Cervical back: Normal range of motion and neck supple.  Lymphadenopathy:     Cervical: No cervical adenopathy.  Skin:    General: Skin is warm and dry.  Neurological:     General: No focal deficit present.     Mental Status: She is alert and oriented to person, place, and time.     Cranial Nerves: No cranial nerve deficit, dysarthria or  facial asymmetry.     Motor: No weakness or pronator drift.     Coordination: Romberg sign  negative. Coordination normal. Finger-Nose-Finger Test and Heel to [facility] Test normal. Rapid alternating movements normal.     Gait: Gait and tandem walk normal.     Deep Tendon Reflexes: Reflexes normal.  Psychiatric:        Mood and Affect: Mood normal.        Behavior: Behavior normal.        Thought Content: Thought content normal.        Judgment: Judgment normal.     Assessment and Plan :   PDMP not reviewed this encounter.  1. Facial contusion, initial encounter   2. Facial abrasion, initial encounter   3. Facial pain     Recommended conservative management for facial contusion, abrasion.  Discussed signs and symptoms of an intracranial injury.  Recommend Tylenol, muscle relaxant for now. Counseled patient on potential for adverse effects with medications prescribed/recommended today, ER and return-to-clinic precautions discussed, patient verbalized understanding.    Jaynee Eagles, [place] 04/28/22 [phone]

## 2022-04-27 NOTE — ED Triage Notes (Signed)
Pt states she was speed walking with head down and ran into a wooden cross ~2.5 hrs PTA-no LOC-c/o HA and has slight abrasion to nose-NAD-steady gait

## 2022-04-29 ENCOUNTER — Telehealth: Payer: Self-pay

## 2022-04-29 DIAGNOSIS — M47816 Spondylosis without myelopathy or radiculopathy, lumbar region: Secondary | ICD-10-CM | POA: Diagnosis not present

## 2022-04-29 NOTE — Telephone Encounter (Addendum)
Pt called stating she is now having an increased pressure feeling in her head and behind her eyes. The patient states she does not know if she should go to the ED for further evaluation. The patient was made aware that it is best to go and get re-evaluated since her symptoms from her last visit are not getting any better per provider. Pt acknowledges understanding and states she is going to go to the ED.

## 2022-04-29 NOTE — Telephone Encounter (Signed)
Attempt to return patient call, no answer.

## 2022-04-29 NOTE — Telephone Encounter (Signed)
Attempt to return patient call, voice mail was left.

## 2022-04-30 ENCOUNTER — Other Ambulatory Visit: Payer: Self-pay

## 2022-04-30 ENCOUNTER — Encounter (HOSPITAL_BASED_OUTPATIENT_CLINIC_OR_DEPARTMENT_OTHER): Payer: Self-pay | Admitting: Emergency Medicine

## 2022-04-30 ENCOUNTER — Emergency Department (HOSPITAL_BASED_OUTPATIENT_CLINIC_OR_DEPARTMENT_OTHER): Payer: Medicare HMO

## 2022-04-30 ENCOUNTER — Emergency Department (HOSPITAL_BASED_OUTPATIENT_CLINIC_OR_DEPARTMENT_OTHER)
Admission: EM | Admit: 2022-04-30 | Discharge: 2022-04-30 | Disposition: A | Discharge status: Home / Self Care | Payer: Medicare HMO | Attending: Emergency Medicine | Admitting: Emergency Medicine

## 2022-04-30 DIAGNOSIS — S199XXA Unspecified injury of neck, initial encounter: Secondary | ICD-10-CM | POA: Diagnosis not present

## 2022-04-30 DIAGNOSIS — S0990XA Unspecified injury of head, initial encounter: Secondary | ICD-10-CM

## 2022-04-30 DIAGNOSIS — I6789 Other cerebrovascular disease: Secondary | ICD-10-CM | POA: Insufficient documentation

## 2022-04-30 DIAGNOSIS — G319 Degenerative disease of nervous system, unspecified: Secondary | ICD-10-CM | POA: Diagnosis not present

## 2022-04-30 DIAGNOSIS — W228XXA Striking against or struck by other objects, initial encounter: Secondary | ICD-10-CM | POA: Diagnosis not present

## 2022-04-30 DIAGNOSIS — Y9289 Other specified places as the place of occurrence of the external cause: Secondary | ICD-10-CM | POA: Diagnosis not present

## 2022-04-30 DIAGNOSIS — S0081XA Abrasion of other part of head, initial encounter: Secondary | ICD-10-CM | POA: Insufficient documentation

## 2022-04-30 DIAGNOSIS — S161XXA Strain of muscle, fascia and tendon at neck level, initial encounter: Secondary | ICD-10-CM | POA: Insufficient documentation

## 2022-04-30 DIAGNOSIS — S098XXA Other specified injuries of head, initial encounter: Secondary | ICD-10-CM | POA: Diagnosis not present
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
## 2022-04-30 NOTE — ED Notes (Signed)
Dc instructions reviewed with patient. Patient voiced understanding. Dc with belongings.  °
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
## 2022-05-06 ENCOUNTER — Ambulatory Visit (HOSPITAL_BASED_OUTPATIENT_CLINIC_OR_DEPARTMENT_OTHER)
Admission: RE | Admit: 2022-05-06 | Discharge: 2022-05-06 | Disposition: A | Discharge status: Home / Self Care | Payer: Medicare HMO | Source: Ambulatory Visit | Attending: Family Medicine | Admitting: Family Medicine

## 2022-05-06 ENCOUNTER — Encounter: Payer: Self-pay | Admitting: Cardiovascular Disease

## 2022-05-06 DIAGNOSIS — Z136 Encounter for screening for cardiovascular disorders: Secondary | ICD-10-CM | POA: Insufficient documentation

## 2022-05-12 ENCOUNTER — Ambulatory Visit: Payer: Medicare HMO | Admitting: Sports Medicine

## 2022-05-18 ENCOUNTER — Encounter: Payer: Self-pay | Admitting: Physical Therapy

## 2022-05-18 ENCOUNTER — Ambulatory Visit: Payer: Medicare HMO | Attending: Neurosurgery | Admitting: Physical Therapy

## 2022-05-18 DIAGNOSIS — M62830 Muscle spasm of back: Secondary | ICD-10-CM

## 2022-05-18 DIAGNOSIS — M5459 Other low back pain: Secondary | ICD-10-CM | POA: Diagnosis not present
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
## 2022-05-20 DIAGNOSIS — E559 Vitamin D deficiency, unspecified: Secondary | ICD-10-CM | POA: Diagnosis not present

## 2022-05-20 DIAGNOSIS — Z79899 Other long term (current) drug therapy: Secondary | ICD-10-CM | POA: Diagnosis not present

## 2022-05-20 DIAGNOSIS — S060X9A Concussion with loss of consciousness of unspecified duration, initial encounter: Secondary | ICD-10-CM | POA: Diagnosis not present

## 2022-05-20 DIAGNOSIS — R946 Abnormal results of thyroid function studies: Secondary | ICD-10-CM | POA: Diagnosis not present

## 2022-05-20 DIAGNOSIS — G250 Essential tremor: Secondary | ICD-10-CM | POA: Diagnosis not present

## 2022-05-20 DIAGNOSIS — G47419 Narcolepsy without cataplexy: Secondary | ICD-10-CM | POA: Diagnosis not present

## 2022-05-20 DIAGNOSIS — E538 Deficiency of other specified B group vitamins: Secondary | ICD-10-CM | POA: Diagnosis not present

## 2022-05-20 DIAGNOSIS — R413 Other amnesia: Secondary | ICD-10-CM | POA: Diagnosis not present

## 2022-05-20 DIAGNOSIS — I700 Atherosclerosis of aorta: Secondary | ICD-10-CM | POA: Diagnosis not present

## 2022-05-20 DIAGNOSIS — Z13220 Encounter for screening for lipoid disorders: Secondary | ICD-10-CM | POA: Diagnosis not present

## 2022-05-26 ENCOUNTER — Ambulatory Visit: Payer: Medicare HMO | Admitting: Physical Therapy

## 2022-05-27 ENCOUNTER — Encounter (HOSPITAL_BASED_OUTPATIENT_CLINIC_OR_DEPARTMENT_OTHER): Payer: Self-pay | Admitting: Pulmonary Disease

## 2022-05-27 ENCOUNTER — Telehealth (HOSPITAL_BASED_OUTPATIENT_CLINIC_OR_DEPARTMENT_OTHER): Payer: Self-pay | Admitting: Pulmonary Disease

## 2022-05-27 ENCOUNTER — Other Ambulatory Visit (HOSPITAL_BASED_OUTPATIENT_CLINIC_OR_DEPARTMENT_OTHER): Payer: Self-pay | Admitting: Pulmonary Disease

## 2022-05-27 ENCOUNTER — Ambulatory Visit (INDEPENDENT_AMBULATORY_CARE_PROVIDER_SITE_OTHER): Payer: Medicare HMO | Admitting: Pulmonary Disease

## 2022-05-27 VITALS — BP 126/66 | HR 56 | Ht 67.0 in | Wt 157.0 lb

## 2022-05-27 DIAGNOSIS — J4520 Mild intermittent asthma, uncomplicated: Secondary | ICD-10-CM | POA: Diagnosis not present

## 2022-05-27 DIAGNOSIS — L300 Nummular dermatitis: Secondary | ICD-10-CM | POA: Diagnosis not present

## 2022-05-27 MED ORDER — AIRSUPRA 90-80 MCG/ACT IN AERO: 2.0000 | INHALATION_SPRAY | Freq: Four times a day (QID) | RESPIRATORY_TRACT | 3 refills | Status: DC | PRN

## 2022-05-27 MED ORDER — BUDESONIDE-FORMOTEROL FUMARATE 80-4.5 MCG/ACT IN AERO: 2.0000 | INHALATION_SPRAY | Freq: Four times a day (QID) | RESPIRATORY_TRACT | 2 refills | Status: DC | PRN

## 2022-05-27 NOTE — Progress Notes (Signed)
Subjective:   PATIENT ID: Donna [name] GENDER: female DOB: 09-08-1950, MRN: [id number]  Chief Complaint  Patient presents with   Follow-up    Cough a lot noting coming up    Reason for Visit: Asthma follow-up  Ms. Donna [name] is a 72 year old female former smoker with adult onset asthma, HTN, HLD, essential tremor, IBS, HLD who present for asthma follow-up  Initial consult 03/2022 She was diagnosed with asthma in 2020/2021 with methacholine test in [place]. Previously on Flovent and Alvesco but unable to tolerate due to nausea. She reports unable to take prednisone due severe irritability. She was last seen by Allergy Asthma for initial asthma evaluation in May 2023. On albuterol as needed for stable symptoms. Has previously tried ICS inhaler with no improvement.  Today she reports shortness of breath with walking. Also having non-productive cough. Denies wheezing. Uses albuterol prior to exercise. Cold air, cold food triggers and anxiety as well. Chemicals, strong odors will trigger. No recent exaerbations requiring treatment with antibiotics.  Has been evaluated by GI for reflux and negative. Previously had allergy testing which is negative.   05/27/22 She was not able to tolerate Incruse due to urinary retention. She reports asthma attack associated with cold air and exercise. Used albuterol inhaler once every 2 weeks.  Faxed notes from Dr. Renold Genta and Michela Pitcher in 2021-2023 reviewed. Notes report +methacholine challenge with 21% decrease in FEV1 discussed on 12/13/19 visit. Previously on Flovent. Sleep study was ordered on 04/29/21 for snoring and frequent nocturnal awakeneings. Falls asleep in meetings and fatigued.  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   chest - CX3 + 5FU   Squamous cell carcinoma of skin 09/27/2014   left thigh - tx p bx   Substance abuse ([place])      Family History  Problem Relation Age of Onset   Dementia Mother    Diabetes Mother    Melanoma Father    Colon cancer Father 56       Survived   Heart disease Father    Heart attack Father    Rheum arthritis Father    Dementia Father    Healthy Sister    Healthy Brother    Dementia Maternal Aunt    Dementia Maternal Uncle    Colon polyps Paternal Aunt    Pancreatic cancer Paternal Aunt    Rectal cancer Paternal Aunt    Colon cancer Paternal Aunt    Colon cancer Paternal Grandmother    Breast cancer Maternal Grandmother    Stomach cancer Neg Hx    Esophageal cancer Neg Hx    Liver cancer Neg Hx      Social History   Occupational History   Occupation: [employer]  Tobacco Use   Smoking  status: Former    Years: 30.00    Types: Cigarettes    Quit date: 10/15/1996    Years since quitting: 25.6   Smokeless tobacco: Never  Vaping Use   Vaping Use: Never used  Substance and Sexual Activity   Alcohol use: No   Drug use: No   Sexual activity: Yes    Allergies  Allergen Reactions   Ciprofloxacin     Neuropathy    Doxycycline Nausea Only   Gluten Meal     GI upset    Incruse Ellipta [Umeclidinium Bromide]     Urinary retention   Penicillins Nausea And Vomiting    "breaking out"   Quinolones Other (See Comments)    REACTION: sick / neuropathy      Outpatient Medications Prior to Visit  Medication Sig Dispense Refill   albuterol (VENTOLIN HFA) 108 (90 Base) MCG/ACT inhaler Inhale 2 puffs into the lungs  every 4 (four) hours as needed.     b complex vitamins tablet Take 1 tablet by mouth daily.       calcium gluconate 500 MG tablet Take 500 mg by mouth daily.     estradiol (ESTRACE) 0.1 MG/GM vaginal cream SMARTSIG:1 Applicator Vaginal Once a Week     lisinopril (ZESTRIL) 5 MG tablet TAKE 1 TABLET BY MOUTH DAILY FOR ELEVATED BP WHILE TRAVELING 90 tablet 1   Loratadine (CLARITIN PO) Take by mouth.     Magnesium 500 MG TABS Take 1 tablet by mouth daily.       Multiple Vitamin (MULTIVITAMIN) capsule Take 1 capsule by mouth daily.       propranolol (INDERAL) 10 MG tablet Take 10 mg by mouth as needed.     valACYclovir (VALTREX) 500 MG tablet ONE BY MOUTH TWICE A DAY X 5 DAYS FOR HERPES OUTBREAK 10 tablet prn   acetaminophen (TYLENOL) 325 MG tablet Take 2 tablets (650 mg total) by mouth every 6 (six) hours as needed for moderate pain. 30 tablet 0   methocarbamol (ROBAXIN) 500 MG tablet Take 1 tablet (500 mg total) by mouth 2 (two) times daily. 20 tablet 0   umeclidinium bromide (INCRUSE ELLIPTA) 62.5 MCG/ACT AEPB Inhale 1 puff into the lungs daily. 30 each 2   Facility-Administered Medications Prior to Visit  Medication Dose Route Frequency Provider Last Rate Last Admin   0.9 %  sodium chloride infusion  500 mL Intravenous Continuous Ladene Artist, MD        Review of Systems  Constitutional:  Negative for chills, diaphoresis, fever, malaise/fatigue and weight loss.  HENT:  Negative for congestion.   Respiratory:  Negative for cough, hemoptysis, sputum production, shortness of breath and wheezing.   Cardiovascular:  Negative for chest pain, palpitations and leg swelling.     Objective:   Vitals:   05/27/22 0838  BP: 126/66  Pulse: (!) 56  SpO2: 97%  Weight: 157 lb (71.2 kg)  Height: '5\' 7"'$  (1.702 m)    SpO2: 97 % O2 Device: None (Room air)  Physical Exam: General: Well-appearing, no acute distress HENT: [place], AT Eyes: EOMI, no scleral icterus Respiratory: Clear to auscultation  bilaterally.  No crackles, wheezing or rales Cardiovascular: RRR, -M/R/G, no JVD Extremities:-Edema,-tenderness Neuro: AAO x4, CNII-XII grossly intact Psych: Normal mood, normal affect  Data Reviewed:  Imaging: CT Chest 06/16/21 ([facility] report only) - 2 mm LL nodule, decreased compared to prior CT Cardiac 05/06/22 - Visualized lung parenchyma with stable 42m LLL lung nodule. No masses, infiltrate, effusion or  pneumothorax.  PFT: 09/07/19 FVC 3.07 (86%) FEV1 2.35 (86%) Ratio 76  TLC 74% DLCO 93% Interpretation: Mild obstructive and restrictive defect. Normal DLCO  Spirometry 08/25/21 FVC 2.25 (71%) FEV1 1.42 (58%) Ratio 0.63  Spirometry 10/03/21 FVC 1.98 (62%) FEV1 1.5 (61%) Ratio 0.76  Labs: CBC    Component Value Date/Time   WBC 3.6 (L) 02/02/2021 0545   RBC 3.92 02/02/2021 0545   HGB 11.7 (L) 02/02/2021 0545   HCT 34.5 (L) 02/02/2021 0545   PLT 230 02/02/2021 0545   MCV 88.0 02/02/2021 0545   MCH 29.8 02/02/2021 0545   MCHC 33.9 02/02/2021 0545   RDW 12.6 02/02/2021 0545   LYMPHSABS 882 07/02/2020 0924   MONOABS 0.5 01/18/2019 1228   EOSABS 61 07/02/2020 0924   BASOSABS 30 07/02/2020 0924   Abs eos 07/02/20 - 61     Assessment & Plan:   Discussion: 72 year old female former smoker with adult onset asthma, HTN, HLD, essential tremor, IBS, HLD who present for asthma follow-up. Unable to tolerated ICS due to agitation (previously on Flovent). Unable to tolerate LAMA due to urinary retention. Discussed option for ICS/SABA PRN. Provided coupon for Airsupra. If cost becomes an issue, consider low dose Symbicort PRN in the future.   Intermittent asthma --START Airsupra 1-2 puffs every 6 hours as needed --Encourage regular aerobic exercise daily  Left lower lobe lung nodule, benign --No further follow-up    Health Maintenance Immunization History  Administered Date(s) Administered   Influenza,trivalent, recombinat, inj, PF 02/16/2014   Influenza-Unspecified  02/22/2014, 02/05/2015, 02/05/2015   PFIZER Comirnaty(Gray Top)Covid-19 Tri-Sucrose Vaccine 06/11/2020   PFIZER(Purple Top)SARS-COV-2 Vaccination 07/09/2019, 08/02/2019, 12/28/2019   Tdap 05/26/2007, 07/12/2018   CT Lung Screen- not qualified. Quit smoking >15 years  No orders of the defined types were placed in this encounter.  Meds ordered this encounter  Medications   Albuterol-Budesonide (AIRSUPRA) 90-80 MCG/ACT AERO    Sig: Inhale 2 puffs into the lungs every 6 (six) hours as needed.    Dispense:  10.7 g    Refill:  3    Return in about 4 months (around 09/25/2022). May 2024  I have spent a total time of 35-minutes on the day of the appointment including chart review, data review, collecting history, coordinating care and discussing medical diagnosis and plan with the patient/family. Past medical history, allergies, medications were reviewed. Pertinent imaging, labs and tests included in this note have been reviewed and interpreted independently by me.  [place], MD [facility] 05/27/2022 10:14 AM  Office Number [phone]

## 2022-05-27 NOTE — Patient Instructions (Signed)
Intermittent asthma --START Airsupra 1-2 puffs every 6 hours as needed  Left lower lobe lung nodule, benign --No further follow-up   Follow-up with me in May 2024

## 2022-05-27 NOTE — Telephone Encounter (Signed)
Discontinued Airsupra Rx low dose Symbicort as needed for shortness of breath or wheezing. Counseled patient on inhaler use

## 2022-06-01 ENCOUNTER — Ambulatory Visit: Payer: Medicare HMO | Admitting: Physical Therapy

## 2022-06-03 ENCOUNTER — Ambulatory Visit: Payer: Medicare HMO | Admitting: Physical Therapy

## 2022-06-03 ENCOUNTER — Encounter: Payer: Self-pay | Admitting: Physical Therapy

## 2022-06-03 DIAGNOSIS — M5459 Other low back pain: Secondary | ICD-10-CM

## 2022-06-03 DIAGNOSIS — M62830 Muscle spasm of back: Secondary | ICD-10-CM | POA: Diagnosis not present

## 2022-06-03 NOTE — Therapy (Signed)
OUTPATIENT PHYSICAL THERAPY THORACOLUMBAR EVALUATION   Patient Name: Donna [name] MRN: [id number] DOB:10-30-1950, 72 y.o., female Today's Date: 06/03/2022  END OF SESSION:  PT End of Session - 06/03/22 0756     Visit Number 2    Date for PT Re-Evaluation 08/17/22    Authorization Type [employer] does not cover ionto    PT Start Time 0756    PT Stop Time 0841    PT Time Calculation (min) 45 min    Activity Tolerance Patient tolerated treatment well    Behavior During Therapy [facility] for tasks assessed/performed             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   chest - CX3 + 5FU   Squamous cell carcinoma of skin 09/27/2014   left thigh - tx p bx   Substance abuse ([place])    Past Surgical History:  Procedure Laterality Date   ANKLE SURGERY     Bilateral    CATARACT EXTRACTION Bilateral    CERVICAL SPINE SURGERY  07/21/2021   TUBAL LIGATION     Patient Active Problem List   Diagnosis Date Noted   Gasping for breath 08/19/2021   PTSD (post-traumatic stress disorder) 08/19/2021   Parasomnia overlap disorder 08/19/2021   Snoring 08/19/2021   Excessive daytime sleepiness 08/19/2021   Sleep paralysis, recurrent isolated 08/19/2021   Asthma 02/19/2021   Multiple drug allergies 02/19/2021   Gluten intolerance 02/19/2021   Chronic rhinitis 02/19/2021   Metatarsalgia of left foot 11/14/2020   Achilles tendinosis of right lower extremity 11/14/2020   Essential tremor 07/04/2019   DOE (dyspnea on exertion) 01/20/2019   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 06/13/2018   Keratoacanthoma type squamous cell  carcinoma of skin 06/13/2018   Superficial basal cell carcinoma 06/13/2018   Upper airway cough syndrome 06/13/2018   Atrophic vaginitis 02/17/2018   Bilateral leg cramps 02/17/2018   Fatty liver, alcoholic 54/49/2010   Depression 04/19/2016   Withdrawal complaint 04/19/2016   Hx of adenomatous colonic polyps 08/03/2015   Herpes simplex 06/15/2014   GERD (gastroesophageal reflux disease) 10/14/2013   Anxiety state, unspecified 10/14/2013   Recovering alcoholic in remission ([place]) 10/14/2013   Allergic rhinitis 10/14/2013   Adjustment disorder with anxiety 10/14/2013   Elevated low-density lipoprotein level 10/14/2013   Essential hypertension 10/15/2012   Heart murmur 05/25/2012    PCP: [name], DO  REFERRING PROVIDER: [place], MD  REFERRING DIAG: Lumbar spondylosis  Rationale for Evaluation and Treatment: Rehabilitation  THERAPY DIAG:  Other low back pain  Muscle spasm of back  ONSET DATE: Feb. 2023  SUBJECTIVE:  SUBJECTIVE STATEMENT: Patient reports a little better.  Reports that she has not tried the ball exercises.  Did try the gym and no issues, does report some left hip pain  PERTINENT HISTORY:  Has concussion currently, has anteiolisthesis in cervical  PAIN:  Are you having pain? Yes: NPRS scale: 1/10 Pain location: mostly left lumbar area Pain description: constant, sharp Aggravating factors: lying extension, rotation, lifting pain up to 10/10, raking really increased pain long sitting Relieving factors: yoga, ibuprofen  PRECAUTIONS: None  WEIGHT BEARING RESTRICTIONS: No  FALLS:  Has patient fallen in last 6 months? No  LIVING ENVIRONMENT: Lives with: lives with their family and lives with their spouse Lives in: House/apartment Stairs: No Has following equipment at  home: None  OCCUPATION: yoga instructor  PLOF: Independent and daily yoga, yardwork, housework PATIENT GOALS: have less pain  NEXT MD VISIT:  none scheduled OBJECTIVE:   DIAGNOSTIC FINDINGS:  Disc bulges from L3-S1. Annular tear L3-4  SCREENING FOR RED FLAGS: Bowel or bladder incontinence: No Spinal tumors: No Cauda equina syndrome: No Compression fracture: No Abdominal aneurysm: No  COGNITION: Overall cognitive status: Within functional limits for tasks assessed     SENSATION: WFL  MUSCLE LENGTH: Good HS, mild tightness piriformis, tight quads  POSTURE: decreased thoracic kyphosis  PALPATION: Very tight and tender in the left lumbar area and into the left buttock  LUMBAR ROM:   AROM eval  Flexion WNL's  Extension Decreased 50%  Right lateral flexion Decreased 50%  Left lateral flexion Decreased 75% P!  Right rotation Decreased 50%  Left rotation Decreased 50%   (Blank rows = not tested)  LOWER EXTREMITY ROM:   WFL LOWER EXTREMITY MMT:  WFL, some weakness in the hips   LUMBAR SPECIAL TESTS:  Slump test: Negative and Long sit test: Positive  GAIT:  no issues  TODAY'S TREATMENT:                                                                                                                              DATE:   06/03/21 Nustep Level 5 x 5 minutes 25# triceps cues for triceps 10# straight arm pulls cues for posture and triceps 15# AR press 10# farmers carry 20# seated row, cues for form 20# lats cues for form Feet on ball K2C, trunk rotation small, posterior isometrics, isometric abs Bird dog rows 5# Negative hip scour test on the left   PATIENT EDUCATION:  Education details: POC and HEP Person educated: Patient Education method: Consulting civil engineer, Media planner, Corporate treasurer cues, Verbal cues, and Handouts Education comprehension: verbalized understanding, returned demonstration, verbal cues required, and tactile cues required  HOME EXERCISE PROGRAM: Access  Code: [email or web address] URL: [email or web address] Date: 06/03/2022 Prepared by: Lum Babe  Exercises - Bird Dog with Taps  - 1 x daily - 7 x weekly - 2 sets - 10 reps - 3 hold  Feet on ball 4 exercises Access Code: [email or web address] URL: [email or web address] Date: 05/18/2022 Prepared by: [name]  [name]  Exercises - Standing Anti-Rotation Press with Anchored Resistance  - 1 x daily - 7 x weekly - 3 sets - 10 reps - 3 hold  ASSESSMENT:  CLINICAL IMPRESSION: Patient doing well overall, had some difficulty with the farmer carry, needed a lot of verbal and tactile cues for form and core activation.  Watching concussion symptoms monitoring HR as we go, no issues today  OBJECTIVE IMPAIRMENTS: decreased activity tolerance, decreased mobility, difficulty walking, decreased ROM, decreased strength, increased muscle spasms, impaired flexibility, improper body mechanics, postural dysfunction, and pain.   REHAB POTENTIAL: Good  CLINICAL DECISION MAKING: Stable/uncomplicated  EVALUATION COMPLEXITY: Low   GOALS: Goals reviewed with patient? Yes  SHORT TERM GOALS: Target date: 06/01/22  Independent with initial HEP Goal status: met  LONG TERM GOALS: Target date: 08/17/22  Independent with advanced HEP and gym Goal status: progressing  2.  Report pain decreased overall 50% Goal status: INITIAL  3.  Increase lumbar ROM 25% Goal status: INITIAL  4.  Rake and do housework without pain > 4/10 Goal status: progressing PLAN:  PT FREQUENCY: 1x/week  PT DURATION: 12 weeks  PLANNED INTERVENTIONS: Therapeutic exercises, Therapeutic activity, Neuromuscular re-education, Balance training, Gait training, Patient/Family education, Self Care, Joint mobilization, Dry Needling, Electrical stimulation, Spinal mobilization, Moist heat, Traction, and Manual therapy.  PLAN FOR NEXT SESSION: continue to advance as tolerated   Prentiss Hammett W, PT 06/03/2022, 7:57 AM

## 2022-06-09 ENCOUNTER — Ambulatory Visit: Payer: Medicare HMO | Admitting: Physical Therapy

## 2022-06-11 ENCOUNTER — Ambulatory Visit: Payer: Medicare HMO | Admitting: Physical Therapy

## 2022-06-15 ENCOUNTER — Ambulatory Visit: Payer: Medicare HMO | Admitting: Physical Therapy

## 2022-06-17 ENCOUNTER — Ambulatory Visit: Payer: Medicare HMO | Admitting: Physical Therapy

## 2022-06-24 ENCOUNTER — Encounter: Payer: Self-pay | Admitting: Physical Therapy

## 2022-06-24 ENCOUNTER — Ambulatory Visit: Payer: Medicare HMO | Attending: Neurosurgery | Admitting: Physical Therapy

## 2022-06-24 DIAGNOSIS — M62830 Muscle spasm of back: Secondary | ICD-10-CM | POA: Diagnosis not present

## 2022-06-24 DIAGNOSIS — M79672 Pain in left foot: Secondary | ICD-10-CM | POA: Insufficient documentation

## 2022-06-24 DIAGNOSIS — R262 Difficulty in walking, not elsewhere classified: Secondary | ICD-10-CM | POA: Insufficient documentation

## 2022-06-24 DIAGNOSIS — M5459 Other low back pain: Secondary | ICD-10-CM | POA: Insufficient documentation

## 2022-06-24 NOTE — Therapy (Signed)
OUTPATIENT PHYSICAL THERAPY THORACOLUMBAR TREATMENT   Patient Name: Donna [name] MRN: [id number] DOB:09-09-1950, 72 y.o., female Today's Date: 06/24/2022  END OF SESSION:  PT End of Session - 06/24/22 0803     Visit Number 3    Date for PT Re-Evaluation 08/17/22    Authorization Type [employer] does not cover ionto    PT Start Time 0756    PT Stop Time 0842    PT Time Calculation (min) 46 min    Activity Tolerance Patient tolerated treatment well    Behavior During Therapy 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   chest - CX3 + 5FU   Squamous cell carcinoma of skin 09/27/2014   left thigh - tx p bx   Substance abuse ([place])    Past Surgical History:  Procedure Laterality Date   ANKLE SURGERY     Bilateral    CATARACT EXTRACTION Bilateral    CERVICAL SPINE SURGERY  07/21/2021   TUBAL LIGATION     Patient Active Problem List   Diagnosis Date Noted   Gasping for breath 08/19/2021   PTSD (post-traumatic stress disorder) 08/19/2021   Parasomnia overlap disorder 08/19/2021   Snoring 08/19/2021   Excessive daytime sleepiness 08/19/2021   Sleep paralysis, recurrent isolated 08/19/2021   Asthma 02/19/2021   Multiple drug allergies 02/19/2021   Gluten intolerance 02/19/2021   Chronic rhinitis 02/19/2021   Metatarsalgia of left foot 11/14/2020   Achilles tendinosis of right lower extremity 11/14/2020   Essential tremor 07/04/2019   DOE (dyspnea on exertion) 01/20/2019   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 06/13/2018   Keratoacanthoma type squamous cell  carcinoma of skin 06/13/2018   Superficial basal cell carcinoma 06/13/2018   Upper airway cough syndrome 06/13/2018   Atrophic vaginitis 02/17/2018   Bilateral leg cramps 02/17/2018   Fatty liver, alcoholic [id number]   Depression 04/19/2016   Withdrawal complaint 04/19/2016   Hx of adenomatous colonic polyps 08/03/2015   Herpes simplex 06/15/2014   GERD (gastroesophageal reflux disease) 10/14/2013   Anxiety state, unspecified 10/14/2013   Recovering alcoholic in remission ([place]) 10/14/2013   Allergic rhinitis 10/14/2013   Adjustment disorder with anxiety 10/14/2013   Elevated low-density lipoprotein level 10/14/2013   Essential hypertension 10/15/2012   Heart murmur 05/25/2012    PCP: [name], DO  REFERRING PROVIDER: Annette Stable, MD  REFERRING DIAG: Lumbar spondylosis  Rationale for Evaluation and Treatment: Rehabilitation  THERAPY DIAG:  Other low back pain  Muscle spasm of back  Pain in left foot  Difficulty in walking, not elsewhere classified  ONSET DATE: Feb. 2023  SUBJECTIVE:  SUBJECTIVE STATEMENT: Patient reports a death in the family, she has been out of pocket and has not done any exercise, she does report doing some cleaning yesterday and really started having back pain due to lifting and bending  PERTINENT HISTORY:  Has concussion currently, has anteiolisthesis in cervical  PAIN:  Are you having pain? Yes: NPRS scale: 3/10 Pain location: mostly left lumbar area Pain description: constant, sharp Aggravating factors: lying extension, rotation, lifting pain up to 10/10, raking really increased pain long sitting Relieving factors: yoga, ibuprofen  PRECAUTIONS: None  WEIGHT BEARING RESTRICTIONS: No  FALLS:  Has patient fallen in last 6 months? No  LIVING  ENVIRONMENT: Lives with: lives with their family and lives with their spouse Lives in: House/apartment Stairs: No Has following equipment at home: None  OCCUPATION: yoga instructor  PLOF: Independent and daily yoga, yardwork, housework PATIENT GOALS: have less pain  NEXT MD VISIT:  none scheduled OBJECTIVE:   DIAGNOSTIC FINDINGS:  Disc bulges from L3-S1. Annular tear L3-4  SCREENING FOR RED FLAGS: Bowel or bladder incontinence: No Spinal tumors: No Cauda equina syndrome: No Compression fracture: No Abdominal aneurysm: No  COGNITION: Overall cognitive status: Within functional limits for tasks assessed     SENSATION: WFL  MUSCLE LENGTH: Good HS, mild tightness piriformis, tight quads  POSTURE: decreased thoracic kyphosis  PALPATION: Very tight and tender in the left lumbar area and into the left buttock  LUMBAR ROM:   AROM eval  Flexion WNL's  Extension Decreased 50%  Right lateral flexion Decreased 50%  Left lateral flexion Decreased 75% P!  Right rotation Decreased 50%  Left rotation Decreased 50%   (Blank rows = not tested)  LOWER EXTREMITY ROM:   WFL LOWER EXTREMITY MMT:  WFL, some weakness in the hips   LUMBAR SPECIAL TESTS:  Slump test: Negative and Long sit test: Positive  GAIT:  no issues  TODAY'S TREATMENT:                                                                                                                              DATE:   06/24/22 Bike level 4 x 6 minutes 2 power bursts Tmill education o12% incline 2.5 mph for 1 minute and then 3.5 mph for 30 seconds Biceps 5# Triceps 25# Bird dog rows Red tband hip abduction 10# sit to stand working on form 10# farmer carry at side and then with 2 hands overhead, and then 5# single arm overhead Reviewed walking program, gym program, weights, sets and form, really went over a plan for her to be able to carry on AR press with red tband  06/03/21 Nustep Level 5 x 5 minutes 25# triceps cues  for triceps 10# straight arm pulls cues for posture and triceps 15# AR press 10# farmers carry 20# seated row, cues for form 20# lats cues for form Feet on ball K2C, trunk rotation small, posterior isometrics, isometric abs Bird dog rows 5# Negative hip  scour test on the left   PATIENT EDUCATION:  Education details: POC and HEP Person educated: Patient Education method: Explanation, Demonstration, Tactile cues, Verbal cues, and Handouts Education comprehension: verbalized understanding, returned demonstration, verbal cues required, and tactile cues required  HOME EXERCISE PROGRAM: Access Code: [email or web address] URL: [email or web address] Date: 06/03/2022 Prepared by: Lum Babe  Exercises - Bird Dog with Taps  - 1 x daily - 7 x weekly - 2 sets - 10 reps - 3 hold  Feet on ball 4 exercises Access Code: [email or web address] URL: [email or web address] Date: 05/18/2022 Prepared by: Lum Babe  Exercises - Standing Anti-Rotation Press with Anchored Resistance  - 1 x daily - 7 x weekly - 3 sets - 10 reps - 3 hold  ASSESSMENT:  CLINICAL IMPRESSION: Patient doing well.  She wanted a good plan for gym and home activities I feel that we spent much of our time today talking about the transition of the exercises to her gym and at home, really focused on form and core activation with all exercises. OBJECTIVE IMPAIRMENTS: decreased activity tolerance, decreased mobility, difficulty walking, decreased ROM, decreased strength, increased muscle spasms, impaired flexibility, improper body mechanics, postural dysfunction, and pain.   REHAB POTENTIAL: Good  CLINICAL DECISION MAKING: Stable/uncomplicated  EVALUATION COMPLEXITY: Low   GOALS: Goals reviewed with patient? Yes  SHORT TERM GOALS: Target date: 06/01/22  Independent with initial HEP Goal status: met  LONG TERM GOALS: Target date: 08/17/22  Independent with advanced HEP and gym Goal status: met  2.   Report pain decreased overall 50% Goal status: met  3.  Increase lumbar ROM 25% Goal status: met  4.  Rake and do housework without pain > 4/10 Goal status: met PLAN:  PT FREQUENCY: 1x/week  PT DURATION: 12 weeks  PLANNED INTERVENTIONS: Therapeutic exercises, Therapeutic activity, Neuromuscular re-education, Balance training, Gait training, Patient/Family education, Self Care, Joint mobilization, Dry Needling, Electrical stimulation, Spinal mobilization, Moist heat, Traction, and Manual therapy.  PLAN FOR NEXT SESSION: d/c goals met   Sumner Boast, PT 06/24/2022, 8:04 AM

## 2022-06-29 DIAGNOSIS — L918 Other hypertrophic disorders of the skin: Secondary | ICD-10-CM | POA: Diagnosis not present

## 2022-06-29 DIAGNOSIS — L820 Inflamed seborrheic keratosis: Secondary | ICD-10-CM | POA: Diagnosis not present

## 2022-06-29 DIAGNOSIS — L814 Other melanin hyperpigmentation: Secondary | ICD-10-CM | POA: Diagnosis not present

## 2022-06-29 DIAGNOSIS — D224 Melanocytic nevi of scalp and neck: Secondary | ICD-10-CM | POA: Diagnosis not present

## 2022-06-29 DIAGNOSIS — B351 Tinea unguium: Secondary | ICD-10-CM | POA: Diagnosis not present

## 2022-06-29 DIAGNOSIS — Z85828 Personal history of other malignant neoplasm of skin: Secondary | ICD-10-CM | POA: Diagnosis not present

## 2022-06-29 DIAGNOSIS — L821 Other seborrheic keratosis: Secondary | ICD-10-CM | POA: Diagnosis not present

## 2022-06-29 DIAGNOSIS — L570 Actinic keratosis: Secondary | ICD-10-CM | POA: Diagnosis not present

## 2022-07-09 ENCOUNTER — Ambulatory Visit: Payer: Medicare HMO | Admitting: Neurology

## 2022-07-09 ENCOUNTER — Encounter: Payer: Self-pay | Admitting: Neurology

## 2022-07-09 VITALS — BP 122/72 | HR 67 | Ht 67.0 in | Wt 161.8 lb

## 2022-07-09 DIAGNOSIS — F4310 Post-traumatic stress disorder, unspecified: Secondary | ICD-10-CM

## 2022-07-09 DIAGNOSIS — G4753 Recurrent isolated sleep paralysis: Secondary | ICD-10-CM

## 2022-07-09 DIAGNOSIS — G4719 Other hypersomnia: Secondary | ICD-10-CM

## 2022-07-09 DIAGNOSIS — S060XAD Concussion with loss of consciousness status unknown, subsequent encounter: Secondary | ICD-10-CM

## 2022-07-09 DIAGNOSIS — R0689 Other abnormalities of breathing: Secondary | ICD-10-CM

## 2022-07-09 DIAGNOSIS — G3184 Mild cognitive impairment, so stated: Secondary | ICD-10-CM

## 2022-07-09 DIAGNOSIS — R69 Illness, unspecified: Secondary | ICD-10-CM | POA: Diagnosis not present

## 2022-07-09 NOTE — Patient Instructions (Signed)
1) excessive daytime sleepiness with vivid dreams , isolated cataplexy , dreams are most vivid in morning -  can be realistic. Sleep paralysis. Double HLA positive for narcolepsy.  Normal PSG - I would want to follow up with an MSLT . No meds to wean off from.     2)  subjective memory loss. Her MOCA was not indicative a of short term memory loss, she reports racing thoughts, distraction, but could do complicated trail making test. Pseudo dementia or postconcussion symptom.   3)  mild TBI.  Concussion by running into a monument on the [place], facial injury and headaches were worse for a while. Now resolved. IMPRESSION:CT.  No acute intracranial abnormality. Mild chronic small vessel ischemic disease. Was unchanged in comparison to 2022 study.     PLAN : 1) Invite for PSG and MSLT.                            2) memory changes seem related more likely to low attention / distractibility and anxiety. I will             for neuropsychological testing. She has a history of dementia in both parents.                 3)  ATN blood test- concern about Alzheimer's burden.    RV in 6 months with NP or me.
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
## 2022-07-13 DIAGNOSIS — Z01419 Encounter for gynecological examination (general) (routine) without abnormal findings: Secondary | ICD-10-CM | POA: Diagnosis not present

## 2022-07-15 DIAGNOSIS — M47816 Spondylosis without myelopathy or radiculopathy, lumbar region: Secondary | ICD-10-CM | POA: Diagnosis not present

## 2022-07-17 LAB — ATN PROFILE
A -- Beta-amyloid 42/40 Ratio: 0.097 — ABNORMAL LOW (ref >0.102)
Beta-amyloid 40: 187.54 pg/mL
Beta-amyloid 42: 18.25 pg/mL
N -- NfL, Plasma: 2.12 pg/mL (ref 0.00–7.64)
T -- p-tau181: 1.04 pg/mL — ABNORMAL HIGH (ref 0.00–0.97)

## 2022-07-18 NOTE — Addendum Note (Signed)
Addended by: Larey Seat on: 07/18/2022 01:21 PM   Modules accepted: Orders

## 2022-07-18 NOTE — Addendum Note (Signed)
Addended by: Larey Seat on: 07/18/2022 02:08 PM   Modules accepted: Level of Service

## 2022-07-23 NOTE — Progress Notes (Signed)
Cardiology Office Note:    Date:  07/24/2022   ID:  Donna [name], DOB 23-Oct-1950, MRN [id number]  PCP:  [name], [place] Prov[facility] Cardiologist:  Jenkins Rouge, MD     Referring MD: [name], DO   Chief Complaint: shortness of breath  History of Present Illness:    Donna [name] is a very pleasant 72 y.o. female with a hx of HLD, HTN, former alcohol abuse, essential tremor, atypical chest pain, mild MR, mild AR, mild aortic root dilatation,   Initially seen by Dr. Johnsie Cancel in 2014 for atypical chest pain.  She had normal ECG and normal nuclear study with no infarct or ischemia, EF 57%.  Previous EtOH abuse, sober since 2017, treated with Inderal for withdrawal.  History of noncompliance with medication.  Seen by Dr. Theda Sers, neurology, 08/22/2018 for her essential tremor to r/o Parkinson's.  There is a family history of tremor, mostly affects left arm.  ABIs done 11/15/2018 with no signs of PVD.  Myoview 08/31/2019, normal, EF 59%.  Echo 09/11/2020 with EF 55 to 60%, mild MR, mild AR, trileaflet valve, aortic root 4.0 cm.  She sees Dr. Brett Fairy for sleep issues.  Last cardiology clinic visit was 04/20/22 with Dr. Johnsie Cancel at which time LDL was 109, she had pending calcium score ordered by PCP. History of dyspnea felt to be stable. Has some asthma, former smoker, followed by pulmonology. She was advised to follow-up in one year.   CT calcium score 05/06/22 was 50.3, 59% for age/sex/race. 48.6 LAD, LCx 1.68. Dr. Johnsie Cancel advised her to work on lowering cholesterol, LDL goal < 70.  Today, she is here for evaluation of shortness of breath.  Reports she called the office approximately 1 week ago to report increased SOB with walking around her neighborhood. Around the same time, she also had some episodes of mid-sternal chest pain that concerned her.  She also noted at that time that she had pitting edema in her right ankle.  Generally walks 15-30 minutes daily, has a few hills to  navigate on her walk and feels that it is more difficult in the afternoons than in the mornings. Cooks at home, but admits to adding a lot of sea salt to her food. Is trying to cut back. Has a history of asthma and is working with pulmonology on medication management.  Feels like she has swelling in her abdomen, no early satiety, no orthopnea or PND. Has been less active over the past few months and is generally feeling tired - mom passed away, back problems, ankle injury. Continues to do yoga almost daily and goes to [employer] to work with the machines. Pulmonologist wants her to switch from propranolol to metoprolol which she takes only as needed due to asthma.  She denies weakness, presyncope, syncope.  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   b complex vitamins tablet Take 1 tablet by mouth daily.     budesonide-formoterol (SYMBICORT) 80-4.5 MCG/ACT inhaler Inhale 2 puffs into the lungs every 6 (six) hours as needed.   calcium gluconate 500 MG tablet Take 500 mg by mouth daily.   estradiol (ESTRACE) 0.1 MG/GM vaginal cream SMARTSIG:1 Applicator Vaginal Once a Week    lisinopril (ZESTRIL) 5 MG tablet TAKE 1 TABLET BY MOUTH DAILY FOR ELEVATED BP WHILE TRAVELING   Loratadine (CLARITIN PO) Take by mouth.   Magnesium 500 MG TABS Take 1 tablet by mouth daily.     metoprolol tartrate (LOPRESSOR) 50 MG tablet Take one (1) tablet by mouth ( 50 mg) 2 hours prior to CT Scan.   Multiple Vitamin (MULTIVITAMIN) capsule Take 1 capsule by mouth daily.     propranolol (INDERAL) 10 MG tablet Take 10 mg by mouth as needed.   rosuvastatin (CRESTOR) 20 MG tablet Take 1 tablet (20 mg total) by mouth daily.   valACYclovir (VALTREX) 500 MG tablet ONE BY MOUTH TWICE A DAY X 5 DAYS FOR HERPES OUTBREAK   Current Facility-Administered Medications for the 07/24/22 encounter (Office Visit) with Emmaline Life, NP  Medication   0.9 %  sodium chloride infusion     Allergies:   Ciprofloxacin, Doxycycline, Gluten meal, Incruse ellipta [umeclidinium bromide], Penicillins, and Quinolones   Social History   Socioeconomic History   Marital status: Married    Spouse name: [name]   Number of children: 0   Years of education: Not on file   Highest education level: Associate degree: academic program  Occupational History   Occupation: [employer]  Tobacco Use   Smoking status: Former    Years: 30    Types: Cigarettes    Quit date: 10/15/1996    Years since quitting: 25.7   Smokeless tobacco: Never  Vaping Use   Vaping Use: Never used  Substance and Sexual Activity   Alcohol use: No   Drug use: No   Sexual activity: Yes  Other Topics Concern   Not on file  Social History Narrative   Lives with husband   Right handed   1-2 caffeine drinks daily   Social Determinants of Health   Financial Resource Strain: Not on file  Food Insecurity: Not on file  Transportation Needs: Not on file  Physical Activity: Not on file  Stress: Not on file  Social Connections: Not on file     Family History: The patient's family history includes Breast cancer in her maternal grandmother;  Colon cancer in her paternal aunt and paternal grandmother; Colon cancer (age of onset: 45) in her father; Colon polyps in her paternal aunt; Dementia in her father, maternal aunt, maternal uncle, and mother; Diabetes in her mother; Healthy in her brother and sister; Heart attack in her father; Heart disease in her father; Melanoma in her father; Pancreatic cancer in her paternal aunt; Rectal cancer in her paternal aunt; Rheum arthritis in her father. There is no history of Stomach cancer, Esophageal cancer, or Liver cancer.  ROS:   Please see the history of present  illness.    + DOE + chest discomfort + RLE edema All other systems reviewed and are negative.  Labs/Other Studies Reviewed:    The following studies were reviewed today:  Cardiac Studies & Procedures     STRESS TESTS  MYOCARDIAL PERFUSION IMAGING 08/31/2019  Narrative  The left ventricular ejection fraction is normal (55-65%).  Nuclear stress EF: 59%.  There was no ST segment deviation noted during stress.  The study is normal.  This is a low risk study.  Normal stress nuclear study with no ischemia or infarction.  Gated ejection fraction 59% with normal wall motion.   ECHOCARDIOGRAM  ECHOCARDIOGRAM COMPLETE 10/08/2021   IMPRESSIONS   1. Left ventricular ejection fraction, by estimation, is 60 to 65%. The left ventricle has normal function. The left ventricle has no regional wall motion abnormalities. There is moderate asymmetric left ventricular hypertrophy of the basal-septal segment. Left ventricular diastolic parameters are indeterminate. 2. Right ventricular systolic function is normal. The right ventricular size is normal. There is normal pulmonary artery systolic pressure. The estimated right ventricular systolic pressure is 123456 mmHg. 3. Left atrial size was severely dilated. 4. The mitral valve is normal in structure. Trivial mitral valve regurgitation. No evidence of mitral stenosis. 5. The aortic  valve is tricuspid. Aortic valve regurgitation is trivial. No aortic stenosis is present. 6. Aortic dilatation noted. There is dilatation of the ascending aorta, measuring 40 mm. 7. The inferior vena cava is normal in size with greater than 50% respiratory variability, suggesting right atrial pressure of 3 mmHg.  FINDINGS Left Ventricle: Left ventricular ejection fraction, by estimation, is 60 to 65%. The left ventricle has normal function. The left ventricle has no regional wall motion abnormalities. 3D left ventricular ejection fraction analysis performed but not reported based on interpreter judgement due to suboptimal tracking. The left ventricular internal cavity size was normal in size. There is moderate asymmetric left ventricular hypertrophy of the basal-septal segment. Left ventricular diastolic parameters are indeterminate.  Right Ventricle: The right ventricular size is normal. No increase in right ventricular wall thickness. Right ventricular systolic function is normal. There is normal pulmonary artery systolic pressure. The tricuspid regurgitant velocity is 2.13 m/s, and with an assumed right atrial pressure of 3 mmHg, the estimated right ventricular systolic pressure is 123456 mmHg.  Left Atrium: Left atrial size was severely dilated.  Right Atrium: Right atrial size was normal in size.  Pericardium: There is no evidence of pericardial effusion.  Mitral Valve: The mitral valve is normal in structure. Trivial mitral valve regurgitation. No evidence of mitral valve stenosis.  Tricuspid Valve: The tricuspid valve is normal in structure. Tricuspid valve regurgitation is trivial.  Aortic Valve: The aortic valve is tricuspid. Aortic valve regurgitation is trivial. Aortic regurgitation PHT measures 594 msec. No aortic stenosis is present.  Pulmonic Valve: The pulmonic valve was normal in structure. Pulmonic valve regurgitation is mild.  Aorta: Aortic dilatation noted. There is  dilatation of the ascending aorta, measuring 40 mm.  Venous: The inferior vena cava is normal in size with greater than 50% respiratory variability, suggesting right atrial pressure of 3 mmHg.  IAS/Shunts: The interatrial septum was not well visualized.       CT SCANS  CT CARDIAC SCORING (SELF PAY ONLY) 05/06/2022  Addendum 05/06/2022 10:29 AM ADDENDUM REPORT: 05/06/2022 10:27  ADDENDUM: The following report is an over-read performed by radiologist Dr. Dahlia Bailiff of [facility] on May 06, 2022. This over-read does not include interpretation of cardiac  or coronary anatomy or pathology. The coronary calcium score interpretation by the cardiologist is attached.  COMPARISON:  CT July 11, 2015.  FINDINGS: Vascular: Aortic atherosclerosis.  Mediastinum/Nodes: In the visualized portions of the chest there are no pathologically enlarged mediastinal, or hilar lymph nodes, noting limited sensitivity for the detection of hilar adenopathy on this noncontrast study. Visualized portions of the esophagus are grossly unremarkable.  Lungs/Pleura: Partially calcified 3 mm left lower lobe pulmonary nodule on image 16/2.  Upper Abdomen: Visualized portions of the upper abdomen are unremarkable.  Musculoskeletal: There are no aggressive appearing lytic or blastic lesions noted in the visualized portions of the skeleton.  IMPRESSION: 1. 3 mm left lower lobe pulmonary nodule, partially calcified, likely a granuloma. No follow-up needed if patient is low-risk.This recommendation follows the consensus statement: Guidelines for Management of Incidental Pulmonary Nodules Detected on CT Images: From the Fleischner Society 2017; Radiology 2017; 284:228-243. 2. Aortic Atherosclerosis (ICD10-I70.0).   Electronically Signed By: Dahlia Bailiff M.D. On: 05/06/2022 10:27  Narrative : Cardiovascular Disease Risk stratification  EXAM: Coronary Calcium  Score  TECHNIQUE: A gated, non-contrast computed tomography scan of the heart was performed using 87mm slice thickness. Axial images were analyzed on a dedicated workstation. Calcium scoring of the coronary arteries was performed using the Agatston method.  FINDINGS: Coronary arteries: Normal origins.  Coronary Calcium Score:  Left main: 0  Left anterior descending artery: 48.6  Left circumflex artery: 1.68  Right coronary artery: 0  Total: 50.3  Percentile: 59  Pericardium: Normal.  Ascending Aorta: Normal caliber.  Non-cardiac: See separate report from [facility].  IMPRESSION: Coronary calcium score of 50.3. This was 40 percentile for age-, race-, and sex-matched controls.            Recent Labs: No results found for requested labs within last 365 days.  Recent Lipid Panel    Component Value Date/Time   CHOL 220 (H) 07/02/2020 0924   TRIG 37 07/02/2020 0924   HDL 100 07/02/2020 0924   CHOLHDL 2.2 07/02/2020 0924   VLDL 14 06/18/2016 1033   LDLCALC 109 (H) 07/02/2020 0924     Risk Assessment/Calculations:      Physical Exam:    VS:  BP 128/68   Pulse 65   Ht 5\' 7"  (1.702 m)   Wt 161 lb 6.4 oz (73.2 kg)   SpO2 97%   BMI 25.28 kg/m     Wt Readings from Last 3 Encounters:  07/24/22 161 lb 6.4 oz (73.2 kg)  07/09/22 161 lb 12.8 oz (73.4 kg)  05/27/22 157 lb (71.2 kg)     GEN:  Well nourished, well developed in no acute distress HEENT: Normal NECK: No JVD; No carotid bruits CARDIAC: RRR, no murmurs, rubs, gallops RESPIRATORY:  Clear to auscultation without rales, wheezing or rhonchi  ABDOMEN: Soft, non-tender, non-distended MUSCULOSKELETAL:  No edema; No deformity. 2+ pedal pulses, equal bilaterally SKIN: Warm and dry NEUROLOGIC:  Alert and oriented x 3 PSYCHIATRIC:  Normal affect   EKG:  EKG is ordered today.  The ekg ordered today demonstrates normal sinus rhythm at 65 bpm, no ST abnormality  Diagnoses:    1. Precordial  pain   2. Dyspnea on exertion   3. Coronary artery disease of native artery of native heart with stable angina pectoris ([place])   4. Hyperlipidemia LDL goal <70   5. Elevated blood pressure reading    Assessment and Plan:     CAD with angina: Coronary calcium score of 50.3  on CT cardiac score 05/06/2022. She is having symptoms of chest discomfort and DOE that she feels have worsened over the past 2 months. Risk factors include age, hyperlipidemia, former tobacco abuse. We will get coronary CTA for mapping of coronary anatomy. Will have her take Lopressor 50 mg 2 hours prior. Need to get LDL < 70.   Hyperlipidemia LDL goal < 70: LDL 109 on 07/02/2020. Brief discussion about the impact of diet and exercise. Encouraged her to start rosuvastatin 20 mg daily. Will recheck lipid panel 2-3 months after starting.   Hypertension: BP is well controlled today. Reports she only takes lisinopril when she travels to higher altitude. Admits to occasional home BP readings > 140/80.  Encouraged her to monitor at home over the next 2 weeks and report back to [place] if consistently > 140 or > 80. Would favor daily lisinopril if BP remains elevated.   Ascending aortic aneurysm: Mild aortic dilatation at 40 mm on echo 09/2021. We will assess on upcoming CT.   Disposition: Plan follow-up based on test results  Medication Adjustments/Labs and Tests Ordered: Current medicines are reviewed at length with the patient today.  Concerns regarding medicines are outlined above.  Orders Placed This Encounter  Procedures   CT CORONARY MORPH W/CTA COR W/SCORE W/CA W/CM &/OR WO/CM   Basic Metabolic Panel (BMET)   Lipid panel   Comp Met (CMET)   EKG 12-Lead   Meds ordered this encounter  Medications   metoprolol tartrate (LOPRESSOR) 50 MG tablet    Sig: Take one (1) tablet by mouth ( 50 mg) 2 hours prior to CT Scan.    Dispense:  1 tablet    Refill:  0   rosuvastatin (CRESTOR) 20 MG tablet    Sig: Take 1 tablet (20 mg  total) by mouth daily.    Dispense:  90 tablet    Refill:  3    Order Specific Question:   Supervising Provider    Answer:   Thayer Headings [phone]    Patient Instructions  Medication Instructions:   Your physician recommends that you continue on your current medications as directed. Please refer to the Current Medication list given to you today.   *If you need a refill on your cardiac medications before your next appointment, please call your pharmacy*   Lab Work:  TODAY!!!!! BMET   If you have labs (blood work) drawn today and your tests are completely normal, you will receive your results only by: [place] (if you have MyChart) OR A paper copy in the mail If you have any lab test that is abnormal or we need to change your treatment, we will call you to review the results.   Testing/Procedures:    Your cardiac CT will be scheduled at one of the below locations:   [facility] [address] [place], [place] [address] [phone]  If scheduled at [facility], please arrive at the [facility] and Children's Entrance (Entrance C2) of [facility] 30 minutes prior to test start time. You can use the FREE valet parking offered at entrance C (encouraged to control the heart rate for the test)  Proceed to the [facility] Radiology Department (first floor) to check-in and test prep.  All radiology patients and guests should use entrance C2 at [facility], accessed from [facility], even though the hospital's physical address listed is [address].    Please follow these instructions carefully (unless otherwise  directed):  On the Night Before the Test: Be sure to Drink plenty of water. Do not consume any caffeinated/decaffeinated beverages or chocolate 12 hours prior to your test. Do not take any antihistamines 12 hours prior to your test.  On the Day of the Test: Drink plenty of water until 1 hour prior to  the test. Do not eat any food 1 hour prior to test. You may take your regular medications prior to the test.  Take metoprolol (Lopressor) ONE TABLET (50 MG ) two hours prior to test. FEMALES- please wear underwire-free bra if available, avoid dresses & tight clothing       After the Test: Drink plenty of water. After receiving IV contrast, you may experience a mild flushed feeling. This is normal. On occasion, you may experience a mild rash up to 24 hours after the test. This is not dangerous. If this occurs, you can take Benadryl 25 mg and increase your fluid intake. If you experience trouble breathing, this can be serious. If it is severe call 911 IMMEDIATELY. If it is mild, please call our office. We will call to schedule your test 2-4 weeks out understanding that some insurance companies will need an authorization prior to the service being performed.   For non-scheduling related questions, please contact the cardiac imaging nurse navigator should you have any questions/concerns: Marchia Bond, Cardiac Imaging Nurse Navigator Gordy Clement, Cardiac Imaging Nurse Navigator [facility] Direct Office Dial: [phone]   For scheduling needs, including cancellations and rescheduling, please call [place], [phone].    Follow-Up: At [facility], you and your health needs are our priority.  As part of our continuing mission to provide you with exceptional heart care, we have created designated Provider Care Teams.  These Care Teams include your primary Cardiologist (physician) and Advanced Practice Providers (APPs -  Physician Assistants and Nurse Practitioners) who all work together to provide you with the care you need, when you need it.  We recommend signing up for the patient portal called "MyChart".  Sign up information is provided on this After Visit Summary.  MyChart is used to connect with patients for Virtual Visits (Telemedicine).  Patients  are able to view lab/test results, encounter notes, upcoming appointments, etc.  Non-urgent messages can be sent to your provider as well.   To learn more about what you can do with MyChart, go to [email or web address].    Your next appointment:   9 month(s)  Provider:   Jenkins Rouge, MD     Other Instructions  Your physician wants you to follow-up in: 9 month with Dr. Johnsie Cancel.  You will receive a reminder letter in the mail two months in advance. If you don't receive a letter, please call our office to schedule the follow-up appointment.   HOW TO TAKE YOUR BLOOD PRESSURE  Rest 5 minutes before taking your blood pressure. Don't  smoke or drink caffeinated beverages for at least 30 minutes before. Take your blood pressure before (not after) you eat. Sit comfortably with your back supported and both feet on the floor ( don't cross your legs). Elevate your arm to heart level on a table or a desk. Use the proper sized cuff.  It should fit smoothly and snugly around your bare upper arm.  There should be  Enough room to slip a fingertip under the cuff.  The bottom edge of the cuff should be 1 inch above the crease Of the elbow. Please monitor your blood  pressure once daily 2 hours after your am medication. If you blood pressure Consistently remains above [id number] (systolic) top number or over 80 ( diastolic) bottom number X 3 days  Consecutively.  Please call our office at [phone] or send Mychart message.     ----Avoid cold medicines with D or DM at the end of them----  Mediterranean Diet A Mediterranean diet refers to food and lifestyle choices that are based on the traditions of countries located on the [employer]. It focuses on eating more fruits, vegetables, whole grains, beans, nuts, seeds, and heart-healthy fats, and eating less dairy, meat, eggs, and processed foods with added sugar, salt, and fat. This way of eating has been shown to help prevent certain conditions and  improve outcomes for people who have chronic diseases, like kidney disease and heart disease. What are tips for following this plan? Reading food labels Check the serving size of packaged foods. For foods such as rice and pasta, the serving size refers to the amount of cooked product, not dry. Check the total fat in packaged foods. Avoid foods that have saturated fat or trans fats. Check the ingredient list for added sugars, such as corn syrup. Shopping  Buy a variety of foods that offer a balanced diet, including: Fresh fruits and vegetables (produce). Grains, beans, nuts, and seeds. Some of these may be available in unpackaged forms or large amounts (in bulk). Fresh seafood. Poultry and eggs. Low-fat dairy products. Buy whole ingredients instead of prepackaged foods. Buy fresh fruits and vegetables in-season from local farmers markets. Buy plain frozen fruits and vegetables. If you do not have access to quality fresh seafood, buy precooked frozen shrimp or canned fish, such as tuna, salmon, or sardines. Stock your pantry so you always have certain foods on hand, such as olive oil, canned tuna, canned tomatoes, rice, pasta, and beans. Cooking Cook foods with extra-virgin olive oil instead of using butter or other vegetable oils. Have meat as a side dish, and have vegetables or grains as your main dish. This means having meat in small portions or adding small amounts of meat to foods like pasta or stew. Use beans or vegetables instead of meat in common dishes like chili or lasagna. Experiment with different cooking methods. Try roasting, broiling, steaming, and sauting vegetables. Add frozen vegetables to soups, stews, pasta, or rice. Add nuts or seeds for added healthy fats and plant protein at each meal. You can add these to yogurt, salads, or vegetable dishes. Marinate fish or vegetables using olive oil, lemon juice, garlic, and fresh herbs. Meal planning Plan to eat one vegetarian  meal one day each week. Try to work up to two vegetarian meals, if possible. Eat seafood two or more times a week. Have healthy snacks readily available, such as: Vegetable sticks with hummus. Greek yogurt. Fruit and nut trail mix. Eat balanced meals throughout the week. This includes: Fruit: 2-3 servings a day. Vegetables: 4-5 servings a day. Low-fat dairy: 2 servings a day. Fish, poultry, or lean meat: 1 serving a day. Beans and legumes: 2 or more servings a week. Nuts and seeds: 1-2 servings a day. Whole grains: 6-8 servings a day. Extra-virgin olive oil: 3-4 servings a day. Limit red meat and sweets to only a few servings a month. Lifestyle  Cook and eat meals together with your family, when possible. Drink enough fluid to keep your urine pale yellow. Be physically active every day. This includes: Aerobic exercise like running or swimming. Leisure activities like  gardening, walking, or housework. Get 7-8 hours of sleep each night. If recommended by your health care provider, drink red wine in moderation. This means 1 glass a day for nonpregnant women and 2 glasses a day for men. A glass of wine equals 5 oz (150 mL). What foods should I eat? Fruits Apples. Apricots. Avocado. Berries. Bananas. Cherries. Dates. Figs. Grapes. Lemons. Melon. Oranges. Peaches. Plums. Pomegranate. Vegetables Artichokes. Beets. Broccoli. Cabbage. Carrots. Eggplant. Green beans. Chard. Kale. Spinach. Onions. Leeks. Peas. Squash. Tomatoes. Peppers. Radishes. Grains Whole-grain pasta. Brown rice. Bulgur wheat. Polenta. Couscous. Whole-wheat bread. [place]. Meats and other proteins Beans. Almonds. Sunflower seeds. Pine nuts. Peanuts. [place]. Salmon. Scallops. Shrimp. [place]. Tilapia. Clams. Oysters. Eggs. Poultry without skin. Dairy Low-fat milk. Cheese. Greek yogurt. Fats and oils Extra-virgin olive oil. Avocado oil. Grapeseed oil. Beverages Water. Red wine. Herbal tea. Sweets and desserts Greek  yogurt with honey. Baked apples. Poached pears. Trail mix. Seasonings and condiments Basil. Cilantro. Coriander. Cumin. Mint. Parsley. Sage. Rosemary. Tarragon. Garlic. Oregano. Thyme. Pepper. Balsamic vinegar. Tahini. Hummus. Tomato sauce. Olives. Mushrooms. The items listed above may not be a complete list of foods and beverages you can eat. Contact a dietitian for more information. What foods should I limit? This is a list of foods that should be eaten rarely or only on special occasions. Fruits Fruit canned in syrup. Vegetables Deep-fried potatoes (french fries). Grains Prepackaged pasta or rice dishes. Prepackaged cereal with added sugar. Prepackaged snacks with added sugar. Meats and other proteins Beef. Pork. Lamb. Poultry with skin. Hot dogs. Berniece Salines. Dairy Ice cream. Sour cream. Whole milk. Fats and oils Butter. Canola oil. Vegetable oil. Beef fat (tallow). Lard. Beverages Juice. Sugar-sweetened soft drinks. Beer. Liquor and spirits. Sweets and desserts Cookies. Cakes. Pies. Candy. Seasonings and condiments Mayonnaise. Pre-made sauces and marinades. The items listed above may not be a complete list of foods and beverages you should limit. Contact a dietitian for more information. Summary The Mediterranean diet includes both food and lifestyle choices. Eat a variety of fresh fruits and vegetables, beans, nuts, seeds, and whole grains. Limit the amount of red meat and sweets that you eat. If recommended by your health care provider, drink red wine in moderation. This means 1 glass a day for nonpregnant women and 2 glasses a day for men. A glass of wine equals 5 oz (150 mL). This information is not intended to replace advice given to you by your health care provider. Make sure you discuss any questions you have with your health care provider. Document Revised: 06/02/2019 Document Reviewed: 03/30/2019 Elsevier Patient Education  2023 [place], Emmaline Life, [place]  07/24/2022 2:05 PM    [place]

## 2022-07-24 ENCOUNTER — Encounter: Payer: Self-pay | Admitting: Nurse Practitioner

## 2022-07-24 ENCOUNTER — Ambulatory Visit: Payer: Medicare HMO | Attending: Nurse Practitioner | Admitting: Nurse Practitioner

## 2022-07-24 VITALS — BP 128/68 | HR 65 | Ht 67.0 in | Wt 161.4 lb

## 2022-07-24 DIAGNOSIS — R030 Elevated blood-pressure reading, without diagnosis of hypertension: Secondary | ICD-10-CM | POA: Diagnosis not present

## 2022-07-24 DIAGNOSIS — I25118 Atherosclerotic heart disease of native coronary artery with other forms of angina pectoris: Secondary | ICD-10-CM

## 2022-07-24 DIAGNOSIS — E785 Hyperlipidemia, unspecified: Secondary | ICD-10-CM | POA: Diagnosis not present

## 2022-07-24 DIAGNOSIS — R0609 Other forms of dyspnea: Secondary | ICD-10-CM

## 2022-07-24 DIAGNOSIS — R072 Precordial pain: Secondary | ICD-10-CM

## 2022-07-24 DIAGNOSIS — R0602 Shortness of breath: Secondary | ICD-10-CM | POA: Diagnosis not present

## 2022-07-24 MED ORDER — METOPROLOL TARTRATE 50 MG PO TABS: ORAL_TABLET | ORAL | 0 refills | Status: DC

## 2022-07-24 MED ORDER — ROSUVASTATIN CALCIUM 20 MG PO TABS: 20.0000 mg | ORAL_TABLET | Freq: Every day | ORAL | 3 refills | Status: DC

## 2022-07-24 NOTE — Patient Instructions (Signed)
Medication Instructions:   Your physician recommends that you continue on your current medications as directed. Please refer to the Current Medication list given to you today.   *If you need a refill on your cardiac medications before your next appointment, please call your pharmacy*   Lab Work:  TODAY!!!!! BMET   If you have labs (blood work) drawn today and your tests are completely normal, you will receive your results only by: [place] (if you have MyChart) OR A paper copy in the mail If you have any lab test that is abnormal or we need to change your treatment, we will call you to review the results.   Testing/Procedures:    Your cardiac CT will be scheduled at one of the below locations:   [facility] [address] [place],  [address] [phone]  If scheduled at [facility], please arrive at the [facility] and Children's Entrance (Entrance C2) of [facility] 30 minutes prior to test start time. You can use the FREE valet parking offered at entrance C (encouraged to control the heart rate for the test)  Proceed to the [facility] Radiology Department (first floor) to check-in and test prep.  All radiology patients and guests should use entrance C2 at [facility], accessed from [facility], even though the hospital's physical address listed is [address].    Please follow these instructions carefully (unless otherwise directed):  On the Night Before the Test: Be sure to Drink plenty of water. Do not consume any caffeinated/decaffeinated beverages or chocolate 12 hours prior to your test. Do not take any antihistamines 12 hours prior to your test.  On the Day of the Test: Drink plenty of water until 1 hour prior to the test. Do not eat any food 1 hour prior to test. You may take your regular medications prior to the test.  Take metoprolol (Lopressor) ONE TABLET (50 MG ) two hours prior to  test. FEMALES- please wear underwire-free bra if available, avoid dresses & tight clothing       After the Test: Drink plenty of water. After receiving IV contrast, you may experience a mild flushed feeling. This is normal. On occasion, you may experience a mild rash up to 24 hours after the test. This is not dangerous. If this occurs, you can take Benadryl 25 mg and increase your fluid intake. If you experience trouble breathing, this can be serious. If it is severe call 911 IMMEDIATELY. If it is mild, please call our office. We will call to schedule your test 2-4 weeks out understanding that some insurance companies will need an authorization prior to the service being performed.   For non-scheduling related questions, please contact the cardiac imaging nurse navigator should you have any questions/concerns: Marchia Bond, Cardiac Imaging Nurse Navigator Gordy Clement, Cardiac Imaging Nurse Navigator [facility] Direct Office Dial: [phone]   For scheduling needs, including cancellations and rescheduling, please call [place], [phone].    Follow-Up: At [facility], you and your health needs are our priority.  As part of our continuing mission to provide you with exceptional heart care, we have created designated Provider Care Teams.  These Care Teams include your primary Cardiologist (physician) and Advanced Practice Providers (APPs -  Physician Assistants and Nurse Practitioners) who all work together to provide you with the care you need, when you need it.  We recommend signing up for the patient portal called "  MyChart".  Sign up information is provided on this After Visit Summary.  MyChart is used to connect with patients for Virtual Visits (Telemedicine).  Patients are able to view lab/test results, encounter notes, upcoming appointments, etc.  Non-urgent messages can be sent to your provider as well.   To learn more about what you can do  with MyChart, go to [email or web address].    Your next appointment:   9 month(s)  Provider:   Jenkins Rouge, MD     Other Instructions  Your physician wants you to follow-up in: 9 month with Dr. Johnsie Cancel.  You will receive a reminder letter in the mail two months in advance. If you don't receive a letter, please call our office to schedule the follow-up appointment.   HOW TO TAKE YOUR BLOOD PRESSURE  Rest 5 minutes before taking your blood pressure. Don't  smoke or drink caffeinated beverages for at least 30 minutes before. Take your blood pressure before (not after) you eat. Sit comfortably with your back supported and both feet on the floor ( don't cross your legs). Elevate your arm to heart level on a table or a desk. Use the proper sized cuff.  It should fit smoothly and snugly around your bare upper arm.  There should be  Enough room to slip a fingertip under the cuff.  The bottom edge of the cuff should be 1 inch above the crease Of the elbow. Please monitor your blood pressure once daily 2 hours after your am medication. If you blood pressure Consistently remains above [id number] (systolic) top number or over 80 ( diastolic) bottom number X 3 days  Consecutively.  Please call our office at [phone] or send Mychart message.     ----Avoid cold medicines with D or DM at the end of them----  Mediterranean Diet A Mediterranean diet refers to food and lifestyle choices that are based on the traditions of countries located on the [employer]. It focuses on eating more fruits, vegetables, whole grains, beans, nuts, seeds, and heart-healthy fats, and eating less dairy, meat, eggs, and processed foods with added sugar, salt, and fat. This way of eating has been shown to help prevent certain conditions and improve outcomes for people who have chronic diseases, like kidney disease and heart disease. What are tips for following this plan? Reading food labels Check the serving size of  packaged foods. For foods such as rice and pasta, the serving size refers to the amount of cooked product, not dry. Check the total fat in packaged foods. Avoid foods that have saturated fat or trans fats. Check the ingredient list for added sugars, such as corn syrup. Shopping  Buy a variety of foods that offer a balanced diet, including: Fresh fruits and vegetables (produce). Grains, beans, nuts, and seeds. Some of these may be available in unpackaged forms or large amounts (in bulk). Fresh seafood. Poultry and eggs. Low-fat dairy products. Buy whole ingredients instead of prepackaged foods. Buy fresh fruits and vegetables in-season from local farmers markets. Buy plain frozen fruits and vegetables. If you do not have access to quality fresh seafood, buy precooked frozen shrimp or canned fish, such as tuna, salmon, or sardines. Stock your pantry so you always have certain foods on hand, such as olive oil, canned tuna, canned tomatoes, rice, pasta, and beans. Cooking Cook foods with extra-virgin olive oil instead of using butter or other vegetable oils. Have meat as a side dish, and have vegetables or grains as your main dish. This means  having meat in small portions or adding small amounts of meat to foods like pasta or stew. Use beans or vegetables instead of meat in common dishes like chili or lasagna. Experiment with different cooking methods. Try roasting, broiling, steaming, and sauting vegetables. Add frozen vegetables to soups, stews, pasta, or rice. Add nuts or seeds for added healthy fats and plant protein at each meal. You can add these to yogurt, salads, or vegetable dishes. Marinate fish or vegetables using olive oil, lemon juice, garlic, and fresh herbs. Meal planning Plan to eat one vegetarian meal one day each week. Try to work up to two vegetarian meals, if possible. Eat seafood two or more times a week. Have healthy snacks readily available, such as: Vegetable sticks  with hummus. Greek yogurt. Fruit and nut trail mix. Eat balanced meals throughout the week. This includes: Fruit: 2-3 servings a day. Vegetables: 4-5 servings a day. Low-fat dairy: 2 servings a day. Fish, poultry, or lean meat: 1 serving a day. Beans and legumes: 2 or more servings a week. Nuts and seeds: 1-2 servings a day. Whole grains: 6-8 servings a day. Extra-virgin olive oil: 3-4 servings a day. Limit red meat and sweets to only a few servings a month. Lifestyle  Cook and eat meals together with your family, when possible. Drink enough fluid to keep your urine pale yellow. Be physically active every day. This includes: Aerobic exercise like running or swimming. Leisure activities like gardening, walking, or housework. Get 7-8 hours of sleep each night. If recommended by your health care provider, drink red wine in moderation. This means 1 glass a day for nonpregnant women and 2 glasses a day for men. A glass of wine equals 5 oz (150 mL). What foods should I eat? Fruits Apples. Apricots. Avocado. Berries. Bananas. Cherries. Dates. Figs. Grapes. Lemons. Melon. Oranges. Peaches. Plums. Pomegranate. Vegetables Artichokes. Beets. Broccoli. Cabbage. Carrots. Eggplant. Green beans. Chard. Kale. Spinach. Onions. Leeks. Peas. Squash. Tomatoes. Peppers. Radishes. Grains Whole-grain pasta. Brown rice. Bulgur wheat. Polenta. Couscous. Whole-wheat bread. [place]. Meats and other proteins Beans. Almonds. Sunflower seeds. Pine nuts. Peanuts. [place]. Salmon. Scallops. Shrimp. [place]. Tilapia. Clams. Oysters. Eggs. Poultry without skin. Dairy Low-fat milk. Cheese. Greek yogurt. Fats and oils Extra-virgin olive oil. Avocado oil. Grapeseed oil. Beverages Water. Red wine. Herbal tea. Sweets and desserts Greek yogurt with honey. Baked apples. Poached pears. Trail mix. Seasonings and condiments Basil. Cilantro. Coriander. Cumin. Mint. Parsley. Sage. Rosemary. Tarragon. Garlic. Oregano.  Thyme. Pepper. Balsamic vinegar. Tahini. Hummus. Tomato sauce. Olives. Mushrooms. The items listed above may not be a complete list of foods and beverages you can eat. Contact a dietitian for more information. What foods should I limit? This is a list of foods that should be eaten rarely or only on special occasions. Fruits Fruit canned in syrup. Vegetables Deep-fried potatoes (french fries). Grains Prepackaged pasta or rice dishes. Prepackaged cereal with added sugar. Prepackaged snacks with added sugar. Meats and other proteins Beef. Pork. Lamb. Poultry with skin. Hot dogs. Berniece Salines. Dairy Ice cream. Sour cream. Whole milk. Fats and oils Butter. Canola oil. Vegetable oil. Beef fat (tallow). Lard. Beverages Juice. Sugar-sweetened soft drinks. Beer. Liquor and spirits. Sweets and desserts Cookies. Cakes. Pies. Candy. Seasonings and condiments Mayonnaise. Pre-made sauces and marinades. The items listed above may not be a complete list of foods and beverages you should limit. Contact a dietitian for more information. Summary The Mediterranean diet includes both food and lifestyle choices. Eat a variety of fresh fruits and vegetables,  beans, nuts, seeds, and whole grains. Limit the amount of red meat and sweets that you eat. If recommended by your health care provider, drink red wine in moderation. This means 1 glass a day for nonpregnant women and 2 glasses a day for men. A glass of wine equals 5 oz (150 mL). This information is not intended to replace advice given to you by your health care provider. Make sure you discuss any questions you have with your health care provider. Document Revised: 06/02/2019 Document Reviewed: 03/30/2019 Elsevier Patient Education  [place].

## 2022-07-25 LAB — BASIC METABOLIC PANEL
BUN/Creatinine Ratio: 14 (ref 12–28)
BUN: 11 mg/dL (ref 8–27)
CO2: 24 mmol/L (ref 20–29)
Calcium: 9 mg/dL (ref 8.7–10.3)
Chloride: 100 mmol/L (ref 96–106)
Creatinine, Ser: 0.77 mg/dL (ref 0.57–1.00)
Glucose: 91 mg/dL (ref 70–99)
Potassium: 4.5 mmol/L (ref 3.5–5.2)
Sodium: 137 mmol/L (ref 134–144)
eGFR: 83 mL/min/{1.73_m2} (ref >59)

## 2022-07-27 ENCOUNTER — Telehealth: Payer: Self-pay | Admitting: Neurology

## 2022-07-27 NOTE — Telephone Encounter (Signed)
aetna medicare pending uploaded notes on the portal  

## 2022-07-28 ENCOUNTER — Telehealth: Payer: Self-pay | Admitting: Neurology

## 2022-07-28 ENCOUNTER — Encounter: Payer: Self-pay | Admitting: Psychology

## 2022-07-28 DIAGNOSIS — L570 Actinic keratosis: Secondary | ICD-10-CM | POA: Diagnosis not present

## 2022-07-28 DIAGNOSIS — G309 Alzheimer's disease, unspecified: Secondary | ICD-10-CM

## 2022-07-28 DIAGNOSIS — L820 Inflamed seborrheic keratosis: Secondary | ICD-10-CM | POA: Diagnosis not present

## 2022-07-28 NOTE — Telephone Encounter (Signed)
Pet scan is requiring a peer to peer with [employer] before 08/02/22. Please call [phone] reference [id number] to schedule a peer to peer.

## 2022-07-28 NOTE — Telephone Encounter (Signed)
Do you want to complete peer to peer? Please advise

## 2022-07-29 NOTE — Telephone Encounter (Signed)
Called the insurance and was able to schedule a peer to peer on 07/31/2022 at 58 am. Dr Owens Shark will call to complete the peer to peer with Dr Dohmeier. Provided the office number and Dr Dohmeier's cell number to ensure they can be connected. Will make sure Dr Dohmeier aware of the meeting and time scheduled

## 2022-07-29 NOTE — Telephone Encounter (Signed)
Checked status on the portal it is still pending.  

## 2022-07-30 DIAGNOSIS — R69 Illness, unspecified: Secondary | ICD-10-CM | POA: Diagnosis not present

## 2022-08-03 NOTE — Addendum Note (Signed)
Addended by: Darleen Crocker on: 08/03/2022 10:36 AM   Modules accepted: Orders

## 2022-08-03 NOTE — Telephone Encounter (Signed)
Order placed for the pt.

## 2022-08-03 NOTE — Telephone Encounter (Signed)
I received a fax from her insurance that the Pet Amyloid was denied. The reference number is [id number]. They went ahead and approved a pet metabolic stating it is more likely to show all of the details needed in order to treat the patient.  The Pet metabolic Josem Kaufmann is: [id number] exp. 07/31/22-01/27/23 for Donna [name]. If you would like her to do the pet metabolic please enter the order and I will send it to the hospital. thanks

## 2022-08-03 NOTE — Telephone Encounter (Signed)
Pet scan order sent to [facility]

## 2022-08-05 ENCOUNTER — Telehealth (HOSPITAL_COMMUNITY): Payer: Self-pay | Admitting: *Deleted

## 2022-08-05 NOTE — Telephone Encounter (Signed)
[employer] medicare auth: [id number] (exp. 07/27/22 to 01/25/23) & MSL[employer] Auth: [id number] (exp. 07/27/22 to 01/25/23)  Sent patient a mychart message.

## 2022-08-05 NOTE — Telephone Encounter (Signed)
Attempted to call patient regarding upcoming cardiac CT appointment. °Left message on voicemail with name and callback number ° °Marck Mcclenny RN Navigator Cardiac Imaging °[facility] °[phone] Office °[phone] Cell ° °

## 2022-08-06 ENCOUNTER — Ambulatory Visit (HOSPITAL_COMMUNITY)
Admission: RE | Admit: 2022-08-06 | Discharge: 2022-08-06 | Disposition: A | Discharge status: Home / Self Care | Payer: Medicare HMO | Source: Ambulatory Visit | Attending: Nurse Practitioner | Admitting: Nurse Practitioner

## 2022-08-06 DIAGNOSIS — R072 Precordial pain: Secondary | ICD-10-CM | POA: Diagnosis not present

## 2022-08-06 MED ORDER — IOHEXOL 350 MG/ML SOLN
100.0000 mL | Freq: Once | INTRAVENOUS | Status: AC | PRN
  Administered 2022-08-06: 100 mL via INTRAVENOUS

## 2022-08-06 MED ORDER — NITROGLYCERIN 0.4 MG SL SUBL
SUBLINGUAL_TABLET | SUBLINGUAL | Status: AC
  Filled 2022-08-06: qty 2

## 2022-08-06 MED ORDER — NITROGLYCERIN 0.4 MG SL SUBL
0.8000 mg | SUBLINGUAL_TABLET | Freq: Once | SUBLINGUAL | Status: AC
  Administered 2022-08-06: 0.8 mg via SUBLINGUAL

## 2022-08-10 ENCOUNTER — Other Ambulatory Visit: Payer: Self-pay | Admitting: *Deleted

## 2022-08-10 DIAGNOSIS — I700 Atherosclerosis of aorta: Secondary | ICD-10-CM | POA: Diagnosis not present

## 2022-08-10 DIAGNOSIS — R946 Abnormal results of thyroid function studies: Secondary | ICD-10-CM | POA: Diagnosis not present

## 2022-08-10 DIAGNOSIS — M47816 Spondylosis without myelopathy or radiculopathy, lumbar region: Secondary | ICD-10-CM | POA: Diagnosis not present

## 2022-08-10 DIAGNOSIS — E785 Hyperlipidemia, unspecified: Secondary | ICD-10-CM

## 2022-08-10 DIAGNOSIS — E538 Deficiency of other specified B group vitamins: Secondary | ICD-10-CM | POA: Diagnosis not present

## 2022-08-10 DIAGNOSIS — E559 Vitamin D deficiency, unspecified: Secondary | ICD-10-CM | POA: Diagnosis not present

## 2022-08-10 DIAGNOSIS — M545 Low back pain, unspecified: Secondary | ICD-10-CM | POA: Diagnosis not present

## 2022-08-10 DIAGNOSIS — Z13220 Encounter for screening for lipoid disorders: Secondary | ICD-10-CM | POA: Diagnosis not present

## 2022-08-10 DIAGNOSIS — I25118 Atherosclerotic heart disease of native coronary artery with other forms of angina pectoris: Secondary | ICD-10-CM

## 2022-08-10 DIAGNOSIS — Z79899 Other long term (current) drug therapy: Secondary | ICD-10-CM | POA: Diagnosis not present

## 2022-08-17 DIAGNOSIS — R946 Abnormal results of thyroid function studies: Secondary | ICD-10-CM | POA: Diagnosis not present

## 2022-08-17 DIAGNOSIS — Z0000 Encounter for general adult medical examination without abnormal findings: Secondary | ICD-10-CM | POA: Diagnosis not present

## 2022-08-17 DIAGNOSIS — E559 Vitamin D deficiency, unspecified: Secondary | ICD-10-CM | POA: Diagnosis not present

## 2022-08-17 DIAGNOSIS — E538 Deficiency of other specified B group vitamins: Secondary | ICD-10-CM | POA: Diagnosis not present

## 2022-08-17 DIAGNOSIS — M545 Low back pain, unspecified: Secondary | ICD-10-CM | POA: Diagnosis not present

## 2022-08-17 DIAGNOSIS — Z79899 Other long term (current) drug therapy: Secondary | ICD-10-CM | POA: Diagnosis not present

## 2022-08-17 DIAGNOSIS — Z13220 Encounter for screening for lipoid disorders: Secondary | ICD-10-CM | POA: Diagnosis not present

## 2022-08-17 DIAGNOSIS — M47816 Spondylosis without myelopathy or radiculopathy, lumbar region: Secondary | ICD-10-CM | POA: Diagnosis not present

## 2022-08-24 ENCOUNTER — Encounter: Payer: Self-pay | Admitting: *Deleted

## 2022-08-27 ENCOUNTER — Ambulatory Visit (HOSPITAL_COMMUNITY)
Admission: RE | Admit: 2022-08-27 | Discharge: 2022-08-27 | Disposition: A | Discharge status: Home / Self Care | Payer: Medicare HMO | Source: Ambulatory Visit | Attending: Neurology | Admitting: Neurology

## 2022-08-27 DIAGNOSIS — G309 Alzheimer's disease, unspecified: Secondary | ICD-10-CM | POA: Diagnosis not present

## 2022-08-27 DIAGNOSIS — G3109 Other frontotemporal dementia: Secondary | ICD-10-CM | POA: Diagnosis not present

## 2022-08-27 LAB — GLUCOSE, CAPILLARY: Glucose-Capillary: 109 mg/dL — ABNORMAL HIGH (ref 70–99)

## 2022-08-27 MED ORDER — FLUDEOXYGLUCOSE F - 18 (FDG) INJECTION
10.0000 | Freq: Once | INTRAVENOUS | Status: AC
  Administered 2022-08-27: 9.97 via INTRAVENOUS

## 2022-09-02 DIAGNOSIS — M545 Low back pain, unspecified: Secondary | ICD-10-CM | POA: Diagnosis not present

## 2022-09-02 DIAGNOSIS — M47816 Spondylosis without myelopathy or radiculopathy, lumbar region: Secondary | ICD-10-CM | POA: Diagnosis not present

## 2022-09-16 DIAGNOSIS — B351 Tinea unguium: Secondary | ICD-10-CM | POA: Diagnosis not present

## 2022-09-16 DIAGNOSIS — M792 Neuralgia and neuritis, unspecified: Secondary | ICD-10-CM | POA: Diagnosis not present

## 2022-09-16 DIAGNOSIS — L609 Nail disorder, unspecified: Secondary | ICD-10-CM | POA: Diagnosis not present

## 2022-09-27 DIAGNOSIS — L601 Onycholysis: Secondary | ICD-10-CM | POA: Diagnosis not present

## 2022-09-30 ENCOUNTER — Ambulatory Visit (INDEPENDENT_AMBULATORY_CARE_PROVIDER_SITE_OTHER): Payer: Medicare HMO | Admitting: Orthopaedic Surgery

## 2022-09-30 ENCOUNTER — Ambulatory Visit (INDEPENDENT_AMBULATORY_CARE_PROVIDER_SITE_OTHER): Payer: Medicare HMO

## 2022-09-30 DIAGNOSIS — M8588 Other specified disorders of bone density and structure, other site: Secondary | ICD-10-CM | POA: Diagnosis not present

## 2022-09-30 DIAGNOSIS — M5416 Radiculopathy, lumbar region: Secondary | ICD-10-CM

## 2022-09-30 DIAGNOSIS — L570 Actinic keratosis: Secondary | ICD-10-CM | POA: Diagnosis not present

## 2022-09-30 DIAGNOSIS — M545 Low back pain, unspecified: Secondary | ICD-10-CM | POA: Diagnosis not present

## 2022-09-30 NOTE — Progress Notes (Signed)
Chief Complaint: Radiculopathy     History of Present Illness:    Donna [name] is a 72 y.o. female presents with left-sided lumbar radiculopathy which be been occurring over the last 20 years.  She was previously diagnosed with lumbar radiculitis.  At that time multiple sessions of physical therapy were introduced.  She is hoping to defer any type of injection.  She has previously worked as a Engineer, civil (consulting).  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   chest - CX3 + 5FU   Squamous cell carcinoma of skin 09/27/2014   left thigh - tx p bx   Substance abuse (HCC)    Past Surgical History:  Procedure Laterality Date   ANKLE SURGERY     Bilateral    CATARACT EXTRACTION Bilateral    CERVICAL SPINE SURGERY  07/21/2021   TUBAL LIGATION     Social History   Socioeconomic History   Marital status: Married    Spouse name: Molly Maduro   Number of children: 0   Years of education: Not on file   Highest education level: Associate degree: academic program  Occupational History   Occupation: [employer]  Tobacco Use   Smoking status: Former    Years: 30    Types: Cigarettes    Quit date: 10/15/1996    Years since quitting: 25.9   Smokeless tobacco: Never  Vaping Use   Vaping Use: Never used  Substance and Sexual Activity   Alcohol use: No   Drug use: No   Sexual activity: Yes  Other Topics Concern   Not on file  Social History Narrative   Lives with husband   Right handed    1-2 caffeine drinks daily   Social Determinants of Health   Financial Resource Strain: Not on file  Food Insecurity: Not on file  Transportation Needs: Not on file  Physical Activity: Not on file  Stress: Not on file  Social Connections: Not on file   Family History  Problem Relation Age of Onset   Dementia Mother    Diabetes Mother    Melanoma Father    Colon cancer Father 43       Survived   Heart disease Father    Heart attack Father    Rheum arthritis Father    Dementia Father    Healthy Sister    Healthy Brother    Dementia Maternal Aunt    Dementia Maternal Uncle    Colon polyps Paternal Aunt    Pancreatic cancer Paternal Aunt    Rectal cancer Paternal Aunt    Colon cancer Paternal Aunt    Colon cancer Paternal Grandmother    Breast cancer Maternal Grandmother    Stomach cancer Neg Hx  Esophageal cancer Neg Hx    Liver cancer Neg Hx    Allergies  Allergen Reactions   Ciprofloxacin     Neuropathy    Doxycycline Nausea Only   Gluten Meal     GI upset    Incruse Ellipta [Umeclidinium Bromide]     Urinary retention   Penicillins Nausea And Vomiting    "breaking out"   Quinolones Other (See Comments)    REACTION: sick / neuropathy    Current Outpatient Medications  Medication Sig Dispense Refill   albuterol (VENTOLIN HFA) 108 (90 Base) MCG/ACT inhaler Inhale 2 puffs into the lungs every 4 (four) hours as needed.     b complex vitamins tablet Take 1 tablet by mouth daily.       budesonide-formoterol (SYMBICORT) 80-4.5 MCG/ACT inhaler Inhale 2 puffs into the lungs every 6 (six) hours as needed. 1 each 2   calcium gluconate 500 MG tablet Take 500 mg by mouth daily.     estradiol (ESTRACE) 0.1 MG/GM vaginal cream SMARTSIG:1 Applicator Vaginal Once a Week     lisinopril (ZESTRIL) 5 MG tablet TAKE 1 TABLET BY MOUTH DAILY FOR ELEVATED BP WHILE TRAVELING 90 tablet 1   Loratadine (CLARITIN PO) Take by mouth.     Magnesium 500 MG TABS Take 1 tablet by mouth  daily.       metoprolol tartrate (LOPRESSOR) 50 MG tablet Take one (1) tablet by mouth ( 50 mg) 2 hours prior to CT Scan. 1 tablet 0   Multiple Vitamin (MULTIVITAMIN) capsule Take 1 capsule by mouth daily.       propranolol (INDERAL) 10 MG tablet Take 10 mg by mouth as needed.     rosuvastatin (CRESTOR) 20 MG tablet Take 1 tablet (20 mg total) by mouth daily. 90 tablet 3   valACYclovir (VALTREX) 500 MG tablet ONE BY MOUTH TWICE A DAY X 5 DAYS FOR HERPES OUTBREAK 10 tablet prn   Current Facility-Administered Medications  Medication Dose Route Frequency Provider Last Rate Last Admin   0.9 %  sodium chloride infusion  500 mL Intravenous Continuous Meryl Dare, MD       No results found.  Review of Systems:   A ROS was performed including pertinent positives and negatives as documented in the HPI.  Physical Exam :   Constitutional: NAD and appears stated age Neurological: Alert and oriented Psych: Appropriate affect and cooperative There were no vitals taken for this visit.   Comprehensive Musculoskeletal Exam:    Positive straight leg raise on the left without any obvious weakness in any nerve distributions.  Walks with a normal gait.  Imaging:     I personally reviewed and interpreted the radiographs.   Assessment:   72 y.o. female with lower back pain and leg pain consistent with lumbar radiculopathy.  At this time I would recommend an MRI of the l lumbar spine to rule out any type of neural compression.  Will plan to see this and I will see her back following  Plan :    -Return to clinic following MRI lumbar spine     I personally saw and evaluated the patient, and participated in the management and treatment plan.  Huel Cote, MD Attending Physician, Orthopedic Surgery  This document was dictated using Dragon voice recognition software. A reasonable attempt at proof reading has been made to minimize errors.

## 2022-10-01 ENCOUNTER — Encounter (HOSPITAL_BASED_OUTPATIENT_CLINIC_OR_DEPARTMENT_OTHER): Payer: Self-pay | Admitting: Orthopaedic Surgery

## 2022-10-07 ENCOUNTER — Ambulatory Visit
Admission: RE | Admit: 2022-10-07 | Discharge: 2022-10-07 | Disposition: A | Discharge status: Home / Self Care | Payer: Medicare HMO | Source: Ambulatory Visit | Attending: Orthopaedic Surgery | Admitting: Orthopaedic Surgery

## 2022-10-07 DIAGNOSIS — M5126 Other intervertebral disc displacement, lumbar region: Secondary | ICD-10-CM | POA: Diagnosis not present

## 2022-10-07 DIAGNOSIS — M48061 Spinal stenosis, lumbar region without neurogenic claudication: Secondary | ICD-10-CM | POA: Diagnosis not present

## 2022-10-07 DIAGNOSIS — M5416 Radiculopathy, lumbar region: Secondary | ICD-10-CM

## 2022-10-08 DIAGNOSIS — M5416 Radiculopathy, lumbar region: Secondary | ICD-10-CM | POA: Diagnosis not present

## 2022-10-12 ENCOUNTER — Ambulatory Visit: Payer: Medicare HMO | Attending: Nurse Practitioner

## 2022-10-12 DIAGNOSIS — I25118 Atherosclerotic heart disease of native coronary artery with other forms of angina pectoris: Secondary | ICD-10-CM | POA: Diagnosis not present

## 2022-10-12 DIAGNOSIS — E785 Hyperlipidemia, unspecified: Secondary | ICD-10-CM

## 2022-10-12 LAB — LIPID PANEL
Chol/HDL Ratio: 2.6 ratio (ref 0.0–4.4)
Cholesterol, Total: 218 mg/dL — ABNORMAL HIGH (ref 100–199)
HDL: 83 mg/dL (ref >39)
LDL Chol Calc (NIH): 128 mg/dL — ABNORMAL HIGH (ref 0–99)
Triglycerides: 38 mg/dL (ref 0–149)
VLDL Cholesterol Cal: 7 mg/dL (ref 5–40)

## 2022-10-14 DIAGNOSIS — L603 Nail dystrophy: Secondary | ICD-10-CM | POA: Diagnosis not present

## 2022-10-29 ENCOUNTER — Encounter: Payer: Self-pay | Admitting: Dermatology

## 2022-10-29 ENCOUNTER — Ambulatory Visit (INDEPENDENT_AMBULATORY_CARE_PROVIDER_SITE_OTHER): Payer: Medicare HMO | Admitting: Dermatology

## 2022-10-29 VITALS — BP 99/62

## 2022-10-29 DIAGNOSIS — S40862A Insect bite (nonvenomous) of left upper arm, initial encounter: Secondary | ICD-10-CM

## 2022-10-29 DIAGNOSIS — C4491 Basal cell carcinoma of skin, unspecified: Secondary | ICD-10-CM

## 2022-10-29 DIAGNOSIS — C4492 Squamous cell carcinoma of skin, unspecified: Secondary | ICD-10-CM

## 2022-10-29 DIAGNOSIS — C44719 Basal cell carcinoma of skin of left lower limb, including hip: Secondary | ICD-10-CM

## 2022-10-29 DIAGNOSIS — D485 Neoplasm of uncertain behavior of skin: Secondary | ICD-10-CM

## 2022-10-29 DIAGNOSIS — W57XXXA Bitten or stung by nonvenomous insect and other nonvenomous arthropods, initial encounter: Secondary | ICD-10-CM | POA: Diagnosis not present

## 2022-10-29 DIAGNOSIS — S40861A Insect bite (nonvenomous) of right upper arm, initial encounter: Secondary | ICD-10-CM

## 2022-10-29 DIAGNOSIS — B351 Tinea unguium: Secondary | ICD-10-CM | POA: Diagnosis not present

## 2022-10-29 DIAGNOSIS — D492 Neoplasm of unspecified behavior of bone, soft tissue, and skin: Secondary | ICD-10-CM

## 2022-10-29 DIAGNOSIS — W57XXXS Bitten or stung by nonvenomous insect and other nonvenomous arthropods, sequela: Secondary | ICD-10-CM

## 2022-10-29 HISTORY — DX: Squamous cell carcinoma of skin, unspecified: C44.92

## 2022-10-29 HISTORY — DX: Basal cell carcinoma of skin, unspecified: C44.91

## 2022-10-29 NOTE — Patient Instructions (Addendum)
Treatment Plan: Continue Ciclopirox solution daily as prescribed. New nail growth present at base. Informed patient to allow for at least 6 more months of using medication to see results.     Patient Handout: Wound Care for Skin Biopsy Site  Patient Handout: Wound Care for Skin Biopsy Site  Taking Care of Your Skin Biopsy Site  Proper care of the biopsy site is essential for promoting healing and minimizing scarring. This handout provides instructions on how to care for your biopsy site to ensure optimal recovery.  1. Cleaning the Wound:  Clean the biopsy site daily with gentle soap and water. Gently pat the area dry with a clean, soft towel. Avoid harsh scrubbing or rubbing the area, as this can irritate the skin and delay healing.  2. Applying Aquaphor and Bandage:  After cleaning the wound, apply a thin layer of Aquaphor ointment to the biopsy site. Cover the area with a sterile bandage to protect it from dirt, bacteria, and friction. Change the bandage daily or as needed if it becomes soiled or wet.  3. Continued Care for One Week:  Repeat the cleaning, Aquaphor application, and bandaging process daily for one week following the biopsy procedure. Keeping the wound clean and moist during this initial healing period will help prevent infection and promote optimal healing.  4. Massaging Aquaphor into the Area:  ---After one week, discontinue the use of bandages but continue to apply Aquaphor to the biopsy site. ----Gently massage the Aquaphor into the area using circular motions. ---Massaging the skin helps to promote circulation and prevent the formation of scar tissue.   Additional Tips:  Avoid exposing the biopsy site to direct sunlight during the healing process, as this can cause hyperpigmentation or worsen scarring. If you experience any signs of infection, such as increased redness, swelling, warmth, or drainage from the wound, contact your healthcare provider  immediately. Follow any additional instructions provided by your healthcare provider for caring for the biopsy site and managing any discomfort. Conclusion:  Taking proper care of your skin biopsy site is crucial for ensuring optimal healing and minimizing scarring. By following these instructions for cleaning, applying Aquaphor, and massaging the area, you can promote a smooth and successful recovery. If you have any questions or concerns about caring for your biopsy site, don't hesitate to contact your healthcare provider for guidance.      Due to recent changes in healthcare laws, you may see results of your pathology and/or laboratory studies on MyChart before the doctors have had a chance to review them. We understand that in some cases there may be results that are confusing or concerning to you. Please understand that not all results are received at the same time and often the doctors may need to interpret multiple results in order to provide you with the best plan of care or course of treatment. Therefore, we ask that you please give [place] 2 business days to thoroughly review all your results before contacting the office for clarification. Should we see a critical lab result, you will be contacted sooner.   If You Need Anything After Your Visit  If you have any questions or concerns for your doctor, please call our main line at [phone] If no one answers, please leave a voicemail as directed and we will return your call as soon as possible. Messages left after 4 pm will be answered the following business day.   You may also send [place] a message via MyChart. We typically respond to  MyChart messages within 1-2 business days.  For prescription refills, please ask your pharmacy to contact our office. Our fax number is [phone].  If you have an urgent issue when the clinic is closed that cannot wait until the next business day, you can page your doctor at the number below.    Please note  that while we do our best to be available for urgent issues outside of office hours, we are not available 24/7.   If you have an urgent issue and are unable to reach [place], you may choose to seek medical care at your doctor's office, retail clinic, urgent care center, or emergency room.  If you have a medical emergency, please immediately call 911 or go to the emergency department. In the event of inclement weather, please call our main line at [phone] for an update on the status of any delays or closures.  Dermatology Medication Tips: Please keep the boxes that topical medications come in in order to help keep track of the instructions about where and how to use these. Pharmacies typically print the medication instructions only on the boxes and not directly on the medication tubes.   If your medication is too expensive, please contact our office at [phone] or send [place] a message through MyChart.   We are unable to tell what your co-pay for medications will be in advance as this is different depending on your insurance coverage. However, we may be able to find a substitute medication at lower cost or fill out paperwork to get insurance to cover a needed medication.   If a prior authorization is required to get your medication covered by your insurance company, please allow [place] 1-2 business days to complete this process.  Drug prices often vary depending on where the prescription is filled and some pharmacies may offer cheaper prices.  The website www.goodrx.com contains coupons for medications through different pharmacies. The prices here do not account for what the cost may be with help from insurance (it may be cheaper with your insurance), but the website can give you the price if you did not use any insurance.  - You can print the associated coupon and take it with your prescription to the pharmacy.  - You may also stop by our office during regular business hours and pick up a GoodRx  coupon card.  - If you need your prescription sent electronically to a different pharmacy, notify our office through [facility] or by phone at [phone]    Patient Handout: Wound Care for Skin Biopsy Site  Patient Handout: Wound Care for Skin Biopsy Site  Taking Care of Your Skin Biopsy Site  Proper care of the biopsy site is essential for promoting healing and minimizing scarring. This handout provides instructions on how to care for your biopsy site to ensure optimal recovery.  1. Cleaning the Wound:  Clean the biopsy site daily with gentle soap and water. Gently pat the area dry with a clean, soft towel. Avoid harsh scrubbing or rubbing the area, as this can irritate the skin and delay healing.  2. Applying Aquaphor and Bandage:  After cleaning the wound, apply a thin layer of Aquaphor ointment to the biopsy site. Cover the area with a sterile bandage to protect it from dirt, bacteria, and friction. Change the bandage daily or as needed if it becomes soiled or wet.  3. Continued Care for One Week:  Repeat the cleaning, Aquaphor application, and bandaging process daily for one week following  the biopsy procedure. Keeping the wound clean and moist during this initial healing period will help prevent infection and promote optimal healing.  4. Massaging Aquaphor into the Area:  ---After one week, discontinue the use of bandages but continue to apply Aquaphor to the biopsy site. ----Gently massage the Aquaphor into the area using circular motions. ---Massaging the skin helps to promote circulation and prevent the formation of scar tissue.   Additional Tips:  Avoid exposing the biopsy site to direct sunlight during the healing process, as this can cause hyperpigmentation or worsen scarring. If you experience any signs of infection, such as increased redness, swelling, warmth, or drainage from the wound, contact your healthcare provider immediately. Follow any  additional instructions provided by your healthcare provider for caring for the biopsy site and managing any discomfort. Conclusion:  Taking proper care of your skin biopsy site is crucial for ensuring optimal healing and minimizing scarring. By following these instructions for cleaning, applying Aquaphor, and massaging the area, you can promote a smooth and successful recovery. If you have any questions or concerns about caring for your biopsy site, don't hesitate to contact your healthcare provider for guidance.

## 2022-10-29 NOTE — Progress Notes (Signed)
   New Patient Visit   Subjective  Donna [name] is a 72 y.o. female who presents for the following: She has been going to a Podiatrist whom took a sample and it did not show fungus. She has had yellow discoloration and thickening of the toenails for 6 months. She is applying Ciclopirox solution daily x 6 months prescribed by a Dermatologist in [facility]. She does not think its helping. Prior history of Pedicures  She is concerned of a scaly persistent lesion at the right lat lower leg that she says was frozen twice, the first time probably being at the 06/29/22 visit. And then again on 07/28/22. She says it is tender and scaly. She is applying OTC antibiotic ointment daily. Lesion at the left let lower leg x 1 week that is tender and scaly. Lesions on the upper arms that are scaly and tender. She has a history of BCC and SCC. Grandfather with history of Melanoma.    The following portions of the chart were reviewed this encounter and updated as appropriate: medications, allergies, medical history  Review of Systems:  No other skin or systemic complaints except as noted in HPI or Assessment and Plan.  Objective  Well appearing patient in no apparent distress; mood and affect are within normal limits.   A focused examination was performed of the following areas: Upper body exam, lower legs and feet  Relevant exam findings are noted in the Assessment and Plan.  Right Lower Leg - Anterior 2.5 cm pink crusty plaque  Left Lower Leg - Posterior 8mm pink crusted papule    Assessment & Plan   ONYCHOMYCOSIS Exam: Thickened toenails with subungal debris c/w onychomycosis  Chronic and persistent condition with duration or expected duration over one year. Condition is symptomatic/ bothersome to patient. Not currently at goal..  Treatment Plan: Continue Ciclopirox solution daily as prescribed. New nail growth present at base. Informed patient to allow for at least 6 more months of using  medication to see results.    Insect Bites (Resolving) Exam: erythematous macules on the upper arms  Treatment Plan: Allow areas to heal. Keep well moisturized.     Neoplasm of skin (2) Right Lower Leg - Anterior  Skin / nail biopsy Type of biopsy: tangential   Informed consent: discussed and consent obtained   Timeout: patient name, date of birth, surgical site, and procedure verified   Procedure prep:  Patient was prepped and draped in usual sterile fashion Prep type:  Isopropyl alcohol Anesthesia: the lesion was anesthetized in a standard fashion   Anesthetic:  1% lidocaine w/ epinephrine 1-100,000 buffered w/ 8.4% NaHCO3 Instrument used: DermaBlade   Hemostasis achieved with: aluminum chloride   Outcome: patient tolerated procedure well   Post-procedure details: sterile dressing applied and wound care instructions given   Dressing type: petrolatum gauze and bandage    Left Lower Leg - Posterior    No follow-ups on file.  Jaclynn Guarneri, CMA, am acting as scribe for [employer], DO.   Documentation: I have reviewed the above documentation for accuracy and completeness, and I agree with the above.  Langston Reusing, DO

## 2022-10-30 ENCOUNTER — Telehealth (INDEPENDENT_AMBULATORY_CARE_PROVIDER_SITE_OTHER): Payer: Medicare HMO | Admitting: Orthopaedic Surgery

## 2022-10-30 DIAGNOSIS — M5416 Radiculopathy, lumbar region: Secondary | ICD-10-CM | POA: Diagnosis not present

## 2022-10-30 NOTE — Progress Notes (Addendum)
Chief Complaint: Radiculopathy     History of Present Illness:   Virtual consult conducted she is at her in [place] via a Telehealth note  Patient Location: [address]  [place] [address]   Provider Location: [address], [place] [address]  Method: Telehealth epic video visit   10/30/2022: Donna [name] presents today for follow-up via virtual interview for her back.  She recently was able to attend a yoga retreat and overall her back and radiating type symptoms are much better.   Donna [name] is a 72 y.o. female presents with left-sided lumbar radiculopathy which be been occurring over the last 20 years.  She was previously diagnosed with lumbar radiculitis.  At that time multiple sessions of physical therapy were introduced.  She is hoping to defer any type of injection.  She has previously worked as a Engineer, civil (consulting).  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   chest - CX3 + 5FU   Squamous cell carcinoma of skin 09/27/2014   left thigh - tx p bx   Substance abuse (HCC)    Past Surgical History:  Procedure Laterality Date   ANKLE SURGERY     Bilateral    CATARACT EXTRACTION Bilateral    CERVICAL SPINE SURGERY  07/21/2021   TUBAL LIGATION     Social History   Socioeconomic History   Marital status: Married    Spouse name: Molly Maduro   Number of children: 0   Years of education: Not on file   Highest education level: Associate degree:  academic program  Occupational History   Occupation: [employer]  Tobacco Use   Smoking status: Former    Years: 30    Types: Cigarettes    Quit date: 10/15/1996    Years since quitting: 26.0   Smokeless tobacco: Never  Vaping Use   Vaping Use: Never used  Substance and Sexual Activity   Alcohol use: No   Drug use: No   Sexual activity: Yes  Other Topics Concern   Not on file  Social History Narrative   Lives with husband   Right handed   1-2 caffeine drinks daily   Social Determinants of Health   Financial Resource Strain: Not on file  Food Insecurity: Not on file  Transportation Needs: Not on file  Physical Activity: Not on file  Stress: Not on file  Social Connections: Not on file   Family History  Problem Relation Age of Onset   Dementia Mother    Diabetes Mother    Melanoma Father    Colon cancer Father 72       Survived   Heart disease Father    Heart attack Father    Rheum arthritis Father  Dementia Father    Healthy Sister    Healthy Brother    Dementia Maternal Aunt    Dementia Maternal Uncle    Colon polyps Paternal Aunt    Pancreatic cancer Paternal Aunt    Rectal cancer Paternal Aunt    Colon cancer Paternal Aunt    Colon cancer Paternal Grandmother    Breast cancer Maternal Grandmother    Stomach cancer Neg Hx    Esophageal cancer Neg Hx    Liver cancer Neg Hx    Allergies  Allergen Reactions   Ciprofloxacin     Neuropathy    Doxycycline Nausea Only   Gluten Meal     GI upset    Incruse Ellipta [Umeclidinium Bromide]     Urinary retention   Penicillins Nausea And Vomiting    "breaking out"   Quinolones Other (See Comments)    REACTION: sick / neuropathy    Current Outpatient Medications  Medication Sig Dispense Refill   albuterol (VENTOLIN HFA) 108 (90 Base) MCG/ACT inhaler Inhale 2 puffs into the lungs every 4 (four) hours as needed.     b complex vitamins tablet Take 1 tablet by mouth daily.       budesonide-formoterol  (SYMBICORT) 80-4.5 MCG/ACT inhaler Inhale 2 puffs into the lungs every 6 (six) hours as needed. 1 each 2   calcium gluconate 500 MG tablet Take 500 mg by mouth daily.     estradiol (ESTRACE) 0.1 MG/GM vaginal cream SMARTSIG:1 Applicator Vaginal Once a Week     lisinopril (ZESTRIL) 5 MG tablet TAKE 1 TABLET BY MOUTH DAILY FOR ELEVATED BP WHILE TRAVELING 90 tablet 1   Loratadine (CLARITIN PO) Take by mouth.     Magnesium 500 MG TABS Take 1 tablet by mouth daily.       metoprolol tartrate (LOPRESSOR) 50 MG tablet Take one (1) tablet by mouth ( 50 mg) 2 hours prior to CT Scan. 1 tablet 0   Multiple Vitamin (MULTIVITAMIN) capsule Take 1 capsule by mouth daily.       propranolol (INDERAL) 10 MG tablet Take 10 mg by mouth as needed.     rosuvastatin (CRESTOR) 20 MG tablet Take 1 tablet (20 mg total) by mouth daily. 90 tablet 3   valACYclovir (VALTREX) 500 MG tablet ONE BY MOUTH TWICE A DAY X 5 DAYS FOR HERPES OUTBREAK 10 tablet prn   Current Facility-Administered Medications  Medication Dose Route Frequency Provider Last Rate Last Admin   0.9 %  sodium chloride infusion  500 mL Intravenous Continuous Meryl Dare, MD       No results found.  Review of Systems:   A ROS was performed including pertinent positives and negatives as documented in the HPI.  Physical Exam :   Constitutional: NAD and appears stated age Neurological: Alert and oriented Psych: Appropriate affect and cooperative There were no vitals taken for this visit.   Comprehensive Musculoskeletal Exam:    Positive straight leg raise on the left without any obvious weakness in any nerve distributions.  Walks with a normal gait.  Imaging:    Lumbar spine MRI: There is evidence of an L4-L5 paracentral disc herniation I personally reviewed and interpreted the radiographs.   Assessment:   72 y.o. female with lower back pain and leg pain consistent with lumbar radiculopathy.  She is feeling much better on today's exam.  I  did discuss that this is consistent her mild paracentral disc herniation.  Overall I do believe that she can continue  to treat this symptomatically with Robaxin as needed.  She may call or MyChart [place] should she need to consider an additional steroid injection. Plan :    -Return to clinic as needed     I personally saw and evaluated the patient, and participated in the management and treatment plan.  Huel Cote, MD Attending Physician, Orthopedic Surgery  This document was dictated using Dragon voice recognition software. A reasonable attempt at proof reading has been made to minimize errors.

## 2022-11-05 ENCOUNTER — Other Ambulatory Visit: Payer: Self-pay

## 2022-11-05 ENCOUNTER — Ambulatory Visit (HOSPITAL_BASED_OUTPATIENT_CLINIC_OR_DEPARTMENT_OTHER): Payer: Medicare HMO | Admitting: Pulmonary Disease

## 2022-11-05 ENCOUNTER — Telehealth: Payer: Self-pay

## 2022-11-05 VITALS — BP 140/68 | HR 54 | Temp 97.9°F | Ht 67.0 in | Wt 152.2 lb

## 2022-11-05 DIAGNOSIS — C44719 Basal cell carcinoma of skin of left lower limb, including hip: Secondary | ICD-10-CM

## 2022-11-05 DIAGNOSIS — C4492 Squamous cell carcinoma of skin, unspecified: Secondary | ICD-10-CM

## 2022-11-05 DIAGNOSIS — J4520 Mild intermittent asthma, uncomplicated: Secondary | ICD-10-CM | POA: Diagnosis not present

## 2022-11-05 NOTE — Progress Notes (Signed)
Hi Clydie Braun,  Please call patient and notify that results of biopsy were positive for a skin cancer that needs to be treated with Excision with a Mohs Surgeon .  We will refer to Dr. Jeannine Boga at [facility]

## 2022-11-05 NOTE — Patient Instructions (Addendum)
Intermittent asthma Acute bronchitis/asthma attack - improving without steroids --CONTINUE Symbicort 1-2 puffs every 6 hours as needed --Consider taking more consistently 2 puffs twice a day during winter months --CONTINUE Albuterol AS NEEDED for shortness of breath or wheezing --Encourage regular aerobic exercise daily

## 2022-11-05 NOTE — Progress Notes (Signed)
Subjective:   PATIENT ID: Donna [name] GENDER: female DOB: Aug 14, 1950, MRN: [id number]  Chief Complaint  Patient presents with   Follow-up    Follow up.     Reason for Visit: Asthma follow-up  Ms. Donna [name] is a 72 year old female former smoker with adult onset asthma, HTN, HLD, essential tremor, IBS, HLD who present for asthma follow-up  Initial consult 03/2022 She was diagnosed with asthma in 2020/2021 with methacholine test in [place]. Previously on Flovent and Alvesco but unable to tolerate due to nausea. She reports unable to take prednisone due severe irritability. She was last seen by Allergy Asthma for initial asthma evaluation in May 2023. On albuterol as needed for stable symptoms. Has previously tried ICS inhaler with no improvement.  Today she reports shortness of breath with walking. Also having non-productive cough. Denies wheezing. Uses albuterol prior to exercise. Cold air, cold food triggers and anxiety as well. Chemicals, strong odors will trigger. No recent exaerbations requiring treatment with antibiotics.  Has been evaluated by GI for reflux and negative. Previously had allergy testing which is negative.   05/27/22 She was not able to tolerate Incruse due to urinary retention. She reports asthma attack associated with cold air and exercise. Used albuterol inhaler once every 2 weeks.  Faxed notes from Dr. Lenord Fellers and Jerre Simon in 2021-2023 reviewed. Notes report +methacholine challenge with 21% decrease in FEV1 discussed on 12/13/19 visit. Previously on Flovent. Sleep study was ordered on 04/29/21 for snoring and frequent nocturnal awakeneings. Falls asleep in meetings and fatigued.  11/05/22 Since our last visit she was prescribed low dose symbicort as needed. Very minimal symptoms since our last visit and was able to even walk in the morning however she recently went to [facility] for a week in early-mid June. She reports increased cough and unsure what triggered  it but was exposed to [facility] and heat throughout the trip. Usually triggered by odors, chemicals, humidity. No illnesses the trip. Now using Symbicort two puffs in the morning with some improvement.    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   chest - CX3 + 5FU   Squamous cell carcinoma of skin 09/27/2014   left thigh - tx p bx   Substance abuse (HCC)      Family History  Problem Relation Age of Onset   Dementia Mother    Diabetes Mother    Melanoma Father    Colon cancer Father 41       Survived   Heart disease Father    Heart attack Father    Rheum arthritis Father  Dementia Father    Healthy Sister    Healthy Brother    Dementia Maternal Aunt    Dementia Maternal Uncle    Colon polyps Paternal Aunt    Pancreatic cancer Paternal Aunt    Rectal cancer Paternal Aunt    Colon cancer Paternal Aunt    Colon cancer Paternal Grandmother    Breast cancer Maternal Grandmother    Stomach cancer Neg Hx    Esophageal cancer Neg Hx    Liver cancer Neg Hx      Social History   Occupational History   Occupation: [employer]  Tobacco Use   Smoking status: Former    Years: 30    Types: Cigarettes    Quit date: 10/15/1996    Years since quitting: 26.0   Smokeless tobacco:  Never  Vaping Use   Vaping Use: Never used  Substance and Sexual Activity   Alcohol use: No   Drug use: No   Sexual activity: Yes    Allergies  Allergen Reactions   Ciprofloxacin     Neuropathy    Doxycycline Nausea Only   Gluten Meal     GI upset    Incruse Ellipta [Umeclidinium Bromide]     Urinary retention   Penicillins Nausea And Vomiting    "breaking out"   Quinolones Other (See Comments)    REACTION: sick / neuropathy      Outpatient Medications Prior to Visit  Medication Sig Dispense Refill   albuterol (VENTOLIN HFA) 108 (90 Base) MCG/ACT inhaler Inhale 2 puffs into the lungs every 4 (four) hours as needed.     b complex vitamins tablet Take 1 tablet by mouth daily.       budesonide-formoterol (SYMBICORT) 80-4.5 MCG/ACT inhaler Inhale 2 puffs into the lungs every 6 (six) hours as needed. 1 each 2   calcium gluconate 500 MG tablet Take 500 mg by mouth daily.     estradiol (ESTRACE) 0.1 MG/GM vaginal cream SMARTSIG:1 Applicator Vaginal Once a Week     lisinopril (ZESTRIL) 5 MG tablet TAKE 1 TABLET BY MOUTH DAILY FOR ELEVATED BP WHILE TRAVELING 90 tablet 1   Loratadine (CLARITIN PO) Take by mouth.     Magnesium 500 MG TABS Take 1 tablet by mouth daily.       Multiple Vitamin (MULTIVITAMIN) capsule Take 1 capsule by mouth daily.       propranolol (INDERAL) 10 MG tablet Take 10 mg by mouth as needed.     rosuvastatin (CRESTOR) 20 MG tablet Take 1 tablet (20 mg total) by mouth daily. 90 tablet 3   valACYclovir (VALTREX) 500 MG tablet ONE BY MOUTH TWICE A DAY X 5 DAYS FOR HERPES OUTBREAK 10 tablet prn   metoprolol tartrate (LOPRESSOR) 50 MG tablet Take one (1) tablet by mouth ( 50 mg) 2 hours prior to CT Scan. 1 tablet 0   Facility-Administered Medications Prior to Visit  Medication Dose Route Frequency Provider Last Rate Last Admin   0.9 %  sodium chloride infusion  500 mL Intravenous Continuous Meryl Dare, MD        Review of Systems  Constitutional:  Negative  for chills, diaphoresis, fever, malaise/fatigue and weight loss.  HENT:  Negative for congestion.   Respiratory:  Positive for cough. Negative for hemoptysis, sputum production, shortness of breath and wheezing.   Cardiovascular:  Negative for chest pain, palpitations and leg swelling.     Objective:   Vitals:   11/05/22 1406  BP: (!) 140/68  Pulse: (!) 54  Temp: 97.9 F (36.6 C)  TempSrc: Oral  SpO2: 97%  Weight: 152 lb 3.2 oz (69 kg)  Height: 5\' 7"  (1.702 m)    SpO2: 97 % O2 Device: None (Room air)  Physical Exam: General: Well-appearing, no acute distress HENT: [place], AT Eyes: EOMI, no scleral icterus Respiratory: Clear to auscultation bilaterally.  No crackles, wheezing or rales Cardiovascular: RRR, -M/R/G, no JVD Extremities:-Edema,-tenderness Neuro: AAO x4, CNII-XII grossly intact Psych: Normal mood, normal affect  Data Reviewed:  Imaging: CT Chest 06/16/21 ([facility] report only) - 2 mm LL nodule, decreased compared to prior CT Cardiac 05/06/22 - Visualized lung parenchyma with stable 3mm LLL lung nodule. No masses, infiltrate, effusion or pneumothorax.  PFT: 09/07/19 FVC 3.07 (86%) FEV1 2.35 (86%) Ratio 76  TLC 74% DLCO 93% Interpretation: Mild obstructive and restrictive defect. Normal DLCO  Spirometry 08/25/21 FVC 2.25 (71%) FEV1 1.42 (58%) Ratio 0.63  Spirometry 10/03/21 FVC 1.98 (62%) FEV1 1.5 (61%) Ratio 0.76  Labs: CBC    Component Value Date/Time   WBC 3.6 (L) 02/02/2021 0545   RBC 3.92 02/02/2021 0545   HGB 11.7 (L) 02/02/2021 0545   HCT 34.5 (L) 02/02/2021 0545   PLT 230 02/02/2021 0545   MCV 88.0 02/02/2021 0545   MCH 29.8 02/02/2021 0545   MCHC 33.9 02/02/2021 0545   RDW 12.6 02/02/2021 0545   LYMPHSABS 882 07/02/2020 0924   MONOABS 0.5 01/18/2019 1228   EOSABS 61 07/02/2020 0924   BASOSABS 30 07/02/2020 0924   Abs eos 07/02/20 - 61     Assessment & Plan:   Discussion: 72 year old female former smoker with adult onset asthma, HTN, HLD,  essential tremor, IBS, HLD who present for asthma follow-up. Symptoms improved on low-dose Symbicort. Counseled on bronchodilator management as noted below  Unable to tolerated ICS due to agitation (previously on Flovent). Unable to tolerate LAMA due to urinary retention. Discussed option for ICS/SABA PRN. Provided coupon for Airsupra.    If needed in the future, consider low dose prednisone for exacerbation due to agitation (20 mg).  Intermittent asthma Acute bronchitis/asthma attack - improving without steroids --CONTINUE Symbicort 1-2 puffs every 6 hours as needed --Consider taking more consistently 2 puffs twice a day during winter months --CONTINUE Albuterol AS NEEDED for shortness of breath or wheezing --Encourage regular aerobic exercise daily  Left lower lobe lung nodule, benign --No further follow-up    Health Maintenance Immunization History  Administered Date(s) Administered   Influenza,trivalent, recombinat, inj, PF 02/16/2014   Influenza-Unspecified 02/22/2014, 02/05/2015, 02/05/2015   PFIZER Comirnaty(Gray Top)Covid-19 Tri-Sucrose Vaccine 06/11/2020   PFIZER(Purple Top)SARS-COV-2 Vaccination 07/09/2019, 08/02/2019, 12/28/2019   Tdap 05/26/2007, 07/12/2018   CT Lung Screen- not qualified. Quit smoking >15 years  No orders of the defined types were placed in this encounter.  No orders of the defined types were placed in this encounter.   Return in about 6 months (around 05/07/2023).   I have spent a total time of 20-minutes on the day of the appointment including chart review, data review, collecting history, coordinating care and discussing medical diagnosis and plan with the patient/family. Past medical history, allergies, medications were reviewed. Pertinent imaging, labs and tests included in this note have been reviewed and interpreted independently by me.  Audon Heymann Mechele Collin, MD [facility] 11/05/2022 2:42 PM  Office Number  [phone]

## 2022-11-05 NOTE — Progress Notes (Signed)
Hi Michele,  (I originally sent to karen but didn't realize she is not here today)  Please call patient and notify that results of biopsy were positive for a skin cancer that needs to be treated with Excision with a Mohs Surgeon .  We will refer to Dr. Jeannine Boga at [facility]  Please call patient and notify that results of biopsy were positive for a skin cancer that needs to be treated with Excision with a Mohs Surgeon .  We will refer to Dr. Jeannine Boga at [facility]

## 2022-11-05 NOTE — Telephone Encounter (Signed)
I spoke with her. She is aware of results and that she is being referred for Mohs surgery.

## 2022-11-10 ENCOUNTER — Encounter: Payer: Self-pay | Admitting: Dermatology

## 2022-11-16 ENCOUNTER — Telehealth: Payer: Self-pay | Admitting: Neurology

## 2022-11-16 NOTE — Telephone Encounter (Signed)
Pt stated she needs to cancel sleep study for 7/14 and 7/15. Stated something came up and she will be out of town. Pt requesting a call back to reschedule.

## 2022-11-16 NOTE — Telephone Encounter (Signed)
Noted, please see tele phone note from 07/27/22 sent a patient a mychart message

## 2022-11-18 DIAGNOSIS — M5416 Radiculopathy, lumbar region: Secondary | ICD-10-CM | POA: Diagnosis not present

## 2022-11-25 DIAGNOSIS — C44722 Squamous cell carcinoma of skin of right lower limb, including hip: Secondary | ICD-10-CM | POA: Diagnosis not present

## 2022-11-25 DIAGNOSIS — C44719 Basal cell carcinoma of skin of left lower limb, including hip: Secondary | ICD-10-CM | POA: Diagnosis not present

## 2022-11-27 ENCOUNTER — Ambulatory Visit (HOSPITAL_BASED_OUTPATIENT_CLINIC_OR_DEPARTMENT_OTHER): Payer: Medicare HMO | Admitting: Physician Assistant

## 2022-11-30 ENCOUNTER — Ambulatory Visit (HOSPITAL_BASED_OUTPATIENT_CLINIC_OR_DEPARTMENT_OTHER): Payer: Medicare HMO | Admitting: Student

## 2022-11-30 ENCOUNTER — Encounter (HOSPITAL_BASED_OUTPATIENT_CLINIC_OR_DEPARTMENT_OTHER): Payer: Self-pay

## 2022-12-22 ENCOUNTER — Ambulatory Visit: Payer: Medicare HMO | Admitting: Sports Medicine

## 2022-12-22 VITALS — BP 126/80 | Ht 67.0 in | Wt 150.0 lb

## 2022-12-22 DIAGNOSIS — M25552 Pain in left hip: Secondary | ICD-10-CM | POA: Diagnosis not present

## 2022-12-22 NOTE — Progress Notes (Signed)
   Subjective:    Patient ID: Donna [name], female    DOB: May 25, 1950, 72 y.o.   MRN: [id number]  HPI chief complaint: Left hip and thigh pain  Dynisty presents today complaining of 3 weeks of lateral left hip pain that will radiate down the lateral thigh.  It is worse after prolonged sitting.  She denies any groin pain.  Recent x-rays of her left hip showed no significant degenerative changes.  She wonders whether or not some of her exercises at the gym may be contributing.  She has also noticed that her right leg may be a little bit shorter than the left.    Review of Systems As above    Objective:   Physical Exam  Well-developed, well-nourished.  No acute distress  Left hip: Smooth painless hip range of motion with a negative logroll.  There is some tenderness to palpation at the insertion point of the gluteus minimus and gluteus medius tendons.  No tenderness over the greater trochanter.  Slightly positive Trendelenburg.  Left leg is slightly longer than the right both with sitting and lying supine.  Neurovascular intact distally.      Assessment & Plan:   Left hip pain secondary to gluteus medius tendinitis Leg length discrepancy  [facility] at [facility]  We will also try a 3/16 inch heel lift in her right shoe.  She may wean to home exercise program per the therapist discretion.  Follow-up for ongoing or recalcitrant issues.

## 2022-12-28 DIAGNOSIS — C44722 Squamous cell carcinoma of skin of right lower limb, including hip: Secondary | ICD-10-CM | POA: Diagnosis not present

## 2022-12-29 MED ORDER — PRAVASTATIN SODIUM 40 MG PO TABS: 40.0000 mg | ORAL_TABLET | Freq: Every evening | ORAL | 11 refills | Status: DC

## 2023-01-05 DIAGNOSIS — Z48817 Encounter for surgical aftercare following surgery on the skin and subcutaneous tissue: Secondary | ICD-10-CM | POA: Diagnosis not present

## 2023-01-12 DIAGNOSIS — Z1231 Encounter for screening mammogram for malignant neoplasm of breast: Secondary | ICD-10-CM | POA: Diagnosis not present

## 2023-01-12 DIAGNOSIS — M8588 Other specified disorders of bone density and structure, other site: Secondary | ICD-10-CM | POA: Diagnosis not present

## 2023-01-12 DIAGNOSIS — N958 Other specified menopausal and perimenopausal disorders: Secondary | ICD-10-CM | POA: Diagnosis not present

## 2023-01-22 ENCOUNTER — Ambulatory Visit (HOSPITAL_BASED_OUTPATIENT_CLINIC_OR_DEPARTMENT_OTHER): Payer: Medicare HMO | Admitting: Orthopaedic Surgery

## 2023-01-22 DIAGNOSIS — M67952 Unspecified disorder of synovium and tendon, left thigh: Secondary | ICD-10-CM | POA: Diagnosis not present

## 2023-01-22 NOTE — Progress Notes (Signed)
Chief Complaint: Radiculopathy     History of Present Illness:    01/22/2023: Donna [name] presents today for follow-up of her left hip.  She is experiencing pain about the lateral aspect of the hip.  She was told in the past that she did have evidence of gluteus tendinopathy for which she underwent physical therapy and rehabbing both the left and the right side.  She is having a very difficult time getting up stairs as well as laying directly on the side.  She does feel significant weakness on this left side as well.   Donna [name] is a 72 y.o. female presents with left-sided lumbar radiculopathy which be been occurring over the last 20 years.  She was previously diagnosed with lumbar radiculitis.  At that time multiple sessions of physical therapy were introduced.  She is hoping to defer any type of injection.  She has previously worked as a Engineer, civil (consulting).  She is here today for further discussion    Surgical History:   Negative  PMH/PSH/Family History/Social History/Meds/Allergies:    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   chest - CX3 + 5FU   Squamous cell carcinoma of skin 09/27/2014   left thigh - tx p bx   Substance abuse (HCC)    Past Surgical History:  Procedure Laterality Date   ANKLE SURGERY     Bilateral    CATARACT EXTRACTION Bilateral    CERVICAL SPINE SURGERY  07/21/2021   TUBAL LIGATION     Social History   Socioeconomic History    Marital status: Married    Spouse name: Molly Maduro   Number of children: 0   Years of education: Not on file   Highest education level: Associate degree: academic program  Occupational History   Occupation: [employer]  Tobacco Use   Smoking status: Former    Current packs/day: 0.00    Types: Cigarettes    Start date: 10/16/1966    Quit date: 10/15/1996    Years since quitting: 26.2   Smokeless tobacco: Never  Vaping Use   Vaping status: Never Used  Substance and Sexual Activity   Alcohol use: No   Drug use: No   Sexual activity: Yes  Other Topics Concern   Not on file  Social History Narrative   Lives with husband   Right handed   1-2 caffeine drinks daily   Social Determinants of Health   Financial Resource Strain: Low Risk  (10/26/2021)   Received from [facility], [facility]   Overall Financial Resource Strain (CARDIA)    Difficulty of Paying Living Expenses: Not very hard  Food Insecurity: Patient Declined (10/26/2021)   Received from [facility], [facility]  Hunger Vital Sign    Worried About Running Out of Food in the Last Year: Patient declined    Ran Out of Food in the Last Year: Patient declined  Transportation Needs: Not on file  Physical Activity: Sufficiently Active (10/26/2021)   Received from [facility], [facility]   Exercise Vital Sign    Days of Exercise per Week: 5 days    Minutes of Exercise per Session: 90 min  Stress: Stress Concern Present (10/26/2021)   Received from [facility], [facility] of Occupational Health - Occupational Stress Questionnaire    Feeling of Stress : To some extent  Social Connections: Unknown (11/07/2022)   Received from [facility], [facility]   Social Network    Social Network: Not on file   Family History  Problem Relation Age of Onset   Dementia Mother    Diabetes Mother    Melanoma Father    Colon cancer Father 14       Survived   Heart disease Father    Heart attack Father     Rheum arthritis Father    Dementia Father    Healthy Sister    Healthy Brother    Dementia Maternal Aunt    Dementia Maternal Uncle    Colon polyps Paternal Aunt    Pancreatic cancer Paternal Aunt    Rectal cancer Paternal Aunt    Colon cancer Paternal Aunt    Colon cancer Paternal Grandmother    Breast cancer Maternal Grandmother    Stomach cancer Neg Hx    Esophageal cancer Neg Hx    Liver cancer Neg Hx    Allergies  Allergen Reactions   Ciprofloxacin     Neuropathy    Doxycycline Nausea Only   Gluten Meal     GI upset    Incruse Ellipta [Umeclidinium Bromide]     Urinary retention   Penicillins Nausea And Vomiting    "breaking out"   Quinolones Other (See Comments)    REACTION: sick / neuropathy    Current Outpatient Medications  Medication Sig Dispense Refill   albuterol (VENTOLIN HFA) 108 (90 Base) MCG/ACT inhaler Inhale 2 puffs into the lungs every 4 (four) hours as needed.     b complex vitamins tablet Take 1 tablet by mouth daily.       budesonide-formoterol (SYMBICORT) 80-4.5 MCG/ACT inhaler Inhale 2 puffs into the lungs every 6 (six) hours as needed. 1 each 2   calcium gluconate 500 MG tablet Take 500 mg by mouth daily.     estradiol (ESTRACE) 0.1 MG/GM vaginal cream SMARTSIG:1 Applicator Vaginal Once a Week     lisinopril (ZESTRIL) 5 MG tablet TAKE 1 TABLET BY MOUTH DAILY FOR ELEVATED BP WHILE TRAVELING 90 tablet 1   Loratadine (CLARITIN PO) Take by mouth.     Magnesium 500 MG TABS Take 1 tablet by mouth daily.       Multiple Vitamin (MULTIVITAMIN) capsule Take 1 capsule by mouth daily.       pravastatin (PRAVACHOL) 40 MG tablet Take 1 tablet (40 mg total) by mouth every evening. 30 tablet 11   propranolol (INDERAL) 10 MG tablet Take 10 mg by mouth as needed.     valACYclovir (VALTREX) 500 MG tablet ONE BY MOUTH TWICE A DAY X 5 DAYS FOR HERPES OUTBREAK 10 tablet prn   Current Facility-Administered Medications  Medication Dose Route Frequency Provider Last  Rate Last Admin   0.9 %  sodium chloride infusion  500 mL Intravenous Continuous Meryl Dare, MD       No results found.  Review of Systems:   A ROS was performed including pertinent positives and negatives as documented in the HPI.  Physical Exam :   Constitutional: NAD and appears stated age Neurological: Alert and oriented Psych: Appropriate affect and cooperative There were no vitals taken for this visit.   Comprehensive Musculoskeletal Exam:    Positive straight leg raise on the left without any obvious weakness in any nerve distribution  She has tenderness about the greater trochanter with radiation to the left hip.  Internal as well as external rotation of the left hip is 30 degrees smoothly without pain.  Actively flexes the hip to 90 degrees.  Walks without any type of Trendelenburg  Imaging:    Lumbar spine MRI: There is evidence of an L4-L5 paracentral disc herniation I personally reviewed and interpreted the radiographs.   Assessment:   72 y.o. female with left lateral hip pain consistent with possible gluteus medius tendinopathy.  This time she has previously trialed an extensive physical therapy program for strengthening.  She would like to defer an injection at this time as she is quite concerned about the potential effect on the quality of the tendon.  Given that I do agree that an MRI of the left hip would be helpful in assessing for a possible underlying tear of the hip abductor tendons. Plan :    -Plan from my left hip and follow-up to discuss results     I personally saw and evaluated the patient, and participated in the management and treatment plan.  Huel Cote, MD Attending Physician, Orthopedic Surgery  This document was dictated using Dragon voice recognition software. A reasonable attempt at proof reading has been made to minimize errors.

## 2023-01-25 DIAGNOSIS — H532 Diplopia: Secondary | ICD-10-CM | POA: Diagnosis not present

## 2023-01-25 DIAGNOSIS — H52203 Unspecified astigmatism, bilateral: Secondary | ICD-10-CM | POA: Diagnosis not present

## 2023-01-25 DIAGNOSIS — H43813 Vitreous degeneration, bilateral: Secondary | ICD-10-CM | POA: Diagnosis not present

## 2023-01-25 DIAGNOSIS — G43909 Migraine, unspecified, not intractable, without status migrainosus: Secondary | ICD-10-CM | POA: Diagnosis not present

## 2023-01-27 DIAGNOSIS — Z48817 Encounter for surgical aftercare following surgery on the skin and subcutaneous tissue: Secondary | ICD-10-CM | POA: Diagnosis not present

## 2023-01-27 DIAGNOSIS — C44719 Basal cell carcinoma of skin of left lower limb, including hip: Secondary | ICD-10-CM | POA: Diagnosis not present

## 2023-02-02 ENCOUNTER — Encounter (HOSPITAL_BASED_OUTPATIENT_CLINIC_OR_DEPARTMENT_OTHER): Payer: Self-pay | Admitting: Orthopaedic Surgery

## 2023-02-03 ENCOUNTER — Ambulatory Visit (HOSPITAL_BASED_OUTPATIENT_CLINIC_OR_DEPARTMENT_OTHER): Payer: Medicare HMO | Admitting: Orthopaedic Surgery

## 2023-02-03 NOTE — Telephone Encounter (Signed)
Started new case with [employer] for an updated approval for NPSG/MSLT.

## 2023-02-07 ENCOUNTER — Ambulatory Visit
Admission: RE | Admit: 2023-02-07 | Discharge: 2023-02-07 | Disposition: A | Discharge status: Home / Self Care | Payer: Medicare HMO | Source: Ambulatory Visit | Attending: Orthopaedic Surgery | Admitting: Orthopaedic Surgery

## 2023-02-07 DIAGNOSIS — M67952 Unspecified disorder of synovium and tendon, left thigh: Secondary | ICD-10-CM

## 2023-02-07 DIAGNOSIS — M25552 Pain in left hip: Secondary | ICD-10-CM | POA: Diagnosis not present

## 2023-02-07 DIAGNOSIS — Z96643 Presence of artificial hip joint, bilateral: Secondary | ICD-10-CM | POA: Diagnosis not present

## 2023-02-07 DIAGNOSIS — G8929 Other chronic pain: Secondary | ICD-10-CM | POA: Diagnosis not present

## 2023-02-08 NOTE — Telephone Encounter (Signed)
Updated insurance auth:  NPSG/MSLT [employer] medicare auth: [id number] (exp. 02/03/23 to 08/04/23) & [id number] (exp. 02/03/23 to 08/04/23)   Patient is scheduled at [facility] for 03/14/2023 at 03/15/2023.

## 2023-02-19 ENCOUNTER — Ambulatory Visit (HOSPITAL_BASED_OUTPATIENT_CLINIC_OR_DEPARTMENT_OTHER): Payer: Medicare HMO | Admitting: Orthopaedic Surgery

## 2023-02-19 DIAGNOSIS — M7632 Iliotibial band syndrome, left leg: Secondary | ICD-10-CM | POA: Diagnosis not present

## 2023-02-19 DIAGNOSIS — M67952 Unspecified disorder of synovium and tendon, left thigh: Secondary | ICD-10-CM

## 2023-02-19 NOTE — Progress Notes (Signed)
Chief Complaint: Radiculopathy     History of Present Illness:    02/19/2023: Donna [name] presents today for follow-up of her left hip.  She is here today for MRI discussion.  She is still having IT band second symptoms with tightness about the lateral aspect of the thigh   Donna [name] is a 72 y.o. female presents with left-sided lumbar radiculopathy which be been occurring over the last 20 years.  She was previously diagnosed with lumbar radiculitis.  At that time multiple sessions of physical therapy were introduced.  She is hoping to defer any type of injection.  She has previously worked as a Engineer, civil (consulting).  She is here today for further discussion    Surgical History:   Negative  PMH/PSH/Family History/Social History/Meds/Allergies:    Past Medical History:  Diagnosis Date   Anxiety    Asthma    Basal cell carcinoma 06/29/2017   right lip - CX3 + 5FU   BCC (basal cell carcinoma of skin) 10/29/2022   left lower leg, posterior   Cataract    bilateral lens implants   Cough variant asthma    Depression    GERD (gastroesophageal reflux disease)    Heart murmur    Hiatal hernia    Hyperlipidemia    IBS (irritable bowel syndrome)    SCC (squamous cell carcinoma) 10/29/2022   right lower leg, anterior - Mohs 12/28/2022 Dr Daphine Deutscher   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   chest - CX3 + 5FU   Squamous cell carcinoma of skin 09/27/2014   left thigh - tx p bx   Substance abuse (HCC)    Past Surgical History:  Procedure Laterality Date   ANKLE SURGERY     Bilateral    CATARACT EXTRACTION Bilateral    CERVICAL SPINE SURGERY  07/21/2021   TUBAL LIGATION     Social History   Socioeconomic History   Marital status: Married    Spouse name: Molly Maduro   Number of children: 0   Years of education: Not on file   Highest education level: Associate degree: academic program  Occupational History   Occupation: [employer]   Tobacco Use   Smoking status: Former    Current packs/day: 0.00    Types: Cigarettes    Start date: 10/16/1966    Quit date: 10/15/1996    Years since quitting: 26.3   Smokeless tobacco: Never  Vaping Use   Vaping status: Never Used  Substance and Sexual Activity   Alcohol use: No   Drug use: No   Sexual activity: Yes  Other Topics Concern   Not on file  Social History Narrative   Lives with husband   Right handed   1-2 caffeine drinks daily   Social Determinants of Health   Financial Resource Strain: Low Risk  (10/26/2021)   Received from [facility], [facility]   Overall Financial Resource Strain (CARDIA)    Difficulty of Paying Living Expenses: Not very hard  Food Insecurity: Patient Declined (10/26/2021)   Received from [facility], [facility]   Hunger Vital Sign    Worried About Running Out of Food in the Last Year: Patient declined    Ran Out of Food in the Last Year: Patient declined  Transportation Needs: Not on file  Physical Activity: Sufficiently  Active (10/26/2021)   Received from [facility], [facility]   Exercise Vital Sign    Days of Exercise per Week: 5 days    Minutes of Exercise per Session: 90 min  Stress: Stress Concern Present (10/26/2021)   Received from [facility], [facility] - Occupational Stress Questionnaire    Feeling of Stress : To some extent  Social Connections: Unknown (11/07/2022)   Received from [facility], [facility]   Social Network    Social Network: Not on file   Family History  Problem Relation Age of Onset   Dementia Mother    Diabetes Mother    Melanoma Father    Colon cancer Father 16       Survived   Heart disease Father    Heart attack Father    Rheum arthritis Father    Dementia Father    Healthy Sister    Healthy Brother    Dementia Maternal Aunt    Dementia Maternal Uncle    Colon polyps Paternal Aunt    Pancreatic cancer Paternal Aunt    Rectal  cancer Paternal Aunt    Colon cancer Paternal Aunt    Colon cancer Paternal Grandmother    Breast cancer Maternal Grandmother    Stomach cancer Neg Hx    Esophageal cancer Neg Hx    Liver cancer Neg Hx    Allergies  Allergen Reactions   Ciprofloxacin     Neuropathy    Doxycycline Nausea Only   Gluten Meal     GI upset    Incruse Ellipta [Umeclidinium Bromide]     Urinary retention   Penicillins Nausea And Vomiting    "breaking out"   Quinolones Other (See Comments)    REACTION: sick / neuropathy    Current Outpatient Medications  Medication Sig Dispense Refill   albuterol (VENTOLIN HFA) 108 (90 Base) MCG/ACT inhaler Inhale 2 puffs into the lungs every 4 (four) hours as needed.     b complex vitamins tablet Take 1 tablet by mouth daily.       budesonide-formoterol (SYMBICORT) 80-4.5 MCG/ACT inhaler Inhale 2 puffs into the lungs every 6 (six) hours as needed. 1 each 2   calcium gluconate 500 MG tablet Take 500 mg by mouth daily.     estradiol (ESTRACE) 0.1 MG/GM vaginal cream SMARTSIG:1 Applicator Vaginal Once a Week     lisinopril (ZESTRIL) 5 MG tablet TAKE 1 TABLET BY MOUTH DAILY FOR ELEVATED BP WHILE TRAVELING 90 tablet 1   Loratadine (CLARITIN PO) Take by mouth.     Magnesium 500 MG TABS Take 1 tablet by mouth daily.       Multiple Vitamin (MULTIVITAMIN) capsule Take 1 capsule by mouth daily.       pravastatin (PRAVACHOL) 40 MG tablet Take 1 tablet (40 mg total) by mouth every evening. 30 tablet 11   propranolol (INDERAL) 10 MG tablet Take 10 mg by mouth as needed.     valACYclovir (VALTREX) 500 MG tablet ONE BY MOUTH TWICE A DAY X 5 DAYS FOR HERPES OUTBREAK 10 tablet prn   Current Facility-Administered Medications  Medication Dose Route Frequency Provider Last Rate Last Admin   0.9 %  sodium chloride infusion  500 mL Intravenous Continuous Meryl Dare, MD       No results found.  Review of Systems:   A ROS was performed including pertinent positives and negatives  as documented in the HPI.  Physical Exam :  Constitutional: NAD and appears stated age Neurological: Alert and oriented Psych: Appropriate affect and cooperative There were no vitals taken for this visit.   Comprehensive Musculoskeletal Exam:    Positive straight leg raise on the left without any obvious weakness in any nerve distribution  She has tenderness about the greater trochanter with radiation to the left hip.  Internal as well as external rotation of the left hip is 30 degrees smoothly without pain.  Actively flexes the hip to 90 degrees.  Walks without any type of Trendelenburg  Imaging:    Lumbar spine MRI: There is evidence of an L4-L5 paracentral disc herniation I personally reviewed and interpreted the radiographs.  MRI left hip: No evidence of tearing in the abductor mechanism  Assessment:   72 y.o. female with left lateral hip consistent with IT band tendinitis and radiation from this.  At this time I do believe she would benefit from physical therapy for stretching of the IT band as well as strengthening of the abductor mechanism.  We will defer any type of dry needling as she does have a phobia of this.  I will plan to see her back in 2 months for reassessment Plan :    -Return to clinic 2 months for reassessment     I personally saw and evaluated the patient, and participated in the management and treatment plan.  Huel Cote, MD Attending Physician, Orthopedic Surgery  This document was dictated using Dragon voice recognition software. A reasonable attempt at proof reading has been made to minimize errors.

## 2023-02-22 ENCOUNTER — Ambulatory Visit: Payer: Medicare HMO | Attending: Orthopaedic Surgery | Admitting: Physical Therapy

## 2023-02-22 ENCOUNTER — Encounter: Payer: Self-pay | Admitting: Physical Therapy

## 2023-02-22 ENCOUNTER — Other Ambulatory Visit (HOSPITAL_BASED_OUTPATIENT_CLINIC_OR_DEPARTMENT_OTHER): Payer: Self-pay | Admitting: Orthopaedic Surgery

## 2023-02-22 ENCOUNTER — Telehealth (HOSPITAL_BASED_OUTPATIENT_CLINIC_OR_DEPARTMENT_OTHER): Payer: Self-pay | Admitting: Orthopaedic Surgery

## 2023-02-22 DIAGNOSIS — M5459 Other low back pain: Secondary | ICD-10-CM | POA: Diagnosis not present

## 2023-02-22 DIAGNOSIS — M25552 Pain in left hip: Secondary | ICD-10-CM | POA: Diagnosis not present

## 2023-02-22 DIAGNOSIS — M62830 Muscle spasm of back: Secondary | ICD-10-CM | POA: Insufficient documentation

## 2023-02-22 DIAGNOSIS — R262 Difficulty in walking, not elsewhere classified: Secondary | ICD-10-CM | POA: Diagnosis not present

## 2023-02-22 DIAGNOSIS — M7632 Iliotibial band syndrome, left leg: Secondary | ICD-10-CM | POA: Diagnosis not present

## 2023-02-22 NOTE — Telephone Encounter (Signed)
Donna [name] would like her referral sent to [facility] to [facility] because it is closer to her house. [phone]) [phone]

## 2023-02-22 NOTE — Telephone Encounter (Signed)
Referral placed in chart to [facility]

## 2023-02-22 NOTE — Therapy (Signed)
OUTPATIENT PHYSICAL THERAPY THORACOLUMBAR EVALUATION   Patient Name: Donna [name] MRN: [id number] DOB:18-Feb-1951, 72 y.o., female Today's Date: 02/22/2023  END OF SESSION:  PT End of Session - 02/22/23 1400     Visit Number 1    Date for PT Re-Evaluation 05/11/23    Authorization Type [employer], no ionto    PT Start Time 1358    PT Stop Time 1440    PT Time Calculation (min) 42 min    Activity Tolerance Patient tolerated treatment well    Behavior During Therapy WFL for tasks assessed/performed              Past Medical History:  Diagnosis Date   Anxiety    Asthma    Basal cell carcinoma 06/29/2017   right lip - CX3 + 5FU   BCC (basal cell carcinoma of skin) 10/29/2022   left lower leg, posterior   Cataract    bilateral lens implants   Cough variant asthma    Depression    GERD (gastroesophageal reflux disease)    Heart murmur    Hiatal hernia    Hyperlipidemia    IBS (irritable bowel syndrome)    SCC (squamous cell carcinoma) 10/29/2022   right lower leg, anterior - Mohs 12/28/2022 Dr Daphine Deutscher   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   chest - CX3 + 5FU   Squamous cell carcinoma of skin 09/27/2014   left thigh - tx p bx   Substance abuse (HCC)    Past Surgical History:  Procedure Laterality Date   ANKLE SURGERY     Bilateral    CATARACT EXTRACTION Bilateral    CERVICAL SPINE SURGERY  07/21/2021   TUBAL LIGATION     Patient Active Problem List   Diagnosis Date Noted   Gasping for breath 08/19/2021   PTSD (post-traumatic stress disorder) 08/19/2021   Parasomnia overlap disorder 08/19/2021   Snoring 08/19/2021   Excessive daytime sleepiness 08/19/2021   Sleep paralysis, recurrent isolated 08/19/2021   Asthma 02/19/2021   Multiple drug allergies 02/19/2021   Gluten intolerance 02/19/2021   Chronic rhinitis 02/19/2021   Metatarsalgia of left foot 11/14/2020   Achilles tendinosis of right lower extremity 11/14/2020    Essential tremor 07/04/2019   DOE (dyspnea on exertion) 01/20/2019   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 06/13/2018   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   Superficial basal cell carcinoma 06/13/2018   Upper airway cough syndrome 06/13/2018   Atrophic vaginitis 02/17/2018   Bilateral leg cramps 02/17/2018   Fatty liver, alcoholic 04/22/2016   Depression 04/19/2016   Withdrawal complaint 04/19/2016   Hx of adenomatous colonic polyps 08/03/2015   Herpes simplex 06/15/2014   GERD (gastroesophageal reflux disease) 10/14/2013   Anxiety state 10/14/2013   Recovering alcoholic in remission (HCC) 10/14/2013   Allergic rhinitis 10/14/2013   Adjustment disorder with anxiety 10/14/2013   Elevated low-density lipoprotein level 10/14/2013   Essential hypertension 10/15/2012   Heart murmur 05/25/2012    PCP: Irena Reichmann, DO  REFERRING PROVIDER: Steward Drone, MD  REFERRING DIAG: Back pain, ITB syndrome  Rationale for Evaluation and Treatment: Rehabilitation  THERAPY DIAG:  Other low back pain  Muscle spasm of back  Pain in left hip  ONSET DATE: 02/18/23  SUBJECTIVE:  SUBJECTIVE STATEMENT: Patient with left hip pain since July, no known cause, crossing left leg over the right really hurts, getting up from sitting feels like the thigh "will break"recent MRI below, MD feels HS tendonopathy and ITB tightness  PERTINENT HISTORY:  Has concussion currently, has anteiolisthesis in cervical  PAIN:  Are you having pain? Yes 2/10, left anterior thigh, worse with crossing left leg over and with getting up from sitting , pain up to 10/10 walking around pain will subside to 2/10.    PRECAUTIONS: None  WEIGHT BEARING RESTRICTIONS: No  FALLS:  Has patient fallen in  last 6 months? Yes 1 fall in September onto the left side, sitting to drive made the left leg hurt  LIVING ENVIRONMENT: Lives with: lives with their family and lives with their spouse Lives in: House/apartment Stairs: No Has following equipment at home: None  OCCUPATION: yoga instructor  PLOF: Independent and daily yoga, yardwork, housework PATIENT GOALS: have less pain  NEXT MD VISIT:  couple of months OBJECTIVE:   DIAGNOSTIC FINDINGS:  IMPRESSION: 1. Unchanged lumbar disc and facet degeneration without significant spinal stenosis. 2. Mild right neural foraminal stenosis at L4-5.  IMPRESSION: 1. Mild to moderate degenerative hip arthropathy bilaterally. 2. Suspected degeneration of the anterior superior labrum although a well-defined tear is not observed. 3. Symmetric proximal hamstring tendinopathy.  SCREENING FOR RED FLAGS: Bowel or bladder incontinence: No Spinal tumors: No Cauda equina syndrome: No Compression fracture: No Abdominal aneurysm: No  COGNITION: Overall cognitive status: Within functional limits for tasks assessed     SENSATION: WFL  MUSCLE LENGTH: Good HS,very tight quad, piriformis, tight and painful ITB  POSTURE: decreased thoracic kyphosis  PALPATION: Very tight and tender in the left quad, ITB, vastus lateralis and into the left buttock  LUMBAR ROM:   AROM eval  Flexion WNL's  Extension Decreased 25%  Right lateral flexion Decreased 50%  Left lateral flexion Decreased 100% P!  Right rotation Decreased 50%  Left rotation Decreased 50%   (Blank rows = not tested)  LOWER EXTREMITY ROM:   WFL LOWER EXTREMITY MMT:   left hip 4-/5   SPECIAL TESTS:  Slump test: Negative and Long sit test: Positive  GAIT:  mild antalgic on the left when she gets up  TODAY'S TREATMENT:                                                                                                                              DATE:     PATIENT EDUCATION:  Education  details: POC and HEP Person educated: Patient Education method: Programmer, multimedia, Demonstration, Tactile cues, Verbal cues, and Handouts Education comprehension: verbalized understanding, returned demonstration, verbal cues required, and tactile cues required  HOME EXERCISE PROGRAM:  ASSESSMENT:  CLINICAL IMPRESSION: Patient is a 72 y.o. female who was seen today for physical therapy evaluation and treatment for left hip, thigh pain.  She had recent MRI of the back and the hip as noted  above.  Very tight left leg mms, weak core, when she crosses the left leg over this causes a lot of pain, sitting for periods of time and then getting up causes a lot of pain.    OBJECTIVE IMPAIRMENTS: decreased activity tolerance, decreased mobility, difficulty walking, decreased ROM, decreased strength, increased muscle spasms, impaired flexibility, improper body mechanics, postural dysfunction, and pain.   REHAB POTENTIAL: Good  CLINICAL DECISION MAKING: Stable/uncomplicated  EVALUATION COMPLEXITY: Low   GOALS: Goals reviewed with patient? Yes  SHORT TERM GOALS: Target date: 05/08/23  Independent with initial HEP Goal status: INITIAL  LONG TERM GOALS: Target date: 05/11/23  Independent with advanced HEP and gym Goal status: INITIAL  2.  Report pain decreased overall 50% Goal status: INITIAL  3.  Increase lumbar ROM 25% Goal status: INITIAL  4. Increase left hip strength to 4+/5 Goal status: INITIAL PLAN:  PT FREQUENCY: 1x/week  PT DURATION: 12 weeks  PLANNED INTERVENTIONS: Therapeutic exercises, Therapeutic activity, Neuromuscular re-education, Balance training, Gait training, Patient/Family education, Self Care, Joint mobilization, Dry Needling, Electrical stimulation, Spinal mobilization, Moist heat, Traction, and Manual therapy.  PLAN FOR NEXT SESSION: STM tot he left buttock, ITB, vastus lateralis, TFL, quad, glutes, flexibility if she tolerates   Jearld Lesch,  PT 02/22/2023, 2:01 PM

## 2023-02-23 DIAGNOSIS — Z48817 Encounter for surgical aftercare following surgery on the skin and subcutaneous tissue: Secondary | ICD-10-CM | POA: Diagnosis not present

## 2023-02-24 ENCOUNTER — Ambulatory Visit (HOSPITAL_BASED_OUTPATIENT_CLINIC_OR_DEPARTMENT_OTHER): Payer: Medicare HMO | Admitting: Orthopaedic Surgery

## 2023-03-01 ENCOUNTER — Ambulatory Visit: Payer: Medicare HMO | Admitting: Dermatology

## 2023-03-03 ENCOUNTER — Encounter: Payer: Self-pay | Admitting: Physical Therapy

## 2023-03-03 ENCOUNTER — Ambulatory Visit: Payer: Medicare HMO | Admitting: Physical Therapy

## 2023-03-03 DIAGNOSIS — M25552 Pain in left hip: Secondary | ICD-10-CM

## 2023-03-03 DIAGNOSIS — M7632 Iliotibial band syndrome, left leg: Secondary | ICD-10-CM | POA: Diagnosis not present

## 2023-03-03 DIAGNOSIS — M62830 Muscle spasm of back: Secondary | ICD-10-CM | POA: Diagnosis not present

## 2023-03-03 DIAGNOSIS — M5459 Other low back pain: Secondary | ICD-10-CM

## 2023-03-03 DIAGNOSIS — R262 Difficulty in walking, not elsewhere classified: Secondary | ICD-10-CM | POA: Diagnosis not present

## 2023-03-03 NOTE — Therapy (Signed)
OUTPATIENT PHYSICAL THERAPY THORACOLUMBAR EVALUATION   Patient Name: Donna [name] MRN: [id number] DOB:[birth year], 72 y.o., female Today's Date: 03/03/2023  END OF SESSION:  PT End of Session - 03/03/23 1741     Visit Number 2    Date for PT Re-Evaluation 05/11/23    Authorization Type Aetna, no ionto    PT Start Time 1700    PT Stop Time 1742    PT Time Calculation (min) 42 min    Activity Tolerance Patient tolerated treatment well    Behavior During Therapy WFL for tasks assessed/performed              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   chest - CX3 + 5FU   Squamous cell carcinoma of skin 09/27/2014   left thigh - tx p bx   Substance abuse (HCC)    Past Surgical History:  Procedure Laterality Date   ANKLE SURGERY     Bilateral    CATARACT EXTRACTION Bilateral    CERVICAL SPINE SURGERY  07/21/2021   TUBAL LIGATION     Patient Active Problem List   Diagnosis Date Noted   Gasping for breath 08/19/2021   PTSD (post-traumatic stress disorder) 08/19/2021   Parasomnia overlap disorder 08/19/2021   Snoring 08/19/2021   Excessive daytime sleepiness 08/19/2021   Sleep paralysis, recurrent isolated 08/19/2021   Asthma 02/19/2021   Multiple drug allergies 02/19/2021   Gluten intolerance 02/19/2021   Chronic rhinitis 02/19/2021   Metatarsalgia of left foot 11/14/2020   Achilles tendinosis of right lower extremity  11/14/2020   Essential tremor 07/04/2019   DOE (dyspnea on exertion) 01/20/2019   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 06/13/2018   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   Superficial basal cell carcinoma 06/13/2018   Upper airway cough syndrome 06/13/2018   Atrophic vaginitis 02/17/2018   Bilateral leg cramps 02/17/2018   Fatty liver, alcoholic 04/22/2016   Depression 04/19/2016   Withdrawal complaint 04/19/2016   Hx of adenomatous colonic polyps 08/03/2015   Herpes simplex 06/15/2014   GERD (gastroesophageal reflux disease) 10/14/2013   Anxiety state 10/14/2013   Recovering alcoholic in remission (HCC) 10/14/2013   Allergic rhinitis 10/14/2013   Adjustment disorder with anxiety 10/14/2013   Elevated low-density lipoprotein level 10/14/2013   Essential hypertension 10/15/2012   Heart murmur 05/25/2012    PCP: Irena Reichmann, DO  REFERRING PROVIDER: Steward Drone, MD  REFERRING DIAG: Back pain, ITB syndrome  Rationale for Evaluation and Treatment: Rehabilitation  THERAPY DIAG:  Other low back pain  Muscle spasm of back  Pain in left hip  Difficulty in walking, not elsewhere classified  ONSET DATE: 02/18/23  SUBJECTIVE:  SUBJECTIVE STATEMENT: Patient with left hip pain since July, no known cause, crossing left leg over the right really hurts, getting up from sitting feels like the thigh "will break"recent MRI below, MD feels HS tendonopathy and ITB tightness, reports a little better after starting the HEP and got a theragun but has not used it yet  PERTINENT HISTORY:  Has concussion currently, has anteiolisthesis in cervical  PAIN:  Are you having pain? Yes 2/10, left anterior thigh, worse with crossing left leg over and with getting up from  sitting , pain up to 10/10 walking around pain will subside to 2/10.    PRECAUTIONS: None  WEIGHT BEARING RESTRICTIONS: No  FALLS:  Has patient fallen in last 6 months? Yes 1 fall in September onto the left side, sitting to drive made the left leg hurt  LIVING ENVIRONMENT: Lives with: lives with their family and lives with their spouse Lives in: House/apartment Stairs: No Has following equipment at home: None  OCCUPATION: yoga instructor  PLOF: Independent and daily yoga, yardwork, housework PATIENT GOALS: have less pain  NEXT MD VISIT:  couple of months OBJECTIVE:   DIAGNOSTIC FINDINGS:  IMPRESSION: 1. Unchanged lumbar disc and facet degeneration without significant spinal stenosis. 2. Mild right neural foraminal stenosis at L4-5.  IMPRESSION: 1. Mild to moderate degenerative hip arthropathy bilaterally. 2. Suspected degeneration of the anterior superior labrum although a well-defined tear is not observed. 3. Symmetric proximal hamstring tendinopathy.  SCREENING FOR RED FLAGS: Bowel or bladder incontinence: No Spinal tumors: No Cauda equina syndrome: No Compression fracture: No Abdominal aneurysm: No  COGNITION: Overall cognitive status: Within functional limits for tasks assessed     SENSATION: WFL  MUSCLE LENGTH: Good HS,very tight quad, piriformis, tight and painful ITB  POSTURE: decreased thoracic kyphosis  PALPATION: Very tight and tender in the left quad, ITB, vastus lateralis and into the left buttock  LUMBAR ROM:   AROM eval  Flexion WNL's  Extension Decreased 25%  Right lateral flexion Decreased 50%  Left lateral flexion Decreased 100% P!  Right rotation Decreased 50%  Left rotation Decreased 50%   (Blank rows = not tested)  LOWER EXTREMITY ROM:   WFL LOWER EXTREMITY MMT:   left hip 4-/5   SPECIAL TESTS:  Slump test: Negative and Long sit test: Positive  GAIT:  mild antalgic on the left when she gets up  TODAY'S TREATMENT:                                                                                                                               DATE:   03/03/23 Passive HS, piriformis stretch, could not tolerate passive ITB stretch, did this in standing Supine green tband left hip abduction Supine green tband left hip adduction Supine left hip extension green tband Seated red tband ball b/n knees ER and IR of the hips STM with hands and with t-gun to the left ITB, vastus lateralis  PATIENT EDUCATION:  Education details: POC and HEP Person educated: Patient Education method: Explanation, Demonstration, Tactile cues, Verbal cues, and Handouts Education comprehension: verbalized understanding, returned demonstration, verbal cues required, and tactile cues required  HOME EXERCISE PROGRAM:  ASSESSMENT:  CLINICAL IMPRESSION: Patient is a 72 y.o. female who was seen today for physical therapy evaluation and treatment for left hip, thigh pain.  She had recent MRI of the back and the hip as noted above.  Very tight left leg mms, weak core, when she crosses the left leg over this causes a lot of pain, sitting for periods of time and then getting up causes a lot of pain.  I initiated some strengthening exercises and tried some STM with hands and with t-gun, very tender in the ITB, again if on the right side hs to have a pillow b/n knees due to the pain in the ITB with the slight strain on it  OBJECTIVE IMPAIRMENTS: decreased activity tolerance, decreased mobility, difficulty walking, decreased ROM, decreased strength, increased muscle spasms, impaired flexibility, improper body mechanics, postural dysfunction, and pain.   REHAB POTENTIAL: Good  CLINICAL DECISION MAKING: Stable/uncomplicated  EVALUATION COMPLEXITY: Low   GOALS: Goals reviewed with patient? Yes  SHORT TERM GOALS: Target date: 05/08/23  Independent with initial HEP Goal status: met 03/03/23  LONG TERM GOALS: Target date: 05/11/23  Independent with  advanced HEP and gym Goal status: INITIAL  2.  Report pain decreased overall 50% Goal status: INITIAL  3.  Increase lumbar ROM 25% Goal status: INITIAL  4. Increase left hip strength to 4+/5 Goal status: INITIAL PLAN:  PT FREQUENCY: 1x/week  PT DURATION: 12 weeks  PLANNED INTERVENTIONS: Therapeutic exercises, Therapeutic activity, Neuromuscular re-education, Balance training, Gait training, Patient/Family education, Self Care, Joint mobilization, Dry Needling, Electrical stimulation, Spinal mobilization, Moist heat, Traction, and Manual therapy.  PLAN FOR NEXT SESSION: STM tot he left buttock, ITB, vastus lateralis, TFL, quad, glutes, flexibility if she tolerates   Jearld Lesch, PT 03/03/2023, 5:42 PM

## 2023-03-04 DIAGNOSIS — M8580 Other specified disorders of bone density and structure, unspecified site: Secondary | ICD-10-CM | POA: Diagnosis not present

## 2023-03-08 ENCOUNTER — Ambulatory Visit: Payer: Medicare HMO | Admitting: Physical Therapy

## 2023-03-08 ENCOUNTER — Encounter: Payer: Self-pay | Admitting: Physical Therapy

## 2023-03-08 DIAGNOSIS — M25552 Pain in left hip: Secondary | ICD-10-CM | POA: Diagnosis not present

## 2023-03-08 DIAGNOSIS — M5459 Other low back pain: Secondary | ICD-10-CM | POA: Diagnosis not present

## 2023-03-08 DIAGNOSIS — M62830 Muscle spasm of back: Secondary | ICD-10-CM | POA: Diagnosis not present

## 2023-03-08 DIAGNOSIS — R262 Difficulty in walking, not elsewhere classified: Secondary | ICD-10-CM

## 2023-03-08 DIAGNOSIS — M7632 Iliotibial band syndrome, left leg: Secondary | ICD-10-CM | POA: Diagnosis not present

## 2023-03-08 NOTE — Therapy (Signed)
OUTPATIENT PHYSICAL THERAPY THORACOLUMBAR TREATMENT   Patient Name: Donna [name] MRN: [id number] DOB:[birth year], 72 y.o., female Today's Date: 03/08/2023  END OF SESSION:  PT End of Session - 03/08/23 0919     Visit Number 3    Date for PT Re-Evaluation 05/11/23    Authorization Type [employer], no ionto    PT Start Time 0920    PT Stop Time 1010    PT Time Calculation (min) 50 min    Activity Tolerance Patient tolerated treatment well    Behavior During Therapy [facility] for tasks assessed/performed              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   chest - CX3 + 5FU   Squamous cell carcinoma of skin 09/27/2014   left thigh - tx p bx   Substance abuse (HCC)    Past Surgical History:  Procedure Laterality Date   ANKLE SURGERY     Bilateral    CATARACT EXTRACTION Bilateral    CERVICAL SPINE SURGERY  07/21/2021   TUBAL LIGATION     Patient Active Problem List   Diagnosis Date Noted   Gasping for breath 08/19/2021   PTSD (post-traumatic stress disorder) 08/19/2021   Parasomnia overlap disorder 08/19/2021   Snoring 08/19/2021   Excessive daytime sleepiness 08/19/2021   Sleep paralysis, recurrent isolated 08/19/2021   Asthma 02/19/2021   Multiple drug allergies 02/19/2021   Gluten intolerance 02/19/2021   Chronic rhinitis 02/19/2021   Metatarsalgia of left foot 11/14/2020   Achilles tendinosis of right lower extremity  11/14/2020   Essential tremor 07/04/2019   DOE (dyspnea on exertion) 01/20/2019   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 06/13/2018   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   Superficial basal cell carcinoma 06/13/2018   Upper airway cough syndrome 06/13/2018   Atrophic vaginitis 02/17/2018   Bilateral leg cramps 02/17/2018   Fatty liver, alcoholic 04/22/2016   Depression 04/19/2016   Withdrawal complaint 04/19/2016   Hx of adenomatous colonic polyps 08/03/2015   Herpes simplex 06/15/2014   GERD (gastroesophageal reflux disease) 10/14/2013   Anxiety state 10/14/2013   Recovering alcoholic in remission (HCC) 10/14/2013   Allergic rhinitis 10/14/2013   Adjustment disorder with anxiety 10/14/2013   Elevated low-density lipoprotein level 10/14/2013   Essential hypertension 10/15/2012   Heart murmur 05/25/2012    PCP: Irena Reichmann, DO  REFERRING PROVIDER: Steward Drone, MD  REFERRING DIAG: Back pain, ITB syndrome  Rationale for Evaluation and Treatment: Rehabilitation  THERAPY DIAG:  Other low back pain  Muscle spasm of back  Pain in left hip  Difficulty in walking, not elsewhere classified  ONSET DATE: 02/18/23  SUBJECTIVE:  SUBJECTIVE STATEMENT: Patient with left hip pain since July, no known cause, crossing left leg over the right really hurts, getting up from sitting feels like the thigh "will break"recent MRI below, MD feels HS tendonopathy and ITB tightness, reports that she is walking a little better, still a lot of pain when getting up from sitting   PERTINENT HISTORY:  Has concussion currently, has anteiolisthesis in cervical  PAIN:  Are you having pain? Yes 2/10, left anterior thigh, worse with crossing left leg over and with getting up from  sitting , pain up to 10/10 walking around pain will subside to 2/10.    PRECAUTIONS: None  WEIGHT BEARING RESTRICTIONS: No  FALLS:  Has patient fallen in last 6 months? Yes 1 fall in September onto the left side, sitting to drive made the left leg hurt  LIVING ENVIRONMENT: Lives with: lives with their family and lives with their spouse Lives in: House/apartment Stairs: No Has following equipment at home: None  OCCUPATION: yoga instructor  PLOF: Independent and daily yoga, yardwork, housework PATIENT GOALS: have less pain  NEXT MD VISIT:  couple of months OBJECTIVE:   DIAGNOSTIC FINDINGS:  IMPRESSION: 1. Unchanged lumbar disc and facet degeneration without significant spinal stenosis. 2. Mild right neural foraminal stenosis at L4-5.  IMPRESSION: 1. Mild to moderate degenerative hip arthropathy bilaterally. 2. Suspected degeneration of the anterior superior labrum although a well-defined tear is not observed. 3. Symmetric proximal hamstring tendinopathy.  SCREENING FOR RED FLAGS: Bowel or bladder incontinence: No Spinal tumors: No Cauda equina syndrome: No Compression fracture: No Abdominal aneurysm: No  COGNITION: Overall cognitive status: Within functional limits for tasks assessed     SENSATION: WFL  MUSCLE LENGTH: Good HS,very tight quad, piriformis, tight and painful ITB  POSTURE: decreased thoracic kyphosis  PALPATION: Very tight and tender in the left quad, ITB, vastus lateralis and into the left buttock  LUMBAR ROM:   AROM eval  Flexion WNL's  Extension Decreased 25%  Right lateral flexion Decreased 50%  Left lateral flexion Decreased 100% P!  Right rotation Decreased 50%  Left rotation Decreased 50%   (Blank rows = not tested)  LOWER EXTREMITY ROM:   WFL LOWER EXTREMITY MMT:   left hip 4-/5   SPECIAL TESTS:  Slump test: Negative and Long sit test: Positive  GAIT:  mild antalgic on the left when she gets up  TODAY'S TREATMENT:                                                                                                                               DATE:   03/08/23 Bike x 4 minutes level 3 LEg curls 20# x10 and then x6 Leg extension 5# x10 and x 6 Leg press 20# x10 and x 6 Resisted gait each side and backward x 3 40# 5# hip extension x10 and x 6  Tmill push fwd and bkwd 15 seconds x 3 each Passive LE stretch focus on  the hip flexor and quad STM to the quad, ITB Manual joint distraction Negative hip scour test  03/03/23 Passive HS, piriformis stretch, could not tolerate passive ITB stretch, did this in standing Supine green tband left hip abduction Supine green tband left hip adduction Supine left hip extension green tband Seated red tband ball b/n knees ER and IR of the hips STM with hands and with t-gun to the left ITB, vastus lateralis  PATIENT EDUCATION:  Education details: POC and HEP Person educated: Patient Education method: Programmer, multimedia, Demonstration, Tactile cues, Verbal cues, and Handouts Education comprehension: verbalized understanding, returned demonstration, verbal cues required, and tactile cues required  HOME EXERCISE PROGRAM:  ASSESSMENT:  CLINICAL IMPRESSION: Patient is a 72 y.o. female who was seen today for physical therapy evaluation and treatment for left hip, thigh pain.  She had recent MRI of the back and the hip as noted above.  Very tight left leg mms, weak core, when she crosses the left leg over this causes a lot of pain, sitting for periods of time and then getting up causes a lot of pain.Today we started exercises in the gym.  She is very weak and we had to do a set of 10 and a set of 6 due to the weakness not pain.  Needs strength  OBJECTIVE IMPAIRMENTS: decreased activity tolerance, decreased mobility, difficulty walking, decreased ROM, decreased strength, increased muscle spasms, impaired flexibility, improper body mechanics, postural dysfunction, and pain.   REHAB POTENTIAL:  Good  CLINICAL DECISION MAKING: Stable/uncomplicated  EVALUATION COMPLEXITY: Low   GOALS: Goals reviewed with patient? Yes  SHORT TERM GOALS: Target date: 05/08/23  Independent with initial HEP Goal status: met 03/03/23  LONG TERM GOALS: Target date: 05/11/23  Independent with advanced HEP and gym Goal status: ongoing 03/08/23  2.  Report pain decreased overall 50% Goal status: INITIAL  3.  Increase lumbar ROM 25% Goal status: ongoing 03/08/23  4. Increase left hip strength to 4+/5 Goal status: INITIAL PLAN:  PT FREQUENCY: 1x/week  PT DURATION: 12 weeks  PLANNED INTERVENTIONS: Therapeutic exercises, Therapeutic activity, Neuromuscular re-education, Balance training, Gait training, Patient/Family education, Self Care, Joint mobilization, Dry Needling, Electrical stimulation, Spinal mobilization, Moist heat, Traction, and Manual therapy.  PLAN FOR NEXT SESSION: STM tot he left buttock, ITB, vastus lateralis, TFL, quad, glutes, flexibility if she tolerates, start to increase strength as tolerated   Lilia Letterman W, PT 03/08/2023, 9:20 AM

## 2023-03-09 ENCOUNTER — Encounter: Payer: Self-pay | Admitting: Neurology

## 2023-03-14 ENCOUNTER — Ambulatory Visit (INDEPENDENT_AMBULATORY_CARE_PROVIDER_SITE_OTHER): Payer: Medicare HMO | Admitting: Neurology

## 2023-03-14 DIAGNOSIS — G4759 Other parasomnia: Secondary | ICD-10-CM

## 2023-03-14 DIAGNOSIS — G4719 Other hypersomnia: Secondary | ICD-10-CM

## 2023-03-14 DIAGNOSIS — G3184 Mild cognitive impairment, so stated: Secondary | ICD-10-CM

## 2023-03-14 DIAGNOSIS — F4310 Post-traumatic stress disorder, unspecified: Secondary | ICD-10-CM

## 2023-03-15 ENCOUNTER — Other Ambulatory Visit: Payer: Self-pay | Admitting: Neurology

## 2023-03-15 ENCOUNTER — Ambulatory Visit: Payer: Medicare HMO | Admitting: Neurology

## 2023-03-15 ENCOUNTER — Other Ambulatory Visit: Payer: Self-pay

## 2023-03-15 DIAGNOSIS — S060XAD Concussion with loss of consciousness status unknown, subsequent encounter: Secondary | ICD-10-CM

## 2023-03-15 DIAGNOSIS — G4719 Other hypersomnia: Secondary | ICD-10-CM | POA: Diagnosis not present

## 2023-03-15 DIAGNOSIS — G4753 Recurrent isolated sleep paralysis: Secondary | ICD-10-CM

## 2023-03-15 DIAGNOSIS — G309 Alzheimer's disease, unspecified: Secondary | ICD-10-CM

## 2023-03-15 DIAGNOSIS — Z79899 Other long term (current) drug therapy: Secondary | ICD-10-CM

## 2023-03-15 DIAGNOSIS — F4310 Post-traumatic stress disorder, unspecified: Secondary | ICD-10-CM

## 2023-03-15 DIAGNOSIS — G4759 Other parasomnia: Secondary | ICD-10-CM

## 2023-03-15 DIAGNOSIS — G3184 Mild cognitive impairment, so stated: Secondary | ICD-10-CM

## 2023-03-17 ENCOUNTER — Ambulatory Visit: Payer: Medicare HMO | Admitting: Physical Therapy

## 2023-03-18 LAB — COMPREHENSIVE DRUG ANALYSIS,UR

## 2023-03-22 ENCOUNTER — Ambulatory Visit: Payer: Medicare HMO | Attending: Orthopaedic Surgery | Admitting: Physical Therapy

## 2023-03-22 ENCOUNTER — Encounter: Payer: Self-pay | Admitting: Physical Therapy

## 2023-03-22 DIAGNOSIS — M62830 Muscle spasm of back: Secondary | ICD-10-CM | POA: Diagnosis not present

## 2023-03-22 DIAGNOSIS — M5459 Other low back pain: Secondary | ICD-10-CM | POA: Insufficient documentation

## 2023-03-22 DIAGNOSIS — M25552 Pain in left hip: Secondary | ICD-10-CM | POA: Diagnosis not present

## 2023-03-22 DIAGNOSIS — R262 Difficulty in walking, not elsewhere classified: Secondary | ICD-10-CM | POA: Diagnosis not present

## 2023-03-22 DIAGNOSIS — M79672 Pain in left foot: Secondary | ICD-10-CM | POA: Diagnosis not present

## 2023-03-22 NOTE — Therapy (Signed)
OUTPATIENT PHYSICAL THERAPY THORACOLUMBAR TREATMENT   Patient Name: Donna [name] MRN: [id number] DOB:1950/11/22, 72 y.o., female Today's Date: 03/22/2023  END OF SESSION:  PT End of Session - 03/22/23 0930     Visit Number 4    Date for PT Re-Evaluation 05/11/23    PT Start Time 0924    PT Stop Time 1005    PT Time Calculation (min) 41 min    Activity Tolerance Patient tolerated treatment well    Behavior During Therapy [facility] for tasks assessed/performed            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   chest - CX3 + 5FU   Squamous cell carcinoma of skin 09/27/2014   left thigh - tx p bx   Substance abuse (HCC)    Past Surgical History:  Procedure Laterality Date   ANKLE SURGERY     Bilateral    CATARACT EXTRACTION Bilateral    CERVICAL SPINE SURGERY  07/21/2021   TUBAL LIGATION     Patient Active Problem List   Diagnosis Date Noted   Gasping for breath 08/19/2021   PTSD (post-traumatic stress disorder) 08/19/2021   Parasomnia overlap disorder 08/19/2021   Snoring 08/19/2021   Excessive daytime sleepiness 08/19/2021   Sleep paralysis, recurrent isolated 08/19/2021   Asthma 02/19/2021   Multiple drug allergies 02/19/2021   Gluten intolerance 02/19/2021   Chronic rhinitis 02/19/2021   Metatarsalgia of left foot 11/14/2020   Achilles tendinosis of right lower extremity 11/14/2020   Essential tremor 07/04/2019   DOE  (dyspnea on exertion) 01/20/2019   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 06/13/2018   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   Superficial basal cell carcinoma 06/13/2018   Upper airway cough syndrome 06/13/2018   Atrophic vaginitis 02/17/2018   Bilateral leg cramps 02/17/2018   Fatty liver, alcoholic 04/22/2016   Depression 04/19/2016   Withdrawal complaint 04/19/2016   Hx of adenomatous colonic polyps 08/03/2015   Herpes simplex 06/15/2014   GERD (gastroesophageal reflux disease) 10/14/2013   Anxiety state 10/14/2013   Recovering alcoholic in remission (HCC) 10/14/2013   Allergic rhinitis 10/14/2013   Adjustment disorder with anxiety 10/14/2013   Elevated low-density lipoprotein level 10/14/2013   Essential hypertension 10/15/2012   Heart murmur 05/25/2012    PCP: Irena Reichmann, DO  REFERRING PROVIDER: Steward Drone, MD  REFERRING DIAG: Back pain, ITB syndrome  Rationale for Evaluation and Treatment: Rehabilitation  THERAPY DIAG:  Other low back pain  Muscle spasm of back  Pain in left hip  Difficulty in walking, not elsewhere classified  Pain in left foot  ONSET DATE: 02/18/23  SUBJECTIVE:  SUBJECTIVE STATEMENT: Patient with left hip pain since July, no known cause, crossing left leg over the right really hurts, getting up from sitting feels like the thigh "will break"recent MRI below, MD feels HS tendonopathy and ITB tightness, reports that she is walking a little better, still a lot of pain when getting up from sitting Patient reports the leg pain remains unchanged. She has started going to the gym to strengthen her whole body.   PERTINENT HISTORY:  Has concussion currently, has anteiolisthesis in cervical  PAIN:  Are you having pain? Yes  2/10, left anterior thigh, worse with crossing left leg over and with getting up from sitting , pain up to 10/10 walking around pain will subside to 2/10.    PRECAUTIONS: None  WEIGHT BEARING RESTRICTIONS: No  FALLS:  Has patient fallen in last 6 months? Yes 1 fall in September onto the left side, sitting to drive made the left leg hurt  LIVING ENVIRONMENT: Lives with: lives with their family and lives with their spouse Lives in: House/apartment Stairs: No Has following equipment at home: None  OCCUPATION: yoga instructor  PLOF: Independent and daily yoga, yardwork, housework PATIENT GOALS: have less pain  NEXT MD VISIT:  couple of months OBJECTIVE:   DIAGNOSTIC FINDINGS:  IMPRESSION: 1. Unchanged lumbar disc and facet degeneration without significant spinal stenosis. 2. Mild right neural foraminal stenosis at L4-5.  IMPRESSION: 1. Mild to moderate degenerative hip arthropathy bilaterally. 2. Suspected degeneration of the anterior superior labrum although a well-defined tear is not observed. 3. Symmetric proximal hamstring tendinopathy.  SCREENING FOR RED FLAGS: Bowel or bladder incontinence: No Spinal tumors: No Cauda equina syndrome: No Compression fracture: No Abdominal aneurysm: No  COGNITION: Overall cognitive status: Within functional limits for tasks assessed     SENSATION: WFL  MUSCLE LENGTH: Good HS,very tight quad, piriformis, tight and painful ITB  POSTURE: decreased thoracic kyphosis  PALPATION: Very tight and tender in the left quad, ITB, vastus lateralis and into the left buttock  LUMBAR ROM:   AROM eval  Flexion WNL's  Extension Decreased 25%  Right lateral flexion Decreased 50%  Left lateral flexion Decreased 100% P!  Right rotation Decreased 50%  Left rotation Decreased 50%   (Blank rows = not tested)  LOWER EXTREMITY ROM:   WFL LOWER EXTREMITY MMT:   left hip 4-/5   SPECIAL TESTS:  Slump test: Negative and Long sit test:  Positive  GAIT:  mild antalgic on the left when she gets up  TODAY'S TREATMENT:                                                                                                                              DATE:   03/22/23 Bike L3 x 5 minutes Deep STM and cross friction to L ITB and mid Vastus Lateralis F/B side lying stretch. She reported stretch in hip and gluts, but no stretch in leg. After about a minute she started feeling  the usual pain in the mid/lat thigh, still no stretch. Supine stability activities- pelvic tilts, iso add/abd, bridge with hip abd against G tband resistance, x 10 each, Bridge with Tband, alternating leg kick- difficult to maintain good form, but able to complete 2 x 5 Standing hip abd on step, 10 on R, felt it in gluts, 8 on L with pain coming into lat/med thigh, no gluts. SLS hip pivots to touch target approx 20" off floor. 5 on R, 2 on L, with great difficulty. She reported that this activitiy really got her gluts firing.  03/08/23 Bike x 4 minutes level 3 LEg curls 20# x10 and then x6 Leg extension 5# x10 and x 6 Leg press 20# x10 and x 6 Resisted gait each side and backward x 3 40# 5# hip extension x10 and x 6  Tmill push fwd and bkwd 15 seconds x 3 each Passive LE stretch focus on the hip flexor and quad STM to the quad, ITB Manual joint distraction Negative hip scour test  03/03/23 Passive HS, piriformis stretch, could not tolerate passive ITB stretch, did this in standing Supine green tband left hip abduction Supine green tband left hip adduction Supine left hip extension green tband Seated red tband ball b/n knees ER and IR of the hips STM with hands and with t-gun to the left ITB, vastus lateralis  PATIENT EDUCATION:  Education details: POC and HEP Person educated: Patient Education method: Programmer, multimedia, Demonstration, Tactile cues, Verbal cues, and Handouts Education comprehension: verbalized understanding, returned demonstration, verbal cues  required, and tactile cues required  HOME EXERCISE PROGRAM:  ASSESSMENT:  CLINICAL IMPRESSION: Patient is a 72 y.o. female who was seen today for physical therapy evaluation and treatment for left hip, thigh pain.  She had recent MRI of the back and the hip as noted above.  Very tight left leg mms, weak core, when she crosses the left leg over this causes a lot of pain, sitting for periods of time and then getting up causes a lot of pain.Today started with some deep STM and cross Friction massage to L mid/lat VL and ITB F/B attempted stretch. She reported no stretching, but did feel the pain after approx 1 minute. Then moved to stability in B hips and trunk. She tolerated all B limb exercises. More challenged by U exercises. No pain reported in lat thigh with strengthening.  OBJECTIVE IMPAIRMENTS: decreased activity tolerance, decreased mobility, difficulty walking, decreased ROM, decreased strength, increased muscle spasms, impaired flexibility, improper body mechanics, postural dysfunction, and pain.   REHAB POTENTIAL: Good  CLINICAL DECISION MAKING: Stable/uncomplicated  EVALUATION COMPLEXITY: Low   GOALS: Goals reviewed with patient? Yes  SHORT TERM GOALS: Target date: 05/08/23  Independent with initial HEP Goal status: met 03/03/23  LONG TERM GOALS: Target date: 05/11/23  Independent with advanced HEP and gym Goal status: ongoing 03/08/23  2.  Report pain decreased overall 50% Goal status: 03/22/23-Patient reports continued pain in her lat/mid thigh and ITB. Ongoing.  3.  Increase lumbar ROM 25% Goal status: ongoing 03/08/23  4. Increase left hip strength to 4+/5 Goal status: INITIAL PLAN:  PT FREQUENCY: 1x/week  PT DURATION: 12 weeks  PLANNED INTERVENTIONS: Therapeutic exercises, Therapeutic activity, Neuromuscular re-education, Balance training, Gait training, Patient/Family education, Self Care, Joint mobilization, Dry Needling, Electrical stimulation, Spinal  mobilization, Moist heat, Traction, and Manual therapy.  PLAN FOR NEXT SESSION: STM tot he left buttock, ITB, vastus lateralis, TFL, quad, glutes, flexibility if she tolerates, start to increase strength as  tolerated   Iona Beard, DPT 03/22/2023, 10:15 AM

## 2023-03-24 ENCOUNTER — Ambulatory Visit (INDEPENDENT_AMBULATORY_CARE_PROVIDER_SITE_OTHER): Payer: Medicare HMO | Admitting: Sports Medicine

## 2023-03-24 ENCOUNTER — Encounter: Payer: Self-pay | Admitting: Sports Medicine

## 2023-03-24 ENCOUNTER — Ambulatory Visit (HOSPITAL_BASED_OUTPATIENT_CLINIC_OR_DEPARTMENT_OTHER): Payer: Medicare HMO | Admitting: Physical Therapy

## 2023-03-24 VITALS — BP 124/82 | Ht 67.0 in | Wt 150.0 lb

## 2023-03-24 DIAGNOSIS — M25562 Pain in left knee: Secondary | ICD-10-CM | POA: Diagnosis not present

## 2023-03-24 NOTE — Progress Notes (Signed)
   Subjective:    Patient ID: Donna [name], female    DOB: 03/07/1951, 72 y.o.   MRN: [id number]  HPI chief complaint: Left thigh pain  Heli presents today with lateral left thigh pain has been present since July.  Her pain is worse when doing single-leg type exercises or when internally rotating the hip while lying on her side.  Although her pain is along the lateral left thigh she does endorse some intermittent groin pain as well.  She saw another provider who got MRIs of both the lumbar spine and left hip.  Lumbar spine MRI showed some stable degenerative changes.  MRI of the left hip showed mild to moderate hip arthropathy.  Review of Systems As above    Objective:   Physical Exam  Left hip: Smooth painless hip range of motion with a negative logroll.  Negative FADIR.  She is able to reproduce her pain with single-leg exercises as well as with lying on her right side and letting her left hip passively internally rotate.      Assessment & Plan:   Left thigh pain likely secondary to hip OA  For diagnostic as well as therapeutic reasons I recommended an ultrasound-guided intra-articular hip injection to be done in the [place] office by Dr. Pearletha Forge.  This will answer the question as to whether or not her thigh pain is related to her hip OA.  She will also continue to strengthen the hip and lower leg as she knows this to will be helpful.  This note was dictated using Dragon naturally speaking software and may contain errors in syntax, spelling, or content which have not been identified prior to signing this note.
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
## 2023-03-25 NOTE — Procedures (Signed)
General Information  Name: Donna [name], Donna [name] BMI: 25 Physician: ,   ID: [id number] Height: 67 in Technician: ,   Sex: Female Weight: 161 lb Record: [id number]  Age: 72 [1950-09-01] Date: 03/15/2023 Scorer: Irene Pap   Nap 1 Nap Start: 08:32:18 AM Nap End: 08:52:02 AM    Latency to Sleep Onset: 4.64m Total Sleep Time: 14.47m Stg Dur REM N1 N2 N3   0.67m 4.63m 10.45m 0.23m    Nap 2 Nap Start: 10:42:25 AM Nap End: 11:11:02 AM    Latency to Sleep Onset: 14.53m Total Sleep Time: 14.34m Stg Dur REM N1 N2 N3   0.69m 1.42m 13.69m 0.66m    Nap 3 Nap Start: 12:36:03 PM Nap End: 12:59:05 PM    Latency to Sleep Onset: 8.92m Total Sleep Time: 15.19m Stg Dur REM N1 N2 N3   0.65m 2.100m 12.41m 0.76m    Nap 4 Nap Start: 02:32:25 PM Nap End: 02:51:05 PM    Latency to Sleep Onset: 4.50m Total Sleep Time: 15.28m Stg Dur REM N1 N2 N3   0.13m 1.17m 13.23m 0.87m      Mean Sleep Latency:   Number of Sleep Onset REM Periods: 0 [facility] MSLT Report    Name: Donna [name], Donna [name] Reference 0  Study Date: 03/15/2023 Procedure #: 0 DOB: 1951/02/19  Protocol This is a 13 channel Multiple Sleep Latency Test comprised of 5 channels of EEG (T3-Cz, Cz-T4, F4-M1, C4-M1, O2-M1). 3 channels of Chin EMG, 4 channels of EOG and 1 channel for ECG. All channels were sampled at 256 Hz.  This polysomnographic procedure is designed to evaluate (1) the complaint of excessive daytime sleepiness by quantifying the time required to fall asleep and (2) the possibility of narcolepsy by checking for abnormally short latencies to REM sleep. Electrographic variables include EEG, EMG, EOG and ECG. Patients are monitored throughout four or five 20-minute opportunities to sleep (naps) at two-hour intervals. For each nap, the patient is allowed 20 minutes to fall asleep. Once asleep, the patient is awakened after 15 minutes. Between naps, the patient is kept as alert as possible. A sleep latency of 20 minutes indicates  that no sleep occurred. Parametric Analysis Total Number of Naps 5   NAP # Time of Nap Sleep Latency (mins) REM Latency (mins) Sleep Time Percent Awake Time Percent   1 08:30 4.5 20.0 74 26    2 08:53 14.5 20.0 50 50    3 09:25 8.0 20.0 65 35    4 09:51 4.0 20.0 79 21       MSLT Summary of Naps  Sleepiness Index: 49.0  Mean Sleep Latency: 7.5 ,m 7.6  Number of Naps with REM Sleep: 0 0    Name: Donna [name], Donna [name] Reference 0  Study Date: 03/15/2023 Procedure #: 0 DOB: 1951-01-11   IMPRESSION: This MSLT shows a mean sleep latency of 7.5 minutes which is abnormally short. This confirms an excessive daytime sleepiness to be present. No REM sleep onset was documented in 4 naps.    RECOMMENDATIONS: This patient qualifies for treatment under the dx of idiopathic hypersomnia.    Narcolepsy cannot be confirmed due to the lack of REM sleep onset.  The clinical symptoms all would support a Narcolepsy diagnosis, including the result HLA testing.  I attest to having reviewed every epoch of the entire raw data recording prior to the issuance of this report in accordance with the Standards of the American Academy of Sleep Medicine. Melvyn Novas, MD

## 2023-03-29 ENCOUNTER — Telehealth: Payer: Self-pay | Admitting: Anesthesiology

## 2023-03-29 ENCOUNTER — Ambulatory Visit (INDEPENDENT_AMBULATORY_CARE_PROVIDER_SITE_OTHER): Payer: Medicare HMO | Admitting: Family Medicine

## 2023-03-29 VITALS — BP 138/84 | Ht 67.0 in | Wt 150.0 lb

## 2023-03-29 DIAGNOSIS — M25552 Pain in left hip: Secondary | ICD-10-CM

## 2023-03-29 NOTE — Telephone Encounter (Signed)
Called pt and relayed the MSLT results. Per Dr Vickey Huger, the study showed a MSL of 7.5 min, no SREM onsets. Pt informed a diagnosis of Narcolepsy can not be confirmed due to the lack of REM sleep onset. Pt had no additional questions  and thanked me for the call.

## 2023-03-29 NOTE — Progress Notes (Signed)
Patient referred for intraarticular hip injection.  However, she has allergy to corticosteroids so did not proceed with this today.  For her hip arthritis we discussed home exercise program for her to do.  Tylenol, topical medications, supplements that may help, nsaids rarely.  F/u prn.

## 2023-03-29 NOTE — Patient Instructions (Signed)
Your pain is due to arthritis. These are the different medications you can take for this: Tylenol 500mg  1-2 tabs three times a day for pain. Voltaren gel, capsaicin, aspercreme, or biofreeze topically up to four times a day may also help with pain. Some supplements that may help for arthritis: Boswellia extract, curcumin, pycnogenol Aleve 1-2 tabs twice a day with food Cortisone injections are an option. It's important that you continue to stay active. Hip strengthening exercises daily. Consider physical therapy to strengthen muscles around the joint that hurts to take pressure off of the joint itself. Shoe inserts with good arch support may be helpful. Heat or ice 15 minutes at a time 3-4 times a day as needed to help with pain. Water aerobics and cycling with low resistance are the best two types of exercise for arthritis though any exercise is ok as long as it doesn't worsen the pain. Follow up with me as needed.

## 2023-03-29 NOTE — Telephone Encounter (Signed)
-----   Message from [place] Dohmeier sent at 03/25/2023  6:01 PM EST ----- MSL of 7.5 minutes, no SREM onsets.

## 2023-04-19 ENCOUNTER — Ambulatory Visit: Payer: Medicare HMO | Admitting: Dermatology

## 2023-04-19 ENCOUNTER — Encounter: Payer: Self-pay | Admitting: Dermatology

## 2023-04-19 ENCOUNTER — Encounter: Payer: Self-pay | Admitting: Orthopaedic Surgery

## 2023-04-19 ENCOUNTER — Ambulatory Visit: Payer: Medicare HMO | Admitting: Orthopaedic Surgery

## 2023-04-19 DIAGNOSIS — M67952 Unspecified disorder of synovium and tendon, left thigh: Secondary | ICD-10-CM | POA: Diagnosis not present

## 2023-04-19 DIAGNOSIS — L578 Other skin changes due to chronic exposure to nonionizing radiation: Secondary | ICD-10-CM | POA: Diagnosis not present

## 2023-04-19 DIAGNOSIS — L814 Other melanin hyperpigmentation: Secondary | ICD-10-CM

## 2023-04-19 DIAGNOSIS — Z808 Family history of malignant neoplasm of other organs or systems: Secondary | ICD-10-CM

## 2023-04-19 DIAGNOSIS — Z1283 Encounter for screening for malignant neoplasm of skin: Secondary | ICD-10-CM

## 2023-04-19 DIAGNOSIS — W908XXA Exposure to other nonionizing radiation, initial encounter: Secondary | ICD-10-CM | POA: Diagnosis not present

## 2023-04-19 DIAGNOSIS — D1801 Hemangioma of skin and subcutaneous tissue: Secondary | ICD-10-CM | POA: Diagnosis not present

## 2023-04-19 DIAGNOSIS — B078 Other viral warts: Secondary | ICD-10-CM | POA: Diagnosis not present

## 2023-04-19 DIAGNOSIS — M25552 Pain in left hip: Secondary | ICD-10-CM | POA: Diagnosis not present

## 2023-04-19 DIAGNOSIS — M7632 Iliotibial band syndrome, left leg: Secondary | ICD-10-CM

## 2023-04-19 DIAGNOSIS — L570 Actinic keratosis: Secondary | ICD-10-CM

## 2023-04-19 DIAGNOSIS — B351 Tinea unguium: Secondary | ICD-10-CM | POA: Diagnosis not present

## 2023-04-19 DIAGNOSIS — L918 Other hypertrophic disorders of the skin: Secondary | ICD-10-CM | POA: Diagnosis not present

## 2023-04-19 DIAGNOSIS — L821 Other seborrheic keratosis: Secondary | ICD-10-CM | POA: Diagnosis not present

## 2023-04-19 DIAGNOSIS — D229 Melanocytic nevi, unspecified: Secondary | ICD-10-CM

## 2023-04-19 NOTE — Patient Instructions (Addendum)

## 2023-04-19 NOTE — Progress Notes (Signed)
The patient is someone I am seeing for the first time and she has been sent to me to evaluate her left hip.  All of her pain is along the lateral aspect of the IT band and the lateral left thigh.  She denies any groin pain at all.  She has had a MRI of her left hip that showed some mild arthritic changes.  This has been going on since July of this year.  She says that she has an allergy to steroid and sometimes it can flareup asthma but she has not had any type of injection.  She has been through physical therapy.  She is sent to me after MRI of her left hip showed some arthritic findings in the joint itself but this is equal bilaterally and she has the same arthritis on her right hip.  I was able to review all of her notes within epic.  She has seen Dr. Margaretha Sheffield and Dr. Parks Neptune as well.  On exam her left hip and right hip moves smoothly and fluidly with no blocks or rotation and no pain in the groin at all on rotation.  There is no pain with compression of either hip.  There is not a lot of pain at all over the trochanteric area of the left hip but her mid thigh on the lateral aspect over the IT band shows severe pain with any palpation along the IT band in this area.  Her IT band is also very tight.  I did go over all of her imaging studies.  I do not believe that she needs a hip replacement and I do not feel that hip replacement would help her current situation of pain that has been going on for the last 5 months.  However, I do feel that she is a perfect candidate to see my partner Dr. Shon Baton to see if she is a candidate for shockwave therapy.  She would even consider a steroid injection over this area once she considers trying this.  We will see about getting an appointment set up with my partner Dr. Shon Baton.  She agrees with this treatment plan wholeheartedly.

## 2023-04-19 NOTE — Progress Notes (Signed)
Total Body Skin Exam (TBSE) Visit   Subjective  Donna [name] is a 72 y.o. female who presents for the following: Skin Cancer Screening and Full Body Skin Exam  Patient presents today for follow up visit for TBSE. Patient was last evaluated on 10/29/22 Bx done positive for SCC & BCC. Patient denies medication changes. Patient reports she does have spots, moles and lesions of concern to be evaluated. Patient reports throughout her lifetime she has had  none  sun exposure. Currently, patient reports if she has excessive sun exposure, she does apply sunscreen and/or wears protective coverings. Patient reports she has hx of bx. Patient reports  family history of skin cancers. (Mother & father - unsure of type) The patient has spots, moles and lesions to be evaluated, some may be new or changing and the patient has concerns that these could be cancer.  The following portions of the chart were reviewed this encounter and updated as appropriate: medications, allergies, medical history  Review of Systems:  No other skin or systemic complaints except as noted in HPI or Assessment and Plan.  Objective  Well appearing patient in no apparent distress; mood and affect are within normal limits.  A full examination was performed including scalp, head, eyes, ears, nose, lips, neck, chest, axillae, abdomen, back, buttocks, bilateral upper extremities, bilateral lower extremities, hands, feet, fingers, toes, fingernails, and toenails. All findings within normal limits unless otherwise noted below.   Relevant physical exam findings are noted in the Assessment and Plan.  Left Eyebrow, Right Forearm - Anterior, Right Nasal Sidewall, Right Posterior Neck, Right Thigh - Anterior, Right Upper Cutaneous Lip Erythematous thin papules/macules with gritty scale.     Assessment & Plan   LENTIGINES, SEBORRHEIC KERATOSES, HEMANGIOMAS - Benign normal skin lesions - Benign-appearing - Call for any  changes  MELANOCYTIC NEVI - Tan-brown and/or pink-flesh-colored symmetric macules and papules - Benign appearing on exam today - Observation - Call clinic for new or changing moles - Recommend daily use of broad spectrum spf 30+ sunscreen to sun-exposed areas.   MILD ACTINIC DAMAGE - Chronic condition, secondary to cumulative UV/sun exposure - diffuse scaly erythematous macules with underlying dyspigmentation - Recommend daily broad spectrum sunscreen SPF 30+ to sun-exposed areas, reapply every 2 hours as needed.  - Staying in the shade or wearing long sleeves, sun glasses (UVA+UVB protection) and wide brim hats (4-inch brim around the entire circumference of the hat) are also recommended for sun protection.  - Call for new or changing lesions.  SKIN CANCER SCREENING PERFORMED TODAY.   ONYCHOMYCOSIS Exam: Thickened toenails with subungal debris c/w onychomycosis  Treatment Plan: -Continue Ciclopirox. Reminded pt to wipe off at the end of the day with alcohol so it doesn't accumulate on the nail.    Acrochordons (Skin Tags) - Fleshy, skin-colored pedunculated papules - Benign appearing.  - Observe. - If desired, they can be removed with an in office procedure that is not covered by insurance. - Please call the clinic if you notice any new or changing lesions.    AK (actinic keratosis) (6) Right Forearm - Anterior; Right Thigh - Anterior; Left Eyebrow; Right Nasal Sidewall; Right Upper Cutaneous Lip; Right Posterior Neck  Destruction of lesion - Left Eyebrow, Right Forearm - Anterior, Right Nasal Sidewall, Right Posterior Neck, Right Upper Cutaneous Lip Complexity: simple   Destruction method: cryotherapy   Informed consent: discussed and consent obtained   Timeout:  patient name, date of birth, surgical site, and procedure verified  Lesion destroyed using liquid nitrogen: Yes   Region frozen until ice ball extended beyond lesion: Yes   Outcome: patient tolerated procedure  well with no complications   Post-procedure details: wound care instructions given    Other viral warts Right Palmar Middle 4th Finger  Destruction of lesion - Right Palmar Middle 4th Finger Complexity: simple   Destruction method: cryotherapy   Informed consent: discussed and consent obtained   Timeout:  patient name, date of birth, surgical site, and procedure verified Lesion destroyed using liquid nitrogen: Yes   Region frozen until ice ball extended beyond lesion: Yes   Outcome: patient tolerated procedure well with no complications   Post-procedure details: wound care instructions given     No follow-ups on file.    Documentation: I have reviewed the above documentation for accuracy and completeness, and I agree with the above.   I, Shirron Marcha Solders, CMA, am acting as scribe for [employer], DO.   Langston Reusing, DO

## 2023-05-03 ENCOUNTER — Encounter: Payer: Self-pay | Admitting: Sports Medicine

## 2023-05-03 ENCOUNTER — Ambulatory Visit: Payer: Medicare HMO | Admitting: Sports Medicine

## 2023-05-03 DIAGNOSIS — M25552 Pain in left hip: Secondary | ICD-10-CM | POA: Diagnosis not present

## 2023-05-03 DIAGNOSIS — M7632 Iliotibial band syndrome, left leg: Secondary | ICD-10-CM

## 2023-05-03 DIAGNOSIS — M1612 Unilateral primary osteoarthritis, left hip: Secondary | ICD-10-CM

## 2023-05-03 NOTE — Progress Notes (Signed)
Donna [name] - 72 y.o. female MRN [id number]  Date of birth: [birth year]  Office Visit Note: Visit Date: 05/03/2023 PCP: Irena Reichmann, DO Referred by: Irena Reichmann, DO  Subjective: Chief Complaint  Patient presents with   Left Hip - Pain   Left Leg - Pain   HPI: Donna [name] is a very pleasant 72 y.o. female who presents today for chronic left lateral hip pain. 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  She is having pain over the left leg around the IT band.  This has been going on for a number of months and has had an MRI both of the low back and hip.  More of her pain is over the lateral side near the IT band.  She has done some physical therapy and was doing some stretches, but this seems to aggravate the area more.  She denies any numbness or tingling.  She has called back on her therapy.  Pertinent ROS were reviewed with the patient and found to be negative unless otherwise specified above in HPI.   Assessment & Plan: Visit Diagnoses:  1. It band syndrome, left   2. Pain of left hip   3. Unilateral primary osteoarthritis, left hip    Plan: Discussed with Delories possible etiology of her chronic left hip and thigh pain.  She does have MRI confirmed mild to moderate osteoarthritis, but her physical exam does not seem to be emanating from the hip joint.  She does have some tenderness as well as tightness of the IT band and the mid belly of the vastus lateralis musculature--this seems to be more of the origin of her pain currently.  Through shared decision-making, we did proceed with extracorporeal shockwave therapy to that area.  I would like to proceed with 2-3 treatments to see what sort of response she receives from this before deciding on additional treatments.  I am okay with gentle stretching but would like her to hold back on some of her dedicated physical therapy until she finds improvement.  We will have her follow-up in the next 1-2 weeks for repeat treatment and  evaluation.  She does have a remote history of local reaction to steroid many years ago, although denied any systemic symptoms.  We will start with extracorporeal shockwave therapy, but could consider low-dose trigger point injection somewhat down the road if needed.  Follow-up: Return in about 2 weeks (around 05/17/2023) for make 2 appts --> one in 2 weeks, then subsequent week for 2nd appt (ITB syndrome).   Meds & Orders: No orders of the defined types were placed in this encounter.  No orders of the defined types were placed in this encounter.    Procedures: Procedure: ECSWT Indications:  ITB syndrome   Procedure Details Consent: Risks of procedure as well as the alternatives and risks of each were explained to the patient.  Verbal consent for procedure obtained. Time Out: Verified patient identification, verified procedure, site was marked, verified correct patient position. The area was cleaned with alcohol swab.     The left ITB and underlying VL was targeted for Extracorporeal shockwave therapy.    Preset: Muscular injury Power Level: 100 mJ Frequency: 10 Hz Impulse/cycles: 2800 Head size: Regular   Patient tolerated procedure well without immediate complications.         Clinical History: No specialty comments available.  She reports that she quit smoking about 26 years ago. Her smoking use included cigarettes. She started smoking about  56 years ago. She has never used smokeless tobacco. No results for input(s): "HGBA1C", "LABURIC" in the last 8760 hours.  Objective:    Physical Exam  Gen: Well-appearing, in no acute distress; non-toxic CV: Well-perfused. Warm.  Resp: Breathing unlabored on room air; no wheezing. Psych: Fluid speech in conversation; appropriate affect; normal thought process Neuro: Sensation intact throughout. No gross coordination deficits.   Ortho Exam - Left hip/leg: No greater trochanter TTP or ASIS TTP.  There is fairly fluid internal and  external logroll.  Negative Stinchfield, negative FADIR test, Weber test reproduces some of her pain but only more so down to the lateral thigh.  There is pain with IT band stretching with pretzel stretch. + fascial restriction in this region.  Imaging:  Narrative & Impression  CLINICAL DATA:  Chronic left hip pain   EXAM: MR OF THE LEFT HIP WITHOUT CONTRAST   TECHNIQUE: Multiplanar, multisequence MR imaging was performed. No intravenous contrast was administered.   COMPARISON:  Radiographs 09/30/2022   FINDINGS: Bones: Mild degenerative spurring of both femoral heads and acetabula. No regional marrow edema or acute bony findings.   Articular cartilage and labrum   Articular cartilage: Mild to moderate craniocaudad and axial loss of articular cartilage thickness. No focal defect identified.   Labrum: Suspected degeneration of the anterior superior labrum although a well-defined tear is not observed.   Joint or bursal effusion   Joint effusion:  Absent   Bursae: No regional bursitis.   Muscles and tendons   Muscles and tendons:  Symmetric proximal hamstring tendinopathy.   Other findings   Miscellaneous:   No supplemental non-categorized findings.   IMPRESSION: 1. Mild to moderate degenerative hip arthropathy bilaterally. 2. Suspected degeneration of the anterior superior labrum although a well-defined tear is not observed. 3. Symmetric proximal hamstring tendinopathy.     Electronically Signed   By: Gaylyn Rong M.D.   On: 02/16/2023 10:45   Narrative & Impression  CLINICAL DATA:  Lumbar radiculopathy. Low back pain radiating to the left leg with numbness and weakness.   EXAM: MRI LUMBAR SPINE WITHOUT CONTRAST   TECHNIQUE: Multiplanar, multisequence MR imaging of the lumbar spine was performed. No intravenous contrast was administered.   COMPARISON:  Lumbar spine MRI 06/18/2021   FINDINGS: Segmentation:  5 lumbar vertebrae.  Rudimentary disc at  S1-2.   Alignment:  Normal.   Vertebrae: No fracture, suspicious marrow lesion, or significant marrow edema.   Conus medullaris and cauda equina: Conus extends to the lower L1 level. Conus and cauda equina appear normal.   Paraspinal and other soft tissues: Unremarkable.   Disc levels:   Disc desiccation from L2-3 through L5-S1 with no more than mild associated disc space narrowing.   T12-L1: Negative.   L1-2: Negative.   L2-3: Mild disc bulging, a small right foraminal disc protrusion with annular fissure, and mild to moderate facet and ligamentum flavum hypertrophy without significant stenosis, unchanged.   L3-4: Minimal disc bulging, bilateral foraminal to extraforaminal disc protrusions with an annular fissure on the left, and mild facet and ligamentum flavum hypertrophy without significant stenosis, unchanged.   L4-5: Disc bulging, a right foraminal disc protrusion, and moderate facet and ligamentum flavum hypertrophy result in mild right neural foraminal stenosis without significant spinal stenosis, unchanged.   L5-S1: Disc bulging and moderate facet and ligamentum flavum hypertrophy without significant stenosis, unchanged.   IMPRESSION: 1. Unchanged lumbar disc and facet degeneration without significant spinal stenosis. 2. Mild right neural foraminal stenosis at L4-5.  Electronically Signed   By: Sebastian Ache M.D.   On: 10/15/2022 13:04   Narrative & Impression  CLINICAL DATA:  Pain.   EXAM: DG HIP (WITH OR WITHOUT PELVIS) 3V LEFT   COMPARISON:  None Available.   FINDINGS: There is no evidence of hip fracture or dislocation. Pelvic ring is intact. There is no evidence of arthropathy or other focal bone abnormality.   IMPRESSION: Negative.     Electronically Signed   By: Layla Maw M.D.   On: 10/03/2022 09:56     Past Medical/Family/Surgical/Social History: Medications & Allergies reviewed per EMR, new medications updated. Patient  Active Problem List   Diagnosis Date Noted   Gasping for breath 08/19/2021   PTSD (post-traumatic stress disorder) 08/19/2021   Parasomnia overlap disorder 08/19/2021   Snoring 08/19/2021   Excessive daytime sleepiness 08/19/2021   Sleep paralysis, recurrent isolated 08/19/2021   Asthma 02/19/2021   Multiple drug allergies 02/19/2021   Gluten intolerance 02/19/2021   Chronic rhinitis 02/19/2021   Metatarsalgia of left foot 11/14/2020   Achilles tendinosis of right lower extremity 11/14/2020   Essential tremor 07/04/2019   DOE (dyspnea on exertion) 01/20/2019   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 06/13/2018   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   Superficial basal cell carcinoma 06/13/2018   Upper airway cough syndrome 06/13/2018   Atrophic vaginitis 02/17/2018   Bilateral leg cramps 02/17/2018   Fatty liver, alcoholic 04/22/2016   Depression 04/19/2016   Withdrawal complaint 04/19/2016   Hx of adenomatous colonic polyps 08/03/2015   Herpes simplex 06/15/2014   GERD (gastroesophageal reflux disease) 10/14/2013   Anxiety state 10/14/2013   Recovering alcoholic in remission (HCC) 10/14/2013   Allergic rhinitis 10/14/2013   Adjustment disorder with anxiety 10/14/2013   Elevated low-density lipoprotein level 10/14/2013   Essential hypertension 10/15/2012   Heart murmur 05/25/2012   Past Medical History:  Diagnosis Date   Anxiety    Asthma    Basal cell carcinoma 06/29/2017   right lip - CX3 + 5FU   BCC (basal cell carcinoma of skin) 10/29/2022   left lower leg, posterior - Mohs 01/27/2023   Cataract    bilateral lens implants   Cough variant asthma    Depression    GERD (gastroesophageal reflux disease)    Heart murmur    Hiatal hernia    Hyperlipidemia    IBS (irritable bowel syndrome)    SCC (squamous cell carcinoma) 10/29/2022   right lower leg, anterior - Mohs 12/28/2022 Dr Daphine Deutscher   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 10/23/2008   chest - CX3 + 5FU   Squamous cell carcinoma of skin 09/27/2014   left thigh - tx p bx   Substance abuse (HCC)    Family History  Problem Relation Age of Onset   Dementia Mother    Diabetes Mother    Melanoma Father    Colon cancer Father 59       Survived   Heart disease Father    Heart attack Father    Rheum arthritis Father    Dementia Father    Healthy Sister    Healthy Brother    Dementia Maternal Aunt    Dementia Maternal Uncle    Colon polyps Paternal Aunt    Pancreatic cancer Paternal Aunt    Rectal cancer Paternal Aunt    Colon cancer Paternal Aunt    Colon cancer Paternal Grandmother    Breast  cancer Maternal Grandmother    Stomach cancer Neg Hx    Esophageal cancer Neg Hx    Liver cancer Neg Hx    Past Surgical History:  Procedure Laterality Date   ANKLE SURGERY     Bilateral    CATARACT EXTRACTION Bilateral    CERVICAL SPINE SURGERY  07/21/2021   TUBAL LIGATION     Social History   Occupational History   Occupation: [employer]  Tobacco Use   Smoking status: Former    Current packs/day: 0.00    Types: Cigarettes    Start date: 10/16/1966    Quit date: 10/15/1996    Years since quitting: 26.5   Smokeless tobacco: Never  Vaping Use   Vaping status: Never Used  Substance and Sexual Activity   Alcohol use: No   Drug use: No   Sexual activity: Yes

## 2023-05-03 NOTE — Progress Notes (Signed)
Patient says that she is having pain in the left leg over the IT band; pain is about midway down the thigh. She says that she will feel pain in the leg, hip, and low back after sitting for an extended amount of time. Patient has been in physical therapy and has seen Dr. Magnus Ivan; they have said that her IT band is tight, and Dr. Magnus Ivan suggested possibly trying shockwave therapy. Patient was in physical therapy and says that any stretching of the IT band was very painful. She has backed off of physical therapy for now.

## 2023-05-06 ENCOUNTER — Encounter: Payer: Medicare HMO | Admitting: Psychology

## 2023-05-11 ENCOUNTER — Emergency Department (HOSPITAL_BASED_OUTPATIENT_CLINIC_OR_DEPARTMENT_OTHER): Payer: Medicare HMO

## 2023-05-11 ENCOUNTER — Observation Stay (HOSPITAL_BASED_OUTPATIENT_CLINIC_OR_DEPARTMENT_OTHER)
Admission: EM | Admit: 2023-05-11 | Discharge: 2023-05-12 | Disposition: A | Discharge status: Home / Self Care | Payer: Medicare HMO | Attending: General Surgery | Admitting: General Surgery

## 2023-05-11 ENCOUNTER — Encounter (HOSPITAL_BASED_OUTPATIENT_CLINIC_OR_DEPARTMENT_OTHER): Payer: Self-pay | Admitting: *Deleted

## 2023-05-11 ENCOUNTER — Other Ambulatory Visit: Payer: Self-pay

## 2023-05-11 ENCOUNTER — Encounter (HOSPITAL_COMMUNITY)
Admission: EM | Disposition: A | Discharge status: Home / Self Care | Payer: Self-pay | Source: Home / Self Care | Attending: Emergency Medicine

## 2023-05-11 ENCOUNTER — Emergency Department (HOSPITAL_BASED_OUTPATIENT_CLINIC_OR_DEPARTMENT_OTHER): Payer: Medicare HMO | Admitting: Anesthesiology

## 2023-05-11 ENCOUNTER — Emergency Department (HOSPITAL_COMMUNITY): Payer: Medicare HMO | Admitting: Anesthesiology

## 2023-05-11 DIAGNOSIS — R935 Abnormal findings on diagnostic imaging of other abdominal regions, including retroperitoneum: Secondary | ICD-10-CM | POA: Diagnosis not present

## 2023-05-11 DIAGNOSIS — Z87891 Personal history of nicotine dependence: Secondary | ICD-10-CM | POA: Diagnosis not present

## 2023-05-11 DIAGNOSIS — Z85828 Personal history of other malignant neoplasm of skin: Secondary | ICD-10-CM | POA: Diagnosis not present

## 2023-05-11 DIAGNOSIS — K353 Acute appendicitis with localized peritonitis, without perforation or gangrene: Principal | ICD-10-CM | POA: Insufficient documentation

## 2023-05-11 DIAGNOSIS — K3580 Unspecified acute appendicitis: Secondary | ICD-10-CM

## 2023-05-11 DIAGNOSIS — J45909 Unspecified asthma, uncomplicated: Secondary | ICD-10-CM | POA: Diagnosis not present

## 2023-05-11 DIAGNOSIS — Z9049 Acquired absence of other specified parts of digestive tract: Secondary | ICD-10-CM

## 2023-05-11 DIAGNOSIS — Z743 Need for continuous supervision: Secondary | ICD-10-CM | POA: Diagnosis not present

## 2023-05-11 DIAGNOSIS — Z79899 Other long term (current) drug therapy: Secondary | ICD-10-CM | POA: Insufficient documentation

## 2023-05-11 DIAGNOSIS — K5730 Diverticulosis of large intestine without perforation or abscess without bleeding: Secondary | ICD-10-CM | POA: Diagnosis not present

## 2023-05-11 DIAGNOSIS — R1031 Right lower quadrant pain: Secondary | ICD-10-CM | POA: Diagnosis not present

## 2023-05-11 DIAGNOSIS — R001 Bradycardia, unspecified: Secondary | ICD-10-CM | POA: Diagnosis not present

## 2023-05-11 HISTORY — PX: LAPAROSCOPIC APPENDECTOMY: SHX408

## 2023-05-11 LAB — COMPREHENSIVE METABOLIC PANEL
ALT: 14 U/L (ref 0–44)
AST: 19 U/L (ref 15–41)
Albumin: 4.3 g/dL (ref 3.5–5.0)
Alkaline Phosphatase: 55 U/L (ref 38–126)
Anion gap: 7 (ref 5–15)
BUN: 18 mg/dL (ref 8–23)
CO2: 30 mmol/L (ref 22–32)
Calcium: 9.2 mg/dL (ref 8.9–10.3)
Chloride: 94 mmol/L — ABNORMAL LOW (ref 98–111)
Creatinine, Ser: 0.71 mg/dL (ref 0.44–1.00)
GFR, Estimated: >60 mL/min (ref >60)
Glucose, Bld: 109 mg/dL — ABNORMAL HIGH (ref 70–99)
Potassium: 4 mmol/L (ref 3.5–5.1)
Sodium: 131 mmol/L — ABNORMAL LOW (ref 135–145)
Total Bilirubin: 0.9 mg/dL (ref 0.0–1.2)
Total Protein: 7 g/dL (ref 6.5–8.1)

## 2023-05-11 LAB — URINALYSIS, ROUTINE W REFLEX MICROSCOPIC
Bilirubin Urine: NEGATIVE
Glucose, UA: NEGATIVE mg/dL
Hgb urine dipstick: NEGATIVE
Ketones, ur: NEGATIVE mg/dL
Leukocytes,Ua: NEGATIVE
Nitrite: NEGATIVE
Protein, ur: NEGATIVE mg/dL
Specific Gravity, Urine: 1.009 (ref 1.005–1.030)
pH: 7 (ref 5.0–8.0)

## 2023-05-11 LAB — LIPASE, BLOOD: Lipase: 25 U/L (ref 11–51)

## 2023-05-11 LAB — CBC
HCT: 37.9 % (ref 36.0–46.0)
Hemoglobin: 12.8 g/dL (ref 12.0–15.0)
MCH: 30.2 pg (ref 26.0–34.0)
MCHC: 33.8 g/dL (ref 30.0–36.0)
MCV: 89.4 fL (ref 80.0–100.0)
Platelets: 258 10*3/uL (ref 150–400)
RBC: 4.24 MIL/uL (ref 3.87–5.11)
RDW: 12.3 % (ref 11.5–15.5)
WBC: 7.7 10*3/uL (ref 4.0–10.5)
nRBC: 0 % (ref 0.0–0.2)

## 2023-05-11 LAB — D-DIMER, QUANTITATIVE: D-Dimer, Quant: 0.6 ug{FEU}/mL — ABNORMAL HIGH (ref 0.00–0.50)

## 2023-05-11 SURGERY — APPENDECTOMY, LAPAROSCOPIC
Anesthesia: General | Site: Abdomen

## 2023-05-11 MED ORDER — FENTANYL CITRATE PF 50 MCG/ML IJ SOSY
50.0000 ug | PREFILLED_SYRINGE | INTRAMUSCULAR | Status: DC | PRN
  Administered 2023-05-11: 50 ug via INTRAVENOUS
  Filled 2023-05-11: qty 1

## 2023-05-11 MED ORDER — FENTANYL CITRATE (PF) 250 MCG/5ML IJ SOLN
INTRAMUSCULAR | Status: DC | PRN
  Administered 2023-05-11: 50 ug via INTRAVENOUS
  Administered 2023-05-11: 100 ug via INTRAVENOUS

## 2023-05-11 MED ORDER — SODIUM CHLORIDE 0.9 % IV SOLN: INTRAVENOUS | Status: DC | PRN

## 2023-05-11 MED ORDER — FENTANYL CITRATE PF 50 MCG/ML IJ SOSY
50.0000 ug | PREFILLED_SYRINGE | Freq: Once | INTRAMUSCULAR | Status: AC
  Administered 2023-05-11: 50 ug via INTRAVENOUS
  Filled 2023-05-11: qty 1

## 2023-05-11 MED ORDER — HYDROMORPHONE HCL 1 MG/ML IJ SOLN
INTRAMUSCULAR | Status: AC
  Filled 2023-05-11: qty 1

## 2023-05-11 MED ORDER — PIPERACILLIN-TAZOBACTAM 3.375 G IVPB 30 MIN
3.3750 g | Freq: Once | INTRAVENOUS | Status: AC
  Administered 2023-05-11: 3.375 g via INTRAVENOUS
  Filled 2023-05-11: qty 50

## 2023-05-11 MED ORDER — LIDOCAINE 2% (20 MG/ML) 5 ML SYRINGE
INTRAMUSCULAR | Status: AC
  Filled 2023-05-11: qty 5

## 2023-05-11 MED ORDER — PHENYLEPHRINE 80 MCG/ML (10ML) SYRINGE FOR IV PUSH (FOR BLOOD PRESSURE SUPPORT)
PREFILLED_SYRINGE | INTRAVENOUS | Status: AC
  Filled 2023-05-11: qty 10

## 2023-05-11 MED ORDER — SODIUM CHLORIDE 0.9 % IR SOLN
Status: DC | PRN
  Administered 2023-05-11: 1

## 2023-05-11 MED ORDER — LACTATED RINGERS IV BOLUS
1000.0000 mL | Freq: Once | INTRAVENOUS | Status: AC
  Administered 2023-05-11: 1000 mL via INTRAVENOUS

## 2023-05-11 MED ORDER — DIPHENHYDRAMINE HCL 12.5 MG/5ML PO ELIX: 12.5000 mg | ORAL_SOLUTION | Freq: Four times a day (QID) | ORAL | Status: DC | PRN

## 2023-05-11 MED ORDER — OXYCODONE HCL 5 MG PO TABS: 5.0000 mg | ORAL_TABLET | Freq: Once | ORAL | Status: DC | PRN

## 2023-05-11 MED ORDER — MORPHINE SULFATE (PF) 4 MG/ML IV SOLN
4.0000 mg | INTRAVENOUS | Status: DC | PRN
  Administered 2023-05-11: 4 mg via INTRAVENOUS
  Filled 2023-05-11: qty 1

## 2023-05-11 MED ORDER — BUPIVACAINE-EPINEPHRINE 0.25% -1:200000 IJ SOLN
INTRAMUSCULAR | Status: DC | PRN
  Administered 2023-05-11: 22 mL

## 2023-05-11 MED ORDER — ROCURONIUM BROMIDE 10 MG/ML (PF) SYRINGE
PREFILLED_SYRINGE | INTRAVENOUS | Status: AC
  Filled 2023-05-11: qty 10

## 2023-05-11 MED ORDER — ONDANSETRON HCL 4 MG/2ML IJ SOLN: 4.0000 mg | Freq: Three times a day (TID) | INTRAMUSCULAR | Status: DC | PRN

## 2023-05-11 MED ORDER — PROPOFOL 10 MG/ML IV BOLUS
INTRAVENOUS | Status: AC
  Filled 2023-05-11: qty 20

## 2023-05-11 MED ORDER — SUGAMMADEX SODIUM 200 MG/2ML IV SOLN
INTRAVENOUS | Status: DC | PRN
  Administered 2023-05-11: 200 mg via INTRAVENOUS

## 2023-05-11 MED ORDER — LACTATED RINGERS IV SOLN: INTRAVENOUS | Status: DC | PRN

## 2023-05-11 MED ORDER — ROCURONIUM BROMIDE 10 MG/ML (PF) SYRINGE
PREFILLED_SYRINGE | INTRAVENOUS | Status: DC | PRN
  Administered 2023-05-11: 60 mg via INTRAVENOUS

## 2023-05-11 MED ORDER — 0.9 % SODIUM CHLORIDE (POUR BTL) OPTIME
TOPICAL | Status: DC | PRN
  Administered 2023-05-11: 1000 mL

## 2023-05-11 MED ORDER — TRAMADOL HCL 50 MG PO TABS: 50.0000 mg | ORAL_TABLET | Freq: Four times a day (QID) | ORAL | Status: DC | PRN

## 2023-05-11 MED ORDER — IOHEXOL 300 MG/ML  SOLN
100.0000 mL | Freq: Once | INTRAMUSCULAR | Status: AC | PRN
  Administered 2023-05-11: 85 mL via INTRAVENOUS

## 2023-05-11 MED ORDER — BUPIVACAINE-EPINEPHRINE (PF) 0.25% -1:200000 IJ SOLN
INTRAMUSCULAR | Status: AC
Start: 2023-05-11 — End: ?
  Filled 2023-05-11: qty 30

## 2023-05-11 MED ORDER — KCL IN DEXTROSE-NACL 20-5-0.45 MEQ/L-%-% IV SOLN
INTRAVENOUS | Status: DC
  Filled 2023-05-11: qty 1000

## 2023-05-11 MED ORDER — LIDOCAINE 2% (20 MG/ML) 5 ML SYRINGE
INTRAMUSCULAR | Status: DC | PRN
  Administered 2023-05-11: 100 mg via INTRAVENOUS

## 2023-05-11 MED ORDER — PROPOFOL 10 MG/ML IV BOLUS
INTRAVENOUS | Status: DC | PRN
  Administered 2023-05-11: 120 mg via INTRAVENOUS

## 2023-05-11 MED ORDER — ONDANSETRON HCL 4 MG/2ML IJ SOLN
INTRAMUSCULAR | Status: DC | PRN
  Administered 2023-05-11: 4 mg via INTRAVENOUS

## 2023-05-11 MED ORDER — PHENYLEPHRINE 80 MCG/ML (10ML) SYRINGE FOR IV PUSH (FOR BLOOD PRESSURE SUPPORT)
PREFILLED_SYRINGE | INTRAVENOUS | Status: DC | PRN
  Administered 2023-05-11: 80 ug via INTRAVENOUS

## 2023-05-11 MED ORDER — ONDANSETRON HCL 4 MG/2ML IJ SOLN
INTRAMUSCULAR | Status: AC
  Filled 2023-05-11: qty 2

## 2023-05-11 MED ORDER — OXYCODONE HCL 5 MG/5ML PO SOLN: 5.0000 mg | Freq: Once | ORAL | Status: DC | PRN

## 2023-05-11 MED ORDER — PIPERACILLIN-TAZOBACTAM 3.375 G IVPB
3.3750 g | Freq: Three times a day (TID) | INTRAVENOUS | Status: DC
  Administered 2023-05-11 – 2023-05-12 (×3): 3.375 g via INTRAVENOUS
  Filled 2023-05-11 (×3): qty 50

## 2023-05-11 MED ORDER — ENOXAPARIN SODIUM 40 MG/0.4ML IJ SOSY
40.0000 mg | PREFILLED_SYRINGE | INTRAMUSCULAR | Status: DC
  Filled 2023-05-11: qty 0.4

## 2023-05-11 MED ORDER — HYDROMORPHONE HCL 1 MG/ML IJ SOLN
0.2500 mg | INTRAMUSCULAR | Status: DC | PRN
  Administered 2023-05-11 (×2): 0.5 mg via INTRAVENOUS

## 2023-05-11 MED ORDER — DIPHENHYDRAMINE HCL 50 MG/ML IJ SOLN: 12.5000 mg | Freq: Four times a day (QID) | INTRAMUSCULAR | Status: DC | PRN

## 2023-05-11 MED ORDER — FENTANYL CITRATE (PF) 250 MCG/5ML IJ SOLN
INTRAMUSCULAR | Status: AC
  Filled 2023-05-11: qty 5

## 2023-05-11 MED ORDER — OXYCODONE HCL 5 MG PO TABS
5.0000 mg | ORAL_TABLET | ORAL | Status: DC | PRN
  Administered 2023-05-11 – 2023-05-12 (×3): 10 mg via ORAL
  Filled 2023-05-11 (×3): qty 2

## 2023-05-11 MED ORDER — MIDAZOLAM HCL 2 MG/2ML IJ SOLN: 0.5000 mg | Freq: Once | INTRAMUSCULAR | Status: DC | PRN

## 2023-05-11 SURGICAL SUPPLY — 36 items
BAG COUNTER SPONGE SURGICOUNT (BAG) ×1 IMPLANT
BLADE CLIPPER SURG (BLADE) IMPLANT
CHLORAPREP W/TINT 26 (MISCELLANEOUS) ×1 IMPLANT
COVER SURGICAL LIGHT HANDLE (MISCELLANEOUS) ×1 IMPLANT
CUTTER FLEX LINEAR 45M (STAPLE) ×1 IMPLANT
DERMABOND ADVANCED .7 DNX12 (GAUZE/BANDAGES/DRESSINGS) ×1 IMPLANT
ELECT REM PT RETURN 9FT ADLT (ELECTROSURGICAL) ×1
ELECTRODE REM PT RTRN 9FT ADLT (ELECTROSURGICAL) ×1 IMPLANT
GLOVE BIO SURGEON STRL SZ8 (GLOVE) ×1 IMPLANT
GLOVE BIOGEL PI IND STRL 8 (GLOVE) ×1 IMPLANT
GOWN STRL REUS W/ TWL LRG LVL3 (GOWN DISPOSABLE) ×2 IMPLANT
GOWN STRL REUS W/ TWL XL LVL3 (GOWN DISPOSABLE) ×1 IMPLANT
IRRIG SUCT STRYKERFLOW 2 WTIP (MISCELLANEOUS) ×1
IRRIGATION SUCT STRKRFLW 2 WTP (MISCELLANEOUS) ×1 IMPLANT
KIT BASIN OR (CUSTOM PROCEDURE TRAY) ×1 IMPLANT
KIT TURNOVER KIT B (KITS) ×1 IMPLANT
NDL 22X1.5 STRL (OR ONLY) (MISCELLANEOUS) ×1 IMPLANT
NEEDLE 22X1.5 STRL (OR ONLY) (MISCELLANEOUS) ×1 IMPLANT
NS IRRIG 1000ML POUR BTL (IV SOLUTION) ×1 IMPLANT
PAD ARMBOARD 7.5X6 YLW CONV (MISCELLANEOUS) ×1 IMPLANT
POUCH RETRIEVAL ECOSAC 10 (ENDOMECHANICALS) ×1 IMPLANT
RELOAD 45 VASCULAR/THIN (ENDOMECHANICALS) ×1 IMPLANT
RELOAD STAPLE 45 2.5 WHT GRN (ENDOMECHANICALS) IMPLANT
SCISSORS LAP 5X35 DISP (ENDOMECHANICALS) IMPLANT
SET TUBE SMOKE EVAC HIGH FLOW (TUBING) ×1 IMPLANT
SHEARS HARMONIC ACE PLUS 36CM (ENDOMECHANICALS) ×1 IMPLANT
SPECIMEN JAR SMALL (MISCELLANEOUS) ×1 IMPLANT
SUT VIC AB 4-0 PS2 27 (SUTURE) ×1 IMPLANT
TOWEL GREEN STERILE (TOWEL DISPOSABLE) ×1 IMPLANT
TOWEL GREEN STERILE FF (TOWEL DISPOSABLE) ×1 IMPLANT
TRAY LAPAROSCOPIC MC (CUSTOM PROCEDURE TRAY) ×1 IMPLANT
TROCAR BALLN 12MMX100 BLUNT (TROCAR) ×1 IMPLANT
TROCAR Z THREAD OPTICAL 12X100 (TROCAR) ×1 IMPLANT
TROCAR Z-THREAD OPTICAL 5X100M (TROCAR) ×1 IMPLANT
WARMER LAPAROSCOPE (MISCELLANEOUS) ×1 IMPLANT
WATER STERILE IRR 1000ML POUR (IV SOLUTION) ×1 IMPLANT

## 2023-05-11 NOTE — Anesthesia Procedure Notes (Signed)
 Procedure Name: Intubation Date/Time: 05/11/2023 1:01 PM  Performed by: Cindie Donald CROME, CRNAPre-anesthesia Checklist: Patient identified, Emergency Drugs available, Suction available and Patient being monitored Patient Re-evaluated:Patient Re-evaluated prior to induction Oxygen Delivery Method: Circle System Utilized Preoxygenation: Pre-oxygenation with 100% oxygen Induction Type: IV induction Ventilation: Mask ventilation without difficulty Laryngoscope Size: Mac and 3 Grade View: Grade I Tube type: Oral Tube size: 7.0 mm Number of attempts: 1 Airway Equipment and Method: Stylet Placement Confirmation: ETT inserted through vocal cords under direct vision, positive ETCO2 and breath sounds checked- equal and bilateral Secured at: 22 cm Tube secured with: Tape Dental Injury: Teeth and Oropharynx as per pre-operative assessment

## 2023-05-11 NOTE — Anesthesia Preprocedure Evaluation (Addendum)
 Anesthesia Evaluation  Patient identified by MRN, date of birth, ID band Patient awake    Reviewed: Allergy  & Precautions, NPO status , Patient's Chart, lab work & pertinent test results  History of Anesthesia Complications Negative for: history of anesthetic complications  Airway Mallampati: II  TM Distance: >3 FB Neck ROM: Full    Dental  (+) Missing,    Pulmonary asthma , former smoker   Pulmonary exam normal        Cardiovascular hypertension, Pt. on medications and Pt. on home beta blockers Normal cardiovascular exam  '23 ECHO: EF 60 to 65%.  1. The LV has normal function, no regional wall motion abnormalities. There is moderate asymmetric LVH of the basal-septal segment.   2. RVF is normal. The right ventricular size is normal. There is normal pulmonary artery systolic pressure. The estimated right ventricular systolic pressure is 21.1 mmHg.   3. Left atrial size was severely dilated.   4. The mitral valve is normal in structure. Trivial MR. No evidence of MS.   5. The aortic valve is tricuspid. AI is trivial. No AS is present.     Neuro/Psych  PSYCHIATRIC DISORDERS (PTSD) Anxiety Depression       GI/Hepatic hiatal hernia,GERD  Controlled,,  Endo/Other    Renal/GU      Musculoskeletal   Abdominal   Peds  Hematology   Anesthesia Other Findings   Reproductive/Obstetrics                             Anesthesia Physical Anesthesia Plan  ASA: 2  Anesthesia Plan: General   Post-op Pain Management: Ofirmev  IV (intra-op)*   Induction: Intravenous  PONV Risk Score and Plan: 3 and Ondansetron , Dexamethasone and Treatment may vary due to age or medical condition  Airway Management Planned: Oral ETT  Additional Equipment: None  Intra-op Plan:   Post-operative Plan: Extubation in OR  Informed Consent: I have reviewed the patients History and Physical, chart, labs and discussed the  procedure including the risks, benefits and alternatives for the proposed anesthesia with the patient or authorized representative who has indicated his/her understanding and acceptance.     Dental advisory given  Plan Discussed with: CRNA  Anesthesia Plan Comments:         Anesthesia Quick Evaluation

## 2023-05-11 NOTE — ED Triage Notes (Signed)
 Pt endorses right sided abdominal pain for about 2-3 days, "sharp". Denies n/v or fevers. Mild diarrhea. No meds for symptoms.

## 2023-05-11 NOTE — Transfer of Care (Signed)
 Immediate Anesthesia Transfer of Care Note  Patient: Donna [name]  Procedure(s) Performed: APPENDECTOMY LAPAROSCOPIC (Abdomen)  Patient Location: PACU  Anesthesia Type:General  Level of Consciousness: awake, alert , oriented, and patient cooperative  Airway & Oxygen Therapy: Patient Spontanous Breathing  Post-op Assessment: Report given to RN and Post -op Vital signs reviewed and stable  Post vital signs: Reviewed and stable  Last Vitals:  Vitals Value Taken Time  BP 133/60 05/11/23 1338  Temp 36.5 C 05/11/23 1338  Pulse 64 05/11/23 1341  Resp 13 05/11/23 1341  SpO2 93 % 05/11/23 1341  Vitals shown include unfiled device data.  Last Pain:  Vitals:   05/11/23 1338  TempSrc:   PainSc: 0-No pain         Complications: No notable events documented.

## 2023-05-11 NOTE — Plan of Care (Signed)

## 2023-05-11 NOTE — Op Note (Signed)
  05/11/2023  1:25 PM  PATIENT:  Donna [name]  72 y.o. female  PRE-OPERATIVE DIAGNOSIS:  Acute Appendicitis  POST-OPERATIVE DIAGNOSIS:  Acute Appendicitis  PROCEDURE:  Procedure(s): APPENDECTOMY LAPAROSCOPIC  SURGEON:  Surgeon(s): Sebastian Moles, MD  ASSISTANTS: Waddell Collier, RNFA   ANESTHESIA:   local and general  EBL:  Total I/O In: 1300 [I.V.:250; IV Piggyback:1050] Out: 5 [Blood:5]  BLOOD ADMINISTERED:none  DRAINS: none   SPECIMEN:  Excision  DISPOSITION OF SPECIMEN:  PATHOLOGY  COUNTS:  YES  DICTATION: .Dragon Dictation Procedure in detail: Informed consent was obtained.  She received intravenous antibiotics.  She was brought to the operating room and general endotracheal anesthesia was administered by the anesthesia staff.  Her abdomen was prepped and draped in a sterile fashion.  We did a timeout procedure.The supraumbilical region was infiltrated with local. Supraumbilical incision was made. Subcutaneous tissues were dissected down revealing the anterior fascia. This was divided sharply along the midline. Peritoneal cavity was entered under direct vision without complication. A 0 Vicryl pursestring was placed around the fascial opening. Hassan trocar was inserted into the abdomen. The abdomen was insufflated with carbon dioxide in standard fashion. Under direct vision a 12 mm left lower quadrant and a 5 mm right mid abdominal port were placed.  Local was used at each port site.  Laparoscopic exploration revealed a very inflamed but not perforated appendix.  The mesoappendix was taken down using the harmonic scalpel.  Once the base was skeletonized it was divided with the Endo GIA with a vascular load.  The appendix was placed in a bag and removed from the abdomen.  It was sent to pathology.  The right lower quadrant area was irrigated.  There was excellent hemostasis.  The staple line on the cecum looked great.  Irrigation fluid was evacuated.  Ports were removed under  direct vision.  Pneumoperitoneum was released.  Supraumbilical fascia was closed by tying the pursestring.  All 3 wounds were then closed with 4-0 Vicryl followed by Dermabond.  All counts were correct.  She tolerated the procedure well without apparent complication was taken recovery in stable condition.  PATIENT DISPOSITION:  PACU - hemodynamically stable.   Delay start of Pharmacological VTE agent (>24hrs) due to surgical blood loss or risk of bleeding:  no  Moles Sebastian, MD, MPH, FACS Pager: [phone]  12/31/20241:25 PM

## 2023-05-11 NOTE — H&P (Signed)
 Donna [name] is an 72 y.o. female.   Chief Complaint: RLQ abdominal pIn HPI: 72yo F with PMHx as below presented to [facility] today C/O 3d HX R abdominal pain.  She underwent evaluation including labs which were unremarkable.  CT scan of the abdomen pelvis shows acute appendicitis.  She was accepted in transfer for surgical treatment.  Of note, she reports her husband is out of town because his mother had a heart attack.  She does not have anyone to stay with her after surgery.  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   chest - CX3 + 5FU   Squamous cell carcinoma of skin 09/27/2014   left thigh - tx p bx   Substance abuse (HCC)     Past Surgical History:  Procedure Laterality Date   ANKLE SURGERY     Bilateral    CATARACT EXTRACTION Bilateral    CERVICAL SPINE SURGERY  07/21/2021   TUBAL LIGATION      Family History  Problem Relation Age of Onset   Dementia Mother    Diabetes Mother    Melanoma Father    Colon cancer Father 69       Survived   Heart disease Father    Heart attack Father    Rheum arthritis Father    Dementia Father    Healthy Sister    Healthy Brother    Dementia Maternal Aunt    Dementia Maternal Uncle    Colon polyps Paternal Aunt    Pancreatic cancer Paternal Aunt    Rectal cancer Paternal Aunt    Colon cancer Paternal Aunt    Colon cancer Paternal Grandmother    Breast cancer Maternal  Grandmother    Stomach cancer Neg Hx    Esophageal cancer Neg Hx    Liver cancer Neg Hx    Social History:  reports that she quit smoking about 26 years ago. Her smoking use included cigarettes. She started smoking about 56 years ago. She has never used smokeless tobacco. She reports that she does not drink alcohol and does not use drugs.  Allergies:  Allergies  Allergen Reactions   Ciprofloxacin     Neuropathy    Corticosteroids Other (See Comments)    Unable to take oral, topical or injectable    Doxycycline Nausea Only   Gluten Meal     GI upset    Incruse Ellipta  [Umeclidinium Bromide ]     Urinary retention   Penicillins Nausea And Vomiting    breaking out   Prednisone Other (See Comments)    Hyperactivity, mood changes, nausea   Quinolones Other (See Comments)    REACTION: sick / neuropathy     Facility-Administered Medications Prior to Admission  Medication Dose Route Frequency Provider Last Rate Last Admin   0.9 %  sodium chloride   infusion  500 mL Intravenous Continuous Aneita Gwendlyn DASEN, MD       Medications Prior to Admission  Medication Sig Dispense Refill   ciclopirox (PENLAC) 8 % solution Apply 1 Application topically daily.     albuterol (VENTOLIN HFA) 108 (90 Base) MCG/ACT inhaler Inhale 2 puffs into the lungs every 4 (four) hours as needed.     b complex vitamins tablet Take 1 tablet by mouth daily.       calcium  gluconate 500 MG tablet Take 500 mg by mouth daily.     estradiol  (ESTRACE ) 0.1 MG/GM vaginal cream SMARTSIG:1 Applicator Vaginal Once a Week     lisinopril  (ZESTRIL ) 5 MG tablet TAKE 1 TABLET BY MOUTH DAILY FOR ELEVATED BP WHILE TRAVELING 90 tablet 1   Loratadine (CLARITIN PO) Take by mouth.     Magnesium 500 MG TABS Take 1 tablet by mouth daily.       Multiple Vitamin (MULTIVITAMIN) capsule Take 1 capsule by mouth daily.       propranolol  (INDERAL ) 10 MG tablet Take 10 mg by mouth as needed.     valACYclovir  (VALTREX ) 500 MG tablet ONE BY MOUTH  TWICE A DAY X 5 DAYS FOR HERPES OUTBREAK 10 tablet prn    Results for orders placed or performed during the hospital encounter of 05/11/23 (from the past 48 hours)  Lipase, blood     Status: None   Collection Time: 05/11/23  6:41 AM  Result Value Ref Range   Lipase 25 11 - 51 U/L    Comment: Performed at [facility], [address] [place], [place] [address]  Comprehensive metabolic panel     Status: Abnormal   Collection Time: 05/11/23  6:41 AM  Result Value Ref Range   Sodium 131 (L) 135 - 145 mmol/L   Potassium 4.0 3.5 - 5.1 mmol/L   Chloride 94 (L) 98 - 111 mmol/L   CO2 30 22 - 32 mmol/L   Glucose, Bld 109 (H) 70 - 99 mg/dL    Comment: Glucose reference range applies only to samples taken after fasting for at least 8 hours.   BUN 18 8 - 23 mg/dL   Creatinine, Ser 9.28 0.44 - 1.00 mg/dL   Calcium  9.2 8.9 - 10.3 mg/dL   Total Protein 7.0 6.5 - 8.1 g/dL   Albumin 4.3 3.5 - 5.0 g/dL   AST 19 15 - 41 U/L   ALT 14 0 - 44 U/L   Alkaline Phosphatase 55 38 - 126 U/L   Total Bilirubin 0.9 0.0 - 1.2 mg/dL   GFR, Estimated >39 >39 mL/min    Comment: (NOTE) Calculated using the CKD-EPI Creatinine Equation (2021)    Anion gap 7 5 - 15    Comment: Performed at [facility], [address], [place], [place] [address]  CBC     Status: None   Collection Time: 05/11/23  6:41 AM  Result Value Ref Range   WBC 7.7 4.0 - 10.5 K/uL   RBC 4.24 3.87 - 5.11 MIL/uL   Hemoglobin 12.8 12.0 - 15.0 g/dL   HCT 62.0 63.9 - 53.9 %   MCV 89.4 80.0 - 100.0 fL   MCH 30.2 26.0 - 34.0 pg   MCHC 33.8 30.0 - 36.0 g/dL   RDW 87.6 88.4 - 84.4 %   Platelets 258 150 - 400 K/uL   nRBC 0.0 0.0 - 0.2 %    Comment: Performed at [facility], [address], [place], [place]  [address]  Urinalysis, Routine w reflex microscopic -Urine, Clean Catch     Status: None   Collection Time: 05/11/23  6:41 AM  Result Value Ref Range   Color, Urine YELLOW YELLOW    APPearance CLEAR CLEAR   Specific Gravity, Urine 1.009 1.005 - 1.030   pH 7.0 5.0 - 8.0   Glucose, UA NEGATIVE NEGATIVE mg/dL   Hgb urine dipstick NEGATIVE NEGATIVE   Bilirubin Urine NEGATIVE NEGATIVE   Ketones, ur NEGATIVE NEGATIVE mg/dL   Protein, ur NEGATIVE NEGATIVE mg/dL   Nitrite NEGATIVE NEGATIVE   Leukocytes,Ua NEGATIVE NEGATIVE    Comment: Performed at [facility], [address] [place], [place] [address]  D-dimer, quantitative     Status: Abnormal   Collection Time: 05/11/23  7:50 AM  Result Value Ref Range   D-Dimer, Quant 0.60 (H) 0.00 - 0.50 ug/mL-FEU    Comment: (NOTE) At the manufacturer cut-off value of 0.5 g/mL FEU, this assay has a negative predictive value of 95-100%.This assay is intended for use in conjunction with a clinical pretest probability (PTP) assessment model to exclude pulmonary embolism (PE) and deep venous thrombosis (DVT) in outpatients suspected of PE or DVT. Results should be correlated with clinical presentation. Performed at [facility], [address] [place], [place] [address]    CT ABDOMEN PELVIS W CONTRAST Result Date: 05/11/2023 CLINICAL DATA:  RLQ pain. EXAM: CT ABDOMEN AND PELVIS WITH CONTRAST TECHNIQUE: Multidetector CT imaging of the abdomen and pelvis was performed using the standard protocol following bolus administration of intravenous contrast. RADIATION DOSE REDUCTION: This exam was performed according to the departmental dose-optimization program which includes automated exposure control, adjustment of the mA and/or kV according to patient size and/or use of iterative reconstruction technique. CONTRAST:  85mL OMNIPAQUE  IOHEXOL  300 MG/ML  SOLN COMPARISON:  08/09/2017. FINDINGS: Lower chest: Lung bases clear.  No pleural or pericardial effusions. Hepatobiliary: No focal liver abnormality is seen. No gallstones, gallbladder wall thickening, or biliary dilatation. Pancreas: Unremarkable. No  pancreatic ductal dilatation or surrounding inflammatory changes. Spleen: Normal in size without focal abnormality. Adrenals/Urinary Tract: Adrenal glands are unremarkable. Kidneys are normal, without renal calculi, focal lesion, or hydronephrosis. Bladder is unremarkable. Stomach/Bowel: Unremarkable appearance of the stomach. No bowel dilatation to suggest obstruction. Diverticula in the descending and sigmoid colon. The appendix is abnormally distended with periappendiceal fat stranding consistent with acute appendicitis. No abscess. Vascular/Lymphatic: Dense atheromatous calcifications of the abdominal aorta and its branches. No suspicious adenopathy. Reproductive: Uterus and bilateral adnexa are unremarkable. Other: No abdominal wall hernia or abnormality. No abdominopelvic ascites. Musculoskeletal: No acute or significant osseous findings. IMPRESSION: 1. Acute appendicitis. 2. Diverticulosis. 3. Aortic atherosclerosis (ICD10-I70.0). Electronically Signed   By: Fonda Field M.D.   On: 05/11/2023 08:51    Review of Systems  Constitutional:  Positive for appetite change.  HENT: Negative.    Eyes: Negative.   Respiratory: Negative.    Cardiovascular: Negative.   Gastrointestinal:  Positive for abdominal pain. Negative for nausea and vomiting.  Endocrine: Negative.   Genitourinary: Negative.   Musculoskeletal: Negative.   Skin: Negative.   Allergic/Immunologic: Negative.   Neurological: Negative.   Hematological: Negative.   Psychiatric/Behavioral: Negative.      Blood pressure (!) 156/65, pulse 60, temperature 98 F (36.7 C), temperature source Oral, resp. rate 16, height 5' 7 (1.702 m), weight 70.3 kg, SpO2 100%. Physical Exam HENT:     Head: Normocephalic.  Cardiovascular:     Rate and Rhythm: Normal rate and  regular rhythm.  Pulmonary:     Effort: Pulmonary effort is normal.     Breath sounds: Normal breath sounds.  Abdominal:     Palpations: Abdomen is soft.     Tenderness:  There is abdominal tenderness in the right lower quadrant. There is no guarding or rebound.  Skin:    General: Skin is warm.     Capillary Refill: Capillary refill takes 2 to 3 seconds.  Neurological:     Mental Status: She is alert and oriented to person, place, and time.  Psychiatric:        Mood and Affect: Mood normal.      Assessment/Plan Appendicitis -IV Zosyn  given at [facility].  Will proceed with laparoscopic appendectomy.  I discussed the procedure, risks, and benefits with her in detail.  I answered her questions.  I also discussed the expected postoperative course.  She is agreeable.  Her husband is out of town with his mother who had a heart attack so she will need to stay overnight as she does not have anyone to stay with her postoperatively.  Dann FORBES Hummer, MD 05/11/2023, 12:24 PM

## 2023-05-11 NOTE — Anesthesia Postprocedure Evaluation (Signed)
 Anesthesia Post Note  Patient: Donna [name]  Procedure(s) Performed: APPENDECTOMY LAPAROSCOPIC (Abdomen)     Patient location during evaluation: PACU Anesthesia Type: General Level of consciousness: awake and alert Pain management: pain level controlled Vital Signs Assessment: post-procedure vital signs reviewed and stable Respiratory status: spontaneous breathing, nonlabored ventilation and respiratory function stable Cardiovascular status: blood pressure returned to baseline Postop Assessment: no apparent nausea or vomiting Anesthetic complications: no   No notable events documented.  Last Vitals:  Vitals:   05/11/23 1415 05/11/23 1430  BP: 136/78 (!) 156/76  Pulse: (!) 58 61  Resp: 12 12  Temp:  36.5 C  SpO2: 100% 100%    Last Pain:  Vitals:   05/11/23 1345  TempSrc:   PainSc: 8                  Vertell Row

## 2023-05-11 NOTE — ED Provider Notes (Signed)
 [place] EMERGENCY DEPARTMENT AT [facility] Provider Note   CSN: [id number] Arrival date & time: 05/11/23  9384     History  Chief Complaint  Patient presents with   Abdominal Pain    Donna [name] is a 72 y.o. female.   Abdominal Pain Associated symptoms: nausea      72 year old female with medical history significant for 2 to 3 days of right-sided abdominal pain.  She endorses right upper quadrant pain primarily that radiates somewhat to her back, no radiation to the shoulder.  Some association with eating.  She has also had some mildly loose stools.  She denies any nausea or vomiting.  No anorexia.  Pain is described as sharp.  Home Medications Prior to Admission medications   Medication Sig Start Date End Date Taking? Authorizing Provider  ciclopirox (PENLAC) 8 % solution Apply 1 Application topically daily. 05/01/23  Yes [provider]  albuterol (VENTOLIN HFA) 108 (90 Base) MCG/ACT inhaler Inhale 2 puffs into the lungs every 4 (four) hours as needed. 08/04/21   [provider]  b complex vitamins tablet Take 1 tablet by mouth daily.      [provider]  calcium  gluconate 500 MG tablet Take 500 mg by mouth daily.    [provider]  estradiol  (ESTRACE ) 0.1 MG/GM vaginal cream SMARTSIG:1 Applicator Vaginal Once a Week 01/14/21   [provider]  lisinopril  (ZESTRIL ) 5 MG tablet TAKE 1 TABLET BY MOUTH DAILY FOR ELEVATED BP WHILE TRAVELING 11/05/19   Perri Ronal PARAS, MD  Loratadine (CLARITIN PO) Take by mouth.    [provider]  Magnesium 500 MG TABS Take 1 tablet by mouth daily.      [provider]  Multiple Vitamin (MULTIVITAMIN) capsule Take 1 capsule by mouth daily.      [provider]  propranolol  (INDERAL ) 10 MG tablet Take 10 mg by mouth as needed. 12/02/20   [provider]  valACYclovir  (VALTREX ) 500 MG tablet ONE BY MOUTH TWICE A DAY X 5 DAYS FOR HERPES OUTBREAK 11/25/19    Perri Ronal PARAS, MD      Allergies    Ciprofloxacin, Corticosteroids, Doxycycline, Gluten meal, Incruse ellipta  [umeclidinium bromide ], Penicillins, Prednisone, and Quinolones    Review of Systems   Review of Systems  Gastrointestinal:  Positive for abdominal pain and nausea.  All other systems reviewed and are negative.   Physical Exam Updated Vital Signs BP 126/68 (BP Location: Right Arm)   Pulse (!) 59   Temp 98 F (36.7 C) (Oral)   Resp 16   Ht 5' 7 (1.702 m)   Wt 70.3 kg   SpO2 97%   BMI 24.28 kg/m  Physical Exam Vitals and nursing note reviewed.  Constitutional:      General: She is not in acute distress.    Appearance: She is well-developed.  HENT:     Head: Normocephalic and atraumatic.  Eyes:     Conjunctiva/sclera: Conjunctivae normal.  Cardiovascular:     Rate and Rhythm: Normal rate and regular rhythm.     Heart sounds: No murmur heard. Pulmonary:     Effort: Pulmonary effort is normal. No respiratory distress.     Breath sounds: Normal breath sounds.  Abdominal:     Palpations: Abdomen is soft.     Tenderness: There is abdominal tenderness in the right upper quadrant and right lower quadrant. There is guarding. There is no rebound. Positive signs include Murphy's sign and McBurney's sign.  Musculoskeletal:  General: No swelling.     Cervical back: Neck supple.  Skin:    General: Skin is warm and dry.     Capillary Refill: Capillary refill takes less than 2 seconds.  Neurological:     Mental Status: She is alert.  Psychiatric:        Mood and Affect: Mood normal.     ED Results / Procedures / Treatments   Labs (all labs ordered are listed, but only abnormal results are displayed) Labs Reviewed  COMPREHENSIVE METABOLIC PANEL - Abnormal; Notable for the following components:      Result Value   Sodium 131 (*)    Chloride 94 (*)    Glucose, Bld 109 (*)    All other components within normal limits  D-DIMER, QUANTITATIVE - Abnormal;  Notable for the following components:   D-Dimer, Quant 0.60 (*)    All other components within normal limits  LIPASE, BLOOD  CBC  URINALYSIS, ROUTINE W REFLEX MICROSCOPIC    EKG None  Radiology CT ABDOMEN PELVIS W CONTRAST Result Date: 05/11/2023 CLINICAL DATA:  RLQ pain. EXAM: CT ABDOMEN AND PELVIS WITH CONTRAST TECHNIQUE: Multidetector CT imaging of the abdomen and pelvis was performed using the standard protocol following bolus administration of intravenous contrast. RADIATION DOSE REDUCTION: This exam was performed according to the departmental dose-optimization program which includes automated exposure control, adjustment of the mA and/or kV according to patient size and/or use of iterative reconstruction technique. CONTRAST:  85mL OMNIPAQUE  IOHEXOL  300 MG/ML  SOLN COMPARISON:  08/09/2017. FINDINGS: Lower chest: Lung bases clear.  No pleural or pericardial effusions. Hepatobiliary: No focal liver abnormality is seen. No gallstones, gallbladder wall thickening, or biliary dilatation. Pancreas: Unremarkable. No pancreatic ductal dilatation or surrounding inflammatory changes. Spleen: Normal in size without focal abnormality. Adrenals/Urinary Tract: Adrenal glands are unremarkable. Kidneys are normal, without renal calculi, focal lesion, or hydronephrosis. Bladder is unremarkable. Stomach/Bowel: Unremarkable appearance of the stomach. No bowel dilatation to suggest obstruction. Diverticula in the descending and sigmoid colon. The appendix is abnormally distended with periappendiceal fat stranding consistent with acute appendicitis. No abscess. Vascular/Lymphatic: Dense atheromatous calcifications of the abdominal aorta and its branches. No suspicious adenopathy. Reproductive: Uterus and bilateral adnexa are unremarkable. Other: No abdominal wall hernia or abnormality. No abdominopelvic ascites. Musculoskeletal: No acute or significant osseous findings. IMPRESSION: 1. Acute appendicitis. 2.  Diverticulosis. 3. Aortic atherosclerosis (ICD10-I70.0). Electronically Signed   By: Fonda Field M.D.   On: 05/11/2023 08:51    Procedures Procedures    Medications Ordered in ED Medications  ondansetron  (ZOFRAN ) injection 4 mg (has no administration in time range)  fentaNYL  (SUBLIMAZE ) injection 50 mcg (50 mcg Intravenous Given 05/11/23 1011)  piperacillin -tazobactam (ZOSYN ) IVPB 3.375 g (has no administration in time range)  fentaNYL  (SUBLIMAZE ) injection 50 mcg (50 mcg Intravenous Given 05/11/23 0750)  lactated ringers  bolus 1,000 mL (0 mLs Intravenous Stopped 05/11/23 0919)  iohexol  (OMNIPAQUE ) 300 MG/ML solution 100 mL (85 mLs Intravenous Contrast Given 05/11/23 0813)  piperacillin -tazobactam (ZOSYN ) IVPB 3.375 g (3.375 g Intravenous New Bag/Given 05/11/23 1016)    ED Course/ Medical Decision Making/ A&P                                 Medical Decision Making Amount and/or Complexity of Data Reviewed Labs: ordered. Radiology: ordered.  Risk Prescription drug management.    72 year old female with medical history significant for 2 to 3 days of right-sided abdominal pain.  She endorses right upper quadrant pain primarily that radiates somewhat to her back, no radiation to the shoulder.  Some association with eating.  She has also had some mildly loose stools.  She denies any nausea or vomiting.  No anorexia.  Pain is described as sharp.  On arrival, the patient was vitally stable, afebrile, not tachycardic or tachypneic, saturating well on room air, hemodynamically stable.  Physical exam revealed right upper quadrant and right lower quadrant tenderness, more focality of tenderness to the right lower quadrant although she did have a positive Murphy sign on exam.  Patient is guarding.  Concern for intra-abdominal abnormality such as appendicitis, cholecystitis.  Considered PE with some pain underneath the ribs on the right.  Access obtained and the patient was administered IV  fluid bolus in addition to IV pain medication.  Laboratory evaluation revealed D-dimer negative after adjustment for age, urinalysis negative, lipase normal, CBC without a leukocytosis or anemia, CMP with only mild hyponatremia 131, otherwise unremarkable.  CT abdomen pelvis was concerning for acute appendicitis.  The patient was administered IV Zosyn .  She has a remote childhood history of due to penicillins but after discussion with pharmacy will administer Zosyn  and monitor for reaction.  General surgery was consulted.  I spoke with Glendale Mais, PA who will get back to me regarding which psych the patient will go to, [facility] or [facility].  I spoke with Dr. Dann Hummer subsequently who stated the patient needs to be transported to the preop holding area for management surgically of her acute appendicitis.  [facility] notified for transport.  Patient informed of the diagnosis, hemodynamically stable at time of transport, pain controlled with fentanyl .   Final Clinical Impression(s) / ED Diagnoses Final diagnoses:  Acute appendicitis with localized peritonitis, unspecified whether abscess present, unspecified whether gangrene present, unspecified whether perforation present    Rx / DC Orders ED Discharge Orders     None         Jerrol Agent, MD 05/11/23 1121

## 2023-05-11 NOTE — ED Notes (Signed)
[facility] will be transporting pt now, [facility] to be notified

## 2023-05-12 ENCOUNTER — Encounter (HOSPITAL_COMMUNITY): Payer: Self-pay | Admitting: General Surgery

## 2023-05-12 LAB — BASIC METABOLIC PANEL
Anion gap: 7 (ref 5–15)
BUN: 12 mg/dL (ref 8–23)
CO2: 25 mmol/L (ref 22–32)
Calcium: 8.4 mg/dL — ABNORMAL LOW (ref 8.9–10.3)
Chloride: 98 mmol/L (ref 98–111)
Creatinine, Ser: 0.8 mg/dL (ref 0.44–1.00)
GFR, Estimated: >60 mL/min (ref >60)
Glucose, Bld: 112 mg/dL — ABNORMAL HIGH (ref 70–99)
Potassium: 3.8 mmol/L (ref 3.5–5.1)
Sodium: 130 mmol/L — ABNORMAL LOW (ref 135–145)

## 2023-05-12 LAB — CBC
HCT: 33.3 % — ABNORMAL LOW (ref 36.0–46.0)
Hemoglobin: 11.3 g/dL — ABNORMAL LOW (ref 12.0–15.0)
MCH: 30.1 pg (ref 26.0–34.0)
MCHC: 33.9 g/dL (ref 30.0–36.0)
MCV: 88.8 fL (ref 80.0–100.0)
Platelets: 233 10*3/uL (ref 150–400)
RBC: 3.75 MIL/uL — ABNORMAL LOW (ref 3.87–5.11)
RDW: 12.1 % (ref 11.5–15.5)
WBC: 5.4 10*3/uL (ref 4.0–10.5)
nRBC: 0 % (ref 0.0–0.2)

## 2023-05-12 MED ORDER — OXYCODONE HCL 5 MG PO TABS: 5.0000 mg | ORAL_TABLET | Freq: Four times a day (QID) | ORAL | 0 refills | Status: DC | PRN

## 2023-05-12 MED ORDER — ACETAMINOPHEN 500 MG PO TABS: 1000.0000 mg | ORAL_TABLET | Freq: Three times a day (TID) | ORAL | Status: AC

## 2023-05-12 NOTE — Discharge Instructions (Signed)

## 2023-05-12 NOTE — Discharge Summary (Signed)
 Physician Discharge Summary  Donna [name] FMW:[id number] DOB: [birth year] DOA: 05/11/2023  PCP: Gerome Brunet, DO  Admit date: 05/11/2023 Discharge date: 05/12/2023  Recommendations for Outpatient Follow-up:     Follow-up Information     Surgery, [place]. Schedule an appointment as soon as possible for a visit in 2 week(s).   Specialty: General Surgery Why: For wound re-check For [facility] Contact information: [address] [place] [address] [phone]                Discharge Diagnoses:  Acute appendicitis s/p lap appy 05/11/23  Surgical Procedure: lap appendectomy 12/31 Dr Sebastian  Discharge Condition: good Disposition: home  Diet recommendation: regular  Filed Weights   05/11/23 9361 05/11/23 1239  Weight: 70.3 kg 70.3 kg    History of present illness: 73yo F with PMHx as below presented to [facility] today C/O 3d HX R abdominal pain.  She underwent evaluation including labs which were unremarkable.  CT scan of the abdomen pelvis shows acute appendicitis.  She was accepted in transfer for surgical treatment.  Of note, she reports her husband is out of town because his mother had a heart attack.  She does not have anyone to stay with her after surgery.    Hospital Course:  Patient was taken urgently to the operating room for laparoscopic appendectomy by Dr. Sebastian.  Please see his operative note for further details.  She was kept overnight for observation.  On postoperative day 1 she was tolerating a diet.  She had ambulated in the room.  She had voided spontaneously.  Her pain was controlled with oral medication.  I felt she was stable for discharge.  We discussed discharge instructions.  BP 118/72 (BP Location: Left Arm)   Pulse 70   Temp 98.2 F (36.8 C)   Resp 18   Ht 5' 7 (1.702 m)   Wt 70.3 kg   SpO2 94%   BMI 24.28 kg/m   Gen: alert, NAD, non-toxic appearing Pupils: equal, no scleral icterus Pulm: Lungs  clear to auscultation, symmetric chest rise CV: regular rate and rhythm Abd: soft, mild approp tender, nondistended.  No cellulitis. No incisional hernia Ext: no edema, no calf tenderness Skin: no rash, no jaundice    Discharge Instructions  Discharge Instructions     Call MD for:   Complete by: As directed    Temperature >101   Call MD for:  hives   Complete by: As directed    Call MD for:  persistant dizziness or light-headedness   Complete by: As directed    Call MD for:  persistant nausea and vomiting   Complete by: As directed    Call MD for:  redness, tenderness, or signs of infection (pain, swelling, redness, odor or green/yellow discharge around incision site)   Complete by: As directed    Call MD for:  severe uncontrolled pain   Complete by: As directed    Diet - low sodium heart healthy   Complete by: As directed    Discharge instructions   Complete by: As directed    See [facility] discharge instructions   Increase activity slowly   Complete by: As directed       Allergies as of 05/12/2023       Reactions   Ciprofloxacin    Neuropathy    Corticosteroids Other (See Comments)   Unable to take oral, topical or injectable    Doxycycline Nausea Only  Gluten Meal    GI upset    Incruse Ellipta  [umeclidinium Bromide ]    Urinary retention   Penicillins Nausea And Vomiting   breaking out   Prednisone Other (See Comments)   Hyperactivity, mood changes, nausea   Quinolones Other (See Comments)   REACTION: sick / neuropathy        Medication List     TAKE these medications    acetaminophen  500 MG tablet Commonly known as: TYLENOL  Take 2 tablets (1,000 mg total) by mouth every 8 (eight) hours for 5 days.   albuterol 108 (90 Base) MCG/ACT inhaler Commonly known as: VENTOLIN HFA Inhale 2 puffs into the lungs every 4 (four) hours as needed.   b complex vitamins tablet Take 1 tablet by mouth daily.   calcium  gluconate 500 MG tablet Take 500 mg by mouth  daily.   ciclopirox 8 % solution Commonly known as: PENLAC Apply 1 Application topically daily. For the toenail   lisinopril  5 MG tablet Commonly known as: ZESTRIL  TAKE 1 TABLET BY MOUTH DAILY FOR ELEVATED BP WHILE TRAVELING   loratadine 10 MG tablet Commonly known as: CLARITIN Take 10 mg by mouth daily as needed (allergy  season).   Magnesium 500 MG Tabs Take 1 tablet by mouth daily.   methocarbamol  500 MG tablet Commonly known as: ROBAXIN  Take 1 tablet by mouth daily as needed.   multivitamin capsule Take 1 capsule by mouth daily.   oxyCODONE  5 MG immediate release tablet Commonly known as: Oxy IR/ROXICODONE  Take 1 tablet (5 mg total) by mouth every 6 (six) hours as needed for severe pain (pain score 7-10).   propranolol  10 MG tablet Commonly known as: INDERAL  Take 10 mg by mouth as needed.   valACYclovir  500 MG tablet Commonly known as: VALTREX  ONE BY MOUTH TWICE A DAY X 5 DAYS FOR HERPES OUTBREAK   Voltaren 1 % Gel Generic drug: diclofenac Sodium Apply 2 g topically daily as needed.   Yuvafem  10 MCG Tabs vaginal tablet Generic drug: Estradiol  Place 1 tablet vaginally once a week. On Sundays        Follow-up Information     Surgery, [place]. Schedule an appointment as soon as possible for a visit in 2 week(s).   Specialty: General Surgery Why: For wound re-check [facility] Contact information: [address] [place] [address] [phone]                  The results of significant diagnostics from this hospitalization (including imaging, microbiology, ancillary and laboratory) are listed below for reference.    Significant Diagnostic Studies: CT ABDOMEN PELVIS W CONTRAST Result Date: 05/11/2023 CLINICAL DATA:  RLQ pain. EXAM: CT ABDOMEN AND PELVIS WITH CONTRAST TECHNIQUE: Multidetector CT imaging of the abdomen and pelvis was performed using the standard protocol following bolus administration of intravenous  contrast. RADIATION DOSE REDUCTION: This exam was performed according to the departmental dose-optimization program which includes automated exposure control, adjustment of the mA and/or kV according to patient size and/or use of iterative reconstruction technique. CONTRAST:  85mL OMNIPAQUE  IOHEXOL  300 MG/ML  SOLN COMPARISON:  08/09/2017. FINDINGS: Lower chest: Lung bases clear.  No pleural or pericardial effusions. Hepatobiliary: No focal liver abnormality is seen. No gallstones, gallbladder wall thickening, or biliary dilatation. Pancreas: Unremarkable. No pancreatic ductal dilatation or surrounding inflammatory changes. Spleen: Normal in size without focal abnormality. Adrenals/Urinary Tract: Adrenal glands are unremarkable. Kidneys are normal, without renal calculi, focal lesion, or hydronephrosis. Bladder is unremarkable.  Stomach/Bowel: Unremarkable appearance of the stomach. No bowel dilatation to suggest obstruction. Diverticula in the descending and sigmoid colon. The appendix is abnormally distended with periappendiceal fat stranding consistent with acute appendicitis. No abscess. Vascular/Lymphatic: Dense atheromatous calcifications of the abdominal aorta and its branches. No suspicious adenopathy. Reproductive: Uterus and bilateral adnexa are unremarkable. Other: No abdominal wall hernia or abnormality. No abdominopelvic ascites. Musculoskeletal: No acute or significant osseous findings. IMPRESSION: 1. Acute appendicitis. 2. Diverticulosis. 3. Aortic atherosclerosis (ICD10-I70.0). Electronically Signed   By: Fonda Field M.D.   On: 05/11/2023 08:51    Microbiology: No results found for this or any previous visit (from the past 240 hours).   Labs: Basic Metabolic Panel: Recent Labs  Lab 05/11/23 0641 05/12/23 0808  NA 131* 130*  K 4.0 3.8  CL 94* 98  CO2 30 25  GLUCOSE 109* 112*  BUN 18 12  CREATININE 0.71 0.80  CALCIUM  9.2 8.4*   Liver Function Tests: Recent Labs  Lab  05/11/23 0641  AST 19  ALT 14  ALKPHOS 55  BILITOT 0.9  PROT 7.0  ALBUMIN 4.3   Recent Labs  Lab 05/11/23 0641  LIPASE 25   No results for input(s): AMMONIA in the last 168 hours. CBC: Recent Labs  Lab 05/11/23 0641 05/12/23 0808  WBC 7.7 5.4  HGB 12.8 11.3*  HCT 37.9 33.3*  MCV 89.4 88.8  PLT 258 233   Cardiac Enzymes: No results for input(s): CKTOTAL, CKMB, CKMBINDEX, TROPONINI in the last 168 hours. BNP: BNP (last 3 results) No results for input(s): BNP in the last 8760 hours.  ProBNP (last 3 results) No results for input(s): PROBNP in the last 8760 hours.  CBG: No results for input(s): GLUCAP in the last 168 hours.  Principal Problem:   S/P appendectomy Active Problems:   S/P laparoscopic appendectomy   Time coordinating discharge: 20 min  Signed:  Camellia CHRISTELLA Blush, MD [facility],  [facility] [phone] 05/12/2023, 2:55 PM

## 2023-05-14 LAB — SURGICAL PATHOLOGY

## 2023-05-19 ENCOUNTER — Ambulatory Visit: Payer: Medicare HMO | Admitting: Sports Medicine

## 2023-05-21 ENCOUNTER — Ambulatory Visit (HOSPITAL_BASED_OUTPATIENT_CLINIC_OR_DEPARTMENT_OTHER): Payer: Medicare HMO | Admitting: Pulmonary Disease

## 2023-05-26 ENCOUNTER — Encounter: Payer: Self-pay | Admitting: Sports Medicine

## 2023-05-26 ENCOUNTER — Ambulatory Visit: Payer: Medicare HMO | Admitting: Sports Medicine

## 2023-05-26 DIAGNOSIS — M7632 Iliotibial band syndrome, left leg: Secondary | ICD-10-CM | POA: Diagnosis not present

## 2023-05-26 DIAGNOSIS — M25552 Pain in left hip: Secondary | ICD-10-CM | POA: Diagnosis not present

## 2023-05-26 NOTE — Progress Notes (Signed)
 Patient says that she is feeling a bit better and her leg is less tender. She had an emergency appendectomy on New Years Eve, so her activity has been limited because of that. She has been on two walks outside since then, both around 4,000-5,000 steps, and said that those felt okay.

## 2023-05-26 NOTE — Progress Notes (Signed)
 Donna [name] - 73 y.o. female MRN [id number]  Date of birth: Dec 31, 1950  Office Visit Note: Visit Date: 05/26/2023 PCP: Pete Brand, DO Referred by: Pete Brand, DO  Subjective: Chief Complaint  Patient presents with   Left Hip - Pain, Follow-up   HPI: Donna [name] is a pleasant 73 y.o. female who presents today for follow-up of left lateral hip/IT-band syndrome.  Marceil unfortunately had a exciting meals before she had to undergo emergency appendectomy.  Her activity has been limited because of that.  Regardless, she states her lateral hip pain is markedly improved.  He has been able to cross her legs over better.  She has been walking only for this as instructed patient.  No issues with that leg with this.  Pertinent ROS were reviewed with the patient and found to be negative unless otherwise specified above in HPI.   Assessment & Plan: Visit Diagnoses:  1. It band syndrome, left   2. Pain of left hip    Plan: Impression is improving lateral hip pain with IT band syndrome.  She did respond very positively to our first trial of extracorporeal shockwave therapy, we did repeat this today.  Her pain was so improved she actually had difficulty palpating or localizing with her pain was.  If she continues to get relief and is no longer having any pain, she may hold off from additional treatments.  Will likely give her some IT band stretching to perform but will hold to reduce Valsalva given her recent surgery.  She can follow-up next week if she needs additional shockwave and evaluation, otherwise can follow-up as needed.  Can use Tylenol  if needed.  Follow-up: Return in about 1 week (around 06/02/2023) for ITB/lat hip.   Meds & Orders: No orders of the defined types were placed in this encounter.  No orders of the defined types were placed in this encounter.    Procedures: Procedure: ECSWT Indications:  ITB syndrome   Procedure Details Consent: Risks of procedure as well as  the alternatives and risks of each were explained to the patient.  Verbal consent for procedure obtained. Time Out: Verified patient identification, verified procedure, site was marked, verified correct patient position. The area was cleaned with alcohol swab.     The left ITBand was targeted for Extracorporeal shockwave therapy.    Preset: Muscular injury Power Level: 100 mJ  Frequency: 10 Hz Impulse/cycles: 2800 Head size: Regular   Patient tolerated procedure well without immediate complications.      Clinical History: No specialty comments available.  She reports that she quit smoking about 26 years ago. Her smoking use included cigarettes. She started smoking about 56 years ago. She has never used smokeless tobacco. No results for input(s): "HGBA1C", "LABURIC" in the last 8760 hours.  Objective:    Physical Exam  Gen: Well-appearing, in no acute distress; non-toxic CV: Well-perfused. Warm.  Resp: Breathing unlabored on room air; no wheezing. Psych: Fluid speech in conversation; appropriate affect; normal thought process  Ortho Exam - Left hip: There is improved fascial restriction over the IT band, this is nontender to palpation today.  There is fairly fluid internal and external logroll about the hip.   Imaging: No results found.  Past Medical/Family/Surgical/Social History: Medications & Allergies reviewed per EMR, new medications updated. Patient Active Problem List   Diagnosis Date Noted   S/P appendectomy 05/11/2023   S/P laparoscopic appendectomy 05/11/2023   Gasping for breath 08/19/2021   PTSD (post-traumatic stress disorder)  08/19/2021   Parasomnia overlap disorder 08/19/2021   Snoring 08/19/2021   Excessive daytime sleepiness 08/19/2021   Sleep paralysis, recurrent isolated 08/19/2021   Asthma 02/19/2021   Multiple drug allergies 02/19/2021   Gluten intolerance 02/19/2021   Chronic rhinitis 02/19/2021   Metatarsalgia of left foot 11/14/2020   Achilles  tendinosis of right lower extremity 11/14/2020   Essential tremor 07/04/2019   DOE (dyspnea on exertion) 01/20/2019   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 06/13/2018   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   Superficial basal cell carcinoma 06/13/2018   Upper airway cough syndrome 06/13/2018   Atrophic vaginitis 02/17/2018   Bilateral leg cramps 02/17/2018   Fatty liver, alcoholic 04/22/2016   Depression 04/19/2016   Withdrawal complaint 04/19/2016   Hx of adenomatous colonic polyps 08/03/2015   Herpes simplex 06/15/2014   GERD (gastroesophageal reflux disease) 10/14/2013   Anxiety state 10/14/2013   Recovering alcoholic in remission (HCC) 10/14/2013   Allergic rhinitis 10/14/2013   Adjustment disorder with anxiety 10/14/2013   Elevated low-density lipoprotein level 10/14/2013   Essential hypertension 10/15/2012   Heart murmur 05/25/2012   Past Medical History:  Diagnosis Date   Anxiety    Asthma    Basal cell carcinoma 06/29/2017   right lip - CX3 + 5FU   BCC (basal cell carcinoma of skin) 10/29/2022   left lower leg, posterior - Mohs 01/27/2023   Cataract    bilateral lens implants   Cough variant asthma    Depression    GERD (gastroesophageal reflux disease)    Heart murmur    Hiatal hernia    Hyperlipidemia    IBS (irritable bowel syndrome)    SCC (squamous cell carcinoma) 10/29/2022   right lower leg, anterior - Mohs 12/28/2022 Dr Gaylyn Keas   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   chest - CX3 + 5FU   Squamous cell carcinoma of skin 09/27/2014   left thigh - tx p bx   Substance abuse (HCC)    Family History  Problem Relation Age of Onset   Dementia Mother    Diabetes Mother    Melanoma Father    Colon cancer Father 15       Survived   Heart disease Father    Heart attack Father    Rheum arthritis Father    Dementia Father    Healthy Sister    Healthy Brother    Dementia Maternal Aunt     Dementia Maternal Uncle    Colon polyps Paternal Aunt    Pancreatic cancer Paternal Aunt    Rectal cancer Paternal Aunt    Colon cancer Paternal Aunt    Colon cancer Paternal Grandmother    Breast cancer Maternal Grandmother    Stomach cancer Neg Hx    Esophageal cancer Neg Hx    Liver cancer Neg Hx    Past Surgical History:  Procedure Laterality Date   ANKLE SURGERY     Bilateral    CATARACT EXTRACTION Bilateral    CERVICAL SPINE SURGERY  07/21/2021   LAPAROSCOPIC APPENDECTOMY N/A 05/11/2023   Procedure: APPENDECTOMY LAPAROSCOPIC;  Surgeon: Dorena Gander, MD;  Location: [facility] OR;  Service: General;  Laterality: N/A;   TUBAL LIGATION     Social History   Occupational History   Occupation: [employer]  Tobacco Use   Smoking status: Former    Current packs/day: 0.00    Types: Cigarettes    Start date: 10/16/1966  Quit date: 10/15/1996    Years since quitting: 26.6   Smokeless tobacco: Never  Vaping Use   Vaping status: Never Used  Substance and Sexual Activity   Alcohol use: No   Drug use: No   Sexual activity: Yes

## 2023-05-31 ENCOUNTER — Ambulatory Visit: Payer: Medicare HMO | Admitting: Nurse Practitioner

## 2023-06-02 ENCOUNTER — Ambulatory Visit: Payer: Medicare HMO | Admitting: Sports Medicine

## 2023-06-02 ENCOUNTER — Encounter: Payer: Self-pay | Admitting: Sports Medicine

## 2023-06-02 DIAGNOSIS — M7632 Iliotibial band syndrome, left leg: Secondary | ICD-10-CM | POA: Diagnosis not present

## 2023-06-02 DIAGNOSIS — M6281 Muscle weakness (generalized): Secondary | ICD-10-CM | POA: Diagnosis not present

## 2023-06-02 IMAGING — MR MR FOOT*L* W/O CM
4 of 5 series · 21 of 40 positions shown · non-contrast
Comparison: None.

CLINICAL DATA: Left medial foot pain.

EXAM:
MRI OF THE LEFT FOOT WITHOUT CONTRAST
TECHNIQUE: Multiplanar, multisequence MR imaging of the left foot was
performed. No intravenous contrast was administered.

[Series 5: T2 fat-sat · coronal · 3.0mm · 0.19mm/px · 9 of 49 slices shown (1 of 2)]
[im 1/49]
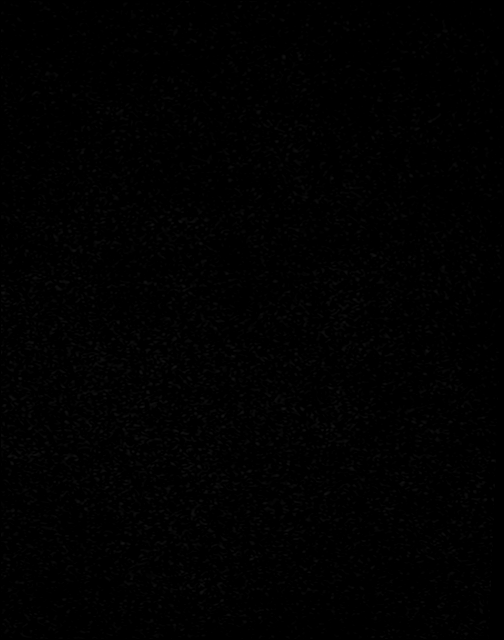
[im 9/49]
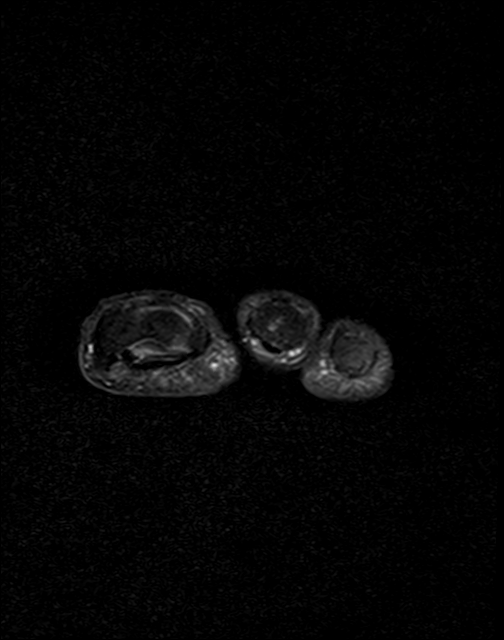
[im 14/49]
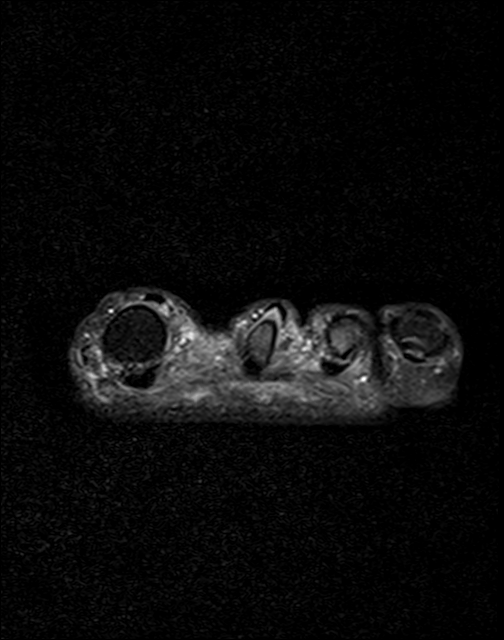
[im 22/49]
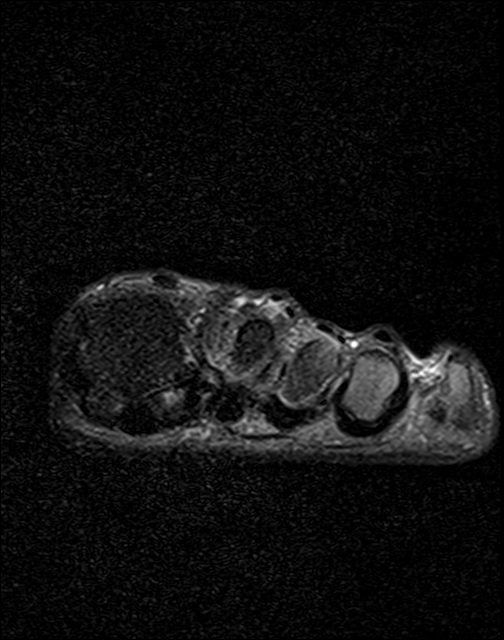
[im 27/49]
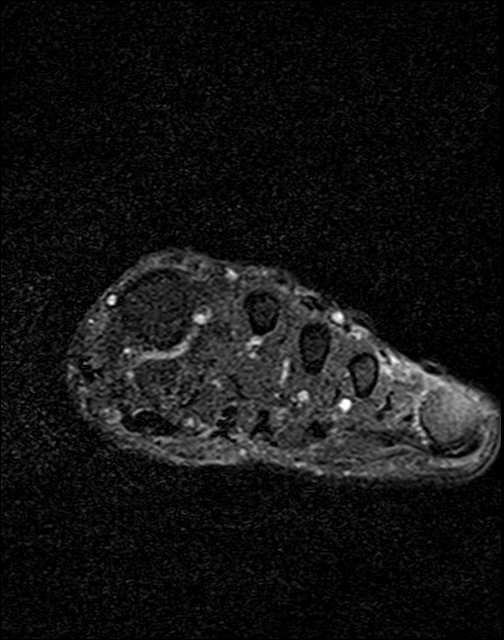
[im 35/49]
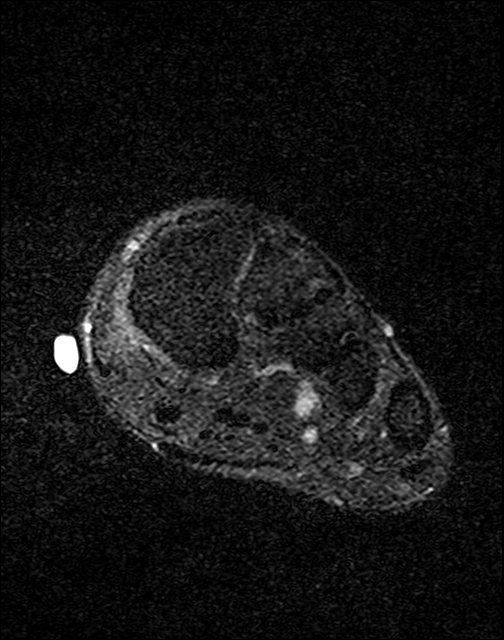
[im 40/49]
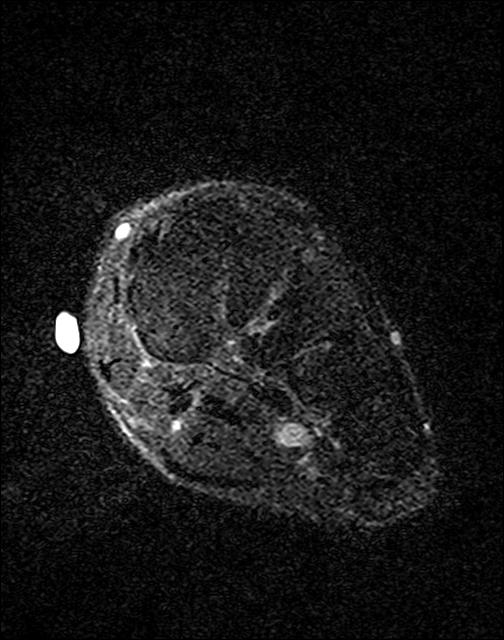
[im 44/49]
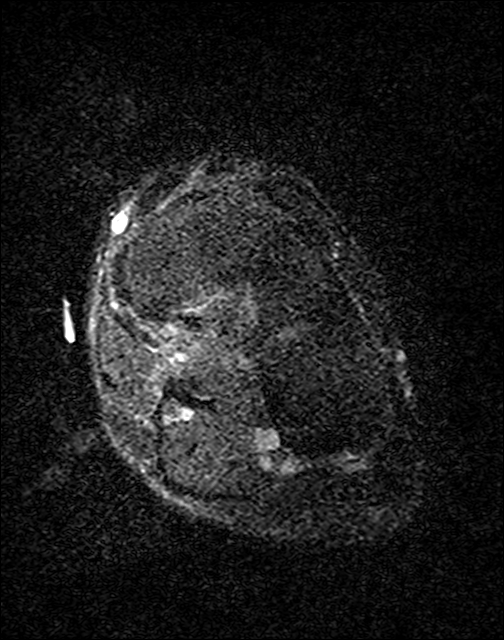
[im 49/49]
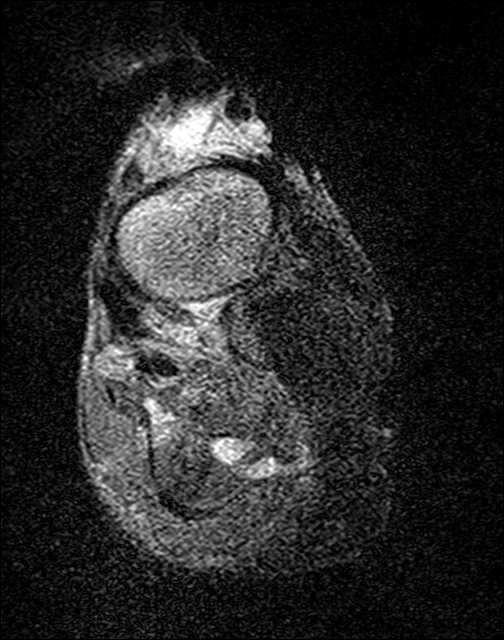

[Series 6: T2 fat-sat · axial · 3.0mm · 0.37mm/px · z∈[-104,-24]mm · 5 of 22 slices shown (2 of 2)]
[im 1/22]
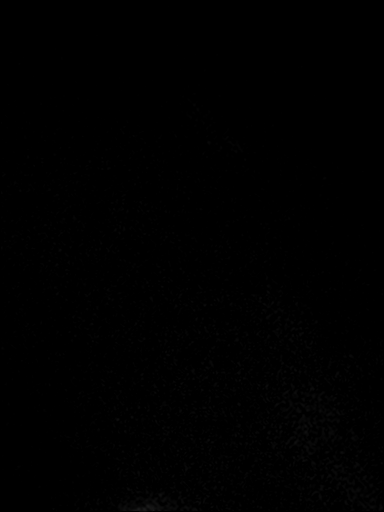
[im 6/22]
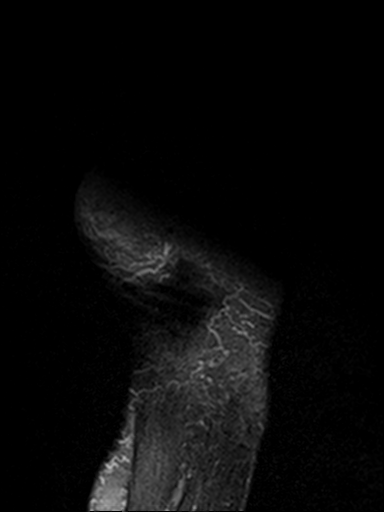
[im 11/22]
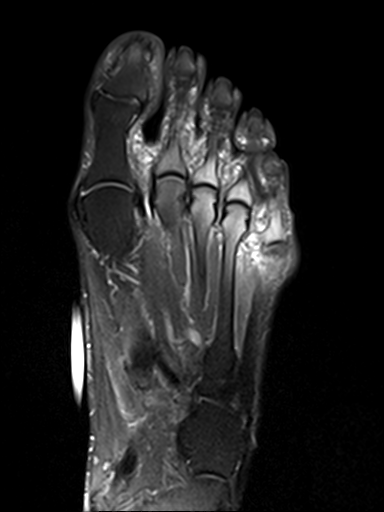
[im 16/22]
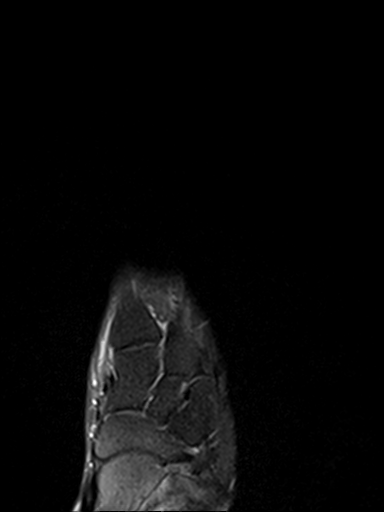
[im 22/22]
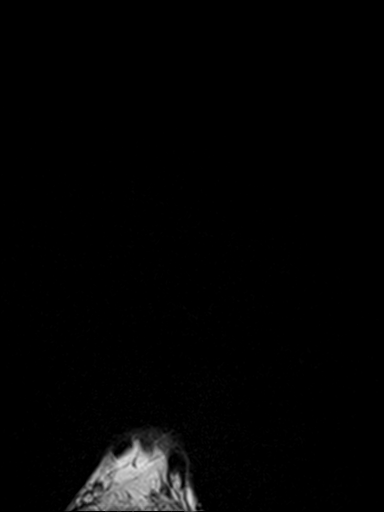

[Series 7: T1 · axial · 3.0mm · 0.37mm/px · z∈[-104,-24]mm · 4 of 22 slices shown (1 of 2)]
[im 1/22]
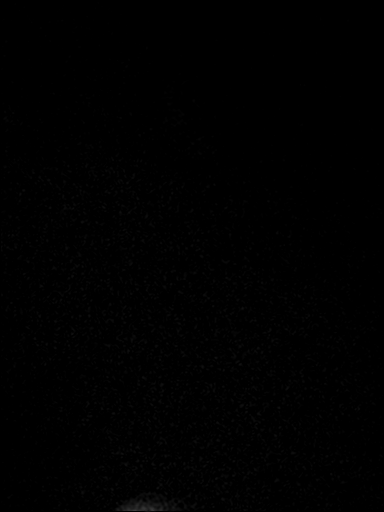
[im 6/22]
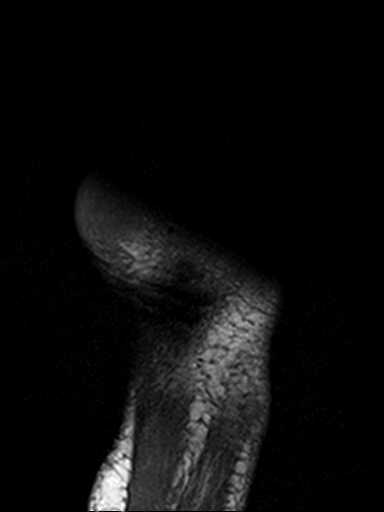
[im 11/22]
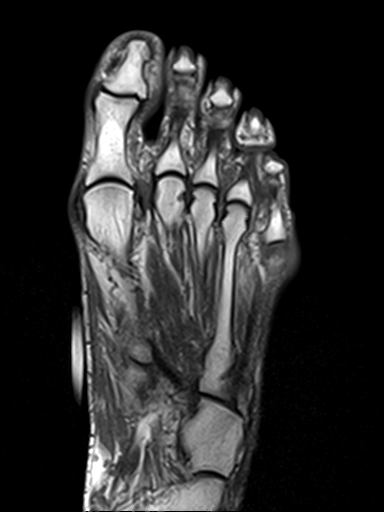
[im 22/22]
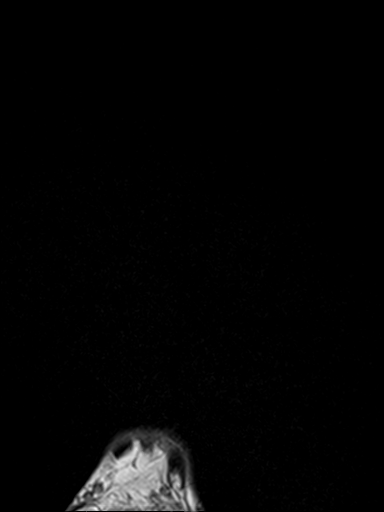

[Series 9: T1 · coronal · 3.0mm · 0.23mm/px · 3 of 49 slices shown (2 of 2)]
[im 9/49]
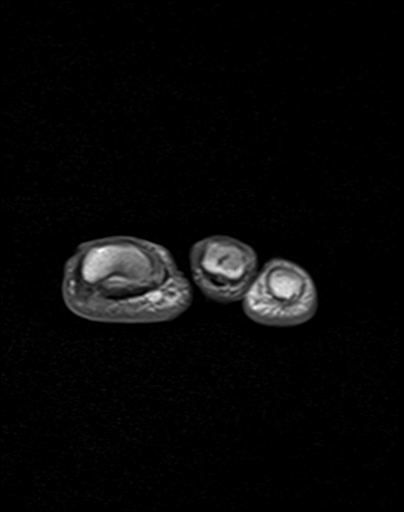
[im 27/49]
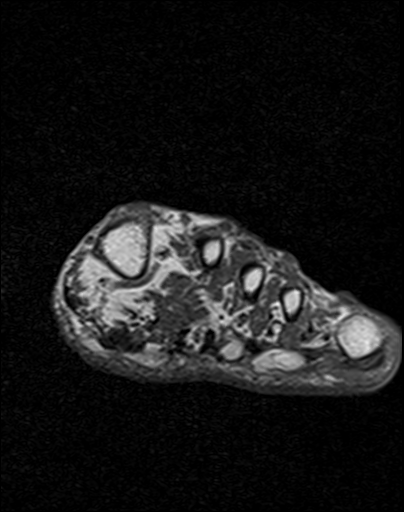
[im 44/49]
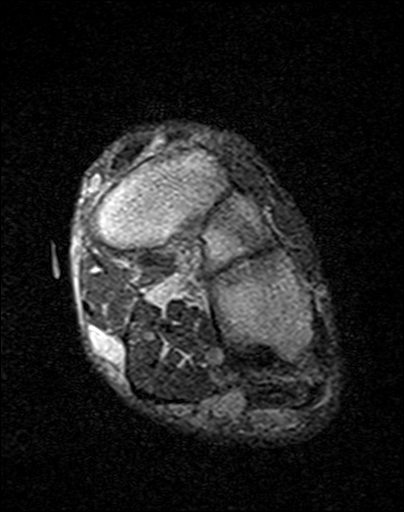

[21 of 40 positions shown; findings below may reference images not displayed]

FINDINGS: Bones/Joint/Cartilage

No fracture or dislocation. Normal alignment. No joint effusion.

Mild arthritic changes of the medial and lateral hallux
sesamoid-metatarsal articulation with subchondral reactive marrow
edema, lateral worse than medial.

Ligaments

Collateral ligaments are intact. Lisfranc ligament is intact.

Muscles and Tendons

Flexor, peroneal and extensor compartment tendons are intact. Mild
edema in the abductor hallucis muscle likely reflecting mild muscle
strain.

Soft tissue
No fluid collection or hematoma. No soft tissue mass.
IMPRESSION: 1. Mild arthritic changes of the medial and lateral hallux
sesamoid-metatarsal articulation with subchondral reactive marrow
edema, lateral worse than medial.
2. Mild edema in the abductor hallucis muscle likely reflecting mild
muscle strain.

## 2023-06-02 NOTE — Progress Notes (Signed)
Patient says that she is overall feeling pretty well. She says that when she sits it almost feels like the bone is pushing on her leg which is very painful. She says though that initially she could not cross one leg over while seated due to pain through the side of the leg but that has improved, or crossing the leg over with a strap to stretch through the IT band, but she could do that now.

## 2023-06-02 NOTE — Progress Notes (Signed)
Office Visit Note   Patient: Donna [name]           Date of Birth: October 21, 1950           MRN: [id number] Visit Date: 06/02/2023              Requested by: Irena Reichmann, DO [address] [place],  [place] [address] PCP: Irena Reichmann, DO  Medical Resident, Sports Medicine Fellow - Attending Physician Addendum:   I have independently interviewed and examined the patient myself. I have discussed the above with the original author and agree with their documentation. My edits for correction/addition/clarification have been made, see any changes above and below.   In summary, Leti has resolved IT band syndrome after 2 treatments of extracorporeal shockwave therapy and rest.  She does have some lingering pain in the quadricep and over the lateral leg, which I think is more from her muscular atrophy and some weakness from her and activity.  We will get her started in formalized physical therapy to work on strengthening the quad as well as her hip abductors.  Would benefit from some IT band stretching as well.  All of this needs to start 2 weeks from today's appointment given her previous surgery.  She may begin on 06/17/2023 and may progressively increase as able.  She does have mild to moderate hip arthritis, but I think this is less of an issue for her.  After about 4-6 weeks of PT, if she is still having any issues, she may present for reevaluation.  Otherwise follow-up as needed.  Madelyn Brunner, DO Primary Care Sports Medicine Physician  [facility] - Orthopedics   Assessment & Plan: Visit Diagnoses:  1. It band syndrome, left   2. Weakness of left quadriceps muscle    Plan: Patient with improved IT band syndrome, likely from rest as well as good improvements previous shockwave therapies.  At this time, will not do another shockwave therapy.  Patient would benefit from IT band stretching as when she is able to 2 weeks as well as some quadricep/gluteus strengthening.  Patient's quadricep pain is less likely coming from her hip based on exam and more likely coming due to muscular deconditioning. Will place order for physical therapy to start approximately 2 weeks from now.  Patient advised that if throughout her physical therapy sessions she does have any flareup of the IT band syndrome, can resume shockwave therapy at that time or further evaluation of the leg/hip.  Follow-Up Instructions: Return if symptoms worsen or fail to improve.    Procedures: N/a   Clinical Data: No additional findings.   Subjective: Chief Complaint  Patient presents with   Left Hip - Follow-up    Patient is presenting for follow-up of left-sided ITP and syndrome.  Patient recently underwent appendectomy laparoscopically, patient has been advised to not do any activity other than walking for another 2 weeks.  Patient is not been able to do her IT band stretches or exercises because of that.  Patient notes that she is not having as much IT band syndrome pain whenever she is doing any sort of stretches or walking.  Patient does note that when she sits for long periods of time she may get some quadricep pain as well as some gluteus pain but that usually goes away after period of time.  Patient otherwise has no other concerns at this time.  Patient does plan on getting active again in 2 weeks when she  is released by her surgeon    Review of Systems   Objective:  Physical Exam  Ortho Exam  Inspection of left hip reveals no gross abnormalities. Inspection of the left quadricep that shows some atrophy when compared to the right. There is some tenderness to palpation of the the IT band as it crosses over the distal femur.  No tenderness at the proximal or distal insertions.  There is some mild tenderness to palpation over the anterior quadricep.  Range of motion is full with flexion extension as well as internal and external rotation.  Strength is 5 out of 5 with hip abduction as  well as hip flexion though some pain noted with hip flexion.  FADIR/FABER: Negative Specialty Comments:  No specialty comments available.  Imaging: No results found.   PMFS History: Patient Active Problem List   Diagnosis Date Noted   S/P appendectomy 05/11/2023   S/P laparoscopic appendectomy 05/11/2023   Gasping for breath 08/19/2021   PTSD (post-traumatic stress disorder) 08/19/2021   Parasomnia overlap disorder 08/19/2021   Snoring 08/19/2021   Excessive daytime sleepiness 08/19/2021   Sleep paralysis, recurrent isolated 08/19/2021   Asthma 02/19/2021   Multiple drug allergies 02/19/2021   Gluten intolerance 02/19/2021   Chronic rhinitis 02/19/2021   Metatarsalgia of left foot 11/14/2020   Achilles tendinosis of right lower extremity 11/14/2020   Essential tremor 07/04/2019   DOE (dyspnea on exertion) 01/20/2019   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 06/13/2018   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   Superficial basal cell carcinoma 06/13/2018   Upper airway cough syndrome 06/13/2018   Atrophic vaginitis 02/17/2018   Bilateral leg cramps 02/17/2018   Fatty liver, alcoholic 04/22/2016   Depression 04/19/2016   Withdrawal complaint 04/19/2016   Hx of adenomatous colonic polyps 08/03/2015   Herpes simplex 06/15/2014   GERD (gastroesophageal reflux disease) 10/14/2013   Anxiety state 10/14/2013   Recovering alcoholic in remission (HCC) 10/14/2013   Allergic rhinitis 10/14/2013   Adjustment disorder with anxiety 10/14/2013   Elevated low-density lipoprotein level 10/14/2013   Essential hypertension 10/15/2012   Heart murmur 05/25/2012   Past Medical History:  Diagnosis Date   Anxiety    Asthma    Basal cell carcinoma 06/29/2017   right lip - CX3 + 5FU   BCC (basal cell carcinoma of skin) 10/29/2022   left lower leg, posterior - Mohs 01/27/2023   Cataract    bilateral lens implants   Cough variant asthma     Depression    GERD (gastroesophageal reflux disease)    Heart murmur    Hiatal hernia    Hyperlipidemia    IBS (irritable bowel syndrome)    SCC (squamous cell carcinoma) 10/29/2022   right lower leg, anterior - Mohs 12/28/2022 Dr Daphine Deutscher   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   chest - CX3 + 5FU   Squamous cell carcinoma of skin 09/27/2014   left thigh - tx p bx   Substance abuse (HCC)     Family History  Problem Relation Age of Onset   Dementia Mother    Diabetes Mother    Melanoma Father    Colon cancer Father 73       Survived   Heart disease Father    Heart attack Father    Rheum arthritis Father    Dementia Father    Healthy Sister    Healthy Brother    Dementia Maternal Aunt  Dementia Maternal Uncle    Colon polyps Paternal Aunt    Pancreatic cancer Paternal Aunt    Rectal cancer Paternal Aunt    Colon cancer Paternal Aunt    Colon cancer Paternal Grandmother    Breast cancer Maternal Grandmother    Stomach cancer Neg Hx    Esophageal cancer Neg Hx    Liver cancer Neg Hx     Past Surgical History:  Procedure Laterality Date   ANKLE SURGERY     Bilateral    CATARACT EXTRACTION Bilateral    CERVICAL SPINE SURGERY  07/21/2021   LAPAROSCOPIC APPENDECTOMY N/A 05/11/2023   Procedure: APPENDECTOMY LAPAROSCOPIC;  Surgeon: Violeta Gelinas, MD;  Location: [facility] OR;  Service: General;  Laterality: N/A;   TUBAL LIGATION     Social History   Occupational History   Occupation: [employer]  Tobacco Use   Smoking status: Former    Current packs/day: 0.00    Types: Cigarettes    Start date: 10/16/1966    Quit date: 10/15/1996    Years since quitting: 26.6   Smokeless tobacco: Never  Vaping Use   Vaping status: Never Used  Substance and Sexual Activity   Alcohol use: No   Drug use: No   Sexual activity: Yes

## 2023-06-04 ENCOUNTER — Telehealth: Payer: Self-pay | Admitting: Sports Medicine

## 2023-06-04 ENCOUNTER — Other Ambulatory Visit: Payer: Self-pay | Admitting: Sports Medicine

## 2023-06-04 DIAGNOSIS — M25552 Pain in left hip: Secondary | ICD-10-CM

## 2023-06-04 DIAGNOSIS — M6281 Muscle weakness (generalized): Secondary | ICD-10-CM

## 2023-06-04 DIAGNOSIS — M7632 Iliotibial band syndrome, left leg: Secondary | ICD-10-CM

## 2023-06-04 NOTE — Telephone Encounter (Signed)
Patient called. Would like to know if she will get a PT referral? Her cb# [phone]

## 2023-06-04 NOTE — Telephone Encounter (Signed)
PT referral sent for ITB syndrome and weakness of hip flexors and abductors.

## 2023-06-07 ENCOUNTER — Encounter: Payer: Medicare HMO | Attending: Psychology | Admitting: Psychology

## 2023-06-07 DIAGNOSIS — F32A Depression, unspecified: Secondary | ICD-10-CM | POA: Diagnosis not present

## 2023-06-07 DIAGNOSIS — F419 Anxiety disorder, unspecified: Secondary | ICD-10-CM | POA: Insufficient documentation

## 2023-06-07 DIAGNOSIS — G4719 Other hypersomnia: Secondary | ICD-10-CM | POA: Diagnosis not present

## 2023-06-07 DIAGNOSIS — F4310 Post-traumatic stress disorder, unspecified: Secondary | ICD-10-CM | POA: Insufficient documentation

## 2023-06-07 DIAGNOSIS — R4189 Other symptoms and signs involving cognitive functions and awareness: Secondary | ICD-10-CM | POA: Insufficient documentation

## 2023-06-09 NOTE — Progress Notes (Unsigned)
Cardiology Office Note:  .   Date:  06/10/2023  ID:  Donna [name], DOB 1950-11-07, MRN [id number] PCP: Irena Reichmann, DO  [facility] Providers Cardiologist:  Charlton Haws, MD    Patient Profile: .      PMH Hypertension Coronary artery disease CT Calcium score 05/06/2022 CAC score 50.3 (59th percentile) LM 0, LAD 48.6, LCx 1.68, RCA 0 Coronary CTA 08/07/2022 CAC score 64 (66th percentile) TPV 129 (25th-50th percentile) Minimal non-obstructive CAD (1-24%) Aortic atherosclerosis Mild aortic root dilatation Essential tremor Hyperlipidemia Statin myalgia Former alcohol abuse Sober since 2017 Former tobacco abuse  Initially seen by Dr. Eden Emms in 2014 for atypical chest pain.  She had normal ECG and normal nuclear study with no infarct or ischemia, EF 57%.  She has a history of EtOH abuse, sober since 2017, treated with Inderal for withdrawal.  History of noncompliance with medication.  Seen by neurology 08/2018 for essential tremor to r/o Parkinson's.  There is a family history of tremor, mostly affects left arm.  ABIs 11/15/2018 no signs of PVD.  Myoview 08/31/2019, normal, EF 59%.  Echo 09/11/2020 with EF 55 to 60%, mild MR, mild AR, trileaflet valve, aortic root dilatation 4.0 cm.  She sees Dr. Vickey Huger for sleep issues.  CT calcium score 05/06/2022 with CAC score of 50.3, 59th percentile.  Dr. Eden Emms advised her to work on LDL goal < 70.  She was seen by me on 07/24/2022 for increased shortness of breath walking around her neighborhood.  She also had some episodes of midsternal chest pain that concerned her.  Also noted pitting edema in her right ankle. Generally walks 15 to 30 minutes daily with hills, noting it more difficult in the afternoon than in the mornings. She admitted to needing to cut back on adding sea salt to her food.  History of asthma and working with pulmonology on medication management.  Due to her symptoms, we pursued coronary CTA which revealed nonobstructive  coronary disease.  She was advised to start rosuvastatin 20 mg daily and return for repeat lipid testing in 2 to 3 months.  Unfortunately, she had myalgia with rosuvastatin and she was switched to pravastatin.       History of Present Illness: .   Donna [name] is a very pleasant 73 y.o. female who is here today for follow-up of hyperlipidemia and aortic dilatation. She reports muscle aches with multiple cholesterol medications including rosuvastatin, pravastatin, and red yeast rice. She unfortunately recently underwent an emergency appendectomy and is recovering well, planning to resume exercise next week. She has been walking but looks forward to returning to yoga. She has been under significant stress due to family health issues and has been unable to exercise for a month due to the appendectomy.  History of tremor managed with propranolol, but notes that the medication sometimes exacerbates her asthma. She has occasional shortness of breath which she attributes to asthma.  Occasional palpitations, particularly during times of stress.  She occasionally takes propranolol but feels like the effectiveness is so quick that it exacerbates her asthma.  She reports occasional LE edema which is stable. She denies chest pain, orthopnea, PND, presyncope, syncope.  Discussed the use of AI scribe software for clinical note transcription with the patient, who gave verbal consent to proceed.   ROS: See HPI       Studies Reviewed: Marland Kitchen   EKG Interpretation Date/Time:  Thursday June 10 2023 08:16:23 EST Ventricular Rate:  60 PR Interval:  150 QRS  Duration:  82 QT Interval:  402 QTC Calculation: 402 R Axis:   78  Text Interpretation: Normal sinus rhythm Normal ECG No ST abnormality Confirmed by Eligha Bridegroom [phone]) on 06/10/2023 8:23:43 AM    Risk Assessment/Calculations:             Physical Exam:   VS:  BP 138/68   Pulse (!) 58   Ht 5\' 7"  (1.702 m)   Wt 155 lb 3.2 oz (70.4 kg)   SpO2 98%    BMI 24.31 kg/m    Wt Readings from Last 3 Encounters:  06/10/23 155 lb 3.2 oz (70.4 kg)  05/11/23 155 lb (70.3 kg)  03/29/23 150 lb (68 kg)    GEN: Well nourished, well developed in no acute distress NECK: No JVD; No carotid bruits CARDIAC: RRR, no murmurs, rubs, gallops RESPIRATORY:  Clear to auscultation without rales, wheezing or rhonchi  ABDOMEN: Soft, non-tender, non-distended EXTREMITIES:  No edema; No deformity     ASSESSMENT AND PLAN: .    CAD without angina/aortic atherosclerosis: Coronary CTA 08/06/2022 with CAC score of 64 (66 percentile), total plaque volume 129, minimal (1-24%) nonobstructive CAD, aortic atherosclerosis. She denies chest pain, dyspnea, or other symptoms concerning for angina. EKG reveals NSR with no ST abnormality.  No indication for further ischemic evaluation at this time. She has occasional shortness of breath she feels is secondary to asthma and occasional palpitation she feels are secondary to stress. She has tried as needed propranolol for palpitations but it makes her feel more short of breath. We will trial metoprolol tartrate 12.5 mg as needed for palpitations as noted below.  Aim for LDL 70 or lower.  We will retest lipids and 2 months.  Palpitations: Reports occasional palpitations she feels are stress/anxiety induced.  Has tried propranolol which she takes for tremor but this makes her feel more short of breath due to its quick action.  She would like to try low-dose metoprolol. Will give her Rx for metoprolol tartrate 12.5 mg every 12 hours as needed.   Aortic dilatation: We reviewed TTE 10/08/21 which revealed mild ascending aortic dilatation at 40 mm. Coronary CTA 08/06/22 revealed aortic dimension 38 mm, no dissection. We will repeat imaging March 2025 for surveillance. Maintain good BP control.   Hyperlipidemia LDL goal < 70: Lipid panel completed 10/12/2022 with total cholesterol 218, triglycerides 38, HDL 83, and LDL 128.  We had a lengthy discussion  about reason for goal LDL 70 or lower. She has tried multiple lipid-lowering therapies including red yeast rice, rosuvastatin, and pravastatin and unfortunately had myalgias on each of these.  We discussed novel lipid-lowering therapies and she will consider these. Due to recent appendectomy, diet and exercise routine have been abnormal for the past 1 month.  Advised that in 2 months we will get repeat lipid panel and lipoprotein a for further risk stratification.  She is agreeable to consider Nexletol or PCSK9 inhibitor if LDL remains elevated.  Essential tremor: Takes propranolol as needed for tremor, usually when speaking in front of groups. As noted, we will add lopressor as needed for palpitations.   Elevated BP reading: SBP 138 mmHg in office, a little higher on my recheck. She reports well controlled BP at home and with PCP.  She takes lisinopril only when traveling to higher elevation due to an increase in BP during those trips. It generally takes about 1 week for BP to normalize. Renal function stable on labs completed 05/12/2023. Continue prn lisinopril.  Disposition:1 year with Dr. Eden Emms  Signed, Eligha Bridegroom, NP-C

## 2023-06-10 ENCOUNTER — Other Ambulatory Visit: Payer: Self-pay | Admitting: *Deleted

## 2023-06-10 ENCOUNTER — Encounter: Payer: Self-pay | Admitting: Nurse Practitioner

## 2023-06-10 ENCOUNTER — Ambulatory Visit: Payer: Medicare HMO | Attending: Nurse Practitioner | Admitting: Nurse Practitioner

## 2023-06-10 VITALS — BP 138/68 | HR 58 | Ht 67.0 in | Wt 155.2 lb

## 2023-06-10 DIAGNOSIS — R030 Elevated blood-pressure reading, without diagnosis of hypertension: Secondary | ICD-10-CM

## 2023-06-10 DIAGNOSIS — I77819 Aortic ectasia, unspecified site: Secondary | ICD-10-CM

## 2023-06-10 DIAGNOSIS — E785 Hyperlipidemia, unspecified: Secondary | ICD-10-CM

## 2023-06-10 DIAGNOSIS — I25118 Atherosclerotic heart disease of native coronary artery with other forms of angina pectoris: Secondary | ICD-10-CM | POA: Diagnosis not present

## 2023-06-10 DIAGNOSIS — R002 Palpitations: Secondary | ICD-10-CM

## 2023-06-10 DIAGNOSIS — G250 Essential tremor: Secondary | ICD-10-CM | POA: Diagnosis not present

## 2023-06-10 MED ORDER — METOPROLOL TARTRATE 25 MG PO TABS: 12.5000 mg | ORAL_TABLET | Freq: Two times a day (BID) | ORAL | 6 refills | Status: DC | PRN

## 2023-06-10 NOTE — Patient Instructions (Signed)
Medication Instructions:   START Lopressor one half (0.5) tablet by mouth ( 12.5 mg ) every 12 hours as needed for palpitations or increase Heart Rate >100 or more.   *If you need a refill on your cardiac medications before your next appointment, please call your pharmacy*   Lab Work:  Your physician recommends that you return for a FASTING NMR/CMET/LPA, fasting after midnight. At any lab corp listed below. Patient was given paper work today.        If you have labs (blood work) drawn today and your tests are completely normal, you will receive your results only by: MyChart Message (if you have MyChart) OR A paper copy in the mail If you have any lab test that is abnormal or we need to change your treatment, we will call you to review the results.   Testing/Procedures:  Non-Cardiac CT Angiography (CTA), is a special type of CT scan that uses a computer to produce multi-dimensional views of major blood vessels throughout the body. In CT angiography, a contrast material is injected through an IV to help visualize the blood vessels. In march for Aortic dilatation.     Follow-Up: At [facility], you and your health needs are our priority.  As part of our continuing mission to provide you with exceptional heart care, we have created designated Provider Care Teams.  These Care Teams include your primary Cardiologist (physician) and Advanced Practice Providers (APPs -  Physician Assistants and Nurse Practitioners) who all work together to provide you with the care you need, when you need it.  We recommend signing up for the patient portal called "MyChart".  Sign up information is provided on this After Visit Summary.  MyChart is used to connect with patients for Virtual Visits (Telemedicine).  Patients are able to view lab/test results, encounter notes, upcoming appointments, etc.  Non-urgent messages can be sent to your provider as well.   To learn more about what you can do with  MyChart, go to [email or web address].    Your next appointment:   1 year(s)  Provider:   Charlton Haws, MD     Other Instructions  Your physician wants you to follow-up in: 1 year.  You will receive a reminder letter in the mail two months in advance. If you don't receive a letter, please call our office to schedule the follow-up appointment.    1st Floor: - Lobby - Registration  - Pharmacy  - Lab - Cafe  2nd Floor: - PV Lab - Diagnostic Testing (echo, CT, nuclear med)  3rd Floor: - Vacant  4th Floor: - TCTS (cardiothoracic surgery) - AFib Clinic - Structural Heart Clinic - Vascular Surgery  - Vascular Ultrasound  5th Floor: - HeartCare Cardiology (general and EP) - Clinical Pharmacy for coumadin, hypertension, lipid, weight-loss medications, and med management appointments    Valet parking services will be available as well.

## 2023-06-11 ENCOUNTER — Ambulatory Visit: Payer: Medicare HMO | Admitting: Sports Medicine

## 2023-06-14 ENCOUNTER — Ambulatory Visit: Payer: Medicare HMO | Admitting: Sports Medicine

## 2023-06-16 ENCOUNTER — Encounter: Payer: Medicare HMO | Attending: Sports Medicine

## 2023-06-16 DIAGNOSIS — R413 Other amnesia: Secondary | ICD-10-CM | POA: Diagnosis not present

## 2023-06-16 DIAGNOSIS — F4310 Post-traumatic stress disorder, unspecified: Secondary | ICD-10-CM | POA: Insufficient documentation

## 2023-06-16 DIAGNOSIS — F1914 Other psychoactive substance abuse with psychoactive substance-induced mood disorder: Secondary | ICD-10-CM | POA: Diagnosis not present

## 2023-06-16 DIAGNOSIS — F418 Other specified anxiety disorders: Secondary | ICD-10-CM | POA: Diagnosis not present

## 2023-06-16 DIAGNOSIS — G4719 Other hypersomnia: Secondary | ICD-10-CM | POA: Insufficient documentation

## 2023-06-16 NOTE — Progress Notes (Signed)
 Behavioral Observations:  The patient appeared well-groomed and appropriately dressed. Her manners were polite and appropriate to the situation. The patient's attitude towards testing was positive and her effort was very good.  Neuropsychology Note  Donna [name] completed 120 minutes of neuropsychological testing with technician, Josue Ned, BA, under the supervision of Norleen Asa, PsyD., Clinical Neuropsychologist. The patient did not appear overtly distressed by the testing session, per behavioral observation or via self-report to the technician. Rest breaks were offered.   Clinical Decision Making: In considering the patient's current level of functioning, level of presumed impairment, nature of symptoms, emotional and behavioral responses during clinical interview, level of literacy, and observed level of motivation/effort, a battery of tests was selected by Dr. Asa during initial consultation on 06/07/2023. This was communicated to the technician. Communication between the neuropsychologist and technician was ongoing throughout the testing session and changes were made as deemed necessary based on patient performance on testing, technician observations and additional pertinent factors such as those listed above.  Tests Administered: Controlled Oral Word Association Test (COWAT; FAS & Animals)  Wechsler Adult Intelligence Scale, 4th Edition (WAIS-IV) Wechsler Memory Scale, 4th Edition (WMS-IV); Older Adult Battery    Results:  COWAT: FAS total= 45 Z= 0.80 Animals total= 22 Z= 1.50  WAIS-IV:  Composite Score Summary  Scale Sum of Scaled Scores Composite Score Percentile Rank 95% Conf. Interval Qualitative Description  Verbal Comprehension 31 VCI 102 55 96-108 Average  Perceptual Reasoning 35 PRI 109 73 102-115 Average  Working Memory 16 WMI 89 23 83-96 Low Average  Processing Speed 24 PSI 111 77 102-118 High Average  Full Scale 106 FSIQ 104 61 100-108  Average  General Ability 66 GAI 105 63 100-110 Average   Verbal Comprehension Subtests Summary  Subtest Raw Score Scaled Score Percentile Rank Reference Group Scaled Score SEM  Similarities 22 9 37 9 0.95  Vocabulary 43 12 75 12 0.67  Information 15 10 50 11 0.73  The scaled scores in the Reference Group Scaled Score column are based on the performance of examinees aged 20:0-34:11 (i.e., the reference group). See Chapter 6 of the WAIS-IV Technical and Interpretive Manual for more information.  Perceptual Reasoning Subtests Summary  Subtest Raw Score Scaled Score Percentile Rank Reference Group Scaled Score SEM  Block Design 38 12 75 8 0.99  Matrix Reasoning 17 13 84 9 0.90  Visual Puzzles 11 10 50 7 0.99   Working Librarian, Academic Raw Score Scaled Score Percentile Rank Reference Group Scaled Score SEM  Digit Span 23 9 37 7 0.73  Arithmetic 10 7 16 7  1.20   Processing Speed Subtests Summary  Subtest Raw Score Scaled Score Percentile Rank Reference Group Scaled Score SEM  Symbol Search 28 12 75 8 1.12  Coding 59 12 75 8 1.12     WMS-IV:  Index Score Summary  Index Sum of Scaled Scores Index Score Percentile Rank 95% Confidence Interval Qualitative Descriptor  Auditory Memory (AMI) 40 100 50 94-106 Average  Visual Memory (VMI) 23 108 70 103-113 Average  Immediate Memory (IMI) 29 97 42 91-103 Average  Delayed Memory (DMI) 34 108 70 100-115 Average    Primary Subtest Scaled Score Summary  Subtest Domain Raw Score Scaled Score Percentile Rank  Logical Memory I AM 32 10 50  Logical Memory II AM 24 12 75  Verbal Paired Associates I AM 14 8 25   Verbal Paired Associates II AM 6 10 50  Visual Reproduction I  VM 33 11 63  Visual Reproduction II VM 24 12 75  Symbol Span VWM 12 8 25     Auditory Memory Process Score Summary  Process Score Raw Score Scaled Score Percentile Rank Cumulative Percentage (Base Rate)  LM II Recognition 17 - - 26-50%  VPA II Recognition  28 - - 51-75%   Visual Memory Process Score Summary  Process Score Raw Score Scaled Score Percentile Rank Cumulative Percentage (Base Rate)  VR II Recognition 5 - - 51-75%    ABILITY-MEMORY ANALYSIS  Ability Score:  VCI: 102 Date of Testing:  WAIS-IV; WMS-IV 2023/06/16  Predicted Difference Method   Index Predicted WMS-IV Index Score Actual WMS-IV Index Score Difference Critical Value  Significant Difference Y/N Base Rate  Auditory Memory 101 100 1 10.41 N   Visual Memory 101 108 -7 7.35 N   Immediate Memory 101 97 4 9.69 N   Delayed Memory 101 108 -7 12.22 N   Statistical significance (critical value) at the .01 level.     Feedback to Patient: [name] will return on 02/08/2024 for an interactive feedback session with Dr. Corina at which time her test performances, clinical impressions and treatment recommendations will be reviewed in detail. The patient understands she can contact our office should she require our assistance before this time.  120 minutes spent face-to-face with patient administering standardized tests, 30 minutes spent scoring radiographer, therapeutic). [CPT [id number], [id number]]  Full report to follow.

## 2023-06-17 DIAGNOSIS — Z205 Contact with and (suspected) exposure to viral hepatitis: Secondary | ICD-10-CM | POA: Diagnosis not present

## 2023-06-21 DIAGNOSIS — Z205 Contact with and (suspected) exposure to viral hepatitis: Secondary | ICD-10-CM | POA: Diagnosis not present

## 2023-06-22 ENCOUNTER — Ambulatory Visit: Payer: Medicare HMO | Admitting: Rehabilitative and Restorative Service Providers"

## 2023-06-22 ENCOUNTER — Encounter: Payer: Self-pay | Admitting: Rehabilitative and Restorative Service Providers"

## 2023-06-22 DIAGNOSIS — M6281 Muscle weakness (generalized): Secondary | ICD-10-CM

## 2023-06-22 DIAGNOSIS — M25552 Pain in left hip: Secondary | ICD-10-CM

## 2023-06-22 DIAGNOSIS — M79605 Pain in left leg: Secondary | ICD-10-CM

## 2023-06-22 NOTE — Therapy (Signed)
OUTPATIENT PHYSICAL THERAPY  EVALUATION   Patient Name: Donna [name] MRN: [id number] DOB:12/05/1950, 73 y.o., female Today's Date: 06/22/2023  END OF SESSION:  PT End of Session - 06/22/23 1301     Visit Number 1    Number of Visits 20    Date for PT Re-Evaluation 08/31/23    Authorization Type [employer] $10 copay    PT Start Time 1310    PT Stop Time 1400    PT Time Calculation (min) 50 min    Activity Tolerance Patient tolerated treatment well    Behavior During Therapy [facility] for tasks assessed/performed             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   chest - CX3 + 5FU   Squamous cell carcinoma of skin 09/27/2014   left thigh - tx p bx   Substance abuse (HCC)    Past Surgical History:  Procedure Laterality Date   ANKLE SURGERY     Bilateral    CATARACT EXTRACTION Bilateral    CERVICAL SPINE SURGERY  07/21/2021   LAPAROSCOPIC APPENDECTOMY N/A 05/11/2023   Procedure: APPENDECTOMY LAPAROSCOPIC;  Surgeon: Violeta Gelinas, MD;  Location: [facility] OR;  Service: General;  Laterality: N/A;   TUBAL LIGATION     Patient Active Problem List   Diagnosis Date Noted   S/P appendectomy 05/11/2023   S/P laparoscopic appendectomy 05/11/2023   Gasping for breath 08/19/2021   PTSD (post-traumatic stress disorder) 08/19/2021   Parasomnia overlap disorder 08/19/2021   Snoring 08/19/2021   Excessive daytime sleepiness 08/19/2021    Sleep paralysis, recurrent isolated 08/19/2021   Asthma 02/19/2021   Multiple drug allergies 02/19/2021   Gluten intolerance 02/19/2021   Chronic rhinitis 02/19/2021   Metatarsalgia of left foot 11/14/2020   Achilles tendinosis of right lower extremity 11/14/2020   Essential tremor 07/04/2019   DOE (dyspnea on exertion) 01/20/2019   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 06/13/2018   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   Superficial basal cell carcinoma 06/13/2018   Upper airway cough syndrome 06/13/2018   Atrophic vaginitis 02/17/2018   Bilateral leg cramps 02/17/2018   Fatty liver, alcoholic 04/22/2016   Depression 04/19/2016   Withdrawal complaint 04/19/2016   Hx of adenomatous colonic polyps 08/03/2015   Herpes simplex 06/15/2014   GERD (gastroesophageal reflux disease) 10/14/2013   Anxiety state 10/14/2013   Recovering alcoholic in remission (HCC) 10/14/2013   Allergic rhinitis 10/14/2013   Adjustment disorder with anxiety 10/14/2013   Elevated low-density lipoprotein level 10/14/2013   Essential hypertension 10/15/2012   Heart murmur 05/25/2012    PCP: Irena Reichmann DO  REFERRING PROVIDER: Madelyn Brunner, DO  REFERRING DIAG: [phone] (ICD-10-CM) - It band syndrome, left M25.552 (ICD-10-CM) - Pain of left hip M62.81 (ICD-10-CM) - Weakness of  left quadriceps muscle  THERAPY DIAG:  Pain of left hip  Pain in left leg  Muscle weakness (generalized)  Rationale for Evaluation and Treatment: Rehabilitation  ONSET DATE: November 19, 2022  SUBJECTIVE:   SUBJECTIVE STATEMENT: Pt indicated Dec 31st having emergency appendectomy that took her off exercise for last 6 weeks. Pt indicated chronic history of complaints related to Lt hip/thigh anterior/laterally.  Pt indicated pain was severe at times with difficulty c movement and cross legs, impacted sleeping on either side.  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  Pt  indicated sitting prolonged (30 mins) with complaints in anterior thigh and noted when trying to stand.  Thought rest over last 6 weeks was helpful.  Pt indicated initial complaints were in July and felt the pain but unsure what did. Pt indicated no numbness/tingling.   Had trouble with leg press in past.   Pt indicated seeing PT prior to appendectomy.   PERTINENT HISTORY: Med Hx: Anxiety, history of carcinoma, asthma, Depression, GERD, Hyperlipidemia, IBS, substance abuse per chart review.   Pt reported some cognitive decline by per patient.   PAIN:  NPRS scale: at worst 10/10, current 0/10 Pain location: Lt hip/thigh Pain description:  Aggravating factors: sitting prolonged, mobility, squatting, SLS Relieving factors: walking, rest, shockwave therapy (maybe 2x)  PRECAUTIONS: None  WEIGHT BEARING RESTRICTIONS: No  FALLS:  Has patient fallen in last 6 months? No  LIVING ENVIRONMENT: Lives in: House/apartment Stairs: not at home   OCCUPATION: Yoga instructor  PLOF: Independent, walking, hiking, yoga, biking.  Has gym  PATIENT GOALS: Reduce pain, get back to activity, get stronger.    OBJECTIVE:   PATIENT SURVEYS:  06/22/2023 FOTO intake: 47   predicted:  57  COGNITION: 06/22/2023 Overall cognitive status: WFL    SENSATION: 06/22/2023 [facility]  MUSCLE LENGTH: 06/22/2023 Passive SLR bilateral > 90 deg without pain complaints.   POSTURE:  06/22/2023 No Significant postural limitations  PALPATION: 06/22/2023 Trigger points and tenderness noted in Lt glute med/min, max, Lt lateral quad near IT band  LOWER EXTREMITY ROM:   ROM Right 06/22/2023 Left 06/22/2023  Hip flexion [facility]  Hip extension    Hip abduction    Hip adduction    Hip internal rotation 48 32  Hip external rotation 60 50  Knee flexion    Knee extension    Ankle dorsiflexion    Ankle plantarflexion    Ankle inversion    Ankle eversion     (Blank rows = not tested)  LOWER EXTREMITY  MMT:  MMT Right 06/22/2023 Left 06/22/2023  Hip flexion 5/5 4/5  Hip extension 4/5 4/5  Hip abduction 4/5 3+/5  Hip adduction    Hip internal rotation    Hip external rotation    Knee flexion 5/5 5/5  Knee extension 5/5 5/5  Ankle dorsiflexion 5/5 5/5  Ankle plantarflexion    Ankle inversion    Ankle eversion     (Blank rows = not tested)  LOWER EXTREMITY SPECIAL TESTS:  06/22/2023 No specific testing  FUNCTIONAL TESTS:  06/22/2023 18 inch chair transfer: able to perform without UE but weight shift to Rt leg was noted.  Lt SLS: < 5 seconds with trendelenberg Rt SLS: < 10 seconds with trendelenberg  GAIT: 06/22/2023 Independent ambulation in community.  TODAY'S TREATMENT                                                                          DATE: 06/22/2023 Therex:    HEP instruction/performance c cues for techniques, handout provided.  Trial set performed of each for comprehension and symptom assessment.  See below for exercise list.  Additional time spent in general review of previous exercise (bike, gym, etc).  Nustep lvl 5 8 mins UE/LE for ROM  Neuro Re-ed Education given on pain monitoring scale for exercise return modification based off symptoms.  Chart given.  Pain neuro science education given regarding neural sensitivity due to chronicity of symptoms.  Detailed myofascial pain component to presentation.   Manual Percussive device to Lt glute med/min, lateral aspect of Lt quad.   Education during on possible home use to affected areas.   PATIENT EDUCATION:  06/22/2023 Education details: HEP, POC Person educated: Patient Education method: Programmer, multimedia, Demonstration, Verbal cues, and Handouts Education comprehension: verbalized understanding, returned demonstration, and verbal cues required  HOME  EXERCISE PROGRAM: Access Code: [email or web address] URL: [email or web address] Date: 06/22/2023 Prepared by: Chyrel Masson  Exercises - Supine Figure 4 Piriformis Stretch  - 2-3 x daily - 7 x weekly - 1 sets - 3-5 reps - 15 hold - Supine Piriformis Stretch with Foot on Ground (Mirrored)  - 2-3 x daily - 7 x weekly - 1 sets - 3-5 reps - 15 hold - Supine Bridge with Resistance Band  - 1-2 x daily - 7 x weekly - 2-3 sets - 10 reps - Clamshell  - 1-2 x daily - 7 x weekly - 2-3 sets - 10 reps - Sit to Stand  - 3 x daily - 7 x weekly - 1 sets - 10 reps  ASSESSMENT:  CLINICAL IMPRESSION: Patient is a 73 y.o. who comes to clinic with complaints of Lt hip/leg pain with mobility, strength and movement coordination deficits that impair their ability to perform usual daily and recreational functional activities without increase difficulty/symptoms at this time.  Patient to benefit from skilled PT services to address impairments and limitations to improve to previous level of function without restriction secondary to condition.   OBJECTIVE IMPAIRMENTS: decreased activity tolerance, decreased balance, decreased coordination, decreased endurance, decreased mobility, difficulty walking, decreased ROM, decreased strength, increased fascial restrictions, impaired perceived functional ability, impaired flexibility, improper body mechanics, and pain.   ACTIVITY LIMITATIONS: carrying, lifting, bending, sitting, standing, squatting, sleeping, stairs, transfers, and locomotion level  PARTICIPATION LIMITATIONS: meal prep, cleaning, interpersonal relationship, driving, shopping, community activity, and occupation  PERSONAL FACTORS:  Med Hx: Anxiety, history of carcinoma, asthma, Depgression, GERD, Hyperlipidemia, IBS, substance abuse per chart review.   are also affecting patient's functional outcome.   REHAB POTENTIAL: Good  CLINICAL DECISION MAKING: Stable/uncomplicated  EVALUATION COMPLEXITY:  Low   GOALS: Goals reviewed with patient? Yes  SHORT TERM GOALS: (target date for Short term goals are 3 weeks 07/13/2023)   1.  Patient will demonstrate independent use of home exercise program to maintain progress from in clinic treatments.  Goal status: New  LONG TERM GOALS: (target dates for all long term goals are 10 weeks  08/31/2023 )   1. Patient will demonstrate/report pain at worst  less than or equal to 2/10 to facilitate minimal limitation in daily activity secondary to pain symptoms.  Goal status: New   2. Patient will demonstrate independent use of home exercise program to facilitate ability to maintain/progress functional gains from skilled physical therapy services.  Goal status: New   3. Patient will demonstrate FOTO outcome > or = 57 % to indicate reduced disability due to condition.  Goal status: New   4.  Patient will demonstrate bilateral  LE MMT 5/5 throughout to faciltiate usual transfers, stairs, squatting at [facility] for daily life.   Goal status: New   5.  Patient will demonstrate/report ability to return to YOGA instruction and gym workouts at [facility].  Goal status: New   6.  Patient will demonstrate bilateral SLS > 10 seconds to facilitate stability in ambulation.  Goal status: New    PLAN:  PT FREQUENCY: 1-2x/week  PT DURATION: 10 weeks  PLANNED INTERVENTIONS: Can include 29562- PT Re-evaluation, 97110-Therapeutic exercises, 97530- Therapeutic activity, [id number]- Neuromuscular re-education, 97535- Self Care, 97140- Manual therapy, [phone]- Gait training, [phone]- Orthotic Fit/training, [phone]- Canalith repositioning, [id number]- Aquatic Therapy, 97014- Electrical stimulation (unattended), [id number]- Electrical stimulation (manual), [id number] Physical performance testing, 97016- Vasopneumatic device, [id number]- Ultrasound, [id number]- Traction (mechanical), [id number]- Ionotophoresis 4mg /ml Dexamethasone, Patient/Family education, Balance training, Stair training, Taping, Dry Needling, Joint  mobilization, Joint manipulation, Spinal manipulation, Spinal mobilization, Scar mobilization, Vestibular training, Visual/preceptual remediation/compensation, DME instructions, Cryotherapy, and Moist heat.  All performed as medically necessary.  All included unless contraindicated  PLAN FOR NEXT SESSION: Review HEP knowledge/results.  Progressive hip strengthening/balance improvements.  Incorporation of some gym based activity as tolerated.    Chyrel Masson, PT, DPT, OCS, ATC 06/22/23  2:13 PM

## 2023-06-24 ENCOUNTER — Encounter: Payer: Self-pay | Admitting: Rehabilitative and Restorative Service Providers"

## 2023-06-24 ENCOUNTER — Ambulatory Visit: Payer: Medicare HMO | Admitting: Rehabilitative and Restorative Service Providers"

## 2023-06-24 DIAGNOSIS — M79605 Pain in left leg: Secondary | ICD-10-CM

## 2023-06-24 DIAGNOSIS — M25552 Pain in left hip: Secondary | ICD-10-CM

## 2023-06-24 DIAGNOSIS — M6281 Muscle weakness (generalized): Secondary | ICD-10-CM | POA: Diagnosis not present

## 2023-06-24 NOTE — Therapy (Addendum)
 OUTPATIENT PHYSICAL THERAPY  EVALUATION / DISCHARGE   Patient Name: Donna [name] MRN: [id number] DOB:23-Aug-1950, 73 y.o., female Today's Date: 06/24/2023  END OF SESSION:  PT End of Session - 06/24/23 1333     Visit Number 2    Number of Visits 20    Date for PT Re-Evaluation 08/31/23    Authorization Type [employer] $10 copay    Progress Note Due on Visit 10    PT Start Time 1319    PT Stop Time 1413    PT Time Calculation (min) 54 min    Activity Tolerance Patient tolerated treatment well    Behavior During Therapy [facility] for tasks assessed/performed              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   chest - CX3 + 5FU   Squamous cell carcinoma of skin 09/27/2014   left thigh - tx p bx   Substance abuse (HCC)    Past Surgical History:  Procedure Laterality Date   ANKLE SURGERY     Bilateral    CATARACT EXTRACTION Bilateral    CERVICAL SPINE SURGERY  07/21/2021   LAPAROSCOPIC APPENDECTOMY N/A 05/11/2023   Procedure: APPENDECTOMY LAPAROSCOPIC;  Surgeon: Violeta Gelinas, MD;  Location: [facility] OR;  Service: General;  Laterality: N/A;   TUBAL LIGATION     Patient Active Problem List   Diagnosis Date Noted   S/P appendectomy 05/11/2023   S/P laparoscopic appendectomy 05/11/2023   Gasping for breath 08/19/2021   PTSD (post-traumatic stress disorder) 08/19/2021   Parasomnia overlap disorder 08/19/2021   Snoring 08/19/2021    Excessive daytime sleepiness 08/19/2021   Sleep paralysis, recurrent isolated 08/19/2021   Asthma 02/19/2021   Multiple drug allergies 02/19/2021   Gluten intolerance 02/19/2021   Chronic rhinitis 02/19/2021   Metatarsalgia of left foot 11/14/2020   Achilles tendinosis of right lower extremity 11/14/2020   Essential tremor 07/04/2019   DOE (dyspnea on exertion) 01/20/2019   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 06/13/2018   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   Superficial basal cell carcinoma 06/13/2018   Upper airway cough syndrome 06/13/2018   Atrophic vaginitis 02/17/2018   Bilateral leg cramps 02/17/2018   Fatty liver, alcoholic 04/22/2016   Depression 04/19/2016   Withdrawal complaint 04/19/2016   Hx of adenomatous colonic polyps 08/03/2015   Herpes simplex 06/15/2014   GERD (gastroesophageal reflux disease) 10/14/2013   Anxiety state 10/14/2013   Recovering alcoholic in remission (HCC) 10/14/2013   Allergic rhinitis 10/14/2013   Adjustment disorder with anxiety 10/14/2013   Elevated low-density lipoprotein level 10/14/2013   Essential hypertension 10/15/2012   Heart murmur 05/25/2012    PCP: Irena Reichmann DO  REFERRING PROVIDER: Madelyn Brunner, DO  REFERRING DIAG: [phone] (ICD-10-CM) - It band syndrome, left  M25.552 (ICD-10-CM) - Pain of left hip M62.81 (ICD-10-CM) - Weakness of left quadriceps muscle  THERAPY DIAG:  Pain of left hip  Pain in left leg  Muscle weakness (generalized)  Rationale for Evaluation and Treatment: Rehabilitation  ONSET DATE: November 19, 2022  SUBJECTIVE:   SUBJECTIVE STATEMENT: Pt indicatd feeling pretty good since last visit.  Reported some mild symptoms in use of clam shells for Lt.  Pt indicated less tenderness in lateral hip/thigh.   PERTINENT HISTORY: Med Hx: Anxiety, history of carcinoma, asthma, Depression, GERD, Hyperlipidemia, IBS, substance abuse per chart review.   Pt  reported some cognitive decline by per patient.   PAIN:  NPRS scale: no pain upon arrival.  Pain location: Lt hip/thigh Pain description:  Aggravating factors: sitting prolonged, mobility, squatting, SLS Relieving factors: walking, rest, shockwave therapy (maybe 2x)  PRECAUTIONS: None  WEIGHT BEARING RESTRICTIONS: No  FALLS:  Has patient fallen in last 6 months? No  LIVING ENVIRONMENT: Lives in: House/apartment Stairs: not at home   OCCUPATION: Yoga instructor  PLOF: Independent, walking, hiking, yoga, biking.  Has gym  PATIENT GOALS: Reduce pain, get back to activity, get stronger.    OBJECTIVE:   PATIENT SURVEYS:  06/22/2023 FOTO intake: 47   predicted:  57  COGNITION: 06/22/2023 Overall cognitive status: WFL    SENSATION: 06/22/2023 [facility]  MUSCLE LENGTH: 06/22/2023 Passive SLR bilateral > 90 deg without pain complaints.   POSTURE:  06/22/2023 No Significant postural limitations  PALPATION: 06/22/2023 Trigger points and tenderness noted in Lt glute med/min, max, Lt lateral quad near IT band  LOWER EXTREMITY ROM:   ROM Right 06/22/2023 Left 06/22/2023  Hip flexion [facility]  Hip extension    Hip abduction    Hip adduction    Hip internal rotation 48 32  Hip external rotation 60 50  Knee flexion    Knee extension    Ankle dorsiflexion    Ankle plantarflexion    Ankle inversion    Ankle eversion     (Blank rows = not tested)  LOWER EXTREMITY MMT:  MMT Right 06/22/2023 Left 06/22/2023  Hip flexion 5/5 4/5  Hip extension 4/5 4/5  Hip abduction 4/5 3+/5  Hip adduction    Hip internal rotation    Hip external rotation    Knee flexion 5/5 5/5  Knee extension 5/5 5/5  Ankle dorsiflexion 5/5 5/5  Ankle plantarflexion    Ankle inversion    Ankle eversion     (Blank rows = not tested)  LOWER EXTREMITY SPECIAL TESTS:  06/22/2023 No specific testing  FUNCTIONAL TESTS:  06/22/2023 18 inch chair transfer: able to perform without UE but weight shift  to Rt leg was noted.  Lt SLS: < 5 seconds with trendelenberg Rt SLS: < 10 seconds with trendelenberg  GAIT: 06/22/2023 Independent ambulation in community.  TODAY'S TREATMENT                                                                          DATE: 06/24/2023 Therex: Nustep lvl 5 10 mins UE/LE Clam shell  bilateral 2 x 15 Seated quad set with SLR x 10 bilaterally with slow lowering focus.  Verbal review of existing HEP.    TherActivity (to improve functional transfers, stairs) Leg press double leg 75 lbs slow lowering focus x 15  Neuro Re-ed (to improve balance contro, neuro muscular coordination/recruitment) Tandem stance 1 min x 1 bilateral on foam with slow head turns Lt and Rt in // bars with occasional HHA.  Tandem ambulation fwd/back in // bars with occasional HHA 6 ft x 5 each way Lateral stepping 3 cones x 6 each, performed bilaterally  Seated quad set 5 sec hold x 10 bilaterally   Manual Percussive device to Lt glute med/min, lateral aspect of Lt quad.   TODAY'S TREATMENT                                                                          DATE: 06/22/2023 Therex:    HEP instruction/performance c cues for techniques, handout provided.  Trial set performed of each for comprehension and symptom assessment.  See below for exercise list.  Additional time spent in general review of previous exercise (bike, gym, etc).  Nustep lvl 5 8 mins UE/LE for ROM  Neuro Re-ed Education given on pain monitoring scale for exercise return modification based off symptoms.  Chart given.  Pain neuro science education given regarding neural sensitivity due to chronicity of symptoms.  Detailed myofascial pain component to presentation.   Manual Percussive device to Lt glute med/min, lateral aspect of Lt quad.   Education  during on possible home use to affected areas.   PATIENT EDUCATION:  06/22/2023 Education details: HEP, POC Person educated: Patient Education method: Programmer, multimedia, Demonstration, Verbal cues, and Handouts Education comprehension: verbalized understanding, returned demonstration, and verbal cues required  HOME EXERCISE PROGRAM: Access Code: [email or web address] URL: [email or web address] Date: 06/22/2023 Prepared by: Chyrel Masson  Exercises - Supine Figure 4 Piriformis Stretch  - 2-3 x daily - 7 x weekly - 1 sets - 3-5 reps - 15 hold - Supine Piriformis Stretch with Foot on Ground (Mirrored)  - 2-3 x daily - 7 x weekly - 1 sets - 3-5 reps - 15 hold - Supine Bridge with Resistance Band  - 1-2 x daily - 7 x weekly - 2-3 sets - 10 reps - Clamshell  - 1-2 x daily - 7 x weekly - 2-3 sets - 10 reps - Sit to Stand  - 3 x daily - 7 x weekly - 1 sets - 10 reps  ASSESSMENT:  CLINICAL IMPRESSION: Review of HEP was good recall today.  Pt to benefit from continued general strengthening with balance control improvements to help facilitate ability to return to recreational activity s limitation due to performance/symptoms.  OBJECTIVE IMPAIRMENTS: decreased activity tolerance, decreased balance, decreased coordination, decreased endurance, decreased mobility, difficulty walking, decreased ROM, decreased strength, increased fascial restrictions, impaired perceived functional ability, impaired flexibility, improper body mechanics, and pain.   ACTIVITY LIMITATIONS: carrying, lifting, bending, sitting, standing, squatting, sleeping, stairs, transfers, and locomotion level  PARTICIPATION LIMITATIONS: meal prep, cleaning, interpersonal relationship, driving, shopping, community activity, and occupation  PERSONAL FACTORS:  Med Hx: Anxiety, history of carcinoma, asthma, Depgression, GERD, Hyperlipidemia, IBS, substance abuse per chart review.   are also affecting patient's functional outcome.    REHAB POTENTIAL: Good  CLINICAL DECISION MAKING: Stable/uncomplicated  EVALUATION COMPLEXITY: Low   GOALS: Goals reviewed with patient? Yes  SHORT TERM GOALS: (target date for Short term goals are 3 weeks 07/13/2023)   1.  Patient will demonstrate independent use of home exercise program to maintain progress from in clinic treatments.  Goal status: on going 06/24/2023  LONG TERM GOALS: (target dates for all long term goals are 10 weeks  08/31/2023 )   1. Patient will demonstrate/report pain at worst less than or equal to 2/10 to facilitate minimal limitation in daily activity secondary to pain symptoms.  Goal status: New   2. Patient will demonstrate independent use of home exercise program to facilitate ability to maintain/progress functional gains from skilled physical therapy services.  Goal status: New   3. Patient will demonstrate FOTO outcome > or = 57 % to indicate reduced disability due to condition.  Goal status: New   4.  Patient will demonstrate bilateral  LE MMT 5/5 throughout to faciltiate usual transfers, stairs, squatting at [facility] for daily life.   Goal status: New   5.  Patient will demonstrate/report ability to return to YOGA instruction and gym workouts at [facility].  Goal status: New   6.  Patient will demonstrate bilateral SLS > 10 seconds to facilitate stability in ambulation.  Goal status: New    PLAN:  PT FREQUENCY: 1-2x/week  PT DURATION: 10 weeks  PLANNED INTERVENTIONS: Can include [id number]- PT Re-evaluation, 97110-Therapeutic exercises, 97530- Therapeutic activity, [id number]- Neuromuscular re-education, 97535- Self Care, 97140- Manual therapy, [phone]- Gait training, [phone]- Orthotic Fit/training, [phone]- Canalith repositioning, [id number]- Aquatic Therapy, 97014- Electrical stimulation (unattended), [id number]- Electrical stimulation (manual), [id number] Physical performance testing, 97016- Vasopneumatic device, [id number]- Ultrasound, [id number]- Traction (mechanical), [id number]-  Ionotophoresis 4mg /ml Dexamethasone, Patient/Family education, Balance training, Stair training, Taping, Dry Needling, Joint mobilization, Joint manipulation, Spinal manipulation, Spinal mobilization, Scar mobilization, Vestibular training, Visual/preceptual remediation/compensation, DME instructions, Cryotherapy, and Moist heat.  All performed as medically necessary.  All included unless contraindicated  PLAN FOR NEXT SESSION:Progressive balance and strengthening improvements.    Chyrel Masson, PT, DPT, OCS, ATC 06/24/23  2:16 PM   PHYSICAL THERAPY DISCHARGE SUMMARY  Visits from Start of Care: 2  Current functional level related to goals / functional outcomes: See note   Remaining deficits: See note   Education / Equipment: HEP  Patient goals were partially met. Patient is being discharged due to not returning since the last visit.  Chyrel Masson, PT, DPT, OCS, ATC 07/14/23  3:13 PM

## 2023-06-28 ENCOUNTER — Other Ambulatory Visit (HOSPITAL_BASED_OUTPATIENT_CLINIC_OR_DEPARTMENT_OTHER): Payer: Self-pay | Admitting: Pulmonary Disease

## 2023-06-29 ENCOUNTER — Other Ambulatory Visit (HOSPITAL_COMMUNITY): Payer: Self-pay

## 2023-06-29 ENCOUNTER — Telehealth: Payer: Self-pay

## 2023-06-29 NOTE — Telephone Encounter (Signed)
Pharmacy Patient Advocate Encounter  Insurance verification completed.   The patient is insured through  [employer].    Ran test claim for Viread. Currently a quantity of 30 is a 30 day supply and the co-pay is $43.78 . Baraclude $53.16 Vemlidy -Non Formulary , Patient will need to try tenofovir m Entecavir 1st.  This test claim was processed through [facility]- copay amounts may vary at other pharmacies due to pharmacy/plan contracts, or as the patient moves through the different stages of their insurance plan.

## 2023-06-30 ENCOUNTER — Encounter: Payer: Self-pay | Admitting: Rehabilitative and Restorative Service Providers"

## 2023-07-01 ENCOUNTER — Other Ambulatory Visit: Payer: Self-pay

## 2023-07-01 ENCOUNTER — Ambulatory Visit (INDEPENDENT_AMBULATORY_CARE_PROVIDER_SITE_OTHER): Payer: Medicare HMO | Admitting: Infectious Diseases

## 2023-07-01 ENCOUNTER — Encounter: Payer: Self-pay | Admitting: Infectious Diseases

## 2023-07-01 ENCOUNTER — Ambulatory Visit: Payer: Medicare HMO | Admitting: Infectious Diseases

## 2023-07-01 VITALS — BP 146/87 | HR 67 | Temp 98.0°F | Ht 67.0 in | Wt 162.0 lb

## 2023-07-01 DIAGNOSIS — Z205 Contact with and (suspected) exposure to viral hepatitis: Secondary | ICD-10-CM | POA: Insufficient documentation

## 2023-07-01 DIAGNOSIS — Z113 Encounter for screening for infections with a predominantly sexual mode of transmission: Secondary | ICD-10-CM | POA: Diagnosis not present

## 2023-07-01 DIAGNOSIS — Z87898 Personal history of other specified conditions: Secondary | ICD-10-CM | POA: Diagnosis not present

## 2023-07-01 DIAGNOSIS — Z23 Encounter for immunization: Secondary | ICD-10-CM

## 2023-07-01 NOTE — Progress Notes (Addendum)
Patient Active Problem List   Diagnosis Date Noted   S/P appendectomy 05/11/2023   S/P laparoscopic appendectomy 05/11/2023   Gasping for breath 08/19/2021   PTSD (post-traumatic stress disorder) 08/19/2021   Parasomnia overlap disorder 08/19/2021   Snoring 08/19/2021   Excessive daytime sleepiness 08/19/2021   Sleep paralysis, recurrent isolated 08/19/2021   Asthma 02/19/2021   Multiple drug allergies 02/19/2021   Gluten intolerance 02/19/2021   Chronic rhinitis 02/19/2021   Metatarsalgia of left foot 11/14/2020   Achilles tendinosis of right lower extremity 11/14/2020   Essential tremor 07/04/2019   DOE (dyspnea on exertion) 01/20/2019   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 06/13/2018   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   Superficial basal cell carcinoma 06/13/2018   Upper airway cough syndrome 06/13/2018   Atrophic vaginitis 02/17/2018   Bilateral leg cramps 02/17/2018   Fatty liver, alcoholic 04/22/2016   Depression 04/19/2016   Withdrawal complaint 04/19/2016   Hx of adenomatous colonic polyps 08/03/2015   Herpes simplex 06/15/2014   GERD (gastroesophageal reflux disease) 10/14/2013   Anxiety state 10/14/2013   Recovering alcoholic in remission (HCC) 10/14/2013   Allergic rhinitis 10/14/2013   Adjustment disorder with anxiety 10/14/2013   Elevated low-density lipoprotein level 10/14/2013   Essential hypertension 10/15/2012   Heart murmur 05/25/2012    Patient's Medications  New Prescriptions   No medications on file  Previous Medications   ALBUTEROL (VENTOLIN HFA) 108 (90 BASE) MCG/ACT INHALER    Inhale 2 puffs into the lungs every 4 (four) hours as needed.   B COMPLEX VITAMINS TABLET    Take 1 tablet by mouth daily.     BUDESONIDE-FORMOTEROL (SYMBICORT) 80-4.5 MCG/ACT INHALER    TAKE 2 PUFFS BY MOUTH EVERY 6 HOURS AS NEEDED   CALCIUM GLUCONATE 500 MG TABLET    Take 500 mg by mouth daily.    CICLOPIROX (PENLAC) 8 % SOLUTION    Apply 1 Application topically daily. For the toenail   DICLOFENAC SODIUM (VOLTAREN) 1 % GEL    Apply 2 g topically daily as needed.   ESTRADIOL (YUVAFEM) 10 MCG TABS VAGINAL TABLET    Place 1 tablet vaginally once a week. On Sundays   LISINOPRIL (ZESTRIL) 5 MG TABLET    TAKE 1 TABLET BY MOUTH DAILY FOR ELEVATED BP WHILE TRAVELING   LORATADINE (CLARITIN) 10 MG TABLET    Take 10 mg by mouth daily as needed (allergy season).   MAGNESIUM 500 MG TABS    Take 1 tablet by mouth daily.     METOPROLOL TARTRATE (LOPRESSOR) 25 MG TABLET    Take 0.5 tablets (12.5 mg total) by mouth every 12 (twelve) hours as needed (for palpitations or increased HR > 100).   MULTIPLE VITAMIN (MULTIVITAMIN) CAPSULE    Take 1 capsule by mouth daily.     PROPRANOLOL (INDERAL) 10 MG TABLET    Take 10 mg by mouth as needed.   VALACYCLOVIR (VALTREX) 500 MG TABLET    ONE BY MOUTH TWICE A DAY X 5 DAYS FOR HERPES OUTBREAK  Modified Medications   No medications on file  Discontinued Medications   No medications on file    Subjective: Discussed the use of AI scribe software for clinical note transcription with the patient, who gave verbal consent to proceed.   73 Y O female with PMH as below including prior h/o substance abuse who is referred from PCP in the  context of ? HBV exposure from her spouse.    She reports her husband was initially flagged for potential Hepatitis B during a blood donation, which led to further testing with some tests coming back positive and others negative, leading to confusion about his actual Hepatitis B status. She showed me results of her husband ( with husband's permission) negative HBsAg, and Hep B core ab (-) in Dec 2024 as well as HbsAb (-) and HBcore IgM(+) in Jna 2025. She reports her husband was seen by an ID provider and was told test was not s/o active hepatitis B and was vaccinated. She was later tested for HBV by her PCP with negative HB surface ag and she  requested her PCP to see ID specialist.   She reports being in a monogamous relationship with her husband for 36 years. She is not sexually active with her husband frequently and her last sexual encounter with him was three months ago. She reports history of drug use in the 1980s, including cocaine and marijuana, but denies any ever using IVDU or recent drug use or exposure to needles. Quit smoking in 1998, and stopped drinking alcohol about 10 years ago. 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   She reports h/o history of cognitive decline and occasional cough due to asthma as well as heart palpitations managed with medication.   She feels well otherwise with no other complaints.   Review of Systems: all systems reviewed with pertinent positives and negatives as listed above  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   chest - CX3 + 5FU   Squamous cell carcinoma of skin 09/27/2014   left thigh - tx p bx   Substance abuse (HCC)    Past Surgical History:  Procedure Laterality Date   ANKLE SURGERY     Bilateral    CATARACT EXTRACTION Bilateral    CERVICAL SPINE SURGERY  07/21/2021   LAPAROSCOPIC APPENDECTOMY N/A 05/11/2023   Procedure: APPENDECTOMY LAPAROSCOPIC;  Surgeon: Violeta Gelinas, MD;  Location: [facility] OR;  Service: General;  Laterality: N/A;   TUBAL LIGATION      Social History   Tobacco Use   Smoking status: Former     Current packs/day: 0.00    Types: Cigarettes    Start date: 10/16/1966    Quit date: 10/15/1996    Years since quitting: 26.7   Smokeless tobacco: Never  Vaping Use   Vaping status: Never Used  Substance Use Topics   Alcohol use: No   Drug use: No    Family History  Problem Relation Age of Onset   Dementia Mother    Diabetes Mother    Melanoma Father    Colon cancer Father 6       Survived   Heart disease Father    Heart attack  Father    Rheum arthritis Father    Dementia Father    Healthy Sister    Healthy Brother    Dementia Maternal Aunt    Dementia Maternal Uncle    Colon polyps Paternal Aunt    Pancreatic cancer Paternal Aunt    Rectal cancer Paternal Aunt    Colon cancer Paternal Aunt    Colon cancer Paternal Grandmother    Breast cancer Maternal Grandmother    Stomach cancer Neg Hx    Esophageal cancer Neg Hx    Liver cancer Neg Hx     Allergies  Allergen Reactions   Ciprofloxacin     Neuropathy    Corticosteroids Other (See Comments)    Unable to take oral, topical or injectable    Doxycycline Nausea Only   Gluten Meal     GI upset    Incruse Ellipta [Umeclidinium Bromide]     Urinary retention   Penicillins Nausea And Vomiting    "breaking out"   Prednisone Other (See Comments)    Hyperactivity, mood changes, nausea   Quinolones Other (See Comments)    REACTION: sick / neuropathy     Health Maintenance  Topic Date Due   Pneumonia Vaccine 80+ Years old (1 of 2 - PCV) Never done   Zoster Vaccines- Shingrix (1 of 2) Never done   MAMMOGRAM  12/27/2020   COVID-19 Vaccine (5 - 2024-25 season) 01/10/2023   Medicare Annual Wellness (AWV)  08/17/2023   Colonoscopy  07/10/2026   DTaP/Tdap/Td (3 - Td or Tdap) 07/11/2028   DEXA SCAN  Completed   Hepatitis C Screening  Completed   HPV VACCINES  Aged Out   INFLUENZA VACCINE  Discontinued    Objective: BP (!) 146/87   Pulse 67   Temp 98 F (36.7 C) (Temporal)   Ht 5\' 7"  (1.702 m)   Wt 162 lb  (73.5 kg)   SpO2 99%   BMI 25.37 kg/m    Physical Exam Constitutional:      Appearance: Normal appearance.  HENT:     Head: Normocephalic and atraumatic.      Mouth: Mucous membranes are moist.  Eyes:    Conjunctiva/sclera: Conjunctivae normal.     Pupils: Pupils are equal, round, and b/l symmetrical  Cardiovascular:     Rate and Rhythm: Normal rate and regular rhythm.     Heart sounds: s1s2  Pulmonary:     Effort: Pulmonary effort is normal.     Breath sounds: Normal breath sounds.   Abdominal:     General: Non distended     Palpations: soft.   Musculoskeletal:        General: Normal range of motion.   Skin:    General: Skin is warm and dry.     Comments:  Neurological:     General: grossly non focal     Mental Status: awake, alert and oriented to person, place, and time.   Psychiatric:        Mood and Affect: Mood normal.   Lab Results Lab Results  Component Value Date   WBC 5.4 05/12/2023   HGB 11.3 (L) 05/12/2023   HCT 33.3 (L) 05/12/2023   MCV 88.8 05/12/2023   PLT 233 05/12/2023    Lab Results  Component Value Date   CREATININE 0.80 05/12/2023   BUN 12 05/12/2023   NA 130 (L) 05/12/2023   K 3.8 05/12/2023   CL 98 05/12/2023   CO2 25 05/12/2023    Lab Results  Component Value Date   ALT 14 05/11/2023   AST 19 05/11/2023   ALKPHOS 55 05/11/2023   BILITOT 0.9 05/11/2023    Lab Results  Component Value Date   CHOL 218 (H) 10/12/2022   HDL 83 10/12/2022   LDLCALC 128 (H) 10/12/2022   TRIG 38 10/12/2022   CHOLHDL 2.6 10/12/2022   No results found for: "LABRPR", "RPRTITER" No results found for: "HIV1RNAQUANT", "HIV1RNAVL", "CD4TABS"   Assessment/Plan # Concern for HBV exposure  - She does not truly have HBV exposure as her husband had negative Hepatitis B surface ag. No other exposure h/o.  - She has tested negative for Hepatitis B twice. 06/17/23 HBsAg negative. 06/21/23 HBsAg negative per PCP records - Will check for Hep B surface ab to  see if she needs to be immunized against HBV.   # STD screening  - 05/2017 HCV ab negative  - verbally agreed to HIV test   # H/o substance use  -  clean for many years   I have personally spent 62  minutes involved in face-to-face and non-face-to-face activities for this patient on the day of the visit. Professional time spent includes the following activities: Preparing to see the patient (review of tests), Obtaining and/or reviewing separately obtained history (admission/discharge record), Performing a medically appropriate examination and/or evaluation , Ordering medications/tests/procedures, referring and communicating with other health care professionals, Documenting clinical information in the EMR, Independently interpreting results (not separately reported), Communicating results to the patient/family/caregiver, Counseling and educating the patient/family/caregiver and Care coordination (not separately reported).   Of note, portions of this note may have been created with voice recognition software. While this note has been edited for accuracy, occasional wrong-word or 'sound-a-like' substitutions may have occurred due to the inherent limitations of voice recognition software.   Victoriano Lain, MD [facility] 07/01/2023, 12:36 PM

## 2023-07-02 LAB — HIV ANTIBODY (ROUTINE TESTING W REFLEX): HIV 1&2 Ab, 4th Generation: NONREACTIVE

## 2023-07-02 LAB — HEPATITIS B SURFACE ANTIBODY, QUANTITATIVE: Hep B S AB Quant (Post): 87 m[IU]/mL (ref >=10)

## 2023-07-05 ENCOUNTER — Ambulatory Visit (HOSPITAL_BASED_OUTPATIENT_CLINIC_OR_DEPARTMENT_OTHER): Payer: Medicare HMO | Admitting: Pulmonary Disease

## 2023-07-05 ENCOUNTER — Encounter: Payer: Self-pay | Admitting: Infectious Diseases

## 2023-07-05 ENCOUNTER — Encounter (HOSPITAL_BASED_OUTPATIENT_CLINIC_OR_DEPARTMENT_OTHER): Payer: Self-pay | Admitting: Pulmonary Disease

## 2023-07-05 VITALS — BP 122/76 | HR 72 | Ht 67.0 in | Wt 157.9 lb

## 2023-07-05 DIAGNOSIS — R911 Solitary pulmonary nodule: Secondary | ICD-10-CM | POA: Diagnosis not present

## 2023-07-05 DIAGNOSIS — J4520 Mild intermittent asthma, uncomplicated: Secondary | ICD-10-CM | POA: Diagnosis not present

## 2023-07-05 NOTE — Patient Instructions (Signed)
 Intermittent asthma Acute bronchitis/asthma attack - improving without steroids --CONTINUE Symbicort 2 puffs every 6 hours as needed --CONTINUE Albuterol AS NEEDED for shortness of breath --Consider taking more consistently 2 puffs twice a day during winter months --Encourage regular aerobic exercise daily

## 2023-07-05 NOTE — Progress Notes (Signed)
 Subjective:   PATIENT ID: Donna [name] GENDER: female DOB: [birth year], MRN: [id number]  Chief Complaint  Patient presents with   Follow-up    Asthma    Reason for Visit: Asthma follow-up  Donna [name] is a 73 year old female former smoker with adult onset asthma, HTN, HLD, essential tremor, IBS, HLD who present for asthma follow-up  Initial consult 03/2022 She was diagnosed with asthma in 2020/2021 with methacholine test in [place]. Previously on Flovent and Alvesco but unable to tolerate due to nausea. She reports unable to take prednisone due severe irritability. She was last seen by Allergy Asthma for initial asthma evaluation in May 2023. On albuterol as needed for stable symptoms. Has previously tried ICS inhaler with no improvement.  Today she reports shortness of breath with walking. Also having non-productive cough. Denies wheezing. Uses albuterol prior to exercise. Cold air, cold food triggers and anxiety as well. Chemicals, strong odors will trigger. No recent exaerbations requiring treatment with antibiotics.  Has been evaluated by GI for reflux and negative. Previously had allergy testing which is negative.   05/27/22 She was not able to tolerate Incruse due to urinary retention. She reports asthma attack associated with cold air and exercise. Used albuterol inhaler once every 2 weeks.  Faxed notes from Dr. Lenord Fellers and Jerre Simon in 2021-2023 reviewed. Notes report +methacholine challenge with 21% decrease in FEV1 discussed on 12/13/19 visit. Previously on Flovent. Sleep study was ordered on 04/29/21 for snoring and frequent nocturnal awakeneings. Falls asleep in meetings and fatigued.  11/05/22 Since our last visit she was prescribed low dose symbicort as needed. Very minimal symptoms since our last visit and was able to even walk in the morning however she recently went to [facility] for a week in early-mid June. She reports increased cough and unsure what triggered it  but was exposed to [facility] and heat throughout the trip. Usually triggered by odors, chemicals, humidity. No illnesses the trip. Now using Symbicort two puffs in the morning with some improvement.   07/05/23 Since our last visit she reports she has been coughing more frequently. Using her symbicort two puffs in the morning. Wheezing at night. Previously used as needed. Scent, dust, humidity trigger her symptoms. Symptoms improve with warmer weather. She walks in the morning.    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   chest - CX3 + 5FU   Squamous cell carcinoma of skin 09/27/2014   left thigh - tx p bx   Substance abuse (HCC)      Family History  Problem Relation Age of Onset   Dementia Mother    Diabetes Mother    Melanoma Father    Colon cancer Father 78       Survived   Heart disease Father    Heart attack Father    Rheum arthritis Father    Dementia Father    Healthy Sister    Healthy Brother    Dementia Maternal Aunt    Dementia Maternal Uncle    Colon polyps Paternal Aunt    Pancreatic cancer Paternal Aunt    Rectal cancer Paternal Aunt    Colon cancer Paternal Aunt    Colon cancer Paternal Grandmother    Breast cancer  Maternal Grandmother    Stomach cancer Neg Hx    Esophageal cancer Neg Hx    Liver cancer Neg Hx      Social History   Occupational History   Occupation: [employer]  Tobacco Use   Smoking status: Former    Current packs/day: 0.00    Types: Cigarettes    Start date: 10/16/1966    Quit date: 10/15/1996    Years since quitting: 26.7   Smokeless tobacco: Never  Vaping Use   Vaping status: Never Used  Substance and Sexual Activity   Alcohol use: No   Drug use: No   Sexual activity: Yes    Allergies  Allergen Reactions   Ciprofloxacin     Neuropathy    Corticosteroids Other (See Comments)    Unable to take oral, topical or injectable    Doxycycline Nausea Only   Gluten Meal     GI upset    Incruse Ellipta [Umeclidinium Bromide]     Urinary retention   Penicillins Nausea And Vomiting    "breaking out"   Prednisone Other (See Comments)    Hyperactivity, mood changes, nausea   Quinolones Other (See Comments)    REACTION: sick / neuropathy      Outpatient Medications Prior to Visit  Medication Sig Dispense Refill   albuterol (VENTOLIN HFA) 108 (90 Base) MCG/ACT inhaler Inhale 2 puffs into the lungs every 4 (four) hours as needed.     b complex vitamins tablet Take 1 tablet by mouth daily.       budesonide-formoterol (SYMBICORT) 80-4.5 MCG/ACT inhaler TAKE 2 PUFFS BY MOUTH EVERY 6 HOURS AS NEEDED 10.2 each 2   calcium gluconate 500 MG tablet Take 500 mg by mouth daily.     ciclopirox (PENLAC) 8 % solution Apply 1 Application topically daily. For the toenail     diclofenac Sodium (VOLTAREN) 1 % GEL Apply 2 g topically daily as needed.     Estradiol (YUVAFEM) 10 MCG TABS vaginal tablet Place 1 tablet vaginally once a week. On Sundays     lisinopril (ZESTRIL) 5 MG tablet TAKE 1 TABLET BY MOUTH DAILY FOR ELEVATED BP WHILE TRAVELING 90 tablet 1   loratadine (CLARITIN) 10 MG tablet Take 10 mg by mouth daily as needed (allergy season).     Magnesium 500 MG TABS Take 1 tablet by mouth  daily.       metoprolol tartrate (LOPRESSOR) 25 MG tablet Take 0.5 tablets (12.5 mg total) by mouth every 12 (twelve) hours as needed (for palpitations or increased HR > 100). 30 tablet 6   Multiple Vitamin (MULTIVITAMIN) capsule Take 1 capsule by mouth daily.       propranolol (INDERAL) 10 MG tablet Take 10 mg by mouth as needed.     valACYclovir (VALTREX) 500 MG tablet ONE  BY MOUTH TWICE A DAY X 5 DAYS FOR HERPES OUTBREAK 10 tablet prn   No facility-administered medications prior to visit.    Review of Systems  Constitutional:  Negative for chills, diaphoresis, fever, malaise/fatigue and weight loss.  HENT:  Negative for congestion.   Respiratory:  Positive for cough and wheezing. Negative for hemoptysis, sputum production and shortness of breath.   Cardiovascular:  Negative for chest pain, palpitations and leg swelling.     Objective:   Vitals:   07/05/23 0932  BP: 122/76  Pulse: 72  SpO2: 97%  Weight: 157 lb 14.4 oz (71.6 kg)  Height: 5\' 7"  (1.702 m)    SpO2: 97 %  Physical Exam: General: Well-appearing, no acute distress HENT: [place], AT Eyes: EOMI, no scleral icterus Respiratory: Clear to auscultation bilaterally.  No crackles, wheezing or rales Cardiovascular: RRR, -M/R/G, no JVD Extremities:-Edema,-tenderness Neuro: AAO x4, CNII-XII grossly intact Psych: Normal mood, normal affect  Data Reviewed:  Imaging: CT Chest 06/16/21 ([facility] report only) - 2 mm LL nodule, decreased compared to prior CT Cardiac 05/06/22 - Visualized lung parenchyma with stable 3mm LLL lung nodule. No masses, infiltrate, effusion or pneumothorax. CT Coronary 08/06/22 - Visualized lung parenchyma with no pulmonary nodules, masses, infiltrate, effusion or pneumothorax.  PFT: 09/07/19 FVC 3.07 (86%) FEV1 2.35 (86%) Ratio 76  TLC 74% DLCO 93% Interpretation: Mild obstructive and restrictive defect. Normal DLCO  Spirometry 08/25/21 FVC 2.25 (71%) FEV1 1.42 (58%) Ratio 0.63  Spirometry  10/03/21 FVC 1.98 (62%) FEV1 1.5 (61%) Ratio 0.76  Labs: CBC    Component Value Date/Time   WBC 5.4 05/12/2023 0808   RBC 3.75 (L) 05/12/2023 0808   HGB 11.3 (L) 05/12/2023 0808   HCT 33.3 (L) 05/12/2023 0808   PLT 233 05/12/2023 0808   MCV 88.8 05/12/2023 0808   MCH 30.1 05/12/2023 0808   MCHC 33.9 05/12/2023 0808   RDW 12.1 05/12/2023 0808   LYMPHSABS 882 07/02/2020 0924   MONOABS 0.5 01/18/2019 1228   EOSABS 61 07/02/2020 0924   BASOSABS 30 07/02/2020 0924   Abs eos 07/02/20 - 61     Assessment & Plan:   Discussion: 73 year old female former smoker with adult onset asthma, HTN, HLD, essential tremor, IBS, HLD who present for asthma follow-up. Symptoms improved on low-dose Symbicort. Counseled on bronchodilator management as noted below  Unable to tolerate ICS due to agitation (previously on Flovent). Unable to tolerate LAMA due to urinary retention. Discussed option for ICS/SABA PRN.    If needed in the future, consider low dose prednisone for exacerbation due to agitation (20 mg).  Intermittent asthma Acute bronchitis/asthma attack - improving without steroids --CONTINUE Symbicort 2 puffs every 6 hours as needed --CONTINUE Albuterol AS NEEDED for shortness of breath --Consider taking more consistently 2 puffs twice a day during winter months --Encourage regular aerobic exercise daily  Left lower lobe lung nodule, benign --No further follow-up    Health Maintenance Immunization History  Administered Date(s) Administered   Influenza,trivalent, recombinat, inj, PF 02/16/2014   Influenza-Unspecified 02/22/2014, 02/05/2015, 02/05/2015   PFIZER Comirnaty(Gray Top)Covid-19 Tri-Sucrose Vaccine 06/11/2020   PFIZER(Purple Top)SARS-COV-2 Vaccination 07/09/2019, 08/02/2019, 12/28/2019   Tdap 05/26/2007, 07/12/2018   CT Lung Screen- not qualified. Quit smoking >15 years  No orders of the defined types were placed in this encounter.  No orders of the defined types were  placed in this encounter.   Return in about 6 months (around 01/02/2024).   I have spent a total time of 32-minutes  on the day of the appointment including chart review, data review, collecting history, coordinating care and discussing medical diagnosis and plan with the patient/family. Past medical history, allergies, medications were reviewed. Pertinent imaging, labs and tests included in this note have been reviewed and interpreted independently by me.  Dionisio Aragones Mechele Collin, MD [facility] 07/05/2023 10:00 AM  Office Number [phone]

## 2023-07-09 ENCOUNTER — Encounter: Payer: Medicare HMO | Admitting: Physical Therapy

## 2023-07-13 ENCOUNTER — Encounter: Payer: Medicare HMO | Admitting: Rehabilitative and Restorative Service Providers"

## 2023-07-15 ENCOUNTER — Encounter: Payer: Medicare HMO | Admitting: Rehabilitative and Restorative Service Providers"

## 2023-07-20 ENCOUNTER — Encounter: Payer: Medicare HMO | Admitting: Rehabilitative and Restorative Service Providers"

## 2023-07-20 ENCOUNTER — Encounter: Payer: Self-pay | Admitting: Sports Medicine

## 2023-07-20 ENCOUNTER — Encounter (HOSPITAL_BASED_OUTPATIENT_CLINIC_OR_DEPARTMENT_OTHER): Payer: Self-pay | Admitting: Pulmonary Disease

## 2023-07-21 ENCOUNTER — Other Ambulatory Visit: Payer: Self-pay | Admitting: Sports Medicine

## 2023-07-21 ENCOUNTER — Encounter: Payer: Medicare HMO | Admitting: Rehabilitative and Restorative Service Providers"

## 2023-07-21 DIAGNOSIS — M6281 Muscle weakness (generalized): Secondary | ICD-10-CM

## 2023-07-21 DIAGNOSIS — M7632 Iliotibial band syndrome, left leg: Secondary | ICD-10-CM

## 2023-07-21 DIAGNOSIS — M25552 Pain in left hip: Secondary | ICD-10-CM

## 2023-07-21 LAB — COMPREHENSIVE METABOLIC PANEL
ALT: 17 IU/L (ref 0–32)
AST: 23 IU/L (ref 0–40)
Albumin: 4.1 g/dL (ref 3.8–4.8)
Alkaline Phosphatase: 77 IU/L (ref 44–121)
BUN/Creatinine Ratio: 15 (ref 12–28)
BUN: 10 mg/dL (ref 8–27)
Bilirubin Total: 0.4 mg/dL (ref 0.0–1.2)
CO2: 23 mmol/L (ref 20–29)
Calcium: 9.1 mg/dL (ref 8.7–10.3)
Chloride: 99 mmol/L (ref 96–106)
Creatinine, Ser: 0.68 mg/dL (ref 0.57–1.00)
Globulin, Total: 2.3 g/dL (ref 1.5–4.5)
Glucose: 91 mg/dL (ref 70–99)
Potassium: 4.3 mmol/L (ref 3.5–5.2)
Sodium: 137 mmol/L (ref 134–144)
Total Protein: 6.4 g/dL (ref 6.0–8.5)
eGFR: 93 mL/min/{1.73_m2} (ref >59)

## 2023-07-21 LAB — NMR, LIPOPROFILE
Cholesterol, Total: 218 mg/dL — ABNORMAL HIGH (ref 100–199)
HDL Particle Number: 40.9 umol/L (ref >=30.5)
HDL-C: 82 mg/dL (ref >39)
LDL Particle Number: 1101 nmol/L — ABNORMAL HIGH (ref <1000)
LDL Size: 21.4 nm (ref >20.5)
LDL-C (NIH Calc): 127 mg/dL — ABNORMAL HIGH (ref 0–99)
LP-IR Score: <25 (ref <=45)
Small LDL Particle Number: 243 nmol/L (ref <=527)
Triglycerides: 52 mg/dL (ref 0–149)

## 2023-07-21 LAB — LIPOPROTEIN A (LPA): Lipoprotein (a): 14.8 nmol/L (ref <75.0)

## 2023-07-22 ENCOUNTER — Encounter: Payer: Medicare HMO | Admitting: Physical Therapy

## 2023-07-22 ENCOUNTER — Encounter: Payer: Medicare HMO | Admitting: Rehabilitative and Restorative Service Providers"

## 2023-07-26 ENCOUNTER — Ambulatory Visit (HOSPITAL_BASED_OUTPATIENT_CLINIC_OR_DEPARTMENT_OTHER)
Admission: RE | Admit: 2023-07-26 | Discharge: 2023-07-26 | Disposition: A | Discharge status: Home / Self Care | Payer: Medicare HMO | Source: Ambulatory Visit | Attending: Nurse Practitioner | Admitting: Nurse Practitioner

## 2023-07-26 DIAGNOSIS — I2510 Atherosclerotic heart disease of native coronary artery without angina pectoris: Secondary | ICD-10-CM | POA: Diagnosis not present

## 2023-07-26 DIAGNOSIS — I25118 Atherosclerotic heart disease of native coronary artery with other forms of angina pectoris: Secondary | ICD-10-CM | POA: Insufficient documentation

## 2023-07-26 DIAGNOSIS — I77810 Thoracic aortic ectasia: Secondary | ICD-10-CM | POA: Diagnosis not present

## 2023-07-26 DIAGNOSIS — E785 Hyperlipidemia, unspecified: Secondary | ICD-10-CM | POA: Insufficient documentation

## 2023-07-26 DIAGNOSIS — I77819 Aortic ectasia, unspecified site: Secondary | ICD-10-CM | POA: Diagnosis not present

## 2023-07-26 DIAGNOSIS — I700 Atherosclerosis of aorta: Secondary | ICD-10-CM | POA: Diagnosis not present

## 2023-07-26 DIAGNOSIS — G250 Essential tremor: Secondary | ICD-10-CM | POA: Diagnosis not present

## 2023-07-26 DIAGNOSIS — I517 Cardiomegaly: Secondary | ICD-10-CM | POA: Diagnosis not present

## 2023-07-26 MED ORDER — IOHEXOL 350 MG/ML SOLN
100.0000 mL | Freq: Once | INTRAVENOUS | Status: AC | PRN
  Administered 2023-07-26: 75 mL via INTRAVENOUS

## 2023-07-27 ENCOUNTER — Encounter (HOSPITAL_BASED_OUTPATIENT_CLINIC_OR_DEPARTMENT_OTHER): Payer: Self-pay | Admitting: Pulmonary Disease

## 2023-07-27 MED ORDER — MONTELUKAST SODIUM 10 MG PO TABS: 10.0000 mg | ORAL_TABLET | Freq: Every day | ORAL | 2 refills | Status: DC

## 2023-07-27 NOTE — Telephone Encounter (Signed)
**Note De-identified  [place] Obfuscation** Please advise 

## 2023-07-28 DIAGNOSIS — Z0184 Encounter for antibody response examination: Secondary | ICD-10-CM | POA: Diagnosis not present

## 2023-07-28 DIAGNOSIS — B370 Candidal stomatitis: Secondary | ICD-10-CM | POA: Diagnosis not present

## 2023-07-30 NOTE — Therapy (Addendum)
 OUTPATIENT PHYSICAL THERAPY  EVALUATION   Patient Name: Donna [name] MRN: [id number] DOB:1950-10-25, 73 y.o., female Today's Date: 08/02/2023  END OF SESSION:  PT End of Session - 08/02/23 0853     Visit Number 1    Number of Visits 16    Date for PT Re-Evaluation 09/27/23    Authorization Type [employer] $10 copay    Progress Note Due on Visit 10    PT Start Time 0800    PT Stop Time 0843    PT Time Calculation (min) 43 min    Activity Tolerance Patient tolerated treatment well;No increased pain    Behavior During Therapy [facility] for tasks assessed/performed               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   chest - CX3 + 5FU   Squamous cell carcinoma of skin 09/27/2014   left thigh - tx p bx   Substance abuse (HCC)    Past Surgical History:  Procedure Laterality Date   ANKLE SURGERY     Bilateral    CATARACT EXTRACTION Bilateral    CERVICAL SPINE SURGERY  07/21/2021   LAPAROSCOPIC APPENDECTOMY N/A 05/11/2023   Procedure: APPENDECTOMY LAPAROSCOPIC;  Surgeon: Violeta Gelinas, MD;  Location: [facility];  Service: General;  Laterality: N/A;   TUBAL LIGATION     Patient Active Problem List   Diagnosis Date Noted   Exposure to hepatitis B 07/01/2023   Need for immunization against viral hepatitis 07/01/2023   Screening for STDs (sexually transmitted diseases) 07/01/2023   History of substance use 07/01/2023   S/P  appendectomy 05/11/2023   S/P laparoscopic appendectomy 05/11/2023   Gasping for breath 08/19/2021   PTSD (post-traumatic stress disorder) 08/19/2021   Parasomnia overlap disorder 08/19/2021   Snoring 08/19/2021   Excessive daytime sleepiness 08/19/2021   Sleep paralysis, recurrent isolated 08/19/2021   Asthma 02/19/2021   Multiple drug allergies 02/19/2021   Gluten intolerance 02/19/2021   Chronic rhinitis 02/19/2021   Metatarsalgia of left foot 11/14/2020   Achilles tendinosis of right lower extremity 11/14/2020   Essential tremor 07/04/2019   DOE (dyspnea on exertion) 01/20/2019   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 06/13/2018   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   Superficial basal cell carcinoma 06/13/2018   Upper airway cough syndrome 06/13/2018   Atrophic vaginitis 02/17/2018   Bilateral leg cramps 02/17/2018   Fatty liver, alcoholic 04/22/2016   Depression 04/19/2016   Withdrawal complaint 04/19/2016   Hx of adenomatous colonic polyps 08/03/2015   Herpes simplex 06/15/2014   GERD (gastroesophageal reflux disease) 10/14/2013   Anxiety state 10/14/2013   Recovering alcoholic in remission (HCC) 10/14/2013   Allergic rhinitis 10/14/2013   Adjustment disorder with anxiety 10/14/2013   Elevated low-density lipoprotein level 10/14/2013  Essential hypertension 10/15/2012   Heart murmur 05/25/2012    PCP: Irena Reichmann DO  REFERRING PROVIDER: Madelyn Brunner, DO  REFERRING DIAG: [phone] (ICD-10-CM) - It band syndrome, left M25.552 (ICD-10-CM) - Pain of left hip M62.81 (ICD-10-CM) - Weakness of left quadriceps muscle  THERAPY DIAG:  Pain of left hip  Pain in left leg  Muscle weakness (generalized)  Difficulty in walking, not elsewhere classified  Rationale for Evaluation and Treatment: Rehabilitation  ONSET DATE: November 19, 2022  SUBJECTIVE:   SUBJECTIVE STATEMENT: Pt was recently seen by clinic before missing  about 1.40mo prior to today's eval. Today, patient states that the Lt thigh pain is better but still lingers and gets worse with activity.  Patient states that she went back to the gym and had the leg pain increase. Machines such as leg press, clam shells seem to aggravate it the most. Was bale to go back to practicing yoga however still has anterior/lateral hip pain.   History from previous eval on 06/22/2023: Pt indicated Dec 31st having emergency appendectomy that took her off exercise for last 6 weeks. Pt indicated chronic history of complaints related to Lt hip/thigh anterior/laterally.  Pt indicated pain was severe at times with difficulty c movement and cross legs, impacted sleeping on either side.  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  Pt indicated sitting prolonged (30 mins) with complaints in anterior thigh and noted when trying to stand.  Thought rest over last 6 weeks was helpful.  Pt indicated initial complaints were in July and felt the pain but unsure what did. Pt indicated no numbness/tingling.    Had trouble with leg press in past.    Pt indicated seeing PT prior to appendectomy.  PERTINENT HISTORY: Med Hx: Anxiety, history of carcinoma, asthma, Depression, GERD, Hyperlipidemia, IBS, substance abuse per chart review.   Pt reported some cognitive decline by per patient.   PAIN:  NPRS scale: 1-2/10 currently, 2-3/10 at worst  Pain location: Lt hip/thigh Pain description: "grabbing" Aggravating factors: sitting prolonged, squatting, SLS Relieving factors: walking, rest, shockwave therapy (maybe 2x)  PRECAUTIONS: None  WEIGHT BEARING RESTRICTIONS: No  FALLS:  Has patient fallen in last 6 months? No  LIVING ENVIRONMENT: Lives in: House/apartment Stairs: not at home   OCCUPATION: Yoga instructor  PLOF: Independent, walking, hiking, yoga, biking.  Has gym  PATIENT GOALS: Reduce pain, get back to activity, get stronger.    OBJECTIVE:   PATIENT  SURVEYS:  Patient-Specific Activity Scoring Scheme  "0" represents "unable to perform." "10" represents "able to perform at prior level. 0 1 2 3 4 5 6 7 8 9  10 (Date and Score)   Activity Eval 08/02/2023    1. Yoga with bent knee poses  5    2. Sitting for a long time  3    3. Stairs 5   4.    5.    Score 4.33/10    Total score = sum of the activity scores/number of activities Minimum detectable change (90%CI) for average score = 2 points Minimum detectable change (90%CI) for single activity score = 3 points   COGNITION: 08/02/2023 Overall cognitive status: WFL    SENSATION: 08/02/2023 [facility]  MUSCLE LENGTH: 08/02/2023 Passive SLR bilateral > 90 deg without pain complaints.   POSTURE:  08/02/2023 No Significant postural limitations  PALPATION: 08/02/2023 Trigger points and tenderness noted in Lt glute med/min, max, Lt lateral quad near IT band  LOWER EXTREMITY ROM:   ROM Right 08/02/2023 Left  08/02/2023  Hip flexion [facility] [place]  Hip extension    Hip abduction    Hip adduction    Hip internal rotation 48 32  Hip external rotation 60 50  Knee flexion    Knee extension    Ankle dorsiflexion    Ankle plantarflexion    Ankle inversion    Ankle eversion     (Blank rows = not tested)  LOWER EXTREMITY MMT:  MMT Right 08/02/2023 Left 08/02/2023  Hip flexion 5/5 4-/5; no pain  Hip extension    Hip abduction 4/5 4-/5; no pain  Hip adduction    Hip internal rotation    Hip external rotation    Knee flexion 5/5 5/5  Knee extension 5/5 5/5  Ankle dorsiflexion 5/5 5/5  Ankle plantarflexion    Ankle inversion    Ankle eversion     (Blank rows = not tested)  LOWER EXTREMITY SPECIAL TESTS:  08/02/2023 No specific testing  FUNCTIONAL TESTS:  08/02/2023 18 inch chair transfer: able to perform without UE but weight shift to Rt leg was noted.  Lt SLS: < 5 seconds with trendelenberg Rt SLS: < 10 seconds with trendelenberg  GAIT: 08/02/2023 Independent ambulation in  community.                                                                                                                                                                         TODAY'S TREATMENT                                                                          DATE: 08/02/2023 Therex: Nustep lvl 5 10 mins UE/LE Supine Clamshell green tband bilateral x15 ea Bridges with hip abduction against green tband x15 ea  Hooklying Isometric Clamshell w/ 5sec hold x10 Sit to Stand x10 no UE use  Verbal review of HEP and updated HEP to address complaints noted in clam shell.   Manual Percussive device to Lt glute med/min, lateral aspect of Lt quad.   TREATMENT                                                                          DATE: 06/24/2023 Therex: Lora Paula  lvl 5 10 mins UE/LE Clam shell  bilateral 2 x 15 Seated quad set with SLR x 10 bilaterally with slow lowering focus.  Verbal review of existing HEP.    TherActivity (to improve functional transfers, stairs) Leg press double leg 75 lbs slow lowering focus x 15  Neuro Re-ed (to improve balance contro, neuro muscular coordination/recruitment) Tandem stance 1 min x 1 bilateral on foam with slow head turns Lt and Rt in // bars with occasional HHA.  Tandem ambulation fwd/back in // bars with occasional HHA 6 ft x 5 each way Lateral stepping 3 cones x 6 each, performed bilaterally  Seated quad set 5 sec hold x 10 bilaterally   Manual Percussive device to Lt glute med/min, lateral aspect of Lt quad.     PATIENT EDUCATION:  08/02/2023 Education details: HEP, POC Person educated: Patient Education method: Explanation, Demonstration, Verbal cues, and Handouts Education comprehension: verbalized understanding, returned demonstration, and verbal cues required  HOME EXERCISE PROGRAM: Access Code: [email or web address] URL: [email or web address] Date: 08/02/2023 Prepared by: Chyrel Masson   Exercises - Supine Figure 4  Piriformis Stretch  - 2-3 x daily - 7 x weekly - 1 sets - 3-5 reps - 15 hold - Supine Piriformis Stretch with Foot on Ground (Mirrored)  - 2-3 x daily - 7 x weekly - 1 sets - 3-5 reps - 15 hold - Supine Bridge with Resistance Band  - 1-2 x daily - 7 x weekly - 2-3 sets - 10 reps - Sit to Stand  - 3 x daily - 7 x weekly - 1 sets - 10 reps - Hooklying Isometric Clamshell  - 1-2 x daily - 7 x weekly - 2-3 sets - 10-15 reps  ASSESSMENT:  CLINICAL IMPRESSION: Patient is a 73 y.o. who comes to clinic with complaints of Lt hip/leg pain with mobility, strength and movement coordination deficits that impair their ability to perform usual daily and recreational functional activities without increase difficulty/symptoms at this time.  Patient to benefit from skilled PT services to address impairments and limitations to improve to previous level of function without restriction secondary to condition.   OBJECTIVE IMPAIRMENTS: decreased activity tolerance, decreased balance, decreased coordination, decreased endurance, decreased mobility, difficulty walking, decreased ROM, decreased strength, increased fascial restrictions, impaired perceived functional ability, impaired flexibility, improper body mechanics, and pain.   ACTIVITY LIMITATIONS: carrying, lifting, bending, sitting, standing, squatting, sleeping, stairs, transfers, and locomotion level  PARTICIPATION LIMITATIONS: meal prep, cleaning, interpersonal relationship, driving, shopping, community activity, and occupation  PERSONAL FACTORS:  Med Hx: Anxiety, history of carcinoma, asthma, Depgression, GERD, Hyperlipidemia, IBS, substance abuse per chart review.   are also affecting patient's functional outcome.   REHAB POTENTIAL: Good  CLINICAL DECISION MAKING: Stable/uncomplicated  EVALUATION COMPLEXITY: Low   GOALS: Goals reviewed with patient? Yes  SHORT TERM GOALS: (target date for Short term goals are 3 weeks 08/23/2023)   1.  Patient will  demonstrate independent use of home exercise program to maintain progress from in clinic treatments.  Goal status: on going 06/24/2023  LONG TERM GOALS: (target dates for all long term goals are 8 weeks  09/27/2023 )   1. Patient will demonstrate/report pain at worst less than or equal to 2/10 to facilitate minimal limitation in daily activity secondary to pain symptoms.  Goal status: New   2. Patient will demonstrate independent use of home exercise program to facilitate ability to maintain/progress functional gains from skilled physical therapy services.  Goal status: New   3. Patient will demonstrate  Patient specific functional scale avg > or = 7 to indicate reduced disability due to condition.  Goal status: New   4.  Patient will demonstrate bilateral  LE MMT 5/5 throughout to faciltiate usual transfers, stairs, squatting at [facility] for daily life.   Goal status: New   5.  Patient will demonstrate/report ability to return to YOGA instruction and gym workouts at [facility].  Goal status: New   6.  Patient will demonstrate bilateral SLS > 10 seconds to facilitate stability in ambulation.  Goal status: New    PLAN:  PT FREQUENCY: 1-2x/week  PT DURATION: 8 weeks  PLANNED INTERVENTIONS: Can include 78295- PT Re-evaluation, 97110-Therapeutic exercises, 97530- Therapeutic activity, [id number]- Neuromuscular re-education, 97535- Self Care, 97140- Manual therapy, [phone]- Gait training, [phone]- Orthotic Fit/training, [phone]- Canalith repositioning, [id number]- Aquatic Therapy, 97014- Electrical stimulation (unattended), [id number]- Electrical stimulation (manual), [id number] Physical performance testing, 97016- Vasopneumatic device, [id number]- Ultrasound, [id number]- Traction (mechanical), [id number]- Ionotophoresis 4mg /ml Dexamethasone, Patient/Family education, Balance training, Stair training, Taping, Dry Needling, Joint mobilization, Joint manipulation, Spinal manipulation, Spinal mobilization, Scar mobilization, Vestibular  training, Visual/preceptual remediation/compensation, DME instructions, Cryotherapy, and Moist heat.  All performed as medically necessary.  All included unless contraindicated  PLAN FOR NEXT SESSION: review HEP, continue Lt LE strengthening, assess balance, continue with percussive device to patient tolerance.    Jenyfer Trawick, Kyla Duffy, Student-PT 08/02/2023, 9:05 AM

## 2023-08-01 ENCOUNTER — Encounter (HOSPITAL_BASED_OUTPATIENT_CLINIC_OR_DEPARTMENT_OTHER): Payer: Self-pay

## 2023-08-01 ENCOUNTER — Other Ambulatory Visit: Payer: Self-pay

## 2023-08-01 ENCOUNTER — Emergency Department (HOSPITAL_BASED_OUTPATIENT_CLINIC_OR_DEPARTMENT_OTHER)

## 2023-08-01 ENCOUNTER — Emergency Department (HOSPITAL_BASED_OUTPATIENT_CLINIC_OR_DEPARTMENT_OTHER)
Admission: EM | Admit: 2023-08-01 | Discharge: 2023-08-01 | Disposition: A | Discharge status: Home / Self Care | Attending: Emergency Medicine | Admitting: Emergency Medicine

## 2023-08-01 DIAGNOSIS — R0789 Other chest pain: Secondary | ICD-10-CM | POA: Diagnosis not present

## 2023-08-01 DIAGNOSIS — Z87891 Personal history of nicotine dependence: Secondary | ICD-10-CM | POA: Diagnosis not present

## 2023-08-01 DIAGNOSIS — I10 Essential (primary) hypertension: Secondary | ICD-10-CM | POA: Diagnosis not present

## 2023-08-01 DIAGNOSIS — Z79899 Other long term (current) drug therapy: Secondary | ICD-10-CM | POA: Diagnosis not present

## 2023-08-01 DIAGNOSIS — R0602 Shortness of breath: Secondary | ICD-10-CM | POA: Insufficient documentation

## 2023-08-01 DIAGNOSIS — I700 Atherosclerosis of aorta: Secondary | ICD-10-CM | POA: Diagnosis not present

## 2023-08-01 DIAGNOSIS — R918 Other nonspecific abnormal finding of lung field: Secondary | ICD-10-CM | POA: Diagnosis not present

## 2023-08-01 DIAGNOSIS — R079 Chest pain, unspecified: Secondary | ICD-10-CM | POA: Diagnosis not present

## 2023-08-01 DIAGNOSIS — R059 Cough, unspecified: Secondary | ICD-10-CM | POA: Insufficient documentation

## 2023-08-01 LAB — CBC WITH DIFFERENTIAL/PLATELET
Abs Immature Granulocytes: 0.02 10*3/uL (ref 0.00–0.07)
Basophils Absolute: 0 10*3/uL (ref 0.0–0.1)
Basophils Relative: 1 %
Eosinophils Absolute: 0.1 10*3/uL (ref 0.0–0.5)
Eosinophils Relative: 2 %
HCT: 34.3 % — ABNORMAL LOW (ref 36.0–46.0)
Hemoglobin: 12.2 g/dL (ref 12.0–15.0)
Immature Granulocytes: 1 %
Lymphocytes Relative: 26 %
Lymphs Abs: 1.1 10*3/uL (ref 0.7–4.0)
MCH: 31.1 pg (ref 26.0–34.0)
MCHC: 35.6 g/dL (ref 30.0–36.0)
MCV: 87.5 fL (ref 80.0–100.0)
Monocytes Absolute: 0.5 10*3/uL (ref 0.1–1.0)
Monocytes Relative: 13 %
Neutro Abs: 2.4 10*3/uL (ref 1.7–7.7)
Neutrophils Relative %: 57 %
Platelets: 239 10*3/uL (ref 150–400)
RBC: 3.92 MIL/uL (ref 3.87–5.11)
RDW: 12.7 % (ref 11.5–15.5)
WBC: 4.1 10*3/uL (ref 4.0–10.5)
nRBC: 0 % (ref 0.0–0.2)

## 2023-08-01 LAB — COMPREHENSIVE METABOLIC PANEL
ALT: 16 U/L (ref 0–44)
AST: 20 U/L (ref 15–41)
Albumin: 3.7 g/dL (ref 3.5–5.0)
Alkaline Phosphatase: 63 U/L (ref 38–126)
Anion gap: 9 (ref 5–15)
BUN: 12 mg/dL (ref 8–23)
CO2: 22 mmol/L (ref 22–32)
Calcium: 8.8 mg/dL — ABNORMAL LOW (ref 8.9–10.3)
Chloride: 101 mmol/L (ref 98–111)
Creatinine, Ser: 0.61 mg/dL (ref 0.44–1.00)
GFR, Estimated: >60 mL/min (ref >60)
Glucose, Bld: 107 mg/dL — ABNORMAL HIGH (ref 70–99)
Potassium: 4 mmol/L (ref 3.5–5.1)
Sodium: 132 mmol/L — ABNORMAL LOW (ref 135–145)
Total Bilirubin: 0.8 mg/dL (ref 0.0–1.2)
Total Protein: 6.5 g/dL (ref 6.5–8.1)

## 2023-08-01 LAB — RESP PANEL BY RT-PCR (RSV, FLU A&B, COVID)  RVPGX2
Influenza A by PCR: NEGATIVE
Influenza B by PCR: NEGATIVE
Resp Syncytial Virus by PCR: NEGATIVE
SARS Coronavirus 2 by RT PCR: NEGATIVE

## 2023-08-01 LAB — TROPONIN I (HIGH SENSITIVITY)
Troponin I (High Sensitivity): 3 ng/L (ref <18)
Troponin I (High Sensitivity): 3 ng/L (ref <18)

## 2023-08-01 LAB — D-DIMER, QUANTITATIVE: D-Dimer, Quant: 0.41 ug{FEU}/mL (ref 0.00–0.50)

## 2023-08-01 LAB — BRAIN NATRIURETIC PEPTIDE: B Natriuretic Peptide: 132.3 pg/mL — ABNORMAL HIGH (ref 0.0–100.0)

## 2023-08-01 MED ORDER — ACETAMINOPHEN 325 MG PO TABS
650.0000 mg | ORAL_TABLET | Freq: Once | ORAL | Status: AC
  Administered 2023-08-01: 650 mg via ORAL
  Filled 2023-08-01: qty 2

## 2023-08-01 NOTE — Discharge Instructions (Signed)
 Follow-up with your cardiologist.  Follow-up with your primary care doctor.  Continue Tylenol for pain.

## 2023-08-01 NOTE — ED Notes (Signed)
 ED Provider at bedside.

## 2023-08-01 NOTE — ED Notes (Signed)
 Pt advised she had two episodes of muscle stabbing and shooting pain, starting at her L rib and felt like it was constricting. The pt advised she also had pain that radiated through her ribs to her L back, starting on the L side.

## 2023-08-01 NOTE — ED Provider Notes (Signed)
 [place] EMERGENCY DEPARTMENT AT [facility] Provider Note   CSN: [id number] Arrival date & time: 08/01/23  4098     History  Chief Complaint  Patient presents with   Chest Pain    Donna [name] is a 73 y.o. female.  Chest pain intermittently all week.  Maybe a little bit different today grabbing more in the left chest across the sternum.  Maybe some shortness of breath.  History of IBS reflux anxiety depression high cholesterol.  She has had cardiac test done in the past but no heart attack.  She denies any fever chills.  Nothing makes it worse or better.  Former smoker.  She does have history of some aortic root dilation just had a CT scan a couple days ago has not had a report yet.  Symptoms started slightly after that.  She does admit to some anxiety.  She has had a cough but no fever or sputum production.  Not sure if coughing makes it worse.  The history is provided by the patient.       Home Medications Prior to Admission medications   Medication Sig Start Date End Date Taking? Authorizing Provider  albuterol (VENTOLIN HFA) 108 (90 Base) MCG/ACT inhaler Inhale 2 puffs into the lungs every 4 (four) hours as needed. 08/04/21   [provider]  b complex vitamins tablet Take 1 tablet by mouth daily.      [provider]  budesonide-formoterol (SYMBICORT) 80-4.5 MCG/ACT inhaler TAKE 2 PUFFS BY MOUTH EVERY 6 HOURS AS NEEDED 06/28/23   Luciano Cutter, MD  calcium gluconate 500 MG tablet Take 500 mg by mouth daily.    [provider]  ciclopirox (PENLAC) 8 % solution Apply 1 Application topically daily. For the toenail 05/01/23   [provider]  diclofenac Sodium (VOLTAREN) 1 % GEL Apply 2 g topically daily as needed.    [provider]  Estradiol (YUVAFEM) 10 MCG TABS vaginal tablet Place 1 tablet vaginally once a week. On Sundays    [provider]  lisinopril (ZESTRIL) 5 MG tablet TAKE 1 TABLET BY MOUTH DAILY  FOR ELEVATED BP WHILE TRAVELING 11/05/19   Margaree Mackintosh, MD  loratadine (CLARITIN) 10 MG tablet Take 10 mg by mouth daily as needed (allergy season).    [provider]  Magnesium 500 MG TABS Take 1 tablet by mouth daily.      [provider]  metoprolol tartrate (LOPRESSOR) 25 MG tablet Take 0.5 tablets (12.5 mg total) by mouth every 12 (twelve) hours as needed (for palpitations or increased HR > 100). 06/10/23   Swinyer, Zachary George, NP  montelukast (SINGULAIR) 10 MG tablet Take 1 tablet (10 mg total) by mouth at bedtime. 07/27/23   Luciano Cutter, MD  Multiple Vitamin (MULTIVITAMIN) capsule Take 1 capsule by mouth daily.      [provider]  propranolol (INDERAL) 10 MG tablet Take 10 mg by mouth as needed. 12/02/20   [provider]  valACYclovir (VALTREX) 500 MG tablet ONE BY MOUTH TWICE A DAY X 5 DAYS FOR HERPES OUTBREAK 11/25/19   Margaree Mackintosh, MD      Allergies    Ciprofloxacin, Corticosteroids, Doxycycline, Gluten meal, Incruse ellipta [umeclidinium bromide], Penicillins, Prednisone, and Quinolones    Review of Systems   Review of Systems  Physical Exam Updated Vital Signs BP (!) 144/67   Pulse (!) 51   Temp 98.3 F (36.8 C) (Oral)   Resp 18  Ht 5\' 7"  (1.702 m)   Wt 71.2 kg   SpO2 100%   BMI 24.59 kg/m  Physical Exam Vitals and nursing note reviewed.  Constitutional:      General: She is not in acute distress.    Appearance: She is well-developed. She is not ill-appearing.  HENT:     Head: Normocephalic and atraumatic.     Nose: Nose normal.     Mouth/Throat:     Mouth: Mucous membranes are moist.  Eyes:     Extraocular Movements: Extraocular movements intact.     Conjunctiva/sclera: Conjunctivae normal.     Pupils: Pupils are equal, round, and reactive to light.  Cardiovascular:     Rate and Rhythm: Normal rate and regular rhythm.     Pulses: Normal pulses.     Heart sounds: Normal heart sounds. No murmur heard. Pulmonary:      Effort: Pulmonary effort is normal. No respiratory distress.     Breath sounds: Normal breath sounds.  Abdominal:     General: Abdomen is flat.     Palpations: Abdomen is soft.     Tenderness: There is no abdominal tenderness.  Musculoskeletal:        General: No swelling.     Cervical back: Normal range of motion and neck supple.  Skin:    General: Skin is warm and dry.     Capillary Refill: Capillary refill takes less than 2 seconds.  Neurological:     General: No focal deficit present.     Mental Status: She is alert.  Psychiatric:        Mood and Affect: Mood normal.     ED Results / Procedures / Treatments   Labs (all labs ordered are listed, but only abnormal results are displayed) Labs Reviewed  CBC WITH DIFFERENTIAL/PLATELET - Abnormal; Notable for the following components:      Result Value   HCT 34.3 (*)    All other components within normal limits  COMPREHENSIVE METABOLIC PANEL - Abnormal; Notable for the following components:   Sodium 132 (*)    Glucose, Bld 107 (*)    Calcium 8.8 (*)    All other components within normal limits  BRAIN NATRIURETIC PEPTIDE - Abnormal; Notable for the following components:   B Natriuretic Peptide 132.3 (*)    All other components within normal limits  RESP PANEL BY RT-PCR (RSV, FLU A&B, COVID)  [id number]  D-DIMER, QUANTITATIVE  TROPONIN I (HIGH SENSITIVITY)  TROPONIN I (HIGH SENSITIVITY)    EKG EKG Interpretation Date/Time:  Sunday August 01 2023 09:45:29 EDT Ventricular Rate:  61 PR Interval:  145 QRS Duration:  93 QT Interval:  416 QTC Calculation: 419 R Axis:   65  Text Interpretation: Sinus rhythm Confirmed by Virgina Norfolk [phone]) on 08/01/2023 10:00:24 AM  Radiology DG Chest Portable 1 View Result Date: 08/01/2023 CLINICAL DATA:  Chest pain EXAM: PORTABLE CHEST 1 VIEW COMPARISON:  CTA chest 07/26/2022. Most recent plain films of 03/15/19 FINDINGS: Midline trachea. Normal heart size. Atherosclerosis in the  transverse aorta. No pleural effusion or pneumothorax. Diffuse peribronchial thickening. No lobar consolidation. IMPRESSION: No acute cardiopulmonary disease. Peribronchial thickening which may relate to chronic bronchitis or smoking. Aortic Atherosclerosis (ICD10-I70.0). Electronically Signed   By: Jeronimo Greaves M.D.   On: 08/01/2023 10:31    Procedures Procedures    Medications Ordered in ED Medications  acetaminophen (TYLENOL) tablet 650 mg (650 mg Oral Given 08/01/23 1129)    ED Course/ Medical Decision Making/ A&P  Medical Decision Making Amount and/or Complexity of Data Reviewed Labs: ordered. Radiology: ordered.  Risk OTC drugs.   Nelva Bush is here with chest pain.  History of hypertension high cholesterol former smoker.  Per my chart review she had a coronary CT about a year ago that showed mild nonobstructive disease between 1 to 24%.  She is having some intermittent chest discomfort this morning but cannot really tell if it is on exertion or not may be slightly worse today.  Differential diagnosis could be muscular process pneumonia fluid ACS but seems less likely to be dissection or PE.   Will get CBC CMP troponin d dimer chest x-ray EKG and reevaluate.  I had urology review her CT from couple days ago and there was no substantial change in her ascending thoracic aorta.  They did mention maybe some asymmetry and soft tissue densities and recommend the correlation with mammography.  Will make her aware of this.  D-dimer is normal.  Awaiting remaining labs including troponin.  Troponin normal.  No significant anemia electrolyte abnormality kidney injury or leukocytosis.  Chest x-ray shows no evidence of pneumonia pneumothorax.  Repeat troponin is also normal.  I do think that this could be muscular or some other stress related process.  Will have her follow-up with cardiology and primary care.  Told to return if symptoms worsen.   Discharge.  This chart was dictated using voice recognition software.  Despite best efforts to proofread,  errors can occur which can change the documentation meaning.         Final Clinical Impression(s) / ED Diagnoses Final diagnoses:  Nonspecific chest pain    Rx / DC Orders ED Discharge Orders     None         Virgina Norfolk, DO 08/01/23 1238

## 2023-08-01 NOTE — ED Triage Notes (Signed)
 C/o chest pain intermittently "all week", states it is different today, a "grabbing" pain under left chest and across sternum. Also c/o shortness of breath.

## 2023-08-02 ENCOUNTER — Ambulatory Visit: Admitting: Rehabilitative and Restorative Service Providers"

## 2023-08-02 ENCOUNTER — Encounter: Payer: Self-pay | Admitting: Rehabilitative and Restorative Service Providers"

## 2023-08-02 DIAGNOSIS — M25552 Pain in left hip: Secondary | ICD-10-CM

## 2023-08-02 DIAGNOSIS — R262 Difficulty in walking, not elsewhere classified: Secondary | ICD-10-CM | POA: Diagnosis not present

## 2023-08-02 DIAGNOSIS — M6281 Muscle weakness (generalized): Secondary | ICD-10-CM

## 2023-08-02 DIAGNOSIS — M79605 Pain in left leg: Secondary | ICD-10-CM | POA: Diagnosis not present

## 2023-08-04 ENCOUNTER — Ambulatory Visit: Payer: Medicare HMO | Admitting: Dermatology

## 2023-08-11 ENCOUNTER — Encounter: Payer: Self-pay | Admitting: Physical Therapy

## 2023-08-11 ENCOUNTER — Ambulatory Visit: Admitting: Physical Therapy

## 2023-08-11 DIAGNOSIS — M25552 Pain in left hip: Secondary | ICD-10-CM

## 2023-08-11 DIAGNOSIS — M79605 Pain in left leg: Secondary | ICD-10-CM

## 2023-08-11 DIAGNOSIS — E538 Deficiency of other specified B group vitamins: Secondary | ICD-10-CM | POA: Diagnosis not present

## 2023-08-11 DIAGNOSIS — R946 Abnormal results of thyroid function studies: Secondary | ICD-10-CM | POA: Diagnosis not present

## 2023-08-11 DIAGNOSIS — M5459 Other low back pain: Secondary | ICD-10-CM

## 2023-08-11 DIAGNOSIS — E559 Vitamin D deficiency, unspecified: Secondary | ICD-10-CM | POA: Diagnosis not present

## 2023-08-11 DIAGNOSIS — R262 Difficulty in walking, not elsewhere classified: Secondary | ICD-10-CM

## 2023-08-11 DIAGNOSIS — M6281 Muscle weakness (generalized): Secondary | ICD-10-CM | POA: Diagnosis not present

## 2023-08-11 DIAGNOSIS — Z79899 Other long term (current) drug therapy: Secondary | ICD-10-CM | POA: Diagnosis not present

## 2023-08-11 DIAGNOSIS — Z13220 Encounter for screening for lipoid disorders: Secondary | ICD-10-CM | POA: Diagnosis not present

## 2023-08-11 NOTE — Therapy (Signed)
 OUTPATIENT PHYSICAL THERAPY  EVALUATION   Patient Name: Donna [name] MRN: [id number] DOB:[birth year], 73 y.o., female Today's Date: 08/11/2023  END OF SESSION:  PT End of Session - 08/11/23 4098     Visit Number 2    Number of Visits 16    Date for PT Re-Evaluation 09/27/23    Authorization Type [employer] $10 copay    Progress Note Due on Visit 10    PT Start Time 0922    PT Stop Time 1002    PT Time Calculation (min) 40 min    Activity Tolerance Patient tolerated treatment well;No increased pain    Behavior During Therapy [facility] for tasks assessed/performed                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   chest - CX3 + 5FU   Squamous cell carcinoma of skin 09/27/2014   left thigh - tx p bx   Substance abuse (HCC)    Past Surgical History:  Procedure Laterality Date   ANKLE SURGERY     Bilateral    CATARACT EXTRACTION Bilateral    CERVICAL SPINE SURGERY  07/21/2021   LAPAROSCOPIC APPENDECTOMY N/A 05/11/2023   Procedure: APPENDECTOMY LAPAROSCOPIC;  Surgeon: Violeta Gelinas, MD;  Location: [facility] OR;  Service: General;  Laterality: N/A;   TUBAL LIGATION     Patient Active Problem List   Diagnosis Date Noted   Exposure to hepatitis B 07/01/2023   Need for immunization against viral hepatitis 07/01/2023   Screening for STDs (sexually transmitted diseases) 07/01/2023   History of substance use 07/01/2023   S/P  appendectomy 05/11/2023   S/P laparoscopic appendectomy 05/11/2023   Gasping for breath 08/19/2021   PTSD (post-traumatic stress disorder) 08/19/2021   Parasomnia overlap disorder 08/19/2021   Snoring 08/19/2021   Excessive daytime sleepiness 08/19/2021   Sleep paralysis, recurrent isolated 08/19/2021   Asthma 02/19/2021   Multiple drug allergies 02/19/2021   Gluten intolerance 02/19/2021   Chronic rhinitis 02/19/2021   Metatarsalgia of left foot 11/14/2020   Achilles tendinosis of right lower extremity 11/14/2020   Essential tremor 07/04/2019   DOE (dyspnea on exertion) 01/20/2019   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 06/13/2018   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   Superficial basal cell carcinoma 06/13/2018   Upper airway cough syndrome 06/13/2018   Atrophic vaginitis 02/17/2018   Bilateral leg cramps 02/17/2018   Fatty liver, alcoholic 04/22/2016   Depression 04/19/2016   Withdrawal complaint 04/19/2016   Hx of adenomatous colonic polyps 08/03/2015   Herpes simplex 06/15/2014   GERD (gastroesophageal reflux disease) 10/14/2013   Anxiety state 10/14/2013   Recovering alcoholic in remission (HCC) 10/14/2013   Allergic rhinitis 10/14/2013   Adjustment disorder with anxiety 10/14/2013   Elevated low-density lipoprotein level  10/14/2013   Essential hypertension 10/15/2012   Heart murmur 05/25/2012    PCP: Irena Reichmann DO  REFERRING PROVIDER: Madelyn Brunner, DO  REFERRING DIAG: [id number] (ICD-10-CM) - It band syndrome, left M25.552 (ICD-10-CM) - Pain of left hip M62.81 (ICD-10-CM) - Weakness of left quadriceps muscle  THERAPY DIAG:  Pain of left hip  Pain in left leg  Muscle weakness (generalized)  Difficulty in walking, not elsewhere classified  Other low back pain  Rationale for Evaluation and Treatment: Rehabilitation  ONSET DATE: November 19, 2022  SUBJECTIVE:   SUBJECTIVE STATEMENT: Symptoms are better;  sidelying Lt clamshells are too painful  History from previous eval on 06/22/2023: Pt indicated Dec 31st having emergency appendectomy that took her off exercise for last 6 weeks. Pt indicated chronic history of complaints related to Lt hip/thigh anterior/laterally.  Pt indicated pain was severe at times with difficulty c movement and cross legs, impacted sleeping on either side.  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  Pt indicated sitting prolonged (30 mins) with complaints in anterior thigh and noted when trying to stand.  Thought rest over last 6 weeks was helpful.  Pt indicated initial complaints were in July and felt the pain but unsure what did. Pt indicated no numbness/tingling.    Had trouble with leg press in past.    Pt indicated seeing PT prior to appendectomy.  PERTINENT HISTORY: Med Hx: Anxiety, history of carcinoma, asthma, Depression, GERD, Hyperlipidemia, IBS, substance abuse per chart review.   Pt reported some cognitive decline by per patient.   PAIN:  NPRS scale: 1-2/10 currently, 2-3/10 at worst  Pain location: Lt hip/thigh Pain description: "grabbing" Aggravating factors: sitting prolonged, squatting, SLS Relieving factors: walking, rest, shockwave therapy (maybe 2x)  PRECAUTIONS: None  WEIGHT BEARING RESTRICTIONS: No  FALLS:  Has patient fallen in last 6 months? No  LIVING ENVIRONMENT: Lives in: House/apartment Stairs: not at home   OCCUPATION: Yoga instructor  PLOF: Independent, walking, hiking, yoga, biking.  Has gym  PATIENT GOALS: Reduce pain, get back to activity, get stronger.    OBJECTIVE:   PATIENT SURVEYS:  Patient-Specific Activity Scoring Scheme  "0" represents "unable to perform." "10" represents "able to perform at prior level. 0 1 2 3 4 5 6 7 8 9  10 (Date and Score)   Activity Eval 08/02/2023    1. Yoga with bent knee poses  5    2. Sitting for a long time  3    3. Stairs 5   Score 4.33/10    Total score =  sum of the activity scores/number of activities Minimum detectable change (90%CI) for average score = 2 points Minimum detectable change (90%CI) for single activity score = 3 points   COGNITION: 08/02/2023 Overall cognitive status: WFL    SENSATION: 08/02/2023 [facility]  MUSCLE LENGTH: 08/02/2023 Passive SLR bilateral > 90 deg without pain complaints.   POSTURE:  08/02/2023 No Significant postural limitations  PALPATION: 08/02/2023 Trigger points and tenderness noted in Lt glute med/min, max, Lt lateral quad near IT band  LOWER EXTREMITY ROM:   ROM Right 08/02/2023 Left 08/02/2023  Hip flexion [facility]  Hip extension    Hip abduction    Hip adduction    Hip internal rotation 48 32  Hip external rotation 60 50  Knee flexion    Knee extension    Ankle dorsiflexion    Ankle plantarflexion    Ankle inversion    Ankle eversion     (Blank rows =  not tested)  LOWER EXTREMITY MMT:  MMT Right 08/02/2023 Left 08/02/2023  Hip flexion 5/5 4-/5; no pain  Hip extension    Hip abduction 4/5 4-/5; no pain  Hip adduction    Hip internal rotation    Hip external rotation    Knee flexion 5/5 5/5  Knee extension 5/5 5/5  Ankle dorsiflexion 5/5 5/5  Ankle plantarflexion    Ankle inversion    Ankle eversion     (Blank rows = not tested)  LOWER EXTREMITY SPECIAL TESTS:  08/02/2023 No specific testing  FUNCTIONAL TESTS:  08/02/2023 18 inch chair transfer: able to perform without UE but weight shift to Rt leg was noted.  Lt SLS: < 5 seconds with trendelenberg Rt SLS: < 10 seconds with trendelenberg  GAIT: 08/02/2023 Independent ambulation in community.                                                                                                                                                                         TODAY'S TREATMENT 08/11/23 TherEx NuStep L6 x 10 min UE/LE  TherAct Lateral lunge with TRX straps x 10 reps bil Curtsy lunge with TRX straps x10 reps bil Reverse  lunges with TRX straps x10 reps bil Decline squats 12# KB x 10 reps SLS with taps to cones; light UE support needed x 10 reps bil  Manual IASTM with percussive device to Lt lateral quad/hamstring/lateral glute    08/02/2023 Therex: Nustep lvl 5 10 mins UE/LE Supine Clamshell green tband bilateral x15 ea Bridges with hip abduction against green tband x15 ea  Hooklying Isometric Clamshell w/ 5sec hold x10 Sit to Stand x10 no UE use  Verbal review of HEP and updated HEP to address complaints noted in clam shell.   Manual Percussive device to Lt glute med/min, lateral aspect of Lt quad.   06/24/2023 Therex: Nustep lvl 5 10 mins UE/LE Clam shell  bilateral 2 x 15 Seated quad set with SLR x 10 bilaterally with slow lowering focus.  Verbal review of existing HEP.    TherActivity (to improve functional transfers, stairs) Leg press double leg 75 lbs slow lowering focus x 15  Neuro Re-ed (to improve balance contro, neuro muscular coordination/recruitment) Tandem stance 1 min x 1 bilateral on foam with slow head turns Lt and Rt in // bars with occasional HHA.  Tandem ambulation fwd/back in // bars with occasional HHA 6 ft x 5 each way Lateral stepping 3 cones x 6 each, performed bilaterally  Seated quad set 5 sec hold x 10 bilaterally   Manual Percussive device to Lt glute med/min, lateral aspect of Lt quad.     PATIENT EDUCATION:  08/02/2023 Education details: HEP, POC Person educated: Patient Education method:  Explanation, Demonstration, Verbal cues, and Handouts Education comprehension: verbalized understanding, returned demonstration, and verbal cues required  HOME EXERCISE PROGRAM: Access Code: [email or web address] URL: [email or web address] Date: 08/11/2023 Prepared by: Moshe Cipro  Exercises - Supine Figure 4 Piriformis Stretch  - 2-3 x daily - 7 x weekly - 1 sets - 3-5 reps - 15 hold - Supine Piriformis Stretch with Foot on Ground (Mirrored)  - 2-3 x daily  - 7 x weekly - 1 sets - 3-5 reps - 15 hold - Supine Bridge with Resistance Band  - 1-2 x daily - 7 x weekly - 2-3 sets - 10 reps - Sit to Stand  - 3 x daily - 7 x weekly - 1 sets - 10 reps - Hooklying Isometric Clamshell  - 1-2 x daily - 7 x weekly - 2-3 sets - 10-15 reps - Lateral Lunge with TRX  - 1 x daily - 7 x weekly - 2-3 sets - 10 reps - Curtsy Lunge with TRX  - 1 x daily - 7 x weekly - 2-3 sets - 10 reps - Assisted Lunge with TRX  - 1 x daily - 7 x weekly - 2-3 sets - 10 reps - Squat on Decline Board  - 1 x daily - 7 x weekly - 2-3 sets - 10 reps - Single Leg Balance with Clock Reach  - 1 x daily - 7 x weekly - 2-3 sets - 10 reps  ASSESSMENT:  CLINICAL IMPRESSION: Progressed strengthening exercises today with good tolerance and no increase in pain, does report muscular fatigue though.  Will continue to benefit from PT to maximize function.  OBJECTIVE IMPAIRMENTS: decreased activity tolerance, decreased balance, decreased coordination, decreased endurance, decreased mobility, difficulty walking, decreased ROM, decreased strength, increased fascial restrictions, impaired perceived functional ability, impaired flexibility, improper body mechanics, and pain.   ACTIVITY LIMITATIONS: carrying, lifting, bending, sitting, standing, squatting, sleeping, stairs, transfers, and locomotion level  PARTICIPATION LIMITATIONS: meal prep, cleaning, interpersonal relationship, driving, shopping, community activity, and occupation  PERSONAL FACTORS:  Med Hx: Anxiety, history of carcinoma, asthma, Depgression, GERD, Hyperlipidemia, IBS, substance abuse per chart review.   are also affecting patient's functional outcome.   REHAB POTENTIAL: Good  CLINICAL DECISION MAKING: Stable/uncomplicated  EVALUATION COMPLEXITY: Low   GOALS: Goals reviewed with patient? Yes  SHORT TERM GOALS: (target date for Short term goals are 3 weeks 08/23/2023)   1.  Patient will demonstrate independent use of home  exercise program to maintain progress from in clinic treatments.  Goal status: on going 06/24/2023  LONG TERM GOALS: (target dates for all long term goals are 8 weeks  09/27/2023 )   1. Patient will demonstrate/report pain at worst less than or equal to 2/10 to facilitate minimal limitation in daily activity secondary to pain symptoms.  Goal status: New   2. Patient will demonstrate independent use of home exercise program to facilitate ability to maintain/progress functional gains from skilled physical therapy services.  Goal status: New   3. Patient will demonstrate Patient specific functional scale avg > or = 7 to indicate reduced disability due to condition.  Goal status: New   4.  Patient will demonstrate bilateral  LE MMT 5/5 throughout to faciltiate usual transfers, stairs, squatting at [facility] for daily life.   Goal status: New   5.  Patient will demonstrate/report ability to return to YOGA instruction and gym workouts at [facility].  Goal status: New   6.  Patient will demonstrate bilateral SLS > 10 seconds to facilitate stability  in ambulation.  Goal status: New    PLAN:  PT FREQUENCY: 1-2x/week  PT DURATION: 8 weeks  PLANNED INTERVENTIONS: Can include 40981- PT Re-evaluation, 97110-Therapeutic exercises, 97530- Therapeutic activity, [id number]- Neuromuscular re-education, 97535- Self Care, 97140- Manual therapy, [id number]- Gait training, [phone]- Orthotic Fit/training, [phone]- Canalith repositioning, [id number]- Aquatic Therapy, 97014- Electrical stimulation (unattended), [id number]- Electrical stimulation (manual), [id number] Physical performance testing, 97016- Vasopneumatic device, [id number]- Ultrasound, [id number]- Traction (mechanical), [id number]- Ionotophoresis 4mg /ml Dexamethasone, Patient/Family education, Balance training, Stair training, Taping, Dry Needling, Joint mobilization, Joint manipulation, Spinal manipulation, Spinal mobilization, Scar mobilization, Vestibular training, Visual/preceptual  remediation/compensation, DME instructions, Cryotherapy, and Moist heat.  All performed as medically necessary.  All included unless contraindicated  PLAN FOR NEXT SESSION: review updated HEP, continue Lt LE strengthening, assess balance PRN, continue with percussive device to patient tolerance.    Clarita Crane, PT, DPT 08/11/23 10:08 AM

## 2023-08-12 ENCOUNTER — Ambulatory Visit: Admitting: Dermatology

## 2023-08-12 ENCOUNTER — Encounter: Payer: Self-pay | Admitting: Dermatology

## 2023-08-12 VITALS — BP 126/71

## 2023-08-12 DIAGNOSIS — B351 Tinea unguium: Secondary | ICD-10-CM | POA: Diagnosis not present

## 2023-08-12 DIAGNOSIS — L570 Actinic keratosis: Secondary | ICD-10-CM

## 2023-08-12 DIAGNOSIS — Z85828 Personal history of other malignant neoplasm of skin: Secondary | ICD-10-CM | POA: Diagnosis not present

## 2023-08-12 DIAGNOSIS — Z8589 Personal history of malignant neoplasm of other organs and systems: Secondary | ICD-10-CM

## 2023-08-12 MED ORDER — JUBLIA 10 % EX SOLN: 1.0000 | Freq: Every evening | CUTANEOUS | 11 refills | Status: AC

## 2023-08-12 NOTE — Patient Instructions (Addendum)

## 2023-08-12 NOTE — Progress Notes (Signed)
 Follow-Up Visit   Subjective  Donna [name] is a 73 y.o. female who presents for the following: AK follow up of left eyebrow, right forearm, right nasal sidewall, right post neck, right upper cutaneous lip treated with LN2 04/19/2023. She is also following up for onychomycosis which she has been treating for approximately 14 months. She reports using prescribed ciclopirox lacquer  for this duration. The patient notes some improvement in the appearance of her toenails, though she finds it difficult to perceive changes due to daily observation. She inquires about the possibility of an internal cause for her condition.  The patient also had two biopsies performed during a previous visit in June of last year, which were confirmed as skin cancer. These lesions were subsequently treated at [facility] by Dr. Dewaine Conger, who performed excisions.  The following portions of the chart were reviewed this encounter and updated as appropriate: medications, allergies, medical history  Review of Systems:  No other skin or systemic complaints except as noted in HPI or Assessment and Plan.  Objective  Well appearing patient in no apparent distress; mood and affect are within normal limits.   A focused examination was performed of the following areas: feet, arms   Relevant exam findings are noted in the Assessment and Plan.  Right Forearm - Posterior Erythematous thin papules/macules with gritty scale.               Assessment & Plan   HISTORY OF BASAL CELL CARCINOMA OF THE SKIN --> Left posterior leg - No evidence of recurrence today - Recommend regular full body skin exams - Recommend daily broad spectrum sunscreen SPF 30+ to sun-exposed areas, reapply every 2 hours as needed.  - Call if any new or changing lesions are noted between office visits  HISTORY OF SQUAMOUS CELL CARCINOMA OF THE SKIN  --> Right lower leg anterior - No evidence of recurrence today - No  lymphadenopathy - Recommend regular full body skin exams - Recommend daily broad spectrum sunscreen SPF 30+ to sun-exposed areas, reapply every 2 hours as needed.  - Call if any new or changing lesions are noted between office visits   ONYCHOMYCOSIS Exam: Thickened toenails with subungal debris c/w onychomycosis  - Assessment: Patient has been using antifungal medications for toenail fungus for approximately 14 months with some improvement, but progress is slow. Previous treatment with ciclopirox was ineffective. Fungal infection confirmed to be external, caused by dermatophytes, and not related to any internal factors.  - Plan:    Switch to Jublia topical antifungal medication    Apply Jublia on top and around affected nails    Use Aquaphor or Vaseline on surrounding skin to prevent irritation    Carefully trim affected nails as they grow out    Use separate nail clippers for affected and unaffected nails    Take Viviscal supplements for nail health    Add collagen powder to daily diet    Prescription to be sent to a special pharmacy for potential cost savings    Follow up in 6 months    AK (ACTINIC KERATOSIS) Right Forearm - Posterior Destruction of lesion - Right Forearm - Posterior Complexity: simple   Destruction method: cryotherapy   Informed consent: discussed and consent obtained   Timeout:  patient name, date of birth, surgical site, and procedure verified Lesion destroyed using liquid nitrogen: Yes   Region frozen until ice ball extended beyond lesion: Yes   Outcome: patient tolerated procedure well with  no complications   Post-procedure details: wound care instructions given    Return in about 6 months (around 02/11/2024).  I, Joanie Coddington, CMA, am acting as scribe for [employer], DO .   Documentation: I have reviewed the above documentation for accuracy and completeness, and I agree with the above.  Langston Reusing, DO

## 2023-08-14 DIAGNOSIS — R300 Dysuria: Secondary | ICD-10-CM | POA: Diagnosis not present

## 2023-08-14 DIAGNOSIS — R350 Frequency of micturition: Secondary | ICD-10-CM | POA: Diagnosis not present

## 2023-08-14 DIAGNOSIS — N390 Urinary tract infection, site not specified: Secondary | ICD-10-CM | POA: Diagnosis not present

## 2023-08-18 DIAGNOSIS — I2584 Coronary atherosclerosis due to calcified coronary lesion: Secondary | ICD-10-CM | POA: Diagnosis not present

## 2023-08-18 DIAGNOSIS — E538 Deficiency of other specified B group vitamins: Secondary | ICD-10-CM | POA: Diagnosis not present

## 2023-08-18 DIAGNOSIS — J45909 Unspecified asthma, uncomplicated: Secondary | ICD-10-CM | POA: Diagnosis not present

## 2023-08-18 DIAGNOSIS — G250 Essential tremor: Secondary | ICD-10-CM | POA: Diagnosis not present

## 2023-08-18 DIAGNOSIS — Z9109 Other allergy status, other than to drugs and biological substances: Secondary | ICD-10-CM | POA: Diagnosis not present

## 2023-08-18 DIAGNOSIS — Z0000 Encounter for general adult medical examination without abnormal findings: Secondary | ICD-10-CM | POA: Diagnosis not present

## 2023-08-19 ENCOUNTER — Encounter: Payer: Self-pay | Admitting: Physical Therapy

## 2023-08-19 ENCOUNTER — Ambulatory Visit: Admitting: Physical Therapy

## 2023-08-19 DIAGNOSIS — M79605 Pain in left leg: Secondary | ICD-10-CM

## 2023-08-19 DIAGNOSIS — M25552 Pain in left hip: Secondary | ICD-10-CM | POA: Diagnosis not present

## 2023-08-19 DIAGNOSIS — M6281 Muscle weakness (generalized): Secondary | ICD-10-CM

## 2023-08-19 NOTE — Therapy (Signed)
 OUTPATIENT PHYSICAL THERAPY  TREATMENT   Patient Name: Donna [name] MRN: [id number] DOB:1950/12/14, 73 y.o., female Today's Date: 08/19/2023  END OF SESSION:  PT End of Session - 08/19/23 1335     Visit Number 3    Number of Visits 16    Date for PT Re-Evaluation 09/27/23    Authorization Type [employer] $10 copay    Progress Note Due on Visit 10    PT Start Time 1340    PT Stop Time 1422    PT Time Calculation (min) 42 min    Activity Tolerance Patient tolerated treatment well;No increased pain    Behavior During Therapy [facility] for tasks assessed/performed                 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   chest - CX3 + 5FU   Squamous cell carcinoma of skin 09/27/2014   left thigh - tx p bx   Substance abuse (HCC)    Past Surgical History:  Procedure Laterality Date   ANKLE SURGERY     Bilateral    CATARACT EXTRACTION Bilateral    CERVICAL SPINE SURGERY  07/21/2021   LAPAROSCOPIC APPENDECTOMY N/A 05/11/2023   Procedure: APPENDECTOMY LAPAROSCOPIC;  Surgeon: Violeta Gelinas, MD;  Location: [facility] OR;  Service: General;  Laterality: N/A;   TUBAL LIGATION     Patient Active Problem List   Diagnosis Date Noted   Exposure to hepatitis B 07/01/2023   Need for immunization against viral hepatitis 07/01/2023   Screening for STDs (sexually transmitted diseases) 07/01/2023   History of substance use 07/01/2023   S/P  appendectomy 05/11/2023   S/P laparoscopic appendectomy 05/11/2023   Gasping for breath 08/19/2021   PTSD (post-traumatic stress disorder) 08/19/2021   Parasomnia overlap disorder 08/19/2021   Snoring 08/19/2021   Excessive daytime sleepiness 08/19/2021   Sleep paralysis, recurrent isolated 08/19/2021   Asthma 02/19/2021   Multiple drug allergies 02/19/2021   Gluten intolerance 02/19/2021   Chronic rhinitis 02/19/2021   Metatarsalgia of left foot 11/14/2020   Achilles tendinosis of right lower extremity 11/14/2020   Essential tremor 07/04/2019   DOE (dyspnea on exertion) 01/20/2019   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 06/13/2018   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   Superficial basal cell carcinoma 06/13/2018   Upper airway cough syndrome 06/13/2018   Atrophic vaginitis 02/17/2018   Bilateral leg cramps 02/17/2018   Fatty liver, alcoholic 04/22/2016   Depression 04/19/2016   Withdrawal complaint 04/19/2016   Hx of adenomatous colonic polyps 08/03/2015   Herpes simplex 06/15/2014   GERD (gastroesophageal reflux disease) 10/14/2013   Anxiety state 10/14/2013   Recovering alcoholic in remission (HCC) 10/14/2013   Allergic rhinitis 10/14/2013   Adjustment disorder with anxiety 10/14/2013   Elevated low-density lipoprotein  level 10/14/2013   Essential hypertension 10/15/2012   Heart murmur 05/25/2012    PCP: Irena Reichmann DO  REFERRING PROVIDER: Madelyn Brunner, DO  REFERRING DIAG: [phone] (ICD-10-CM) - It band syndrome, left M25.552 (ICD-10-CM) - Pain of left hip M62.81 (ICD-10-CM) - Weakness of left quadriceps muscle  THERAPY DIAG:  Pain of left hip  Pain in left leg  Muscle weakness (generalized)  Rationale for Evaluation and Treatment: Rehabilitation  ONSET DATE: November 19, 2022  SUBJECTIVE:   SUBJECTIVE STATEMENT: Doing okay; was sore after last session.  Had to do some stairs so having some lateral Lt thigh  pain. "But not bad at all."  History from previous eval on 06/22/2023: Pt indicated Dec 31st having emergency appendectomy that took her off exercise for last 6 weeks. Pt indicated chronic history of complaints related to Lt hip/thigh anterior/laterally.  Pt indicated pain was severe at times with difficulty c movement and cross legs, impacted sleeping on either side.  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  Pt indicated sitting prolonged (30 mins) with complaints in anterior thigh and noted when trying to stand.  Thought rest over last 6 weeks was helpful.  Pt indicated initial complaints were in July and felt the pain but unsure what did. Pt indicated no numbness/tingling.    Had trouble with leg press in past.    Pt indicated seeing PT prior to appendectomy.  PERTINENT HISTORY: Med Hx: Anxiety, history of carcinoma, asthma, Depression, GERD, Hyperlipidemia, IBS, substance abuse per chart review.   Pt reported some cognitive decline by per patient.   PAIN:  NPRS scale: 3/10 currently, 3/10 at worst  Pain location: Lt hip/thigh Pain description: "grabbing" Aggravating factors: sitting prolonged, squatting, SLS Relieving factors: walking, rest, shockwave therapy (maybe 2x)  PRECAUTIONS: None  WEIGHT BEARING RESTRICTIONS: No  FALLS:  Has patient fallen in last 6 months? No  LIVING ENVIRONMENT: Lives in: House/apartment Stairs: not at home   OCCUPATION: Yoga instructor  PLOF: Independent, walking, hiking, yoga, biking.  Has gym  PATIENT GOALS: Reduce pain, get back to activity, get stronger.    OBJECTIVE:   PATIENT SURVEYS:  Patient-Specific Activity Scoring Scheme  "0" represents "unable to perform." "10" represents "able to perform at prior level. 0 1 2 3 4 5 6 7 8 9  10 (Date and Score)   Activity Eval 08/02/2023    1. Yoga with bent knee poses  5    2. Sitting for a long time  3    3. Stairs 5   Score 4.33/10    Total score = sum of the  activity scores/number of activities Minimum detectable change (90%CI) for average score = 2 points Minimum detectable change (90%CI) for single activity score = 3 points   COGNITION: 08/02/2023 Overall cognitive status: WFL    SENSATION: 08/02/2023 [facility]  MUSCLE LENGTH: 08/02/2023 Passive SLR bilateral > 90 deg without pain complaints.   POSTURE:  08/02/2023 No Significant postural limitations  PALPATION: 08/02/2023 Trigger points and tenderness noted in Lt glute med/min, max, Lt lateral quad near IT band  LOWER EXTREMITY ROM:   ROM Right 08/02/2023 Left 08/02/2023  Hip flexion [facility]  Hip extension    Hip abduction    Hip adduction    Hip internal rotation 48 32  Hip external rotation 60 50  Knee flexion    Knee extension    Ankle dorsiflexion    Ankle plantarflexion    Ankle inversion    Ankle eversion     (  Blank rows = not tested)  LOWER EXTREMITY MMT:  MMT Right 08/02/2023 Left 08/02/2023  Hip flexion 5/5 4-/5; no pain  Hip extension    Hip abduction 4/5 4-/5; no pain  Hip adduction    Hip internal rotation    Hip external rotation    Knee flexion 5/5 5/5  Knee extension 5/5 5/5  Ankle dorsiflexion 5/5 5/5  Ankle plantarflexion    Ankle inversion    Ankle eversion     (Blank rows = not tested)  LOWER EXTREMITY SPECIAL TESTS:  08/02/2023 No specific testing  FUNCTIONAL TESTS:  08/02/2023 18 inch chair transfer: able to perform without UE but weight shift to Rt leg was noted.  Lt SLS: < 5 seconds with trendelenberg Rt SLS: < 10 seconds with trendelenberg  GAIT: 08/02/2023 Independent ambulation in community.                                                                                                                                                                         TODAY'S TREATMENT 08/19/23 TherEx NuStep L6 x 10 min UE/LE  TherAct TRX squats 2x10 TRX lateral lunges 2x10 TRX deadlift "good mornings" 2x10 TRX curtsy lunges 2x10 LLE  SLS with taps to Blaze Pods x 5 cycles:30 sec on/30 sec off; intermittent UE support LLE on 4" step with forward heel taps 2x10; intermittent UE support   08/11/23 TherEx NuStep L6 x 10 min UE/LE  TherAct Lateral lunge with TRX straps x 10 reps bil Curtsy lunge with TRX straps x10 reps bil Reverse lunges with TRX straps x10 reps bil Decline squats 12# KB x 10 reps SLS with taps to cones; light UE support needed x 10 reps bil  Manual IASTM with percussive device to Lt lateral quad/hamstring/lateral glute    08/02/2023 Therex: Nustep lvl 5 10 mins UE/LE Supine Clamshell green tband bilateral x15 ea Bridges with hip abduction against green tband x15 ea  Hooklying Isometric Clamshell w/ 5sec hold x10 Sit to Stand x10 no UE use  Verbal review of HEP and updated HEP to address complaints noted in clam shell.   Manual Percussive device to Lt glute med/min, lateral aspect of Lt quad.   06/24/2023 Therex: Nustep lvl 5 10 mins UE/LE Clam shell  bilateral 2 x 15 Seated quad set with SLR x 10 bilaterally with slow lowering focus.  Verbal review of existing HEP.    TherActivity (to improve functional transfers, stairs) Leg press double leg 75 lbs slow lowering focus x 15  Neuro Re-ed (to improve balance contro, neuro muscular coordination/recruitment) Tandem stance 1 min x 1 bilateral on foam with slow head turns Lt and Rt in // bars with occasional HHA.  Tandem ambulation fwd/back in //  bars with occasional HHA 6 ft x 5 each way Lateral stepping 3 cones x 6 each, performed bilaterally  Seated quad set 5 sec hold x 10 bilaterally   Manual Percussive device to Lt glute med/min, lateral aspect of Lt quad.     PATIENT EDUCATION:  08/02/2023 Education details: HEP, POC Person educated: Patient Education method: Explanation, Demonstration, Verbal cues, and Handouts Education comprehension: verbalized understanding, returned demonstration, and verbal cues required  HOME EXERCISE  PROGRAM: Access Code: [email or web address] URL: [email or web address] Date: 08/11/2023 Prepared by: Moshe Cipro  Exercises - Supine Figure 4 Piriformis Stretch  - 2-3 x daily - 7 x weekly - 1 sets - 3-5 reps - 15 hold - Supine Piriformis Stretch with Foot on Ground (Mirrored)  - 2-3 x daily - 7 x weekly - 1 sets - 3-5 reps - 15 hold - Supine Bridge with Resistance Band  - 1-2 x daily - 7 x weekly - 2-3 sets - 10 reps - Sit to Stand  - 3 x daily - 7 x weekly - 1 sets - 10 reps - Hooklying Isometric Clamshell  - 1-2 x daily - 7 x weekly - 2-3 sets - 10-15 reps - Lateral Lunge with TRX  - 1 x daily - 7 x weekly - 2-3 sets - 10 reps - Curtsy Lunge with TRX  - 1 x daily - 7 x weekly - 2-3 sets - 10 reps - Assisted Lunge with TRX  - 1 x daily - 7 x weekly - 2-3 sets - 10 reps - Squat on Decline Board  - 1 x daily - 7 x weekly - 2-3 sets - 10 reps - Single Leg Balance with Clock Reach  - 1 x daily - 7 x weekly - 2-3 sets - 10 reps  ASSESSMENT:  CLINICAL IMPRESSION: Able to progress TRX exercises today with good tolerance.  No increase in pain reported but does have continued difficulty with functional stepping activities.  Will continue to benefit from PT to maximize function.   OBJECTIVE IMPAIRMENTS: decreased activity tolerance, decreased balance, decreased coordination, decreased endurance, decreased mobility, difficulty walking, decreased ROM, decreased strength, increased fascial restrictions, impaired perceived functional ability, impaired flexibility, improper body mechanics, and pain.   ACTIVITY LIMITATIONS: carrying, lifting, bending, sitting, standing, squatting, sleeping, stairs, transfers, and locomotion level  PARTICIPATION LIMITATIONS: meal prep, cleaning, interpersonal relationship, driving, shopping, community activity, and occupation  PERSONAL FACTORS:  Med Hx: Anxiety, history of carcinoma, asthma, Depgression, GERD, Hyperlipidemia, IBS, substance abuse per chart  review.   are also affecting patient's functional outcome.   REHAB POTENTIAL: Good  CLINICAL DECISION MAKING: Stable/uncomplicated  EVALUATION COMPLEXITY: Low   GOALS: Goals reviewed with patient? Yes  SHORT TERM GOALS: (target date for Short term goals are 3 weeks 08/23/2023)   1.  Patient will demonstrate independent use of home exercise program to maintain progress from in clinic treatments. Goal status: on going 06/24/2023  LONG TERM GOALS: (target dates for all long term goals are 8 weeks  09/27/2023 )   1. Patient will demonstrate/report pain at worst less than or equal to 2/10 to facilitate minimal limitation in daily activity secondary to pain symptoms. Goal status: New   2. Patient will demonstrate independent use of home exercise program to facilitate ability to maintain/progress functional gains from skilled physical therapy services. Goal status: New   3. Patient will demonstrate Patient specific functional scale avg > or = 7 to indicate reduced disability due to condition.  Goal status: New  4.  Patient will demonstrate bilateral  LE MMT 5/5 throughout to faciltiate usual transfers, stairs, squatting at [facility] for daily life.  Goal status: New   5.  Patient will demonstrate/report ability to return to YOGA instruction and gym workouts at [facility].  Goal status: New   6.  Patient will demonstrate bilateral SLS > 10 seconds to facilitate stability in ambulation.  Goal status: New    PLAN:  PT FREQUENCY: 1-2x/week  PT DURATION: 8 weeks  PLANNED INTERVENTIONS: Can include 69629- PT Re-evaluation, 97110-Therapeutic exercises, 97530- Therapeutic activity, [id number]- Neuromuscular re-education, 97535- Self Care, 97140- Manual therapy, [phone]- Gait training, [phone]- Orthotic Fit/training, [phone]- Canalith repositioning, [id number]- Aquatic Therapy, 97014- Electrical stimulation (unattended), [id number]- Electrical stimulation (manual), [id number] Physical performance testing, 97016- Vasopneumatic  device, [id number]- Ultrasound, [id number]- Traction (mechanical), [id number]- Ionotophoresis 4mg /ml Dexamethasone, Patient/Family education, Balance training, Stair training, Taping, Dry Needling, Joint mobilization, Joint manipulation, Spinal manipulation, Spinal mobilization, Scar mobilization, Vestibular training, Visual/preceptual remediation/compensation, DME instructions, Cryotherapy, and Moist heat.  All performed as medically necessary.  All included unless contraindicated  PLAN FOR NEXT SESSION: continue Lt LE strengthening, assess balance PRN, continue with percussive device to patient tolerance.    Clarita Crane, PT, DPT 08/19/23 2:30 PM

## 2023-08-21 ENCOUNTER — Ambulatory Visit
Admission: EM | Admit: 2023-08-21 | Discharge: 2023-08-21 | Disposition: A | Discharge status: Home / Self Care | Attending: Family Medicine | Admitting: Family Medicine

## 2023-08-21 ENCOUNTER — Other Ambulatory Visit: Payer: Self-pay

## 2023-08-21 ENCOUNTER — Ambulatory Visit (HOSPITAL_BASED_OUTPATIENT_CLINIC_OR_DEPARTMENT_OTHER)
Admission: RE | Admit: 2023-08-21 | Discharge: 2023-08-21 | Disposition: A | Discharge status: Home / Self Care | Source: Ambulatory Visit | Attending: Family Medicine | Admitting: Family Medicine

## 2023-08-21 DIAGNOSIS — R051 Acute cough: Secondary | ICD-10-CM | POA: Diagnosis not present

## 2023-08-21 DIAGNOSIS — R350 Frequency of micturition: Secondary | ICD-10-CM | POA: Insufficient documentation

## 2023-08-21 DIAGNOSIS — R059 Cough, unspecified: Secondary | ICD-10-CM | POA: Diagnosis not present

## 2023-08-21 DIAGNOSIS — Z2089 Contact with and (suspected) exposure to other communicable diseases: Secondary | ICD-10-CM | POA: Diagnosis not present

## 2023-08-21 DIAGNOSIS — R0600 Dyspnea, unspecified: Secondary | ICD-10-CM | POA: Diagnosis not present

## 2023-08-21 LAB — POCT URINALYSIS DIP (MANUAL ENTRY)
Bilirubin, UA: NEGATIVE
Glucose, UA: NEGATIVE mg/dL
Ketones, POC UA: NEGATIVE mg/dL
Leukocytes, UA: NEGATIVE
Nitrite, UA: NEGATIVE
Protein Ur, POC: NEGATIVE mg/dL
Spec Grav, UA: 1.01
Urobilinogen, UA: 0.2 U/dL
pH, UA: 6.5

## 2023-08-21 LAB — POC RSV: RSV Antigen, POC: NEGATIVE

## 2023-08-21 MED ORDER — CEFUROXIME AXETIL 250 MG PO TABS: 250.0000 mg | ORAL_TABLET | Freq: Two times a day (BID) | ORAL | 0 refills | Status: DC

## 2023-08-21 MED ORDER — BENZONATATE 100 MG PO CAPS: 100.0000 mg | ORAL_CAPSULE | Freq: Three times a day (TID) | ORAL | 0 refills | Status: DC | PRN

## 2023-08-21 MED ORDER — AZITHROMYCIN 250 MG PO TABS: ORAL_TABLET | ORAL | 0 refills | Status: DC

## 2023-08-21 NOTE — ED Provider Notes (Signed)
 [facility]    CSN: [id number] Arrival date & time: 08/21/23  1026      History   Chief Complaint Chief Complaint  Patient presents with   Cough   Nausea    HPI Donna [name] is a 73 y.o. female.    Cough  Here for cough productive of mucus, fatigue and nausea.  This has been going on since April 7.  No fever or chills.  No vomiting or diarrhea.  She has felt short of breath some.  She did not at home COVID and flu test that was -2 days ago.  She was exposed to pneumonia about April 4 or 5.  Cipro causes neuropathy symptoms, doxycycline causes nausea, penicillins cause nausea and vomiting and breaking out, all forms of steroids are not tolerated  She was also treated UTI when she was out of town last week.  She had frequency and dysuria and malodorous urine.  She was treated with nitrofurantoin, and she did receive a call that the urine culture showed that the E. coli causing the infection was sensitive to the nitrofurantoin.  She finished the nitrofurantoin last night.  In the last 2 or 3 days she has begun to have urinary frequency again and her urine smells.  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   chest - CX3 + 5FU   Squamous cell carcinoma of skin 09/27/2014   left thigh - tx p bx   Substance abuse (HCC)     Patient Active Problem List   Diagnosis Date Noted   Exposure to hepatitis B  07/01/2023   Need for immunization against viral hepatitis 07/01/2023   Screening for STDs (sexually transmitted diseases) 07/01/2023   History of substance use 07/01/2023   S/P appendectomy 05/11/2023   S/P laparoscopic appendectomy 05/11/2023   Gasping for breath 08/19/2021   PTSD (post-traumatic stress disorder) 08/19/2021   Parasomnia overlap disorder 08/19/2021   Snoring 08/19/2021   Excessive daytime sleepiness 08/19/2021   Sleep paralysis, recurrent isolated 08/19/2021   Asthma 02/19/2021   Multiple drug allergies 02/19/2021   Gluten intolerance 02/19/2021   Chronic rhinitis 02/19/2021   Metatarsalgia of left foot 11/14/2020   Achilles tendinosis of right lower extremity 11/14/2020   Essential tremor 07/04/2019   DOE (dyspnea on exertion) 01/20/2019   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 06/13/2018   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   Superficial basal cell carcinoma 06/13/2018   Upper airway cough syndrome 06/13/2018   Atrophic vaginitis 02/17/2018   Bilateral leg cramps 02/17/2018   Fatty liver, alcoholic 04/22/2016   Depression 04/19/2016   Withdrawal complaint 04/19/2016   Hx of adenomatous colonic polyps 08/03/2015   Herpes simplex 06/15/2014   GERD (gastroesophageal reflux disease) 10/14/2013  Anxiety state 10/14/2013   Recovering alcoholic in remission (HCC) 10/14/2013   Allergic rhinitis 10/14/2013   Adjustment disorder with anxiety 10/14/2013   Elevated low-density lipoprotein level 10/14/2013   Essential hypertension 10/15/2012   Heart murmur 05/25/2012    Past Surgical History:  Procedure Laterality Date   ANKLE SURGERY     Bilateral    CATARACT EXTRACTION Bilateral    CERVICAL SPINE SURGERY  07/21/2021   LAPAROSCOPIC APPENDECTOMY N/A 05/11/2023   Procedure: APPENDECTOMY LAPAROSCOPIC;  Surgeon: Dorena Gander, MD;  Location: [facility] OR;  Service: General;  Laterality: N/A;   TUBAL LIGATION      OB  History   No obstetric history on file.      Home Medications    Prior to Admission medications   Medication Sig Start Date End Date Taking? Authorizing Provider  azithromycin (ZITHROMAX) 250 MG tablet Take first 2 tablets together, then 1 every day until finished. 08/21/23  Yes Nikkita Adeyemi K, MD  benzonatate (TESSALON) 100 MG capsule Take 1 capsule (100 mg total) by mouth 3 (three) times daily as needed for cough. 08/21/23  Yes Eli Adami K, MD  cefUROXime (CEFTIN) 250 MG tablet Take 1 tablet (250 mg total) by mouth 2 (two) times daily with a meal. 08/21/23  Yes Avelyn Touch, Paige Boatman, MD  albuterol (VENTOLIN HFA) 108 (90 Base) MCG/ACT inhaler Inhale 2 puffs into the lungs every 4 (four) hours as needed. 08/04/21   [provider]  b complex vitamins tablet Take 1 tablet by mouth daily.      [provider]  budesonide-formoterol (SYMBICORT) 80-4.5 MCG/ACT inhaler TAKE 2 PUFFS BY MOUTH EVERY 6 HOURS AS NEEDED 06/28/23   Quillian Brunt, MD  calcium gluconate 500 MG tablet Take 500 mg by mouth daily.    [provider]  ciclopirox (PENLAC) 8 % solution Apply 1 Application topically daily. For the toenail 05/01/23   [provider]  diclofenac Sodium (VOLTAREN) 1 % GEL Apply 2 g topically daily as needed.    [provider]  Efinaconazole (JUBLIA) 10 % SOLN Apply 1 application  topically at bedtime. 08/12/23   Dellar Fenton, DO  Estradiol (YUVAFEM) 10 MCG TABS vaginal tablet Place 1 tablet vaginally once a week. On Sundays    [provider]  lisinopril (ZESTRIL) 5 MG tablet TAKE 1 TABLET BY MOUTH DAILY FOR ELEVATED BP WHILE TRAVELING 11/05/19   Sylvan Evener, MD  loratadine (CLARITIN) 10 MG tablet Take 10 mg by mouth daily as needed (allergy season).    [provider]  Magnesium 500 MG TABS Take 1 tablet by mouth daily.      [provider]  metoprolol tartrate (LOPRESSOR) 25 MG tablet Take 0.5 tablets (12.5 mg  total) by mouth every 12 (twelve) hours as needed (for palpitations or increased HR > 100). 06/10/23   Swinyer, Leilani Punter, NP  montelukast (SINGULAIR) 10 MG tablet Take 1 tablet (10 mg total) by mouth at bedtime. 07/27/23   Quillian Brunt, MD  Multiple Vitamin (MULTIVITAMIN) capsule Take 1 capsule by mouth daily.      [provider]  propranolol (INDERAL) 10 MG tablet Take 10 mg by mouth as needed. 12/02/20   [provider]  valACYclovir (VALTREX) 500 MG tablet ONE BY MOUTH TWICE A DAY X 5 DAYS FOR HERPES OUTBREAK 11/25/19   Sylvan Evener, MD    Family History Family History  Problem Relation Age of Onset   Dementia Mother  Diabetes Mother    Melanoma Father    Colon cancer Father 61       Survived   Heart disease Father    Heart attack Father    Rheum arthritis Father    Dementia Father    Healthy Sister    Healthy Brother    Dementia Maternal Aunt    Dementia Maternal Uncle    Colon polyps Paternal Aunt    Pancreatic cancer Paternal Aunt    Rectal cancer Paternal Aunt    Colon cancer Paternal Aunt    Colon cancer Paternal Grandmother    Breast cancer Maternal Grandmother    Stomach cancer Neg Hx    Esophageal cancer Neg Hx    Liver cancer Neg Hx     Social History Social History   Tobacco Use   Smoking status: Former    Current packs/day: 0.00    Types: Cigarettes    Start date: 10/16/1966    Quit date: 10/15/1996    Years since quitting: 26.8   Smokeless tobacco: Never  Vaping Use   Vaping status: Never Used  Substance Use Topics   Alcohol use: No   Drug use: No     Allergies   Ciprofloxacin, Corticosteroids, Doxycycline, Gluten meal, Incruse ellipta [umeclidinium bromide], Montelukast, Penicillins, Prednisone, Quinolones, and Statins   Review of Systems Review of Systems  Respiratory:  Positive for cough.      Physical Exam Triage Vital Signs ED Triage Vitals  Encounter Vitals Group     BP 08/21/23 1041 (!) 148/82     Systolic  BP Percentile --      Diastolic BP Percentile --      Pulse Rate 08/21/23 1041 69     Resp 08/21/23 1041 18     Temp 08/21/23 1041 98.8 F (37.1 C)     Temp Source 08/21/23 1041 Oral     SpO2 08/21/23 1041 97 %     Weight 08/21/23 1042 155 lb (70.3 kg)     Height 08/21/23 1042 5\' 7"  (1.702 m)     Head Circumference --      Peak Flow --      Pain Score 08/21/23 1042 0     Pain Loc --      Pain Education --      Exclude from Growth Chart --    No data found.  Updated Vital Signs BP (!) 148/82 (BP Location: Right Arm)   Pulse 69   Temp 98.8 F (37.1 C) (Oral)   Resp 18   Ht 5\' 7"  (1.702 m)   Wt 70.3 kg   SpO2 97%   BMI 24.28 kg/m   Visual Acuity Right Eye Distance:   Left Eye Distance:   Bilateral Distance:    Right Eye Near:   Left Eye Near:    Bilateral Near:     Physical Exam Vitals reviewed.  Constitutional:      General: She is not in acute distress.    Appearance: She is not toxic-appearing.  HENT:     Nose: Congestion present.     Mouth/Throat:     Mouth: Mucous membranes are moist.     Pharynx: No oropharyngeal exudate or posterior oropharyngeal erythema.  Eyes:     Extraocular Movements: Extraocular movements intact.     Conjunctiva/sclera: Conjunctivae normal.     Pupils: Pupils are equal, round, and reactive to light.  Cardiovascular:     Rate and Rhythm: Normal rate and regular rhythm.  Heart sounds: No murmur heard. Pulmonary:     Effort: No respiratory distress.     Breath sounds: No stridor. No wheezing, rhonchi or rales.     Comments: Breath sounds are distant.  No rhonchi or rales or wheezes heard. Musculoskeletal:     Cervical back: Neck supple.  Lymphadenopathy:     Cervical: No cervical adenopathy.  Skin:    Capillary Refill: Capillary refill takes less than 2 seconds.     Coloration: Skin is not jaundiced or pale.  Neurological:     General: No focal deficit present.     Mental Status: She is alert and oriented to person,  place, and time.  Psychiatric:        Behavior: Behavior normal.      UC Treatments / Results  Labs (all labs ordered are listed, but only abnormal results are displayed) Labs Reviewed  POCT URINALYSIS DIP (MANUAL ENTRY) - Abnormal; Notable for the following components:      Result Value   Blood, UA trace-intact (*)    All other components within normal limits  POC RSV - Normal  URINE CULTURE    EKG   Radiology No results found.  Procedures Procedures (including critical care time)  Medications Ordered in UC Medications - No data to display  Initial Impression / Assessment and Plan / UC Course  I have reviewed the triage vital signs and the nursing notes.  Pertinent labs & imaging results that were available during my care of the patient were reviewed by me and considered in my medical decision making (see chart for details).     Urine has just a trace of RBCs.  No leuks or nitrites.  RSV is negative, done because she was concerned about being contagious. Chest x-ray is ordered.  We do not have an x-ray tech in this facility today, so she will go get it done on outpatient basis, possibly at the [facility].  We will notify her if it looks like there is pneumonia.  Due to the concern of pneumonia I have sent in prescriptions for Ceftin and for a Z-Pak to treat pneumonia adequately.  Urine culture is sent, and it is my hope that the Ceftin would adequately cover the UTI also, if there is 1.  I discussed with her that have not had antibiotics in the last 24 hours could confound the urine culture.  Final Clinical Impressions(s) / UC Diagnoses   Final diagnoses:  Urinary frequency  Acute cough  Exposure to pneumonia     Discharge Instructions      The urinalysis shows only a trace of red blood cells.  Urine culture is sent and staff will notify you if it does show urinary tract infection.  The RSV test was negative  Azithromycin 250 mg--take 2  orally the first day, then 1 daily for 4 more days.  This antibiotic treats pneumonia, including atypical pneumonias.  Cefuroxime 250 mg--1 tablet 2 times daily for 7 days.  This antibiotic would be an adequate treatment usually for a urinary tract infection and for pneumonia.   Please go to the [facility] health med Center in [facility] at [phone] will end Dari round to have your x-ray done on an outpatient basis.  We will call you if it looks like there is pneumonia, when there is a radiology report available.     ED Prescriptions     Medication Sig Dispense Auth. Provider   benzonatate (TESSALON) 100 MG capsule  Take 1 capsule (100 mg total) by mouth 3 (three) times daily as needed for cough. 21 capsule Hadley Detloff K, MD   azithromycin (ZITHROMAX) 250 MG tablet Take first 2 tablets together, then 1 every day until finished. 6 tablet Amrie Gurganus K, MD   cefUROXime (CEFTIN) 250 MG tablet Take 1 tablet (250 mg total) by mouth 2 (two) times daily with a meal. 14 tablet Michela Herst K, MD      PDMP not reviewed this encounter.   Ann Keto, MD 08/21/23 1136

## 2023-08-21 NOTE — ED Triage Notes (Addendum)
 Pt presents with complaints of cough, fatigue, and little nausea x 5 days. Denies fevers at home. At-home COVID + Flu test taken at home two days ago, both were negative. Steroid inhaler used with no improvement. Pt states she is feeling a little SOB, exposed to pneumonia last week by family member.   Pt is also mentioning urinary frequency with unusual odor. Was seen previously for this and prescribed 7 days of antibiotics. Last dose taken last night 4/11. Symptoms are still ongoing.

## 2023-08-21 NOTE — Discharge Instructions (Addendum)
 The urinalysis shows only a trace of red blood cells.  Urine culture is sent and staff will notify you if it does show urinary tract infection.  The RSV test was negative  Azithromycin 250 mg--take 2 orally the first day, then 1 daily for 4 more days.  This antibiotic treats pneumonia, including atypical pneumonias.  Cefuroxime 250 mg--1 tablet 2 times daily for 7 days.  This antibiotic would be an adequate treatment usually for a urinary tract infection and for pneumonia.   Please go to the [facility] in [facility] at [phone] will end Dari round to have your x-ray done on an outpatient basis.  We will call you if it looks like there is pneumonia, when there is a radiology report available.

## 2023-08-23 ENCOUNTER — Ambulatory Visit: Admitting: Rehabilitative and Restorative Service Providers"

## 2023-08-23 ENCOUNTER — Encounter: Payer: Self-pay | Admitting: Rehabilitative and Restorative Service Providers"

## 2023-08-23 DIAGNOSIS — M79605 Pain in left leg: Secondary | ICD-10-CM

## 2023-08-23 DIAGNOSIS — M25552 Pain in left hip: Secondary | ICD-10-CM | POA: Diagnosis not present

## 2023-08-23 DIAGNOSIS — M6281 Muscle weakness (generalized): Secondary | ICD-10-CM

## 2023-08-23 LAB — URINE CULTURE: Culture: 20000 — AB

## 2023-08-23 NOTE — Therapy (Signed)
 OUTPATIENT PHYSICAL THERAPY  TREATMENT / DISCHARGE   Patient Name: Donna [name] MRN: [id number] DOB:[birth year], 73 y.o., female Today's Date: 08/23/2023  PHYSICAL THERAPY DISCHARGE SUMMARY  Visits from Start of Care: 4  Current functional level related to goals / functional outcomes: See note   Remaining deficits: See note   Education / Equipment: HEP  Patient goals were met. Patient is being discharged due to being pleased with the current functional level.   END OF SESSION:  PT End of Session - 08/23/23 0921     Visit Number 4    Number of Visits 16    Date for PT Re-Evaluation 09/27/23    Authorization Type [employer] $10 copay    Progress Note Due on Visit 10    PT Start Time 0842    PT Stop Time 0922    PT Time Calculation (min) 40 min    Activity Tolerance Patient tolerated treatment well    Behavior During Therapy [facility] for tasks assessed/performed                  Past Medical History:  Diagnosis Date   Anxiety    Asthma    Basal cell carcinoma 06/29/2017   right lip - CX3 + 5FU   BCC (basal cell carcinoma of skin) 10/29/2022   left lower leg, posterior - Mohs 01/27/2023   Cataract    bilateral lens implants   Cough variant asthma    Depression    GERD (gastroesophageal reflux disease)    Heart murmur    Hiatal hernia    Hyperlipidemia    IBS (irritable bowel syndrome)    SCC (squamous cell carcinoma) 10/29/2022   right lower leg, anterior - Mohs 12/28/2022 Dr Gaylyn Keas   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   chest - CX3 + 5FU   Squamous cell carcinoma of skin 09/27/2014   left thigh - tx p bx   Substance abuse (HCC)    Past Surgical History:  Procedure Laterality Date   ANKLE SURGERY     Bilateral    CATARACT EXTRACTION Bilateral    CERVICAL SPINE SURGERY  07/21/2021   LAPAROSCOPIC APPENDECTOMY N/A 05/11/2023   Procedure: APPENDECTOMY LAPAROSCOPIC;  Surgeon: Dorena Gander, MD;  Location: [facility] OR;   Service: General;  Laterality: N/A;   TUBAL LIGATION     Patient Active Problem List   Diagnosis Date Noted   Exposure to hepatitis B 07/01/2023   Need for immunization against viral hepatitis 07/01/2023   Screening for STDs (sexually transmitted diseases) 07/01/2023   History of substance use 07/01/2023   S/P appendectomy 05/11/2023   S/P laparoscopic appendectomy 05/11/2023   Gasping for breath 08/19/2021   PTSD (post-traumatic stress disorder) 08/19/2021   Parasomnia overlap disorder 08/19/2021   Snoring 08/19/2021   Excessive daytime sleepiness 08/19/2021   Sleep paralysis, recurrent isolated 08/19/2021   Asthma 02/19/2021   Multiple drug allergies 02/19/2021   Gluten intolerance 02/19/2021   Chronic rhinitis 02/19/2021   Metatarsalgia of left foot 11/14/2020   Achilles tendinosis of right lower extremity 11/14/2020   Essential tremor 07/04/2019   DOE (dyspnea on exertion) 01/20/2019   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 06/13/2018   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   Superficial basal cell carcinoma 06/13/2018   Upper airway cough syndrome 06/13/2018   Atrophic vaginitis 02/17/2018   Bilateral leg cramps 02/17/2018   Fatty liver, alcoholic 04/22/2016   Depression  04/19/2016   Withdrawal complaint 04/19/2016   Hx of adenomatous colonic polyps 08/03/2015   Herpes simplex 06/15/2014   GERD (gastroesophageal reflux disease) 10/14/2013   Anxiety state 10/14/2013   Recovering alcoholic in remission (HCC) 10/14/2013   Allergic rhinitis 10/14/2013   Adjustment disorder with anxiety 10/14/2013   Elevated low-density lipoprotein level 10/14/2013   Essential hypertension 10/15/2012   Heart murmur 05/25/2012    PCP: Irena Reichmann DO  REFERRING PROVIDER: Madelyn Brunner, DO  REFERRING DIAG: [id number] (ICD-10-CM) - It band syndrome, left M25.552 (ICD-10-CM) - Pain of left hip M62.81 (ICD-10-CM) - Weakness of left quadriceps  muscle  THERAPY DIAG:  Pain of left hip  Pain in left leg  Muscle weakness (generalized)  Rationale for Evaluation and Treatment: Rehabilitation  ONSET DATE: November 19, 2022  SUBJECTIVE:   SUBJECTIVE STATEMENT: Doing okay; was sore after last session.  Had to do some stairs so having some lateral Lt thigh pain. "But not bad at all."  History from previous eval on 06/22/2023: Pt indicated Dec 31st having emergency appendectomy that took her off exercise for last 6 weeks. Pt indicated chronic history of complaints related to Lt hip/thigh anterior/laterally.  Pt indicated pain was severe at times with difficulty c movement and cross legs, impacted sleeping on either side.  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  Pt indicated sitting prolonged (30 mins) with complaints in anterior thigh and noted when trying to stand.  Thought rest over last 6 weeks was helpful.  Pt indicated initial complaints were in July and felt the pain but unsure what did. Pt indicated no numbness/tingling.    Had trouble with leg press in past.    Pt indicated seeing PT prior to appendectomy.  PERTINENT HISTORY: Med Hx: Anxiety, history of carcinoma, asthma, Depression, GERD, Hyperlipidemia, IBS, substance abuse per chart review.   Pt reported some cognitive decline by per patient.   PAIN:  NPRS scale: 3/10 currently, 3/10 at worst  Pain location: Lt hip/thigh Pain description: "grabbing" Aggravating factors: sitting prolonged, squatting, SLS Relieving factors: walking, rest, shockwave therapy (maybe 2x)  PRECAUTIONS: None  WEIGHT BEARING RESTRICTIONS: No  FALLS:  Has patient fallen in last 6 months? No  LIVING ENVIRONMENT: Lives in: House/apartment Stairs: not at home   OCCUPATION: Yoga instructor  PLOF: Independent, walking, hiking, yoga, biking.  Has gym  PATIENT GOALS: Reduce pain, get back to activity, get stronger.    OBJECTIVE:   PATIENT SURVEYS:  Patient-Specific  Activity Scoring Scheme  "0" represents "unable to perform." "10" represents "able to perform at prior level. 0 1 2 3 4 5 6 7 8 9  10 (Date and Score)   Activity Eval 08/02/2023  08/23/2023   1. Yoga with bent knee poses  5  8  2. Sitting for a long time  3  8  3. Stairs 5 10  Score 4.33/10 8.667   Total score = sum of the activity scores/number of activities Minimum detectable change (90%CI) for average score = 2 points Minimum detectable change (90%CI) for single activity score = 3 points   COGNITION: 08/02/2023 Overall cognitive status: WFL    SENSATION: 08/02/2023 [facility]  MUSCLE LENGTH: 08/02/2023 Passive SLR bilateral > 90 deg without pain complaints.   POSTURE:  08/02/2023 No Significant postural limitations  PALPATION: 08/02/2023 Trigger points and tenderness noted in Lt glute med/min, max, Lt lateral quad near IT band  LOWER EXTREMITY ROM:   ROM Right 08/02/2023 Left 08/02/2023  Hip flexion [facility]  Hip extension    Hip abduction    Hip adduction    Hip internal rotation 48 32  Hip external rotation 60 50  Knee flexion    Knee extension    Ankle dorsiflexion    Ankle plantarflexion    Ankle inversion    Ankle eversion     (Blank rows = not tested)  LOWER EXTREMITY MMT:  MMT Right 08/02/2023 Left 08/02/2023 Left 08/23/2023  Hip flexion 5/5 4-/5; no pain 5/5  Hip extension     Hip abduction 4/5 4-/5; no pain 4/5  Hip adduction     Hip internal rotation     Hip external rotation     Knee flexion 5/5 5/5   Knee extension 5/5 5/5   Ankle dorsiflexion 5/5 5/5   Ankle plantarflexion     Ankle inversion     Ankle eversion      (Blank rows = not tested)  LOWER EXTREMITY SPECIAL TESTS:  08/02/2023 No specific testing  FUNCTIONAL TESTS:  08/23/2023: Lt SLS 12 seconds  Rt SLS 30 seconds  08/02/2023 18 inch chair transfer: able to perform without UE but weight shift to Rt leg was noted.  Lt SLS: < 5 seconds with trendelenberg Rt SLS: < 10 seconds with  trendelenberg  GAIT: 08/02/2023 Independent ambulation in community.                                                                                                                                                                         TODAY'S TREATMENT                                                         DATE:  08/23/2023 Therex: Nustep lvl 6 11 mins UE/LE Incline gastroc stretch 30 sec x 3 bilateral  SLSl review for HEP.  Performed 30 sec x 2 bilateral.  Lt required HHA at times.   Verbal review of existing HEP for techniques and cues.   TherActivity TRX squat over chair 2 x 15 TRX curtsy lunge review with slider 2 x10 , without slide x 5 bilaterally  Lateral step down 4 inch heel tap x 15 bilateral    TODAY'S TREATMENT                                                         DATE: 08/19/23 TherEx  NuStep L6 x 10 min UE/LE  TherAct TRX squats 2x10 TRX lateral lunges 2x10 TRX deadlift "good mornings" 2x10 TRX curtsy lunges 2x10 LLE SLS with taps to Blaze [employer]s x 5 cycles:30 sec on/30 sec off; intermittent UE support LLE on 4" step with forward heel taps 2x10; intermittent UE support   TODAY'S TREATMENT                                                         DATE: 08/11/23 TherEx NuStep L6 x 10 min UE/LE  TherAct Lateral lunge with TRX straps x 10 reps bil Curtsy lunge with TRX straps x10 reps bil Reverse lunges with TRX straps x10 reps bil Decline squats 12# KB x 10 reps SLS with taps to cones; light UE support needed x 10 reps bil  Manual IASTM with percussive device to Lt lateral quad/hamstring/lateral glute    TODAY'S TREATMENT                                                         DATE: 08/02/2023 Therex: Nustep lvl 5 10 mins UE/LE Supine Clamshell green tband bilateral x15 ea Bridges with hip abduction against green tband x15 ea  Hooklying Isometric Clamshell w/ 5sec hold x10 Sit to Stand x10 no UE use  Verbal review of HEP and updated HEP to address  complaints noted in clam shell.   Manual Percussive device to Lt glute med/min, lateral aspect of Lt quad.     PATIENT EDUCATION:  08/02/2023 Education details: HEP, POC Person educated: Patient Education method: Explanation, Demonstration, Verbal cues, and Handouts Education comprehension: verbalized understanding, returned demonstration, and verbal cues required  HOME EXERCISE PROGRAM: Access Code: [email or web address] URL: [email or web address] Date: 08/11/2023 Prepared by: Moshe Cipro  Exercises - Supine Figure 4 Piriformis Stretch  - 2-3 x daily - 7 x weekly - 1 sets - 3-5 reps - 15 hold - Supine Piriformis Stretch with Foot on Ground (Mirrored)  - 2-3 x daily - 7 x weekly - 1 sets - 3-5 reps - 15 hold - Supine Bridge with Resistance Band  - 1-2 x daily - 7 x weekly - 2-3 sets - 10 reps - Sit to Stand  - 3 x daily - 7 x weekly - 1 sets - 10 reps - Hooklying Isometric Clamshell  - 1-2 x daily - 7 x weekly - 2-3 sets - 10-15 reps - Lateral Lunge with TRX  - 1 x daily - 7 x weekly - 2-3 sets - 10 reps - Curtsy Lunge with TRX  - 1 x daily - 7 x weekly - 2-3 sets - 10 reps - Assisted Lunge with TRX  - 1 x daily - 7 x weekly - 2-3 sets - 10 reps - Squat on Decline Board  - 1 x daily - 7 x weekly - 2-3 sets - 10 reps - Single Leg Balance with Clock Reach  - 1 x daily - 7 x weekly - 2-3 sets - 10 reps  ASSESSMENT:  CLINICAL IMPRESSION: The patient has attended 4 visits over the course of treatment cycle.  Patient has reported overall improvement at 90 %.  See objective data above for updated information regarding current presentation. At this time, Pt was confident in ability to transition to HEP at this time. Clinician was in agreement.     OBJECTIVE IMPAIRMENTS: decreased activity tolerance, decreased balance, decreased coordination, decreased endurance, decreased mobility, difficulty walking, decreased ROM, decreased strength, increased fascial restrictions, impaired  perceived functional ability, impaired flexibility, improper body mechanics, and pain.   ACTIVITY LIMITATIONS: carrying, lifting, bending, sitting, standing, squatting, sleeping, stairs, transfers, and locomotion level  PARTICIPATION LIMITATIONS: meal prep, cleaning, interpersonal relationship, driving, shopping, community activity, and occupation  PERSONAL FACTORS:  Med Hx: Anxiety, history of carcinoma, asthma, Depgression, GERD, Hyperlipidemia, IBS, substance abuse per chart review.   are also affecting patient's functional outcome.   REHAB POTENTIAL: Good  CLINICAL DECISION MAKING: Stable/uncomplicated  EVALUATION COMPLEXITY: Low   GOALS: Goals reviewed with patient? Yes  SHORT TERM GOALS: (target date for Short term goals are 3 weeks 08/23/2023)   1.  Patient will demonstrate independent use of home exercise program to maintain progress from in clinic treatments. Goal status: Met  LONG TERM GOALS: (target dates for all long term goals are 8 weeks  09/27/2023 )   1. Patient will demonstrate/report pain at worst less than or equal to 2/10 to facilitate minimal limitation in daily activity secondary to pain symptoms. Goal status: Met   2. Patient will demonstrate independent use of home exercise program to facilitate ability to maintain/progress functional gains from skilled physical therapy services. Goal status: Met   3. Patient will demonstrate Patient specific functional scale avg > or = 7 to indicate reduced disability due to condition.  Goal status: Met   4.  Patient will demonstrate bilateral  LE MMT 5/5 throughout to faciltiate usual transfers, stairs, squatting at [facility] for daily life.  Goal status: mostly met   5.  Patient will demonstrate/report ability to return to YOGA instruction and gym workouts at [facility]  Goal status: mostly met   6.  Patient will demonstrate bilateral SLS > 10 seconds to facilitate stability in ambulation.  Goal status:  Met    PLAN:  PT  FREQUENCY: 1-2x/week  PT DURATION: 8 weeks  PLANNED INTERVENTIONS: Can include 09811- PT Re-evaluation, 97110-Therapeutic exercises, 97530- Therapeutic activity, [id number]- Neuromuscular re-education, 97535- Self Care, 97140- Manual therapy, [id number]- Gait training, [phone]- Orthotic Fit/training, [phone]- Canalith repositioning, [id number]- Aquatic Therapy, 97014- Electrical stimulation (unattended), [id number]- Electrical stimulation (manual), [id number] Physical performance testing, 97016- Vasopneumatic device, [id number]- Ultrasound, [id number]- Traction (mechanical), [id number]- Ionotophoresis 4mg /ml Dexamethasone, Patient/Family education, Balance training, Stair training, Taping, Dry Needling, Joint mobilization, Joint manipulation, Spinal manipulation, Spinal mobilization, Scar mobilization, Vestibular training, Visual/preceptual remediation/compensation, DME instructions, Cryotherapy, and Moist heat.  All performed as medically necessary.  All included unless contraindicated  PLAN FOR NEXT SESSION:  Discharged    Bonna Bustard, PT, DPT, OCS, ATC 08/23/23  9:27 AM

## 2023-08-27 ENCOUNTER — Ambulatory Visit (INDEPENDENT_AMBULATORY_CARE_PROVIDER_SITE_OTHER)

## 2023-08-27 ENCOUNTER — Ambulatory Visit
Admission: EM | Admit: 2023-08-27 | Discharge: 2023-08-27 | Disposition: A | Discharge status: Home / Self Care | Attending: Family Medicine | Admitting: Family Medicine

## 2023-08-27 DIAGNOSIS — I700 Atherosclerosis of aorta: Secondary | ICD-10-CM | POA: Diagnosis not present

## 2023-08-27 DIAGNOSIS — B349 Viral infection, unspecified: Secondary | ICD-10-CM

## 2023-08-27 DIAGNOSIS — R059 Cough, unspecified: Secondary | ICD-10-CM | POA: Diagnosis not present

## 2023-08-27 DIAGNOSIS — R051 Acute cough: Secondary | ICD-10-CM

## 2023-08-27 LAB — POC COVID19/FLU A&B COMBO
Covid Antigen, POC: NEGATIVE
Influenza A Antigen, POC: NEGATIVE
Influenza B Antigen, POC: NEGATIVE

## 2023-08-27 LAB — POC RSV: RSV Antigen, POC: NEGATIVE

## 2023-08-27 MED ORDER — PROMETHAZINE-DM 6.25-15 MG/5ML PO SYRP: 5.0000 mL | ORAL_SOLUTION | Freq: Three times a day (TID) | ORAL | 0 refills | Status: DC | PRN

## 2023-08-27 NOTE — ED Triage Notes (Signed)
 Pt present with c/o persistent cough, hoarse voice, fatigue x 2 weeks. Pt states she has chest congestion. Reports she was seen at [facility] last Saturday and has not felt better.

## 2023-08-27 NOTE — ED Provider Notes (Addendum)
 [facility]    CSN: [id number] Arrival date & time: 08/27/23  4098      History   Chief Complaint Chief Complaint  Patient presents with   Cough    HPI Donna [name] is a 73 y.o. female  presents for evaluation of URI symptoms for 8-9 days. Patient reports associated symptoms of productive cough with fatigue, congestion. She states she developed a fever that began 3 days ago and this was the first time she had a fever since symptom onset.  She also reports at that point her symptoms had worsened.  She was seen in urgent care on 4/12 for 5 days of symptoms.  She had a negative rapid RSV and a chest x-ray was negative.  She was started on azithromycin  and cefuroxime  which she has continued to take.  She feels like her symptoms have not improved.  She does report a negative COVID test prior to her urgent care visit on 4/12.  She also states she was exposed to PNA via her in-laws.  Denies N/V/D, sore throat, ear pain, body aches, shortness of breath. Patient does have a hx of asthma.  Has been using her albuterol inhaler when needed.  Patient is not an active smoker.     Pt has taken cough drops OTC for symptoms. Pt has no other concerns at this time.    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   chest - CX3 + 5FU   Squamous cell carcinoma of skin 09/27/2014   left thigh - tx p bx   Substance  abuse (HCC)     Patient Active Problem List   Diagnosis Date Noted   Exposure to hepatitis B 07/01/2023   Need for immunization against viral hepatitis 07/01/2023   Screening for STDs (sexually transmitted diseases) 07/01/2023   History of substance use 07/01/2023   S/P appendectomy 05/11/2023   S/P laparoscopic appendectomy 05/11/2023   Gasping for breath 08/19/2021   PTSD (post-traumatic stress disorder) 08/19/2021   Parasomnia overlap disorder 08/19/2021   Snoring 08/19/2021   Excessive daytime sleepiness 08/19/2021   Sleep paralysis, recurrent isolated 08/19/2021   Asthma 02/19/2021   Multiple drug allergies 02/19/2021   Gluten intolerance 02/19/2021   Chronic rhinitis 02/19/2021   Metatarsalgia of left foot 11/14/2020   Achilles tendinosis of right lower extremity 11/14/2020   Essential tremor 07/04/2019   DOE (dyspnea on exertion) 01/20/2019   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 06/13/2018   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   Superficial basal cell carcinoma 06/13/2018   Upper airway cough syndrome 06/13/2018   Atrophic vaginitis 02/17/2018   Bilateral leg cramps 02/17/2018   Fatty liver, alcoholic 04/22/2016   Depression 04/19/2016   Withdrawal complaint 04/19/2016  Hx of adenomatous colonic polyps 08/03/2015   Herpes simplex 06/15/2014   GERD (gastroesophageal reflux disease) 10/14/2013   Anxiety state 10/14/2013   Recovering alcoholic in remission (HCC) 10/14/2013   Allergic rhinitis 10/14/2013   Adjustment disorder with anxiety 10/14/2013   Elevated low-density lipoprotein level 10/14/2013   Essential hypertension 10/15/2012   Heart murmur 05/25/2012    Past Surgical History:  Procedure Laterality Date   ANKLE SURGERY     Bilateral    CATARACT EXTRACTION Bilateral    CERVICAL SPINE SURGERY  07/21/2021   LAPAROSCOPIC APPENDECTOMY N/A 05/11/2023   Procedure: APPENDECTOMY LAPAROSCOPIC;  Surgeon: Dorena Gander, MD;  Location: [facility] OR;  Service: General;  Laterality: N/A;   TUBAL LIGATION      OB History   No obstetric history on file.      Home Medications    Prior to Admission medications   Medication Sig Start Date End Date Taking? Authorizing Provider  promethazine -dextromethorphan (PROMETHAZINE -DM) 6.25-15 MG/5ML syrup Take 5 mLs by mouth 3 (three) times daily as needed for cough. 08/27/23  Yes Yulianna Folse, Jodi R, NP  albuterol (VENTOLIN HFA) 108 (90 Base) MCG/ACT inhaler Inhale 2 puffs into the lungs every 4 (four) hours as needed. 08/04/21   [provider]  azithromycin  (ZITHROMAX ) 250 MG tablet Take first 2 tablets together, then 1 every day until finished. 08/21/23   Banister, Pamela K, MD  b complex vitamins tablet Take 1 tablet by mouth daily.      [provider]  benzonatate  (TESSALON ) 100 MG capsule Take 1 capsule (100 mg total) by mouth 3 (three) times daily as needed for cough. 08/21/23   Ann Keto, MD  budesonide -formoterol  (SYMBICORT ) 80-4.5 MCG/ACT inhaler TAKE 2 PUFFS BY MOUTH EVERY 6 HOURS AS NEEDED 06/28/23   Quillian Brunt, MD  calcium  gluconate 500 MG tablet Take 500 mg by mouth daily.    [provider]  cefUROXime  (CEFTIN ) 250 MG tablet Take 1 tablet (250 mg total) by mouth 2 (two) times daily with a meal. 08/21/23   Banister, Pamela K, MD  ciclopirox (PENLAC) 8 % solution Apply 1 Application topically daily. For the toenail 05/01/23   [provider]  diclofenac Sodium (VOLTAREN) 1 % GEL Apply 2 g topically daily as needed.    [provider]  Efinaconazole  (JUBLIA ) 10 % SOLN Apply 1 application  topically at bedtime. 08/12/23   Dellar Fenton, DO  Estradiol  (YUVAFEM ) 10 MCG TABS vaginal tablet Place 1 tablet vaginally once a week. On Sundays    [provider]  lisinopril  (ZESTRIL ) 5 MG tablet TAKE 1 TABLET BY MOUTH DAILY FOR ELEVATED BP WHILE TRAVELING 11/05/19   Sylvan Evener, MD  loratadine (CLARITIN) 10  MG tablet Take 10 mg by mouth daily as needed (allergy  season).    [provider]  Magnesium 500 MG TABS Take 1 tablet by mouth daily.      [provider]  metoprolol  tartrate (LOPRESSOR ) 25 MG tablet Take 0.5 tablets (12.5 mg total) by mouth every 12 (twelve) hours as needed (for palpitations or increased HR > 100). 06/10/23   Swinyer, Leilani Punter, NP  montelukast  (SINGULAIR ) 10 MG tablet Take 1 tablet (10 mg total) by mouth at bedtime. 07/27/23   Quillian Brunt, MD  Multiple Vitamin (MULTIVITAMIN) capsule Take 1 capsule by mouth daily.      [provider]  propranolol  (INDERAL ) 10 MG tablet Take 10 mg by mouth as needed. 12/02/20   [provider]  valACYclovir  (VALTREX ) 500 MG tablet ONE BY MOUTH TWICE A DAY X 5 DAYS FOR HERPES OUTBREAK 11/25/19   Baxley, Jaynie Meyers, MD    Family History Family History  Problem Relation Age of Onset   Dementia Mother    Diabetes Mother    Melanoma Father    Colon cancer Father 57       Survived   Heart disease Father    Heart attack Father    Rheum arthritis Father    Dementia Father    Healthy Sister    Healthy Brother    Dementia Maternal Aunt    Dementia Maternal Uncle    Colon polyps Paternal Aunt    Pancreatic cancer Paternal Aunt    Rectal cancer Paternal Aunt    Colon cancer Paternal Aunt    Colon cancer Paternal Grandmother    Breast cancer Maternal Grandmother    Stomach cancer Neg Hx    Esophageal cancer Neg Hx    Liver cancer Neg Hx     Social History Social History   Tobacco Use   Smoking status: Former    Current packs/day: 0.00    Types: Cigarettes    Start date: 10/16/1966    Quit date: 10/15/1996    Years since quitting: 26.8   Smokeless tobacco: Never  Vaping Use   Vaping status: Never Used  Substance Use Topics   Alcohol use: No   Drug use: No     Allergies   Ciprofloxacin, Corticosteroids, Doxycycline, Gluten meal, Incruse ellipta  [umeclidinium bromide ], Montelukast ,  Penicillins, Prednisone, Quinolones, and Statins   Review of Systems Review of Systems  Constitutional:  Positive for fever.  HENT:  Positive for congestion.   Respiratory:  Positive for cough.      Physical Exam Triage Vital Signs ED Triage Vitals [08/27/23 0842]  Encounter Vitals Group     BP (!) 155/93     Systolic BP Percentile      Diastolic BP Percentile      Pulse Rate 68     Resp 18     Temp 98.1 F (36.7 C)     Temp Source Oral     SpO2 98 %     Weight      Height      Head Circumference      Peak Flow      Pain Score 6     Pain Loc      Pain Education      Exclude from Growth Chart    No data found.  Updated Vital Signs BP (!) 155/93 (BP Location: Right Arm)   Pulse 68   Temp 98.1 F (36.7 C) (Oral)   Resp 18   SpO2 98%   Visual Acuity Right Eye Distance:   Left Eye Distance:   Bilateral Distance:    Right Eye Near:   Left Eye Near:    Bilateral Near:     Physical Exam Vitals and nursing note reviewed.  Constitutional:      General: She is not in acute distress.    Appearance: She is well-developed. She is not ill-appearing.  HENT:     Head: Normocephalic and atraumatic.     Right Ear: Tympanic membrane and ear canal normal.     Left Ear: Tympanic membrane and ear canal normal.     Nose: Congestion present.     Mouth/Throat:     Mouth: Mucous membranes are moist.     Pharynx: Oropharynx is  clear. Uvula midline. Posterior oropharyngeal erythema present. No oropharyngeal exudate.     Tonsils: No tonsillar exudate or tonsillar abscesses.  Eyes:     Conjunctiva/sclera: Conjunctivae normal.     Pupils: Pupils are equal, round, and reactive to light.  Cardiovascular:     Rate and Rhythm: Normal rate and regular rhythm.     Heart sounds: Normal heart sounds.  Pulmonary:     Effort: Pulmonary effort is normal.     Breath sounds: Normal breath sounds. No wheezing, rhonchi or rales.  Musculoskeletal:     Cervical back: Normal range of motion  and neck supple.  Lymphadenopathy:     Cervical: No cervical adenopathy.  Skin:    General: Skin is warm and dry.  Neurological:     General: No focal deficit present.     Mental Status: She is alert and oriented to person, place, and time.  Psychiatric:        Mood and Affect: Mood normal.        Behavior: Behavior normal.      UC Treatments / Results  Labs (all labs ordered are listed, but only abnormal results are displayed) Labs Reviewed  POC COVID19/FLU A&B COMBO  POC RSV   tains abnormal data Comprehensive metabolic panel Order: [id number]  Status: Final result     Next appt: 09/01/2023 at 08:00 AM in Physical Medicine and Rehabilitation Marrion Sjogren, PsyD)   Test Result Released: Yes (seen)   0 Result Notes          Component Ref Range & Units (hover) 3 wk ago (08/01/23) 1 mo ago (07/19/23) 3 mo ago (05/12/23) 3 mo ago (05/11/23) 1 yr ago (07/24/22) 2 yr ago (02/02/21) 3 yr ago (07/02/20)  Sodium 132 Low  137 R 130 Low  131 Low  137 R 135 136 R  Potassium 4.0 4.3 R 3.8 4.0 4.5 R 3.9 4.4 R  Chloride 101 99 R 98 94 Low  100 R 103 100 R  CO2 22 23 R 25 30 24  R 25 28 R  Glucose, Bld 107 High  91 112 High  CM 109 High  CM 91 118 High  CM 102 High  R, CM  Comment: Glucose reference range applies only to samples taken after fasting for at least 8 hours.  BUN 12 10 R 12 18 11  R 12 15 R  Creatinine, Ser 0.61 0.68 R 0.80 0.71 0.77 R 0.74 0.73 R, CM  Calcium  8.8 Low  9.1 R 8.4 Low  9.2 9.0 R 8.9 9.3 R  Total Protein 6.5 6.4 R  7.0   6.6 R  Albumin 3.7 4.1 R  4.3     AST 20 23 R  19   22 R  ALT 16 17 R  14   21 R  Alkaline Phosphatase 63 77 R  55     Total Bilirubin 0.8 0.4  0.9   0.6 R  GFR, Estimated >60  >60 CM >60 CM  >60 CM   Comment: (NOTE) Calculated using the CKD-EPI Creatinine Equation (2021)  Anion gap 9  7 CM 7 CM  7 CM   Comment: Performed at [facility], [address] [place], [place] [address]  Resulting Agency [facility]  [facility]        Specimen Collected: 08/01/23 09:42 Last Resulted: 08/01/23 10:23    EKG   Radiology DG Chest  2 View Result Date: 08/27/2023 CLINICAL DATA:  cough worsening on antibiotics, exposure PNA EXAM: CHEST - 2 VIEW COMPARISON:  08/21/2023. FINDINGS: Cardiac silhouette is unremarkable. No pneumothorax or pleural effusion. The lungs are clear. Aorta is calcified. The visualized skeletal structures are unremarkable. IMPRESSION: No acute cardiopulmonary process. Electronically Signed   By: Sydell Eva M.D.   On: 08/27/2023 09:21    Procedures Procedures (including critical care time)  Medications Ordered in UC Medications - No data to display  Initial Impression / Assessment and Plan / UC Course  I have reviewed the triage vital signs and the nursing notes.  Pertinent labs & imaging results that were available during my care of the patient were reviewed by me and considered in my medical decision making (see chart for details).     Reviewed exam and symptoms with patient.  No red flags.  Negative rapid flu COVID and RSV PCR as well as negative chest x-ray.  Discussed symptoms likely viral.  She only has 1 day left of her antibiotics and I advised her to complete.  Continue albuterol inhaler as needed.  She does not tolerate steroids of any kind including oral or inhaled.  Will do Promethazine  DM as needed for cough, side effect profile reviewed.  Discussed rest fluids and PCP follow-up 2 to 3 days for recheck.  ER precautions reviewed and patient verbalized understanding. Final Clinical Impressions(s) / UC Diagnoses   Final diagnoses:  Acute cough  Viral illness     Discharge Instructions      Continue your albuterol inhaler as needed.  Promethazine  DM as needed for your cough.  Please note this medication can make you drowsy.  Do not drink alcohol or drive while on this medication.  Lots of rest and fluids.  Please follow-up with  your PCP in 2 to 3 days for recheck.  Please go to the ER if you develop any worsening symptoms.  I hope you feel better soon!     ED Prescriptions     Medication Sig Dispense Auth. Provider   promethazine -dextromethorphan (PROMETHAZINE -DM) 6.25-15 MG/5ML syrup Take 5 mLs by mouth 3 (three) times daily as needed for cough. 118 mL Vanice Rappa, Jodi R, NP      PDMP not reviewed this encounter.   Alleen Arbour, NP 08/27/23 0946    Alleen Arbour, NP 08/27/23 [phone]

## 2023-08-27 NOTE — Discharge Instructions (Addendum)
 Continue your albuterol inhaler as needed.  Promethazine  DM as needed for your cough.  Please note this medication can make you drowsy.  Do not drink alcohol or drive while on this medication.  Lots of rest and fluids.  Please follow-up with your PCP in 2 to 3 days for recheck.  Please go to the ER if you develop any worsening symptoms.  I hope you feel better soon!

## 2023-09-01 ENCOUNTER — Encounter: Payer: Self-pay | Admitting: Psychology

## 2023-09-01 ENCOUNTER — Encounter: Payer: Medicare HMO | Attending: Psychology | Admitting: Psychology

## 2023-09-01 DIAGNOSIS — F419 Anxiety disorder, unspecified: Secondary | ICD-10-CM

## 2023-09-01 DIAGNOSIS — F4310 Post-traumatic stress disorder, unspecified: Secondary | ICD-10-CM

## 2023-09-01 DIAGNOSIS — F32A Depression, unspecified: Secondary | ICD-10-CM | POA: Diagnosis not present

## 2023-09-01 DIAGNOSIS — R4189 Other symptoms and signs involving cognitive functions and awareness: Secondary | ICD-10-CM | POA: Diagnosis not present

## 2023-09-01 DIAGNOSIS — G4719 Other hypersomnia: Secondary | ICD-10-CM | POA: Diagnosis not present

## 2023-09-01 DIAGNOSIS — R413 Other amnesia: Secondary | ICD-10-CM | POA: Insufficient documentation

## 2023-09-01 NOTE — Progress Notes (Signed)
 Neuropsychological Consultation   Patient:   Donna [name]   DOB:   06-May-1951  MR Number:  [id number]  Location:  [facility]HABIL[facility] [address] [place], [address] [place] [address] Dept: [phone]           Date of Service:   06/07/2023  Location of Service and Individuals present: Today's visit was conducted in my outpatient clinic office and was an in person visit.  Start Time:   3 PM End Time:   5 PM  Today's visit was 1 hour and 15 minutes of formal face-to-face clinical interview and the other 45 minutes was spent with records review, report writing and setting up testing protocols.  Consent for Evaluation and Treatment:  Signed:  Yes Explanation of Privacy Policies:  Signed:  Yes Discussion of Confidentiality Limits:  Yes  Provider/Observer:  Chapman Commodore, Psy.D.       Clinical Neuropsychologist       Billing Code/Service: [id number]  Chief Complaint:     Chief Complaint  Patient presents with   Memory Loss   Anxiety   Post-Traumatic Stress Disorder   Back Pain   Pain    Reason for Service:    Donna [name] is a 73 year old female referred for neuropsychological consultation due to concerns around memory changes that she has been noting slowly progressing over time.  The patient reports that she has been increasingly forgetful, inattentive and describes anxiety/racing thoughts that are interrupting of thought processes.  Patient's primary memory issues are associated with inattentiveness and often are exacerbated during times of worry leaving her forgetful for in the moment activities.  Anxiety has been a long-term issue and she has received care through the years including the addition of BuSpar.  Patient describes difficulty with recall and word finding issues and trouble with retrieval of word meaning and having to look up words when she is reading.  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  Patient is also noted difficulties recalling people's names that she is acquainted with.  Patient does note a lot of family stressors have been going on recently.  The patient's younger brother has cancer and patient provided care for her mother.  Patient has significant sleep disturbance and has been reviewed for sleep disorder/narcolepsy.  The patient is described as having excessive daytime sleepiness and notes very vivid dreams at night with isolated cataplexy and her most vivid dreams occurring in the morning and can be so vivid they are realistic in nature.  Patient notes times of sleep paralysis as well.  Anxiety at night tends to trigger insomnia and delayed onset of sleep that is exacerbated by stress.  Patient's PTSD symptoms that have included nightmares at night have been less over time.  Patient has a past medical history including anxiety, asthma, basal cell carcinoma, cataract, cervicalgia, anxiety/depression, GERD, heart murmur, hiatal hernia, hyperlipidemia, irritable bowel syndrome, squamous cell carcinoma, substance abuse.  Her essential tremor has been present in some form since childhood and is a familiar trait.  Patient has significant hip and back pain and is being followed with PT.  Patient recently had a concussive event where [place] with [place] and onset of significant headaches.  At the time, patient had head CT with no acute process noted.  Imaging has shown  mild chronic small vessel ischemic disease that has not progressed from previous images.  There is a positive family history for her parents being diagnosed with some form of dementia.  The patient's father was diagnosed in his mid 30s and passed away at 23.  The patient's mother is described as having fast progressive dementia of late onset with symptoms developing around age  68-83 and recently passed at age 29.  Patient does have tremor that has been diagnosed as essential tremor by Dr. Festus Hubert.  Her tremors have been stable for some time neurology reviews over the past couple of years have not identified significant objective findings of cognitive decline and they have felt that her primary subjective memory difficulties are related to underlying anxiety versus progressive cognitive decline.  Patient is noted to have elevated blood pressure at time which is associated with increased anxiety.  Patient had Montreal cognitive assessment screening exam conducted on 07/09/2022 with a 25/30 obtained.  Patient's primary weakness was noted for attentional variables including encoding and issues with processing information in her auditory Register.  Patient had mild weakness with regard to delayed recall recalling 3 of 5 words, making mistakes and serial 7 and repetition of numbers.  Patient did well on visual constructional elements, visual scanning and searching issues and primary issues were noted as attentional in nature.  Patient had ATN profile conducted on 07/09/2022 that showed a decrease beta amyloid 42/40 ratio and increased levels of P tau 181 concentrations.  While these results are not definitive in and of themselves they are consistent with patterns typically seen in the presence of Alzheimer's.  Patient had PET metabolic brain scan conducted on 08/27/2022 assessing factors that can be associated with frontotemporal dementia versus Alzheimer's type dementia.  In this study there was no decreased relative cortical metabolism to suggest Alzheimer's type pathology or frontotemporal dementia.  Cerebral cortical metabolism was within normal limits.   Back Pain Hip Pain, history of PTSD, Anxiety, one Concussive event  Medical History:   Past Medical History:  Diagnosis Date   Anxiety    Asthma    Basal cell carcinoma 06/29/2017   right lip - CX3 + 5FU   BCC (basal cell  carcinoma of skin) 10/29/2022   left lower leg, posterior - Mohs 01/27/2023   Cataract    bilateral lens implants   Cough variant asthma    Depression    GERD (gastroesophageal reflux disease)    Heart murmur    Hiatal hernia    Hyperlipidemia    IBS (irritable bowel syndrome)    SCC (squamous cell carcinoma) 10/29/2022   right lower leg, anterior - Mohs 12/28/2022 Dr Gaylyn Keas   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   chest - CX3 + 5FU   Squamous cell carcinoma of skin 09/27/2014   left thigh - tx p bx   Substance abuse (HCC)          Patient Active Problem List   Diagnosis Date Noted   Exposure to hepatitis B 07/01/2023   Need for immunization against viral hepatitis 07/01/2023   Screening for STDs (sexually transmitted diseases) 07/01/2023   History of substance use 07/01/2023   S/P appendectomy 05/11/2023   S/P laparoscopic appendectomy 05/11/2023   Gasping for breath 08/19/2021   PTSD (post-traumatic stress disorder) 08/19/2021   Parasomnia overlap disorder 08/19/2021   Snoring 08/19/2021   Excessive daytime sleepiness 08/19/2021   Sleep paralysis, recurrent isolated 08/19/2021   Asthma 02/19/2021   Multiple  drug allergies 02/19/2021   Gluten intolerance 02/19/2021   Chronic rhinitis 02/19/2021   Metatarsalgia of left foot 11/14/2020   Achilles tendinosis of right lower extremity 11/14/2020   Essential tremor 07/04/2019   DOE (dyspnea on exertion) 01/20/2019   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 06/13/2018   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   Superficial basal cell carcinoma 06/13/2018   Upper airway cough syndrome 06/13/2018   Atrophic vaginitis 02/17/2018   Bilateral leg cramps 02/17/2018   Fatty liver, alcoholic 04/22/2016   Depression 04/19/2016   Withdrawal complaint 04/19/2016   Hx of adenomatous colonic polyps 08/03/2015   Herpes simplex 06/15/2014   GERD (gastroesophageal  reflux disease) 10/14/2013   Anxiety state 10/14/2013   Recovering alcoholic in remission (HCC) 10/14/2013   Allergic rhinitis 10/14/2013   Adjustment disorder with anxiety 10/14/2013   Elevated low-density lipoprotein level 10/14/2013   Essential hypertension 10/15/2012   Heart murmur 05/25/2012     Additional Tests and Measures from other records:  Neuroimaging Results: Patient had MRI of brain with and without contrast conducted on 03/06/2021 due to memory loss and word finding difficulties reported.  There was mild chronic small vessel ischemic disease noted with small T2 flair hyperintensities in the cerebral white matter bilaterally that were new when compared to MRI from 2011 that were generally nonspecific but compatible with mild chronic small vessel ischemic disease.  There was no other acute intracranial abnormality noted.  CT head was conducted on 04/30/2022 after her concussive event.  Mild cerebral atrophy was noted and microvascular disease changes were also noted in this exam but no acute cranial abnormality noted.  Laboratory Tests:  Patient had ATN profile conducted on 07/09/2022 that showed a decrease beta amyloid 42/40 ratio and increased levels of P tau 181 concentrations.  While these results are not definitive in and of themselves they are consistent with patterns typically seen in the presence of Alzheimer's.   Sleep: Patient has significant sleep disorder with insomnia and delayed onset of REM sleep.  Patient has been assessed for issues related to narcolepsy/cataplexy.   Behavioral Observation/Mental Status:   Donna [name]  presents as a 73 y.o.-year-old Right handed Caucasian Female who appeared her stated age. her dress was Appropriate and she was Well Groomed and her manners were Appropriate to the situation.  her participation was indicative of Appropriate and Redirectable behaviors.  There were not physical disabilities noted.  she displayed an appropriate  level of cooperation and motivation.    Interactions:    Active Appropriate  Attention:   abnormal and attention span appeared shorter than expected for age  Memory:   within normal limits; recent and remote memory intact  Visuo-spatial:   not examined  Speech (Volume):  normal  Speech:   normal; normal  Thought Process:  Coherent and Relevant  Coherent, Linear, and Logical  Though Content:  WNL; not suicidal and not homicidal  Orientation:   person, place, time/date, and situation  Judgment:   Good  Planning:   Fair  Affect:    Anxious  Mood:    Anxious  Insight:   Good  Intelligence:   normal  Educational and Occupational History:     Highest Level of Education:   Associate degree  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  Patient took Lexapro  prescribed by PCP in 2017 and also had a trial of sertraline  around that time.  Patient also has a significant history of PTSD with intrusive thoughts and significant nightmare/vivid dreams.  History of Substance Use or Abuse:   Patient reports that she quit alcohol in 2017  Family Med/Psych History:  Family History  Problem Relation Age of Onset   Dementia Mother    Diabetes Mother    Melanoma Father    Colon cancer Father 17       Survived   Heart disease Father    Heart attack Father    Rheum arthritis Father    Dementia Father    Healthy Sister    Healthy Brother    Dementia Maternal Aunt    Dementia Maternal Uncle    Colon polyps Paternal Aunt    Pancreatic cancer Paternal Aunt    Rectal cancer Paternal Aunt    Colon cancer Paternal Aunt    Colon cancer Paternal Grandmother    Breast cancer Maternal Grandmother    Stomach cancer Neg Hx    Esophageal cancer Neg Hx    Liver cancer Neg Hx     Impression/DX:   Donna [name] is a  73 year old female referred for neuropsychological consultation due to concerns around memory changes that she has been noting slowly progressing over time.  The patient reports that she has been increasingly forgetful, inattentive and describes anxiety/racing thoughts that are interrupting of thought processes.  Patient's primary memory issues are associated with inattentiveness and often are exacerbated during times of worry leaving her forgetful for in the moment activities.  Anxiety has been a long-term issue and she has received care through the years including the addition of BuSpar.  Patient describes difficulty with recall and word finding issues and trouble with retrieval of word meaning and having to look up words when she is reading.  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  Patient is also noted difficulties recalling people's names that she is acquainted with.  Patient does note a lot of family stressors have been going on recently.  The patient's younger brother has cancer and patient provided care for her mother.  Patient has significant sleep disturbance and has been reviewed for sleep disorder/narcolepsy.  The patient is described as having excessive daytime sleepiness and notes very vivid dreams at night with isolated cataplexy and her most vivid dreams occurring in the morning and can be so vivid they are realistic in nature.  Patient notes times of sleep paralysis as well.  Anxiety at night tends to trigger insomnia and delayed onset of sleep that is exacerbated by stress.  Patient's PTSD symptoms that have included nightmares at night have been less over time.  Disposition/Plan:  We have set the patient up for formal neuropsychological assessment and she will complete a foundational battery of the controlled oral Word Association test, the Wechsler Adult Intelligence Scale and the Wechsler Memory Scale.   Once these are completed a determination will be made as to the need for further neuropsych testing to help answer the differential diagnostic question.  Diagnosis:    Subjective memory complaints  PTSD (post-traumatic stress disorder)  Anxiety and depression  Excessive daytime sleepiness        Note: This document was prepared using Dragon voice recognition software and may include unintentional dictation errors.   Electronically Signed  _______________________ Chapman Commodore, Psy.D. Clinical Neuropsychologist

## 2023-09-02 ENCOUNTER — Encounter: Payer: Self-pay | Admitting: Psychology

## 2023-09-02 ENCOUNTER — Encounter: Admitting: Rehabilitative and Restorative Service Providers"

## 2023-09-02 NOTE — Progress Notes (Signed)
 Neuropsychological Evaluation   Patient:  Donna [name]   DOB: 08/13/1950  MR Number: [id number]  Location: [facility] [address] [place], [address] [place] [address] Dept: [phone]  Start: 8 AM End: 9 AM  Provider/Observer:     Marrion Sjogren PsyD  Chief Complaint:      Chief Complaint  Patient presents with   Memory Loss   Anxiety   Post-Traumatic Stress Disorder   Back Pain   Pain    Reason For Service:      Donna [name] is a 73 year old female referred for neuropsychological consultation due to concerns around memory changes that she has been noting slowly progressing over time.  The patient reports that she has been increasingly forgetful, inattentive and describes anxiety/racing thoughts that are interrupting of thought processes.  Patient's primary memory issues are associated with inattentiveness and often are exacerbated during times of worry, leaving her forgetful for in the moment activities.  Anxiety has been a long-term issue and she has received care through the years, including the addition of BuSpar.  Patient describes difficulty with recall and word finding issues and trouble with retrieval of word meaning and having to look up words when she is reading.  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  Patient has also noted difficulties recalling people's names that she is acquainted with.  Patient does note a lot of family stressors have been going on recently.  The patient's younger brother has cancer and patient provided care for her mother.   Patient has significant sleep disturbance and has been reviewed for sleep disorder/narcolepsy.  The patient is described as having excessive daytime sleepiness and notes very vivid dreams at night with isolated cataplexy and her most vivid dreams  occurring in the early morning and can be so vivid they are realistic in nature.  Patient notes times of sleep paralysis as well.  Anxiety at night tends to trigger insomnia and delayed onset of sleep that is exacerbated by stress.  Patient's PTSD symptoms that have included nightmares at night have been less over time.   Patient has a past medical history including anxiety, asthma, basal cell carcinoma, cataract, cervicalgia, anxiety/depression, GERD, heart murmur, hiatal hernia, hyperlipidemia, irritable bowel syndrome, squamous cell carcinoma, substance abuse.  Her essential tremor has been present in some form since childhood and is a familiar trait.  Patient has significant hip and back pain and is being followed with PT.   Patient recently had a concussive event where [place] and onset of significant headaches.  At the time, patient had head CT with no acute process noted.  Imaging has shown mild chronic small vessel ischemic disease that has not progressed from previous images.   There is a positive family history for her parents being diagnosed with some form of dementia.  The patient's father was diagnosed in his mid 39s and passed away at 15.  The patient's mother is described as having fast progressive dementia of late onset with symptoms developing around age 66-83 and recently passed at age 60.  Patient does have tremor that has been diagnosed as essential tremor by Dr. Festus Hubert.  Her tremors have been stable for some time.  Neurology evaluations over the past couple of years have not identified significant objective findings of  cognitive decline and they have felt that her primary subjective memory difficulties are related to underlying anxiety versus progressive cognitive decline.  Patient is noted to have elevated blood pressure at time, which is associated with increased anxiety.   Patient had Montreal cognitive assessment screening exam  conducted on 07/09/2022 with a 25/30 obtained.  Patient's primary weakness was noted for attentional variables including encoding and issues with processing information in her auditory Register.  Patient had mild weakness with regard to delayed recall recalling 3 of 5 words, making mistakes and serial 7 and repetition of numbers.  Patient did well on visual constructional elements, visual scanning and searching issues and primary issues were noted as attentional in nature.   Patient had ATN profile conducted on 07/09/2022 that showed a decrease beta amyloid 42/40 ratio and increased levels of P tau 181 concentrations.  While these results are not definitive in and of themselves they are consistent with patterns typically seen in the presence of Alzheimer's.   Patient had PET metabolic brain scan conducted on 08/27/2022 assessing factors that can be associated with frontotemporal dementia versus Alzheimer's type dementia.  In this study there was no decreased relative cortical metabolism to suggest Alzheimer's type pathology or frontotemporal dementia.  Cerebral cortical metabolism was within normal limits.  Patient has a history of significant pain symptoms including back pain and hip pain along with history of posttraumatic stress disorder, anxiety and 1 recent concussive event.       Medical History:                         Past Medical History:  Diagnosis Date   Anxiety     Asthma     Basal cell carcinoma 06/29/2017    right lip - CX3 + 5FU   BCC (basal cell carcinoma of skin) 10/29/2022    left lower leg, posterior - Mohs 01/27/2023   Cataract      bilateral lens implants   Cough variant asthma     Depression     GERD (gastroesophageal reflux disease)     Heart murmur     Hiatal hernia     Hyperlipidemia     IBS (irritable bowel syndrome)     SCC (squamous cell carcinoma) 10/29/2022    right lower leg, anterior - Mohs 12/28/2022 Dr Gaylyn Keas   Squamous cell carcinoma in situ (SCCIS) of  conjunctiva 10/23/2008    chest - CX3 + 5FU   Squamous cell carcinoma of skin 09/27/2014    left thigh - tx p bx   Substance abuse [facility])                                                                 Patient Active Problem List    Diagnosis Date Noted   Exposure to hepatitis B 07/01/2023   Need for immunization against viral hepatitis 07/01/2023   Screening for STDs (sexually transmitted diseases) 07/01/2023   History of substance use 07/01/2023   S/P appendectomy 05/11/2023   S/P laparoscopic appendectomy 05/11/2023   Gasping for breath 08/19/2021   PTSD (post-traumatic stress disorder) 08/19/2021   Parasomnia overlap disorder 08/19/2021   Snoring 08/19/2021   Excessive daytime sleepiness 08/19/2021   Sleep paralysis, recurrent isolated 08/19/2021  Asthma 02/19/2021   Multiple drug allergies 02/19/2021   Gluten intolerance 02/19/2021   Chronic rhinitis 02/19/2021   Metatarsalgia of left foot 11/14/2020   Achilles tendinosis of right lower extremity 11/14/2020   Essential tremor 07/04/2019   DOE (dyspnea on exertion) 01/20/2019   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 06/13/2018   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   Superficial basal cell carcinoma 06/13/2018   Upper airway cough syndrome 06/13/2018   Atrophic vaginitis 02/17/2018   Bilateral leg cramps 02/17/2018   Fatty liver, alcoholic 04/22/2016   Depression 04/19/2016   Withdrawal complaint 04/19/2016   Hx of adenomatous colonic polyps 08/03/2015   Herpes simplex 06/15/2014   GERD (gastroesophageal reflux disease) 10/14/2013   Anxiety state 10/14/2013   Recovering alcoholic in remission (HCC) 10/14/2013   Allergic rhinitis 10/14/2013   Adjustment disorder with anxiety 10/14/2013   Elevated low-density lipoprotein level 10/14/2013   Essential hypertension 10/15/2012   Heart murmur 05/25/2012        Additional Tests and Measures from other records:    Neuroimaging Results: Patient had MRI of brain with and without contrast conducted on 03/06/2021 due to memory loss and word finding difficulties reported.  There was mild chronic small vessel ischemic disease noted with small T2 flair hyperintensities in the cerebral white matter bilaterally that were new when compared to MRI from 2011 that were generally nonspecific but compatible with mild chronic small vessel ischemic disease.  There was no other acute intracranial abnormality noted.   CT head was conducted on 04/30/2022 after her concussive event.  Mild cerebral atrophy was noted and microvascular disease changes were also noted in this exam but no acute cranial abnormality noted.   Laboratory Tests:  Patient had ATN profile conducted on 07/09/2022 that showed a decrease beta amyloid 42/40 ratio and increased levels of P tau 181 concentrations.  While these results are not definitive in and of themselves they are consistent with patterns typically seen in the presence of Alzheimer's.    Sleep: Patient has significant sleep disorder with insomnia and delayed onset of REM sleep.  Patient has been assessed for issues related to narcolepsy/cataplexy.  Tests Administered: Controlled Oral Word Association Test (COWAT; FAS & Animals)  Wechsler Adult Intelligence Scale, 4th Edition (WAIS-IV) Wechsler Memory Scale, 4th Edition (WMS-IV); Older Adult Battery   Participation Level:   Active  Participation Quality:  Appropriate      Behavioral Observation:  The patient appeared well-groomed and appropriately dressed. Her manners were polite and appropriate to the situation. The patient's attitude towards testing was positive and her effort was very good.   Alert, and Appropriate.   Test Results:   Consideration was made as to the validity of the current neuropsychological assessment.  Behavioral observations both in the extended formal clinical interview as well as observations during the  administration of cognitive measures suggested that the patient fully participated throughout the evaluation and there were no indications of problematic features indicative of situations that would invalidate the current test results.  Embedded validity checks were also within normal limits.  This does appear to be a fair and valid assessment of the patient's current status.  COWAT: FAS total= 45 Z= 0.80 Animals total= 22 Z= 1.50   The patient describes difficulties with naming and word finding issues.  To objectively assess features related to verbal fluency she was administered the controlled oral Word Association test.  On the FAS test, which assesses lexical fluency  the patient produced a total of 45 words in the allotted timeframe which is approaching 1 standard deviation above normative expectations with comparisons to age, gender and education matched comparison group.  The patient had even better performance on the animal naming portion where she named a total of 22 animals.  This is 1-1/2 standard deviations better than normative expectations.  There were no objective findings of expressive language deficits noted in this measure.  During the formal clinical interview there were no clear indications of any paraphasic errors or circumlocutions.  WAIS-IV:             Composite Score Summary          Scale Sum of Scaled Scores Composite Score Percentile Rank 95% Conf. Interval Qualitative Description  Verbal Comprehension 31 VCI 102 55 96-108 Average  Perceptual Reasoning 35 PRI 109 73 102-115 Average  Working Memory 16 WMI 89 23 83-96 Low Average  Processing Speed 24 PSI 111 77 102-118 High Average  Full Scale 106 FSIQ 104 61 100-108 Average  General Ability 66 GAI 105 63 100-110 Average    In order to provide a thorough assessment of a wide range of various cognitive domains in a structured well normed and readily available battery the patient was administered the Wechsler Adult  Intelligence Scale-IV.  As the patient is describing some cognitive changes more recently the current score should not be viewed as indicative of her lifelong cognitive functioning but a reflection current cognitive status.  2 Global/composite scores were calculated in this assessment.  The patient produced a full-scale IQ score 104 which falls at the 61st percentile and is in the average range relative to normative population.  This type of pattern would not be consistent with widespread cognitive change but does not rule out the possibility that specific cognitive domains are being impacted.  We also calculated the patient's general abilities index score which places less emphasis on elements that are most acutely vulnerable to change related to attention and information processing speed.  The patient produced a general abilities index score of 105 which falls at the 63rd percentile and in the average range.  As the patient showed specific attentional domain change from expected premorbid functioning related to auditory encoding variables and her capacity to actively process information in her auditory Register but at the same time showed her best area of functioning having to do with information processing speed these 2 elements essentially canceled each other out.  The patient shows average to high average level of performance within the verbal comprehensive domain and upper end of average for visual-spatial and visual construction/visual executive functioning domain and excellent information processing speed's.  Again, the only area from a global/composite score perspective that suggested any difficulties had to do with patient's primary capacity to actively process information in her auditory Register.          Verbal Comprehension Subtests Summary        Subtest Raw Score Scaled Score Percentile Rank Reference Group Scaled Score SEM  Similarities 22 9 37 9 0.95  Vocabulary 43 12 75 12 0.67  Information  15 10 50 11 0.73   The patient produced a verbal comprehension index score of 102 which falls at the 55th percentile and is in the average range without indicative patterns of any significant cognitive change.  The patient particular vocabulary strengths functioning at the 75th percentile and is maintaining her general fund of information.  While still in the average range her  relative weakest area had to do with verbal reasoning and problem-solving but this still was in the average range without clear indication of cognitive change or problems.  Perceptual Reasoning Subtests Summary        Subtest Raw Score Scaled Score Percentile Rank Reference Group Scaled Score SEM  Block Design 38 12 75 8 0.99  Matrix Reasoning 17 13 84 9 0.90  Visual Puzzles 11 10 50 7 0.99   The patient produced a perceptual reasoning index score of 109 which falls at the 73rd percentile and is in the average range relative to a normative population.  The patient did very well on measures of visual analysis and organization, visual problem-solving and capacity to perform whole-part problem-solving abilities.  The patient did show average performance with regard to fluid visual reasoning and problem-solving.  There were no individual subtest or specific cognitive challenges where the patient showed any degree of objective findings of cognitive deficits.  Working Comptroller Raw Score Scaled Score Percentile Rank Reference Group Scaled Score SEM  Digit Span 23 9 37 7 0.73  Arithmetic 10 7 16 7  1.20   The patient produced a working memory index score of 89 which falls at the 23rd percentile and is in the low average range relative normal normative population.  While the patient did fine on primary auditory encoding capacity she did show some weaknesses in her ability to actively process information in her auditory Register.  This pattern would be consistent with some issues that would result in memory  and learning as processing information is required for encoding information to be transferred into memory stores.  Processing Speed Subtests Summary        Subtest Raw Score Scaled Score Percentile Rank Reference Group Scaled Score SEM  Symbol Search 28 12 75 8 1.12  Coding 59 12 75 8 1.12   The patient produced a processing speed index score of 111 which falls at the 77 percentile and is in the high average range relative to a normative population.  This is a very good score and consistent with only mild or minimal small vessel disease.  The patient did very well on measures of visual scanning, visual searching and overall speed of mental operations (focus execute abilities).   WMS-IV:           Index Score Summary        Index Sum of Scaled Scores Index Score Percentile Rank 95% Confidence Interval Qualitative Descriptor  Auditory Memory (AMI) 40 100 50 94-106 Average  Visual Memory (VMI) 23 108 70 103-113 Average  Immediate Memory (IMI) 29 97 42 91-103 Average  Delayed Memory (DMI) 34 108 70 100-115 Average    In order to objectively assess memory and learning in a structured systematic way the patient was administered the Wechsler Memory Scale-IV.  On the Wechsler Adult Intelligence Scale, the patient showed a specific weakness that while mild could have an impact on memory related to effective storage and organization of new auditory information.  The patient had difficulties with actively processing information or auditory Register.  The patient showed similar relative weakness with regard to visual encoding and processing information in her visual Register.  In fact, these are the primary weaknesses and almost only weakness noted throughout the entire neuropsychological test battery.  The patient produced an auditory memory index score of 100 which falls at the 50th percentile and is in the average range and  consistent with predicted levels of performance based on her current verbal  comprehension index score performance.  The patient actually did better on visual memory components producing a visual memory index score of 108, which falls at the 70th percentile in the upper end of the average range.  This is also consistent with slightly better visual encoding versus auditory encoding components.  There were no indications of any lateralization of findings and the patient's auditory memory functions were in the average range.  Breaking memory functions down between immediate and delayed the patient produced an immediate memory index score of 97, which falls at the 42nd percentile and is in the average range.  The patient actually performed even better on delayed memory producing a delayed memory index score of 108, which fell at the 70th percentile.  This clearly indicates that with the patient is able to initially encode, store and organize is effectively maintained over a period of time without loss and good retention.  The patient also showed good performance under recognition/cued recall.         Primary Subtest Scaled Score Summary       Subtest Domain Raw Score Scaled Score Percentile Rank  Logical Memory I AM 32 10 50  Logical Memory II AM 24 12 75  Verbal Paired Associates I AM 14 8 25   Verbal Paired Associates II AM 6 10 50  Visual Reproduction I VM 33 11 63  Visual Reproduction II VM 24 12 75  Symbol Span VWM 12 8 25             Auditory Memory Process Score Summary      Process Score Raw Score Scaled Score Percentile Rank Cumulative Percentage (Base Rate)  LM II Recognition 17 - - 26-50%  VPA II Recognition 28 - - 51-75%         Visual Memory Process Score Summary      Process Score Raw Score Scaled Score Percentile Rank Cumulative Percentage (Base Rate)  VR II Recognition 5 - - 51-75%    ABILITY-MEMORY ANALYSIS   Ability Score:    VCI: 102 Date of Testing:           WAIS-IV; WMS-IV 2023/06/16             Predicted Difference Method    Index  Predicted WMS-IV Index Score Actual WMS-IV Index Score Difference Critical Value   Significant Difference Y/N Base Rate  Auditory Memory 101 100 1 10.41 N    Visual Memory 101 108 -7 7.35 N    Immediate Memory 101 97 4 9.69 N    Delayed Memory 101 108 -7 12.22 N     Summary of Results:   The results of the current neuropsychological evaluation looking at a wide range of various cognitive domains suggest that the patient is actually doing fairly well in a wide range of cognitive capacities.  The patient is maintaining good receptive and expressive language with well-maintained semantic and lexical fluency, very good retention of visual-spatial and visual constructional capacities, good visual reasoning and problem-solving capacity and only mild weaknesses in isolated domains around auditory reasoning and problem-solving capacity.  The patient is doing well on measures of memory and learning for both auditory and visual memory both immediate as well as delayed.  The only area of cognitive weakness and this was only a mild weakness, had to do with attentional variables.  The patient had some mild relative weaknesses with her capacity to actively process information in her  auditory and visual registers.  As this active processing is needed particularly in dynamic real-world setting this would have a negative impact on day-to-day learning efficiency.  When objectively assessed, the patient was able to effectively store and organize new information and retain that information over period of delay without loss.  This was true for both auditory and visual information.  The patient is global cognitive abilities appear to be well-maintained and again there are no other areas of cognitive deficit objectively found beyond those related to auditory and visual encoding weaknesses.  Impression/Diagnosis:   The results of the current neuropsychological evaluation are very encouraging.  There is no pattern identified  consistent with a progressive major neurocognitive disorder.  While the patient had some findings on her ATN profile it can be found with individuals with Alzheimer's type pathology her PET scan was not consistent with reduced cortical metabolism in any brain regions and inconsistent with Alzheimer's type pathology.  The patient's performance on objective neuropsychological evaluation found only 1 individual cognitive domain being impacted and that had to do with auditory and visual encoding and even that was relatively mild.  The patient is maintaining visual-spatial and visual processing capacity, visual and auditory reasoning and problem-solving ability, excellent retention of information processing speed and well-preserved expressive and receptive language components.  As far as diagnostic considerations, I do think that the most likely feature that is playing a role in her day-to-day experiences of changes in cognitive abilities and memory/attention have to do with a combination of anxiety based symptomatology with PTSD symptoms noted, significant anxiety and worry and likely increased vigilance over her own capacity as she aided in care for both her parents who suffered progressive cognitive decline in their 18s or 60s.  Along with these anxiety based symptoms, which are noted to have particular deleterious impact on encoding and attentional capacities, is related to her poor sleep pattern.  The patient acknowledges that anxiety even plays a role in difficulties with onset of sleep but the patient is having sustained and ongoing sleep deprivation or reduced sleep capacity with delayed onset and difficulty going to sleep and then excessive daytime sleepiness with workup for narcolepsy/cataplexy.  The patient has experienced some significant negative content vivid dreams which may be associated with her PTSD symptoms as well and these negative experiences while sleeping may also play a role in her anxiety  developing and increasing around time for onset of sleep/somnolence.  As far as treatment recommendations, I do think that the primary focus should be looking for anything we could do to improve sleep hygiene and sleep patterns.  The patient is taking BuSpar without issue and this is a good direction to go in.  We could look at other pharmacological interventions around her anxiety and PTSD and the patient may benefit from therapeutic interventions around PTSD and anxiety to reduce the intensity of the symptoms during the day which may help with improving the content of her dreams.  Maintaining active physical activity can also be helpful as well.  I will sit down with the patient and go over specific recommendations for the patient herself to do outside of medical interventions and review the results of the evaluation.  We have a very good baseline assessment now and if the patient starts experiencing any further cognitive decline we have the ability to do repeat testing in roughly a year or 2 if warranted.  Diagnosis:    Subjective memory complaints  PTSD (post-traumatic stress disorder)  Anxiety and  depression  Excessive daytime sleepiness   _____________________ Chapman Commodore, Psy.D. Clinical Neuropsychologist

## 2023-09-10 ENCOUNTER — Telehealth (HOSPITAL_BASED_OUTPATIENT_CLINIC_OR_DEPARTMENT_OTHER): Payer: Self-pay

## 2023-09-10 ENCOUNTER — Other Ambulatory Visit (HOSPITAL_BASED_OUTPATIENT_CLINIC_OR_DEPARTMENT_OTHER): Payer: Self-pay

## 2023-09-10 MED ORDER — BUDESONIDE-FORMOTEROL FUMARATE 80-4.5 MCG/ACT IN AERO: 2.0000 | INHALATION_SPRAY | Freq: Four times a day (QID) | RESPIRATORY_TRACT | 2 refills | Status: DC | PRN

## 2023-09-10 NOTE — Telephone Encounter (Signed)
 Rx sent to pharmacy   Copied from CRM [id number]. Topic: Clinical - Prescription Issue >> Sep 10, 2023  8:31 AM Crist Dominion wrote: Reason for CRM: Patient sees Dr. Washington Hacker and is out of her budesonide -formoterol  (SYMBICORT ) 80-4.5 MCG/ACT inhaler - Patient states her pharmacy has sent refill requests with no response. Patient is requesting this medication to be filled by a different provider until Dr. Washington Hacker is back .

## 2023-09-13 ENCOUNTER — Encounter: Payer: Self-pay | Admitting: Psychology

## 2023-09-13 ENCOUNTER — Ambulatory Visit (HOSPITAL_BASED_OUTPATIENT_CLINIC_OR_DEPARTMENT_OTHER): Payer: Self-pay | Admitting: Pulmonary Disease

## 2023-09-13 ENCOUNTER — Encounter: Payer: Medicare HMO | Attending: Psychology | Admitting: Psychology

## 2023-09-13 DIAGNOSIS — R413 Other amnesia: Secondary | ICD-10-CM | POA: Diagnosis not present

## 2023-09-13 DIAGNOSIS — R4189 Other symptoms and signs involving cognitive functions and awareness: Secondary | ICD-10-CM | POA: Diagnosis not present

## 2023-09-13 DIAGNOSIS — G4719 Other hypersomnia: Secondary | ICD-10-CM | POA: Insufficient documentation

## 2023-09-13 DIAGNOSIS — F32A Depression, unspecified: Secondary | ICD-10-CM | POA: Insufficient documentation

## 2023-09-13 DIAGNOSIS — F4310 Post-traumatic stress disorder, unspecified: Secondary | ICD-10-CM | POA: Diagnosis not present

## 2023-09-13 DIAGNOSIS — F419 Anxiety disorder, unspecified: Secondary | ICD-10-CM | POA: Insufficient documentation

## 2023-09-13 NOTE — Telephone Encounter (Signed)
 Copied from CRM [id number]. Topic: Clinical - Red Word Triage >> Sep 13, 2023  8:17 AM Justina Oman C wrote: Red Word that prompted transfer to Nurse Triage: Patient [phone] states asthma worsen, coughing so much hard to sleep, using albuterol more, difficult to breath- shortness of breath. Patient had an upper respiratory infection 3 weeks ago, two rounds of antibiotics with urgent care and the breathing is not any better. Patient denies pain, fever, nor dizziness. Patient wants to be seen today.   [id number] [place], [place] - [address] [place] [address] Phone: [phone] Fax: [phone]  TRIAGE SUMMARY NOTE: Pt reporting that she has been having more SOB and coughing than usual since having a URI 3 weeks ago, has been on 2 rounds of antibx for URI, as well as a third concurrently for UTI. Pt reporting constant coughing and not getting enough air through lungs, cough starting to become productive to clear sputum, worsened this weekend to where she's been having to use her albuterol inhaler every 2 hours for relief. Pt confirms no chest pain, dizziness, weakness, fever, or wheezing more than her usual, and confirms BP "pretty good" this week. Advised pt be examined in next 4 hours, pt unable to do offered appts, advised pt call PCP for appt today, advised pt do rescue treatments with inhaler in meantime, go to ED if no improvement, call if worsening or new symptoms, sending HP message to pulm office for call back to pt with further recommendations. Please advise.  E2C2 Pulmonary Triage - Initial Assessment Questions "Chief Complaint (e.g., cough, sob, wheezing, fever, chills, sweat or additional symptoms) *Go to specific symptom protocol after initial questions. No chest pain, dizziness, weakness 3 rounds of antibx 3rd was for UTI Almost constant coughing When do symbicort  seems to make the coughing and breathing worse Take propranolol  which exacerbates asthma BP pretty good  this week Wheezing not any more than normal More of the coughing and shortness of breath, SOB with exertion and at rest Like breath gets stuck right at top of sternal notch Started getting productive, clear sputum at this time No fever  started singulair  then stopped because "couldn't take it" had increased irritability, chest pains  "How long have symptoms been present?" Started 3 weeks with the URI  MEDICINES:   "Have you used any OTC meds to help with symptoms?" Yes If yes, ask "What medications?" Afraid to do much of anything  "Have you used your inhalers/maintenance medication?" Yes If yes, "What medications?" Albuterol rescue inhaler, symbicort    If inhaler, ask "How many puffs and how often?" Note: Review instructions on medication in the chart. Past weekend really rough 4-6x/day, every 2 hours this past weekend  OXYGEN: "Do you wear supplemental oxygen?" No  "Do you monitor your oxygen levels?" Yes If yes, "What is your reading (oxygen level) today?" 97% HR 66  "What is your usual oxygen saturation reading?"  (Note: Pulmonary O2 sats should be 90% or greater) 96% up  Reason for Disposition  [1] Longstanding difficulty breathing (e.g., CHF, COPD, emphysema) AND [2] WORSE than normal  Answer Assessment - Initial Assessment Questions 6. CARDIAC HISTORY: "Do you have any history of heart disease?" (e.g., heart attack, angina, bypass surgery, angioplasty)      HTN 7. LUNG HISTORY: "Do you have any history of lung disease?"  (e.g., pulmonary embolus, asthma, emphysema)     asthma  Protocols used: Breathing Difficulty-A-AH

## 2023-09-13 NOTE — Progress Notes (Signed)
 Neuropsychological Evaluation   Patient:  Donna [name]   DOB: 30-Jun-1950  MR Number: [id number]  Location: [facility] [address] [place], [address] [place] [address] Dept: [phone]  Start: 2 PM End: 3 PM  Provider/Observer:     Marrion Sjogren PsyD  Chief Complaint:      Chief Complaint  Patient presents with   Memory Loss   Anxiety   Post-Traumatic Stress Disorder   Pain    09/13/2023 2 PM-3 PM: Today's visit was an in person visit that was conducted in my outpatient clinic office with the patient myself present.  Today we reviewed the results of the recent neuropsychological evaluation and I went into great detail regarding the results of the testing and how the diagnostic considerations were derived.  We also spent time focusing on individual coping strategies and adaptive strategies for the patient that were not addressed specifically in the formal report provided to her physician.  We worked on issues related to adjusting to her cognitive change and the fact that she does not have a pattern consistent with a progressive major neurocognitive disorder.  I have included a copy of the reason for service and the summary from the neuropsychological evaluation below for convenience and the patient's complete neuropsychological evaluation can be found in her EMR dated 09/01/2023.  Reason For Service:      Donna [name] is a 73 year old female referred for neuropsychological consultation due to concerns around memory changes that she has been noting slowly progressing over time.  The patient reports that she has been increasingly forgetful, inattentive and describes anxiety/racing thoughts that are interrupting of thought processes.  Patient's primary memory issues are associated with inattentiveness and often are exacerbated during times of worry, leaving her forgetful for in the moment  activities.  Anxiety has been a long-term issue and she has received care through the years, including the addition of BuSpar.  Patient describes difficulty with recall and word finding issues and trouble with retrieval of word meaning and having to look up words when she is reading.  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  Patient has also noted difficulties recalling people's names that she is acquainted with.  Patient does note a lot of family stressors have been going on recently.  The patient's younger brother has cancer and patient provided care for her mother.    Impression/Diagnosis:   The results of the current neuropsychological evaluation are very encouraging.  There is no pattern identified consistent with a progressive major neurocognitive disorder.  While the patient had some findings on her ATN profile it can be found with individuals with Alzheimer's type pathology her PET scan was not consistent with reduced cortical metabolism in any brain regions and inconsistent with Alzheimer's type pathology.  The patient's performance on objective neuropsychological evaluation found only 1 individual cognitive domain being impacted and that had to do with auditory and visual encoding and even that was relatively mild.  The patient is maintaining visual-spatial and visual processing capacity, visual and auditory reasoning and problem-solving ability, excellent retention of information processing speed and well-preserved expressive and receptive language components.  As far as diagnostic considerations, I do think that the most likely feature that is playing a role in her day-to-day experiences of changes in cognitive abilities and memory/attention have to do with a combination  of anxiety based symptomatology with PTSD symptoms noted, significant anxiety and worry and likely increased vigilance over her own capacity as she  aided in care for both her parents who suffered progressive cognitive decline in their 64s or 40s.  Along with these anxiety based symptoms, which are noted to have particular deleterious impact on encoding and attentional capacities, is related to her poor sleep pattern.  The patient acknowledges that anxiety even plays a role in difficulties with onset of sleep but the patient is having sustained and ongoing sleep deprivation or reduced sleep capacity with delayed onset and difficulty going to sleep and then excessive daytime sleepiness with workup for narcolepsy/cataplexy.  The patient has experienced some significant negative content vivid dreams which may be associated with her PTSD symptoms as well and these negative experiences while sleeping may also play a role in her anxiety developing and increasing around time for onset of sleep/somnolence.  As far as treatment recommendations, I do think that the primary focus should be looking for anything we could do to improve sleep hygiene and sleep patterns.  The patient is taking BuSpar without issue and this is a good direction to go in.  We could look at other pharmacological interventions around her anxiety and PTSD and the patient may benefit from therapeutic interventions around PTSD and anxiety to reduce the intensity of the symptoms during the day which may help with improving the content of her dreams.  Maintaining active physical activity can also be helpful as well.  I will sit down with the patient and go over specific recommendations for the patient herself to do outside of medical interventions and review the results of the evaluation.  We have a very good baseline assessment now and if the patient starts experiencing any further cognitive decline we have the ability to do repeat testing in roughly a year or 2 if warranted.  Diagnosis:    Subjective memory complaints  PTSD (post-traumatic stress disorder)  Anxiety and depression  Excessive  daytime sleepiness  Memory change   _____________________ Chapman Commodore, Psy.D. Clinical Neuropsychologist

## 2023-09-13 NOTE — Telephone Encounter (Signed)
**Note De-identified  [place] Obfuscation** Please advise 

## 2023-09-13 NOTE — Telephone Encounter (Signed)
 Can recommend next available acute visit appointment. Otherwise If nothing in the next 48 hours recommend urgent care. She should have an appointment on the books with our office with any provider since Dr. Washington Hacker is out of office.

## 2023-09-13 NOTE — Telephone Encounter (Signed)
ATC x1 LVM for patient to call our office back regarding prior message.  

## 2023-09-14 ENCOUNTER — Other Ambulatory Visit (HOSPITAL_BASED_OUTPATIENT_CLINIC_OR_DEPARTMENT_OTHER): Payer: Self-pay

## 2023-09-14 ENCOUNTER — Telehealth (HOSPITAL_BASED_OUTPATIENT_CLINIC_OR_DEPARTMENT_OTHER): Payer: Self-pay

## 2023-09-14 ENCOUNTER — Encounter: Payer: Self-pay | Admitting: Neurology

## 2023-09-14 NOTE — Telephone Encounter (Signed)
 Will you please advise I sent rx as it was written in the past under historical rx?   Copied from CRM [id number]. Topic: Clinical - Prescription Issue >> Sep 14, 2023 11:54 AM Donna [name] wrote: Reason for CRM: [facility] to advise this med is generally only 2 puffs every 12 hrs, as not every 6 as this is written. Pharmacy needs new Rx w/ new directions.  [id number] [place], [place] - [address] [phone]

## 2023-09-14 NOTE — Telephone Encounter (Signed)
 Are we talking about Symbicort  . Typically is Symbicort  2 puffs Twice daily   However [facility] note on 07/05/23 says Symbicort  2 puffs every 6hr As needed

## 2023-09-15 ENCOUNTER — Ambulatory Visit (HOSPITAL_BASED_OUTPATIENT_CLINIC_OR_DEPARTMENT_OTHER): Payer: Self-pay | Admitting: Pulmonary Disease

## 2023-09-15 NOTE — Telephone Encounter (Signed)
 Atc x2. Lmtcb. Sending Mychart message to pt then completing note per protocol.

## 2023-09-15 NOTE — Telephone Encounter (Signed)
 Noted.

## 2023-09-15 NOTE — Telephone Encounter (Signed)
 Yes Symbicort , I sent the med as per Dr Fayetta Hoose directions am I okay to call pharmacy and just tell them to fill as previously written by Dr Washington Hacker

## 2023-09-15 NOTE — Telephone Encounter (Signed)
 Yes , make sure PRN -Dr. Washington Hacker

## 2023-09-15 NOTE — Telephone Encounter (Signed)
 Pt has picked up rx already with directions as originally written

## 2023-09-15 NOTE — Telephone Encounter (Signed)
 Copied from CRM [id number]. Topic: Clinical - Red Word Triage >> Sep 15, 2023  3:12 PM Tyronne Galloway wrote: Red Word that prompted transfer to Nurse Triage: Pt is recovering from a respiratory virus and is still having shortness of breath, difficulty breathing, and coughing for about 4 weeks. Pt has asthma.  TRIAGE SUMMARY NOTE Nurse triaged this pt on 5/5 as well, pt reporting that pt feeling "about the same" as on 5/5, has been having "constant coughing" and SOB consistently for 4 weeks both at rest and with exertion, some relief with albuterol inhaler, cough productive for past few days to clear sputum. Per pt chart, office been in contact with pt since to get pt scheduled for appt. Pt confirms that she is using her albuterol inhaler every 4-6 hours, had been using every 2 hours over weekend, still feeling need to use inhaler that often but trying to use as prescribed. Pt reporting that she forgot the instructions for rescue treatments explained by this nurse on 5/5, reiterated can use inhaler 2-3x 20 min apart when find that using inhaler every 4-6 hours is not bringing much improvement, if no improvement from rescue treatments or if worsening then go to ED, call 911 if ever struggling to breathe. Pt verbalized understanding. Advised pt be examined in next 4 hours for symptoms, no pulm availability today, pt already tried to make appt with PCP but availability with them either, warm transferred to CAL for other appt options, advised pt go to UC today and go to ED if worsening. Pt verbalized understanding. CAL scheduled pt for tomorrow morning.  E2C2 Pulmonary Triage - Initial Assessment Questions "Chief Complaint (e.g., cough, sob, wheezing, fever, chills, sweat or additional symptoms) *Go to specific symptom protocol after initial questions. About the same as last we spoke Constant coughing SOB Still having clear sputum Backed off and now using albuterol inhaler every 4-6 hours, only used once today, had  been using every 2 hours over weekend, still feeling need to use that often but trying to use as prescribed Forgot rescue treatments explanation from this nurse on 5/5, nurse reiterated instructions No chest pain, dizziness, weakness, other symptoms  "How long have symptoms been present?" 4 weeks  "Have you used your inhalers/maintenance medication?" Yes If yes, "What medications?" Albuterol inhaler every 4-6 hours, have felt need to use more frequently like she had been over weekend  Reason for Disposition  [1] Longstanding difficulty breathing (e.g., CHF, COPD, emphysema) AND [2] WORSE than normal  Answer Assessment - Initial Assessment Questions 6. CARDIAC HISTORY: "Do you have any history of heart disease?" (e.g., heart attack, angina, bypass surgery, angioplasty)      HTN 7. LUNG HISTORY: "Do you have any history of lung disease?"  (e.g., pulmonary embolus, asthma, emphysema)     asthma  Protocols used: Breathing Difficulty-A-AH

## 2023-09-15 NOTE — Progress Notes (Unsigned)
 @Patient  ID: Donna [name], female    DOB: April 17, 1951, 73 y.o.   MRN: [id number]  No chief complaint on file.   Referring provider: Pete Brand, DO  HPI:  Reason for Visit: Asthma follow-up   Donna [name] is a 73 year old female former smoker with adult onset asthma, HTN, HLD, essential tremor, IBS, HLD who present for asthma follow-up   Initial consult 03/2022 She was diagnosed with asthma in 2020/2021 with methacholine test in [place]. Previously on Flovent  and Alvesco  but unable to tolerate due to nausea. She reports unable to take prednisone due severe irritability. She was last seen by [facility] for initial asthma evaluation in May 2023. On albuterol as needed for stable symptoms. Has previously tried ICS inhaler with no improvement.   Today she reports shortness of breath with walking. Also having non-productive cough. Denies wheezing. Uses albuterol prior to exercise. Cold air, cold food triggers and anxiety as well. Chemicals, strong odors will trigger. No recent exaerbations requiring treatment with antibiotics.  Has been evaluated by GI for reflux and negative. Previously had allergy  testing which is negative.    05/27/22 She was not able to tolerate Incruse due to urinary retention. She reports asthma attack associated with cold air and exercise. Used albuterol inhaler once every 2 weeks.   Faxed notes from Dr. Liane Redman and [facility] in 2021-2023 reviewed. Notes report +methacholine challenge with 21% decrease in FEV1 discussed on 12/13/19 visit. Previously on Flovent . Sleep study was ordered on 04/29/21 for snoring and frequent nocturnal awakeneings. Falls asleep in meetings and fatigued.   11/05/22 Since our last visit she was prescribed low dose symbicort  as needed. Very minimal symptoms since our last visit and was able to even walk in the morning however she recently went to [place] for a week in early-mid June. She reports increased cough and unsure what  triggered it but was exposed to [facility] and heat throughout the trip. Usually triggered by odors, chemicals, humidity. No illnesses the trip. Now using Symbicort  two puffs in the morning with some improvement.    07/05/23 Since our last visit she reports she has been coughing more frequently. Using her symbicort  two puffs in the morning. Wheezing at night. Previously used as needed. Scent, dust, humidity trigger her symptoms. Symptoms improve with warmer weather. She walks in the morning.   Intermittent asthma Acute bronchitis/asthma attack - improving without steroids --CONTINUE Symbicort  2 puffs every 6 hours as needed --CONTINUE Albuterol AS NEEDED for shortness of breath --Consider taking more consistently 2 puffs twice a day during winter months --Encourage regular aerobic exercise daily   Left lower lobe lung nodule, benign --No further follow-up     09/16/2023 Patient presents today for acute office visit.  She follows with Dr. Washington Hacker for history of mild intermittent asthma maintained on Symbicort  micrograms 2 puffs twice daily and as needed albuterol  Constant coughing and shortness of breath for 4 weeks both at rest and with exertion  Using Albuterol inhaler every 4-6 hours   ? Zpack in April   Allergies  Allergen Reactions   Ciprofloxacin     Neuropathy    Corticosteroids Other (See Comments)    Unable to take oral, topical or injectable    Doxycycline Nausea Only   Gluten Meal     GI upset    Incruse Ellipta  [Umeclidinium Bromide ]     Urinary retention   Montelukast  Other (See Comments)   Penicillins Nausea And Vomiting    "breaking  out"   Prednisone Other (See Comments)    Hyperactivity, mood changes, nausea   Quinolones Other (See Comments)    REACTION: sick / neuropathy    Statins Other (See Comments)    Immunization History  Administered Date(s) Administered   Influenza,trivalent, recombinat, inj, PF 02/16/2014   Influenza-Unspecified 02/22/2014, 02/05/2015,  02/05/2015   PFIZER Comirnaty(Gray Top)Covid-19 Tri-Sucrose Vaccine 06/11/2020   PFIZER(Purple Top)SARS-COV-2 Vaccination 07/09/2019, 08/02/2019, 12/28/2019   Tdap 05/26/2007, 07/12/2018    Past Medical History:  Diagnosis Date   Anxiety    Asthma    Basal cell carcinoma 06/29/2017   right lip - CX3 + 5FU   BCC (basal cell carcinoma of skin) 10/29/2022   left lower leg, posterior - Mohs 01/27/2023   Cataract    bilateral lens implants   Cough variant asthma    Depression    GERD (gastroesophageal reflux disease)    Heart murmur    Hiatal hernia    Hyperlipidemia    IBS (irritable bowel syndrome)    SCC (squamous cell carcinoma) 10/29/2022   right lower leg, anterior - Mohs 12/28/2022 Dr Gaylyn Keas   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     azithromycin  (ZITHROMAX ) 250 MG tablet Take first 2 tablets together, then 1 every day until finished. 6 tablet 0   b complex vitamins tablet Take 1 tablet by mouth daily.       benzonatate  (TESSALON ) 100 MG capsule Take 1 capsule (100 mg total) by mouth 3 (three) times daily as needed for cough. 21 capsule 0   budesonide -formoterol  (SYMBICORT ) 80-4.5 MCG/ACT inhaler Inhale 2 puffs into the lungs every 6 (six) hours as needed. 10.2 each 2   calcium  gluconate 500 MG tablet Take 500 mg by mouth daily.     cefUROXime  (CEFTIN ) 250 MG tablet Take 1 tablet (250 mg  total) by mouth 2 (two) times daily with a meal. 14 tablet 0   ciclopirox (PENLAC) 8 % solution Apply 1 Application topically daily. For the toenail     diclofenac Sodium (VOLTAREN) 1 % GEL Apply 2 g topically daily as needed.     Efinaconazole  (JUBLIA ) 10 % SOLN Apply 1 application  topically at bedtime. 8 mL 11   Estradiol  (YUVAFEM ) 10 MCG TABS vaginal tablet Place 1 tablet vaginally once a week. On Sundays     lisinopril  (ZESTRIL ) 5 MG tablet TAKE 1 TABLET BY MOUTH DAILY FOR ELEVATED BP WHILE TRAVELING 90 tablet 1   loratadine (CLARITIN) 10 MG tablet Take 10 mg by mouth daily as needed (allergy  season).     Magnesium 500 MG TABS Take 1 tablet by mouth daily.       metoprolol  tartrate (LOPRESSOR ) 25 MG tablet Take 0.5 tablets (12.5 mg total) by mouth every 12 (twelve) hours as needed (for palpitations or increased HR > 100).  30 tablet 6   montelukast  (SINGULAIR ) 10 MG tablet Take 1 tablet (10 mg total) by mouth at bedtime. 30 tablet 2   Multiple Vitamin (MULTIVITAMIN) capsule Take 1 capsule by mouth daily.       promethazine -dextromethorphan (PROMETHAZINE -DM) 6.25-15 MG/5ML syrup Take 5 mLs by mouth 3 (three) times daily as needed for cough. 118 mL 0   propranolol  (INDERAL ) 10 MG tablet Take 10 mg by mouth as needed.     valACYclovir  (VALTREX ) 500 MG tablet ONE BY MOUTH TWICE A DAY X 5 DAYS FOR HERPES OUTBREAK 10 tablet prn   No facility-administered medications prior to visit.      Review of Systems  Review of Systems   Physical Exam  There were no vitals taken for this visit. Physical Exam   Lab Results:  CBC    Component Value Date/Time   WBC 4.1 08/01/2023 0942   RBC 3.92 08/01/2023 0942   HGB 12.2 08/01/2023 0942   HCT 34.3 (L) 08/01/2023 0942   PLT 239 08/01/2023 0942   MCV 87.5 08/01/2023 0942   MCH 31.1 08/01/2023 0942   MCHC 35.6 08/01/2023 0942   RDW 12.7 08/01/2023 0942   LYMPHSABS 1.1 08/01/2023 0942   MONOABS 0.5 08/01/2023 0942   EOSABS 0.1 08/01/2023  0942   BASOSABS 0.0 08/01/2023 0942    BMET    Component Value Date/Time   NA 132 (L) 08/01/2023 0942   NA 137 07/19/2023 0810   K 4.0 08/01/2023 0942   CL 101 08/01/2023 0942   CO2 22 08/01/2023 0942   GLUCOSE 107 (H) 08/01/2023 0942   BUN 12 08/01/2023 0942   BUN 10 07/19/2023 0810   CREATININE 0.61 08/01/2023 0942   CREATININE 0.73 07/02/2020 0924   CALCIUM  8.8 (L) 08/01/2023 0942   GFRNONAA >60 08/01/2023 0942   GFRNONAA 85 07/02/2020 0924   GFRAA 98 07/02/2020 0924    BNP    Component Value Date/Time   BNP 132.3 (H) 08/01/2023 0942    ProBNP No results found for: "PROBNP"  Imaging: DG Chest 2 View Result Date: 08/27/2023 CLINICAL DATA:  cough worsening on antibiotics, exposure PNA EXAM: CHEST - 2 VIEW COMPARISON:  08/21/2023. FINDINGS: Cardiac silhouette is unremarkable. No pneumothorax or pleural effusion. The lungs are clear. Aorta is calcified. The visualized skeletal structures are unremarkable. IMPRESSION: No acute cardiopulmonary process. Electronically Signed   By: Sydell Eva M.D.   On: 08/27/2023 09:21   DG Chest 2 View Result Date: 08/21/2023 CLINICAL DATA:  Cough, dyspnea for 5 days EXAM: CHEST - 2 VIEW COMPARISON:  None Available. FINDINGS: Normal mediastinum and cardiac silhouette. Normal pulmonary vasculature. No evidence of effusion, infiltrate, or pneumothorax. No acute bony abnormality. IMPRESSION: No acute cardiopulmonary process. Electronically Signed   By: Deboraha Fallow M.D.   On: 08/21/2023 12:35     Assessment & Plan:   No problem-specific Assessment & Plan notes found for this encounter.     Antonio Baumgarten, NP 09/15/2023

## 2023-09-16 ENCOUNTER — Encounter: Payer: Self-pay | Admitting: Primary Care

## 2023-09-16 ENCOUNTER — Ambulatory Visit: Admitting: Primary Care

## 2023-09-16 ENCOUNTER — Other Ambulatory Visit: Payer: Self-pay | Admitting: Primary Care

## 2023-09-16 VITALS — BP 143/81 | HR 65 | Temp 97.7°F | Ht 67.0 in | Wt 156.8 lb

## 2023-09-16 DIAGNOSIS — B349 Viral infection, unspecified: Secondary | ICD-10-CM

## 2023-09-16 DIAGNOSIS — J4521 Mild intermittent asthma with (acute) exacerbation: Secondary | ICD-10-CM | POA: Diagnosis not present

## 2023-09-16 MED ORDER — PROMETHAZINE-DM 6.25-15 MG/5ML PO SYRP: 5.0000 mL | ORAL_SOLUTION | Freq: Four times a day (QID) | ORAL | 0 refills | Status: DC | PRN

## 2023-09-16 MED ORDER — SPIRIVA RESPIMAT 1.25 MCG/ACT IN AERS: 2.0000 | INHALATION_SPRAY | Freq: Every day | RESPIRATORY_TRACT | 1 refills | Status: DC

## 2023-09-16 NOTE — Patient Instructions (Addendum)
-  ASTHMA WITH MILD OBSTRUCTIVE LUNG DISEASE: Asthma is a condition where your airways become inflamed and narrow, making it hard to breathe. Your recent respiratory infection has worsened your asthma symptoms. We will increase your Symbicort  to twice daily during this flare-up and add Spiriva Respimat to help open your airways without using steroids. We will discontinue Singulair  due to mood changes and switch from Claritin to either Allegra or Zyrtec. Promethazine  DM will be refilled for your cough, and albuterol should be used only in emergencies. Please monitor for any urinary retention with Spiriva and stop if symptoms occur. Drink plenty of fluids to help prevent this side effect. We will also discuss potential biologic treatments with your allergist if your asthma remains uncontrolled.  -ESSENTIAL TREMOR: Essential tremor is a nervous system disorder that causes involuntary and rhythmic shaking. Your tremors are worsened by the steroids in your asthma medication. Once your asthma symptoms are under control, we can consider resuming Symbicort .  INSTRUCTIONS: Please follow up with your allergist to discuss potential biologic treatments for your asthma. Monitor for any urinary retention with Spiriva and stop if symptoms occur. Drink plenty of fluids to help prevent this side effect.  Follow-up: 3-4 months with Dr. Washington Hacker or sooner if needed

## 2023-09-17 MED ORDER — UMECLIDINIUM BROMIDE 62.5 MCG/ACT IN AEPB: 1.0000 | INHALATION_SPRAY | Freq: Every day | RESPIRATORY_TRACT | 1 refills | Status: DC

## 2023-09-17 NOTE — Telephone Encounter (Signed)
 Sent to pharmacy, let me know how she does with new medication

## 2023-09-17 NOTE — Addendum Note (Signed)
 Addended by: Antonio Baumgarten on: 09/17/2023 08:34 AM   Modules accepted: Orders

## 2023-09-20 ENCOUNTER — Encounter: Payer: Self-pay | Admitting: Dermatology

## 2023-09-20 ENCOUNTER — Ambulatory Visit (INDEPENDENT_AMBULATORY_CARE_PROVIDER_SITE_OTHER): Admitting: Dermatology

## 2023-09-20 VITALS — BP 127/68 | HR 70

## 2023-09-20 DIAGNOSIS — D485 Neoplasm of uncertain behavior of skin: Secondary | ICD-10-CM

## 2023-09-20 DIAGNOSIS — C44722 Squamous cell carcinoma of skin of right lower limb, including hip: Secondary | ICD-10-CM | POA: Diagnosis not present

## 2023-09-20 DIAGNOSIS — D492 Neoplasm of unspecified behavior of bone, soft tissue, and skin: Secondary | ICD-10-CM | POA: Diagnosis not present

## 2023-09-20 NOTE — Progress Notes (Signed)
   Follow-Up Visit   Subjective  Donna [name] is a 73 y.o. female who presents for the following: growth Pt has growth on right lower leg for around 1 month . It has gotten larger since first noticed. Was seen by Dr. Myrtie Atkinson for a TBSE on 08/12/2023; and lesion has grown since then; tender, not previously treated.   The following portions of the chart were reviewed this encounter and updated as appropriate: medications, allergies, medical history  Review of Systems:  No other skin or systemic complaints except as noted in HPI or Assessment and Plan.  Objective  Well appearing patient in no apparent distress; mood and affect are within normal limits.  A focused examination was performed of the following areas: Right lower leg  Relevant exam findings are noted in the Assessment and Plan.  Right Lower Leg - Posterior 6mm hyperkeratotic papule   Assessment & Plan    NEOPLASM OF UNCERTAIN BEHAVIOR OF SKIN Right Lower Leg - Posterior Skin / nail biopsy Type of biopsy: tangential   Informed consent: discussed and consent obtained   Timeout: patient name, date of birth, surgical site, and procedure verified   Procedure prep:  Patient was prepped and draped in usual sterile fashion Prep type:  Isopropyl alcohol Anesthesia: the lesion was anesthetized in a standard fashion   Anesthetic:  1% lidocaine  w/ epinephrine  1-100,000 buffered w/ 8.4% NaHCO3 Instrument used: DermaBlade   Hemostasis achieved with: pressure and aluminum chloride   Outcome: patient tolerated procedure well   Post-procedure details: sterile dressing applied and wound care instructions given   Dressing type: bandage and pressure dressing   Specimen 1 - Surgical pathology Differential Diagnosis: R/O NMSC  Check Margins: No  Return for based on biopsy results.  I, Wilson Hasten, CMA, am acting as scribe for Deneise Finlay, MD.   Documentation: I have reviewed the above documentation for accuracy and  completeness, and I agree with the above.  Deneise Finlay, MD

## 2023-09-20 NOTE — Patient Instructions (Addendum)

## 2023-09-21 ENCOUNTER — Other Ambulatory Visit: Payer: Self-pay

## 2023-09-21 ENCOUNTER — Encounter: Payer: Self-pay | Admitting: Allergy & Immunology

## 2023-09-21 ENCOUNTER — Ambulatory Visit: Admitting: Allergy & Immunology

## 2023-09-21 VITALS — BP 116/62 | HR 70 | Temp 98.2°F | Resp 19 | Ht 66.54 in | Wt 156.0 lb

## 2023-09-21 DIAGNOSIS — K9041 Non-celiac gluten sensitivity: Secondary | ICD-10-CM | POA: Diagnosis not present

## 2023-09-21 DIAGNOSIS — J4530 Mild persistent asthma, uncomplicated: Secondary | ICD-10-CM

## 2023-09-21 DIAGNOSIS — J310 Chronic rhinitis: Secondary | ICD-10-CM

## 2023-09-21 NOTE — Patient Instructions (Addendum)
 Asthma:  - Lung testing looks pretty good today.  - I would just continue with the Symbicort  two puffs twice daily.  Daily controller medication(s): NOTHING FOR NOW May use levoalbuterol rescue inhaler 2 puffs every 4 to 6 hours as needed for shortness of breath, chest tightness, coughing, and wheezing. May use levoalbuterol rescue inhaler 2 puffs 5 to 15 minutes prior to strenuous physical activities. Monitor frequency of use.  Asthma control goals:  Full participation in all desired activities (may need albuterol before activity) Albuterol use two times or less a week on average (not counting use with activity) Cough interfering with sleep two times or less a month Oral steroids no more than once a year No hospitalizations   Chronic Rhinitis:(negative skin testing in 2021 at previous allergist) We are going to get some blood work to see if you have any evidence of allergies.  If this is negative, we will do more sensitive skin testing.  May use nasal saline spray (i.e., Simply Saline) as needed.  We can talk allergy  shots once we have your results back.   Gluten intolerance: Continue to avoid gluten. For mild symptoms you can take over the counter antihistamines such as Benadryl  and monitor symptoms closely. If symptoms worsen or if you have severe symptoms including breathing issues, throat closure, significant swelling, whole body hives, severe diarrhea and vomiting, lightheadedness then seek immediate medical care.  Return in about 4 weeks (around 10/19/2023) for INTRADERMAL TESTING.    Please inform us  of any Emergency Department visits, hospitalizations, or changes in symptoms. Call us  before going to the ED for breathing or allergy  symptoms since we might be able to fit you in for a sick visit. Feel free to contact us  anytime with any questions, problems, or concerns.  It was a pleasure to see you again today!  Websites that have reliable patient information: 1. American Academy  of Asthma, Allergy , and Immunology: www.aaaai.org 2. Food Allergy  Research and Education (FARE): foodallergy.org 3. Mothers of Asthmatics: http://www.asthmacommunitynetwork.org 4. American College of Allergy , Asthma, and Immunology: www.acaai.org   COVID-19 Vaccine Information can be found at: [email or web address] For questions related to vaccine distribution or appointments, please email [email or web address] or call [phone].   We realize that you might be concerned about having an allergic reaction to the COVID19 vaccines. To help with that concern, WE ARE OFFERING THE COVID19 VACCINES IN OUR OFFICE! Ask the front desk for dates!     "Like" us  on Facebook and Instagram for our latest updates!      A healthy democracy works best when [employer] participate! Make sure you are registered to vote! If you have moved or changed any of your contact information, you will need to get this updated before voting!  In some cases, you MAY be able to register to vote online: [email or web address]    Allergy  Shots  Allergies are the result of a chain reaction that starts in the immune system. Your immune system controls how your body defends itself. For instance, if you have an allergy  to pollen, your immune system identifies pollen as an invader or allergen. Your immune system overreacts by producing antibodies called Immunoglobulin E (IgE). These antibodies travel to cells that release chemicals, causing an allergic reaction.  The concept behind allergy  immunotherapy, whether it is received in the form of shots or tablets, is that the immune system can be desensitized to specific allergens that trigger allergy  symptoms. Although it requires time and patience, the payback can be long-term relief. Allergy   injections contain a dilute solution of those substances that you are allergic to based upon your skin testing  and allergy  history.   How Do Allergy  Shots Work?  Allergy  shots work much like a vaccine. Your body responds to injected amounts of a particular allergen given in increasing doses, eventually developing a resistance and tolerance to it. Allergy  shots can lead to decreased, minimal or no allergy  symptoms.  There generally are two phases: build-up and maintenance. Build-up often ranges from three to six months and involves receiving injections with increasing amounts of the allergens. The shots are typically given once or twice a week, though more rapid build-up schedules are sometimes used.  The maintenance phase begins when the most effective dose is reached. This dose is different for each person, depending on how allergic you are and your response to the build-up injections. Once the maintenance dose is reached, there are longer periods between injections, typically two to four weeks.  Occasionally doctors give cortisone-type shots that can temporarily reduce allergy  symptoms. These types of shots are different and should not be confused with allergy  immunotherapy shots.  Who Can Be Treated with Allergy  Shots?  Allergy  shots may be a good treatment approach for people with allergic rhinitis (hay fever), allergic asthma, conjunctivitis (eye allergy ) or stinging insect allergy .   Before deciding to begin allergy  shots, you should consider:   The length of allergy  season and the severity of your symptoms  Whether medications and/or changes to your environment can control your symptoms  Your desire to avoid long-term medication use  Time: allergy  immunotherapy requires a major time commitment  Cost: may vary depending on your insurance coverage  Allergy  shots for children age 72 and older are effective and often well tolerated. They might prevent the onset of new allergen sensitivities or the progression to asthma.  Allergy  shots are not started on patients who are pregnant but can be  continued on patients who become pregnant while receiving them. In some patients with other medical conditions or who take certain common medications, allergy  shots may be of risk. It is important to mention other medications you talk to your allergist.   What are the two types of build-ups offered:   RUSH or Rapid Desensitization -- one day of injections lasting from 8:30-4:30pm, injections every 1 hour.  Approximately half of the build-up process is completed in that one day.  The following week, normal build-up is resumed, and this entails ~16 visits either weekly or twice weekly, until reaching your "maintenance dose" which is continued weekly until eventually getting spaced out to every month for a duration of 3 to 5 years. The regular build-up appointments are nurse visits where the injections are administered, followed by required monitoring for 30 minutes.    Traditional build-up -- weekly visits for 6 -12 months until reaching "maintenance dose", then continue weekly until eventually spacing out to every 4 weeks as above. At these appointments, the injections are administered, followed by required monitoring for 30 minutes.     Either way is acceptable, and both are equally effective. With the rush protocol, the advantage is that less time is spent here for injections overall AND you would also reach maintenance dosing faster (which is when the clinical benefit starts to become more apparent). Not everyone is a candidate for rapid desensitization.   IF we proceed with the RUSH protocol, there are premedications which must be taken the day before and the day after the rush only (this includes  antihistamines, steroids, and Singulair ).  After the rush day, no prednisone or Singulair  is required, and we just recommend antihistamines taken on your injection day.  What Is An Estimate of the Costs?  If you are interested in starting allergy  injections, please check with your insurance company about  your coverage for both allergy  vial sets and allergy  injections.  Please do so prior to making the appointment to start injections.  The following are CPT codes to give to your insurance company. These are the amounts we BILL to the insurance company, but the amount YOU WILL PAY and WE RECEIVE IS SUBSTANTIALLY LESS and depends on the contracts we have with different insurance companies.   Amount Billed to Insurance One allergy  vial set  CPT 95165   $ 1200     Two allergy  vial set  CPT 95165   $ 2400     Three allergy  vial set  CPT 95165   $ 3600     One injection   CPT 95115   $ 35  Two injections   CPT 95117   $ 40 RUSH (Rapid Desensitization) CPT 95180 x 8 hours $500/hour  Regarding the allergy  injections, your co-pay may or may not apply with each injection, so please confirm this with your insurance company. When you start allergy  injections, 1 or 2 sets of vials are made based on your allergies.  Not all patients can be on one set of vials. A set of vials lasts 6 months to a year depending on how quickly you can proceed with your build-up of your allergy  injections. Vials are personalized for each patient depending on their specific allergens.  How often are allergy  injection given during the build-up period?   Injections are given at least weekly during the build-up period until your maintenance dose is achieved. Per the doctor's discretion, you may have the option of getting allergy  injections two times per week during the build-up period. However, there must be at least 48 hours between injections. The build-up period is usually completed within 6-12 months depending on your ability to schedule injections and for adjustments for reactions. When maintenance dose is reached, your injection schedule is gradually changed to every two weeks and later to every three weeks. Injections will then continue every 4 weeks. Usually, injections are continued for a total of 3-5 years.   When Will I Feel  Better?  Some may experience decreased allergy  symptoms during the build-up phase. For others, it may take as long as 12 months on the maintenance dose. If there is no improvement after a year of maintenance, your allergist will discuss other treatment options with you.  If you aren't responding to allergy  shots, it may be because there is not enough dose of the allergen in your vaccine or there are missing allergens that were not identified during your allergy  testing. Other reasons could be that there are high levels of the allergen in your environment or major exposure to non-allergic triggers like tobacco smoke.  What Is the Length of Treatment?  Once the maintenance dose is reached, allergy  shots are generally continued for three to five years. The decision to stop should be discussed with your allergist at that time. Some people may experience a permanent reduction of allergy  symptoms. Others may relapse and a longer course of allergy  shots can be considered.  What Are the Possible Reactions?  The two types of adverse reactions that can occur with allergy  shots are local and systemic. Common local  reactions include very mild redness and swelling at the injection site, which can happen immediately or several hours after. Report a delayed reaction from your last injection. These include arm swelling or runny nose, watery eyes or cough that occurs within 12-24 hours after injection. A systemic reaction, which is less common, affects the entire body or a particular body system. They are usually mild and typically respond quickly to medications. Signs include increased allergy  symptoms such as sneezing, a stuffy nose or hives.   Rarely, a serious systemic reaction called anaphylaxis can develop. Symptoms include swelling in the throat, wheezing, a feeling of tightness in the chest, nausea or dizziness. Most serious systemic reactions develop within 30 minutes of allergy  shots. This is why it is  strongly recommended you wait in your doctor's office for 30 minutes after your injections. Your allergist is trained to watch for reactions, and his or her staff is trained and equipped with the proper medications to identify and treat them.   Report to the nurse immediately if you experience any of the following symptoms: swelling, itching or redness of the skin, hives, watery eyes/nose, breathing difficulty, excessive sneezing, coughing, stomach pain, diarrhea, or light headedness. These symptoms may occur within 15-20 minutes after injection and may require medication.   Who Should Administer Allergy  Shots?  The preferred location for receiving shots is your prescribing allergist's office. Injections can sometimes be given at another facility where the physician and staff are trained to recognize and treat reactions, and have received instructions by your prescribing allergist.  What if I am late for an injection?   Injection dose will be adjusted depending upon how many days or weeks you are late for your injection.   What if I am sick?   Please report any illness to the nurse before receiving injections. She may adjust your dose or postpone injections depending on your symptoms. If you have fever, flu, sinus infection or chest congestion it is best to postpone allergy  injections until you are better. Never get an allergy  injection if your asthma is causing you problems. If your symptoms persist, seek out medical care to get your health problem under control.  What If I am or Become Pregnant:  Women that become pregnant should schedule an appointment with [facility] before receiving any further allergy  injections.

## 2023-09-21 NOTE — Progress Notes (Unsigned)
 FOLLOW UP  Date of Service/Encounter:  09/21/23   Assessment:   Mild persistent asthma, uncomplicated   Non-allergic rhinitis   Gluten intolerance   Essential tremors - still controlled despite stopping propanolol    Plan/Recommendations:   Asthma:  - Lung testing looks pretty good today.  - I would just continue with the Symbicort  two puffs twice daily.  Daily controller medication(s): NOTHING FOR NOW May use levoalbuterol rescue inhaler 2 puffs every 4 to 6 hours as needed for shortness of breath, chest tightness, coughing, and wheezing. May use levoalbuterol rescue inhaler 2 puffs 5 to 15 minutes prior to strenuous physical activities. Monitor frequency of use.  Asthma control goals:  Full participation in all desired activities (may need albuterol before activity) Albuterol use two times or less a week on average (not counting use with activity) Cough interfering with sleep two times or less a month Oral steroids no more than once a year No hospitalizations   Chronic Rhinitis:(negative skin testing in 2021 at previous allergist) We are going to get some blood work to see if you have any evidence of allergies.  If this is negative, we will do more sensitive skin testing.  May use nasal saline spray (i.e., Simply Saline) as needed.  We can talk allergy  shots once we have your results back.   Gluten intolerance: Continue to avoid gluten. For mild symptoms you can take over the counter antihistamines such as Benadryl  and monitor symptoms closely. If symptoms worsen or if you have severe symptoms including breathing issues, throat closure, significant swelling, whole body hives, severe diarrhea and vomiting, lightheadedness then seek immediate medical care.  Return in about 4 weeks (around 10/19/2023) for INTRADERMAL TESTING.    Subjective:   Donna [name] is a 73 y.o. female presenting today for follow up of  Chief Complaint  Patient presents with   Allergies    Asthma    States that in first weekend in April she and husband was diagnosed with respiratory virus she had two rounds of antibotics    Cough    Donna [name] has a history of the following: Patient Active Problem List   Diagnosis Date Noted   Exposure to hepatitis B 07/01/2023   Need for immunization against viral hepatitis 07/01/2023   Screening for STDs (sexually transmitted diseases) 07/01/2023   History of substance use 07/01/2023   S/P appendectomy 05/11/2023   S/P laparoscopic appendectomy 05/11/2023   Gasping for breath 08/19/2021   PTSD (post-traumatic stress disorder) 08/19/2021   Parasomnia overlap disorder 08/19/2021   Snoring 08/19/2021   Excessive daytime sleepiness 08/19/2021   Sleep paralysis, recurrent isolated 08/19/2021   Asthma 02/19/2021   Multiple drug allergies 02/19/2021   Gluten intolerance 02/19/2021   Chronic rhinitis 02/19/2021   Metatarsalgia of left foot 11/14/2020   Achilles tendinosis of right lower extremity 11/14/2020   Essential tremor 07/04/2019   DOE (dyspnea on exertion) 01/20/2019   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 06/13/2018   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   Superficial basal cell carcinoma 06/13/2018   Upper airway cough syndrome 06/13/2018   Atrophic vaginitis 02/17/2018   Bilateral leg cramps 02/17/2018   Fatty liver, alcoholic 04/22/2016   Depression 04/19/2016   Withdrawal complaint 04/19/2016   Hx of adenomatous colonic polyps 08/03/2015   Herpes simplex 06/15/2014   GERD (gastroesophageal reflux disease) 10/14/2013   Anxiety state 10/14/2013   Recovering alcoholic in remission (HCC) 10/14/2013  Allergic rhinitis 10/14/2013   Adjustment disorder with anxiety 10/14/2013   Elevated low-density lipoprotein level 10/14/2013   Essential hypertension 10/15/2012   Heart murmur 05/25/2012    History obtained from: chart review and {Persons; PED relatives  w/patient:19415::"patient"}.  Discussed the use of AI scribe software for clinical note transcription with the patient and/or guardian, who gave verbal consent to proceed.  Donna [name] is a 73 y.o. female presenting for {Blank single:19197::"a food challenge","a drug challenge","skin testing","a sick visit","an evaluation of ***","a follow up visit"}.  She was last seen in May 2023.  At that time, I met her for the first time.  Lung testing looked a bit lower.  We continued her with Xopenex  as needed.  For her nonallergic rhinitis, we continued with an antihistamine as needed as well is nasal saline.  She continues to avoid gluten.  Since last visit,  Asthma/Respiratory Symptom History: ***  Allergic Rhinitis Symptom History: ***  Food Allergy  Symptom History: ***  Skin Symptom History: ***  GERD Symptom History: ***  Infection Symptom History: ***  Otherwise, there have been no changes to her past medical history, surgical history, family history, or social history.    Review of systems otherwise negative other than that mentioned in the HPI.    Objective:   Blood pressure 116/62, pulse 70, temperature 98.2 F (36.8 C), temperature source Temporal, resp. rate 19, height 5' 6.54" (1.69 m), weight 156 lb (70.8 kg), SpO2 96%. Body mass index is 24.78 kg/m.    Physical Exam   Diagnostic studies:    Spirometry: results normal (FEV1: 1.76, FVC: 2.42, FEV1/FVC: 73%).    Spirometry consistent with normal pattern. {Blank single:19197::"Albuterol/Atrovent nebulizer","Xopenex /Atrovent nebulizer","Albuterol nebulizer","Albuterol four puffs via MDI","Xopenex  four puffs via MDI"} treatment given in clinic with {Blank single:19197::"significant improvement in FEV1 per ATS criteria","significant improvement in FVC per ATS criteria","significant improvement in FEV1 and FVC per ATS criteria","improvement in FEV1, but not significant per ATS criteria","improvement in FVC, but not significant per  ATS criteria","improvement in FEV1 and FVC, but not significant per ATS criteria","no improvement"}.  Allergy  Studies: {Blank single:19197::"none","deferred due to recent antihistamine use","deferred due to insurance stipulations that require a separate visit for testing","labs sent instead"," "}    {Blank single:19197::"Allergy  testing results were read and interpreted by myself, documented by clinical staff."," "}      Drexel Gentles, MD  [facility] 

## 2023-09-22 MED ORDER — BUDESONIDE-FORMOTEROL FUMARATE 80-4.5 MCG/ACT IN AERO: 2.0000 | INHALATION_SPRAY | Freq: Four times a day (QID) | RESPIRATORY_TRACT | 2 refills | Status: AC | PRN

## 2023-09-22 MED ORDER — UMECLIDINIUM BROMIDE 62.5 MCG/ACT IN AEPB: 1.0000 | INHALATION_SPRAY | Freq: Every day | RESPIRATORY_TRACT | 1 refills | Status: DC

## 2023-09-22 NOTE — Telephone Encounter (Signed)
 When prescription was sent it said "phoned in" Resent as electronic prescription

## 2023-09-22 NOTE — Telephone Encounter (Signed)
 Yes [employer], I resent. It said it had been phoned in and was likely why it was not received.

## 2023-09-23 ENCOUNTER — Encounter: Payer: Self-pay | Admitting: Allergy & Immunology

## 2023-09-23 LAB — ALLERGENS W/COMP RFLX AREA 2
Alternaria Alternata IgE: <0.1 kU/L
Aspergillus Fumigatus IgE: <0.1 kU/L
Bermuda Grass IgE: <0.1 kU/L
Cedar, Mountain IgE: <0.1 kU/L
Cladosporium Herbarum IgE: <0.1 kU/L
Cockroach, German IgE: <0.1 kU/L
Common Silver Birch IgE: <0.1 kU/L
Cottonwood IgE: <0.1 kU/L
D Farinae IgE: <0.1 kU/L
D Pteronyssinus IgE: <0.1 kU/L
E001-IgE Cat Dander: <0.1 kU/L
E005-IgE Dog Dander: <0.1 kU/L
Elm, American IgE: <0.1 kU/L
IgE (Immunoglobulin E), Serum: 36 [IU]/mL (ref 6–495)
Johnson Grass IgE: <0.1 kU/L
Maple/Box Elder IgE: <0.1 kU/L
Mouse Urine IgE: <0.1 kU/L
Oak, White IgE: <0.1 kU/L
Pecan, Hickory IgE: <0.1 kU/L
Penicillium Chrysogen IgE: <0.1 kU/L
Pigweed, Rough IgE: <0.1 kU/L
Ragweed, Short IgE: <0.1 kU/L
Sheep Sorrel IgE Qn: <0.1 kU/L
Timothy Grass IgE: <0.1 kU/L
White Mulberry IgE: <0.1 kU/L

## 2023-09-23 LAB — SURGICAL PATHOLOGY

## 2023-09-24 ENCOUNTER — Ambulatory Visit: Payer: Self-pay | Admitting: Dermatology

## 2023-09-24 ENCOUNTER — Encounter: Payer: Self-pay | Admitting: Sports Medicine

## 2023-09-24 ENCOUNTER — Ambulatory Visit: Payer: Self-pay | Admitting: Allergy & Immunology

## 2023-09-24 ENCOUNTER — Ambulatory Visit: Admitting: Sports Medicine

## 2023-09-24 DIAGNOSIS — M25552 Pain in left hip: Secondary | ICD-10-CM

## 2023-09-24 DIAGNOSIS — M7632 Iliotibial band syndrome, left leg: Secondary | ICD-10-CM | POA: Diagnosis not present

## 2023-09-24 NOTE — Progress Notes (Signed)
 Patient says that she got great relief from the shockwave therapy and physical therapy. She has continued to do her exercises and she is still doing yoga. She says that in the last couple of weeks she has begun having pain again in the left leg. She has not had much pain or discomfort in the hip, but about halfway down the leg over the IT band she is very tender, and she is beginning to have some discomfort over the lateral aspect of the knee. She says that over the last couple of weeks, she has noticed increased aching. She has not taken any medication, but is using a massage gun over the lateral left leg. She has a trip in a couple of weeks where she will be hiking a lot and doing yoga, and is hopeful to feel well for that.

## 2023-09-24 NOTE — Progress Notes (Signed)
 Donna [name] - 73 y.o. female MRN [id number]  Date of birth: [birth year]  Office Visit Note: Visit Date: 09/24/2023 PCP: Pete Brand, DO Referred by: Pete Brand, DO  Subjective: Chief Complaint  Patient presents with   Left Leg - Pain   HPI: Donna [name] is a pleasant 73 y.o. female who presents today for reevaluation of lateral hip/IT band syndrome.  Back in December/January we did treat her IT band syndrome with 2 treatments of extracorporeal shockwave therapy which resolved her pain.  She has continued being very active with her hip and leg exercises, walking between 8-10,000 steps daily and performing yoga.  Over the last few weeks she began having pain over the lateral leg and IT band again.  This is rating to the lateral aspect of the knee.  She does have a trip upcoming in [place]  doing hiking and would like to be feeling well for this.  Pertinent ROS were reviewed with the patient and found to be negative unless otherwise specified above in HPI.   Assessment & Plan: Visit Diagnoses:  1. It band syndrome, left   2. Pain of left hip    Plan: Impression is reexacerbation of underlying IT band syndrome with left hip/leg pain.  She has excellent hip abduction strength, as she has been graduated from PT and continuing her home exercises.  She does have underlying muscle hypertonicity deep to the IT band as well.  We did treat this area with extracorporeal shockwave therapy.  I would like to see her back for at least 2 additional treatments and see what sort of cumulative benefit she has going forward.  She will rest the days of shockwave, but then may return to activity as her pain allows.  Okay for Tylenol  only as needed.  Follow-up: Return for make 2 appts over next week (Tues and Friday) for Left ITB syndrome.   Meds & Orders: No orders of the defined types were placed in this encounter.  No orders of the defined types were placed in this encounter.     Procedures: Procedure: ECSWT Indications:  ITB syndrome   Procedure Details Consent: Risks of procedure as well as the alternatives and risks of each were explained to the patient.  Verbal consent for procedure obtained. Time Out: Verified patient identification, verified procedure, site was marked, verified correct patient position. The area was cleaned with alcohol swab.     The left IT-Band was targeted for Extracorporeal shockwave therapy.    Preset: Muscular injury Power Level: 90 mJ  Frequency: 10 Hz Impulse/cycles: 3000 Head size: Regular   Patient tolerated procedure well without immediate complications.       Clinical History: No specialty comments available.  She reports that she quit smoking about 26 years ago. Her smoking use included cigarettes. She started smoking about 56 years ago. She has never used smokeless tobacco. No results for input(s): "HGBA1C", "LABURIC" in the last 8760 hours.  Objective:    Physical Exam  Gen: Well-appearing, in no acute distress; non-toxic CV: Well-perfused. Warm.  Resp: Breathing unlabored on room air; no wheezing. Psych: Fluid speech in conversation; appropriate affect; normal thought process  Ortho Exam - Left hip/ITB: There is no greater trochanteric TTP or swelling.  Near the mid to distal aspect of the IT band there is reciprocal muscle hypertonicity and tenderness along the IT band.  Positive Ober's test, negative Noble's compression test.  There is 5/5 strength with hip abduction testing.  Imaging: No results  found.  Past Medical/Family/Surgical/Social History: Medications & Allergies reviewed per EMR, new medications updated. Patient Active Problem List   Diagnosis Date Noted   Exposure to hepatitis B 07/01/2023   Need for immunization against viral hepatitis 07/01/2023   Screening for STDs (sexually transmitted diseases) 07/01/2023   History of substance use 07/01/2023   S/P appendectomy 05/11/2023   S/P  laparoscopic appendectomy 05/11/2023   Gasping for breath 08/19/2021   PTSD (post-traumatic stress disorder) 08/19/2021   Parasomnia overlap disorder 08/19/2021   Snoring 08/19/2021   Excessive daytime sleepiness 08/19/2021   Sleep paralysis, recurrent isolated 08/19/2021   Asthma 02/19/2021   Multiple drug allergies 02/19/2021   Gluten intolerance 02/19/2021   Chronic rhinitis 02/19/2021   Metatarsalgia of left foot 11/14/2020   Achilles tendinosis of right lower extremity 11/14/2020   Essential tremor 07/04/2019   DOE (dyspnea on exertion) 01/20/2019   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 06/13/2018   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   Superficial basal cell carcinoma 06/13/2018   Upper airway cough syndrome 06/13/2018   Atrophic vaginitis 02/17/2018   Bilateral leg cramps 02/17/2018   Fatty liver, alcoholic 04/22/2016   Depression 04/19/2016   Withdrawal complaint 04/19/2016   Hx of adenomatous colonic polyps 08/03/2015   Herpes simplex 06/15/2014   GERD (gastroesophageal reflux disease) 10/14/2013   Anxiety state 10/14/2013   Recovering alcoholic in remission (HCC) 10/14/2013   Allergic rhinitis 10/14/2013   Adjustment disorder with anxiety 10/14/2013   Elevated low-density lipoprotein level 10/14/2013   Essential hypertension 10/15/2012   Heart murmur 05/25/2012   Past Medical History:  Diagnosis Date   Anxiety    Asthma    Basal cell carcinoma 06/29/2017   right lip - CX3 + 5FU   BCC (basal cell carcinoma of skin) 10/29/2022   left lower leg, posterior - Mohs 01/27/2023   Cataract    bilateral lens implants   Cough variant asthma    Depression    GERD (gastroesophageal reflux disease)    Heart murmur    Hiatal hernia    Hyperlipidemia    IBS (irritable bowel syndrome)    SCC (squamous cell carcinoma) 10/29/2022   right lower leg, anterior - Mohs 12/28/2022 Dr Gaylyn Keas   Squamous cell carcinoma in situ (SCCIS)  of conjunctiva 10/23/2008   chest - CX3 + 5FU   Squamous cell carcinoma of skin 09/27/2014   left thigh - tx p bx   Substance abuse (HCC)    Family History  Problem Relation Age of Onset   Dementia Mother    Diabetes Mother    Melanoma Father    Colon cancer Father 88       Survived   Heart disease Father    Heart attack Father    Rheum arthritis Father    Dementia Father    Healthy Sister    Healthy Brother    Dementia Maternal Aunt    Dementia Maternal Uncle    Colon polyps Paternal Aunt    Pancreatic cancer Paternal Aunt    Rectal cancer Paternal Aunt    Colon cancer Paternal Aunt    Colon cancer Paternal Grandmother    Breast cancer Maternal Grandmother    Stomach cancer Neg Hx    Esophageal cancer Neg Hx    Liver cancer Neg Hx    Past Surgical History:  Procedure Laterality Date   ANKLE SURGERY     Bilateral    CATARACT EXTRACTION Bilateral  CERVICAL SPINE SURGERY  07/21/2021   LAPAROSCOPIC APPENDECTOMY N/A 05/11/2023   Procedure: APPENDECTOMY LAPAROSCOPIC;  Surgeon: Dorena Gander, MD;  Location: [facility];  Service: General;  Laterality: N/A;   TUBAL LIGATION     Social History   Occupational History   Occupation: [employer]  Tobacco Use   Smoking status: Former    Current packs/day: 0.00    Types: Cigarettes    Start date: 10/16/1966    Quit date: 10/15/1996    Years since quitting: 26.9   Smokeless tobacco: Never  Vaping Use   Vaping status: Never Used  Substance and Sexual Activity   Alcohol use: No   Drug use: No   Sexual activity: Yes

## 2023-09-27 NOTE — Telephone Encounter (Signed)
-----   Message from [facility] sent at 09/24/2023  9:55 AM EDT ----- Right lower leg-SCC- Mohs

## 2023-09-27 NOTE — Telephone Encounter (Signed)
 Spoke with pt gave her bx results and treatment recommendations

## 2023-09-27 NOTE — Telephone Encounter (Signed)
 Called and lvm for patient to call back and go over results

## 2023-09-28 ENCOUNTER — Ambulatory Visit: Admitting: Sports Medicine

## 2023-09-28 ENCOUNTER — Encounter: Payer: Self-pay | Admitting: Sports Medicine

## 2023-09-28 DIAGNOSIS — M76899 Other specified enthesopathies of unspecified lower limb, excluding foot: Secondary | ICD-10-CM

## 2023-09-28 DIAGNOSIS — M25552 Pain in left hip: Secondary | ICD-10-CM

## 2023-09-28 DIAGNOSIS — M7632 Iliotibial band syndrome, left leg: Secondary | ICD-10-CM | POA: Diagnosis not present

## 2023-09-28 MED ORDER — MELOXICAM 15 MG PO TABS: 15.0000 mg | ORAL_TABLET | Freq: Every day | ORAL | 0 refills | Status: DC

## 2023-09-28 NOTE — Progress Notes (Signed)
 Patient says that the tenderness over the IT band has improved since the shockwave therapy, although she does still notice it more closer to the knee. She is having soreness through the front of the quad that she noticed yesterday. She does not remember anything different prior to yesterday that may have contributed to this soreness. She has two more appointments already scheduled after today, each one week apart.

## 2023-09-28 NOTE — Progress Notes (Signed)
 Donna [name] - 73 y.o. female MRN [id number]  Date of birth: 1950/06/28  Office Visit Note: Visit Date: 09/28/2023 PCP: Pete Brand, DO Referred by: Pete Brand, DO  Subjective: Chief Complaint  Patient presents with   Left Leg - Follow-up   HPI: Donna [name] is a pleasant 73 y.o. female who presents today for follow-up of left lateral hip/leg pain with concomitant IT band syndrome.  Also having more tenderness and pain in the quadricep musculature.  We did perform our first shockwave treatment for the lateral hip and IT band.  She did notice some improvements with this more proximally and in the mid aspect of the IT band.  More of her pain is emanating more so now on the distal IT band about the lateral knee.  She also noticed some soreness and pain in the quadricep muscle which she did not have before.  She has tried stretching but this almost makes things worse.  Pertinent ROS were reviewed with the patient and found to be negative unless otherwise specified above in HPI.   Assessment & Plan: Visit Diagnoses:  1. It band syndrome, left   2. Quadriceps tendinitis   3. Pain of left hip    Plan: Impression is left lateral hip/leg pain with underlying IT band syndrome.  She did notice some improvements with her first shockwave treatment, we did repeat this today focusing more so on the distal IT band which is most symptomatic for her.  She also is having some reciprocal quadricep tendinitis, we did treat this area with extracorporeal shockwave therapy as well.  I would like her to stop stretching the area for the next 2 days to allow this to settle down before then she returns to dynamic warm up, yoga and other physical activity. To settle down her inflammatory component, I would like her to begin Meloxicam  15mg  daily for the next week and then consider taper off at our next visit in 1-week in which we will reassess both conditions as above. She is agreeable to this  plan.  Follow-up: Return in about 1 week (around 10/05/2023).   Meds & Orders:  Meds ordered this encounter  Medications   meloxicam  (MOBIC ) 15 MG tablet    Sig: Take 1 tablet (15 mg total) by mouth daily.    Dispense:  21 tablet    Refill:  0   No orders of the defined types were placed in this encounter.    Procedures: Procedure: ECSWT Indications:  ITB syndrome   Procedure Details Consent: Risks of procedure as well as the alternatives and risks of each were explained to the patient.  Verbal consent for procedure obtained. Time Out: Verified patient identification, verified procedure, site was marked, verified correct patient position. The area was cleaned with alcohol swab.     The left IT-Band was targeted for Extracorporeal shockwave therapy.    Preset: Muscular injury Power Level: 100 mJ   Frequency: 10 Hz Impulse/cycles: 2500 Head size: Regular  The left quadriceps muscle and distal RF-tendon was targeted for Extracorporeal shockwave therapy.    Preset: Tendinitis Power Level: 90-100 mJ   Frequency: 10 Hz Impulse/cycles: 1750 Head size: Regular   Patient tolerated procedure well without immediate complications.       Clinical History: No specialty comments available.  She reports that she quit smoking about 26 years ago. Her smoking use included cigarettes. She started smoking about 56 years ago. She has never used smokeless tobacco. No results for input(s): "  HGBA1C", "LABURIC" in the last 8760 hours.  Objective:    Physical Exam  Gen: Well-appearing, in no acute distress; non-toxic CV: Well-perfused. Warm.  Resp: Breathing unlabored on room air; no wheezing. Psych: Fluid speech in conversation; appropriate affect; normal thought process  Ortho Exam - Left hip/IT band: There is no greater trochanteric TTP or swelling.  There is hypertonicity and associated tenderness/trigger point in the mid and more specifically the distal IT band today.  Positive Ober's  test, negative Noble's compression test.  There is some reciprocal mid to distal quadricep muscle fascial restriction and tenderness to palpation.  There is good range of motion about the knee with flexion and extension.  Imaging:  Narrative & Impression  CLINICAL DATA:  Chronic left hip pain   EXAM: MR OF THE LEFT HIP WITHOUT CONTRAST   TECHNIQUE: Multiplanar, multisequence MR imaging was performed. No intravenous contrast was administered.   COMPARISON:  Radiographs 09/30/2022   FINDINGS: Bones: Mild degenerative spurring of both femoral heads and acetabula. No regional marrow edema or acute bony findings.   Articular cartilage and labrum   Articular cartilage: Mild to moderate craniocaudad and axial loss of articular cartilage thickness. No focal defect identified.   Labrum: Suspected degeneration of the anterior superior labrum although a well-defined tear is not observed.   Joint or bursal effusion   Joint effusion:  Absent   Bursae: No regional bursitis.   Muscles and tendons   Muscles and tendons:  Symmetric proximal hamstring tendinopathy.   Other findings   Miscellaneous:   No supplemental non-categorized findings.   IMPRESSION: 1. Mild to moderate degenerative hip arthropathy bilaterally. 2. Suspected degeneration of the anterior superior labrum although a well-defined tear is not observed. 3. Symmetric proximal hamstring tendinopathy.     Electronically Signed   By: Freida Jes M.D.   On: 02/16/2023 10:45    Past Medical/Family/Surgical/Social History: Medications & Allergies reviewed per EMR, new medications updated. Patient Active Problem List   Diagnosis Date Noted   Exposure to hepatitis B 07/01/2023   Need for immunization against viral hepatitis 07/01/2023   Screening for STDs (sexually transmitted diseases) 07/01/2023   History of substance use 07/01/2023   S/P appendectomy 05/11/2023   S/P laparoscopic appendectomy 05/11/2023    Gasping for breath 08/19/2021   PTSD (post-traumatic stress disorder) 08/19/2021   Parasomnia overlap disorder 08/19/2021   Snoring 08/19/2021   Excessive daytime sleepiness 08/19/2021   Sleep paralysis, recurrent isolated 08/19/2021   Asthma 02/19/2021   Multiple drug allergies 02/19/2021   Gluten intolerance 02/19/2021   Chronic rhinitis 02/19/2021   Metatarsalgia of left foot 11/14/2020   Achilles tendinosis of right lower extremity 11/14/2020   Essential tremor 07/04/2019   DOE (dyspnea on exertion) 01/20/2019   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 06/13/2018   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   Superficial basal cell carcinoma 06/13/2018   Upper airway cough syndrome 06/13/2018   Atrophic vaginitis 02/17/2018   Bilateral leg cramps 02/17/2018   Fatty liver, alcoholic 04/22/2016   Depression 04/19/2016   Withdrawal complaint 04/19/2016   Hx of adenomatous colonic polyps 08/03/2015   Herpes simplex 06/15/2014   GERD (gastroesophageal reflux disease) 10/14/2013   Anxiety state 10/14/2013   Recovering alcoholic in remission (HCC) 10/14/2013   Allergic rhinitis 10/14/2013   Adjustment disorder with anxiety 10/14/2013   Elevated low-density lipoprotein level 10/14/2013   Essential hypertension 10/15/2012   Heart murmur 05/25/2012   Past Medical  History:  Diagnosis Date   Anxiety    Asthma    Basal cell carcinoma 06/29/2017   right lip - CX3 + 5FU   BCC (basal cell carcinoma of skin) 10/29/2022   left lower leg, posterior - Mohs 01/27/2023   Cataract    bilateral lens implants   Cough variant asthma    Depression    GERD (gastroesophageal reflux disease)    Heart murmur    Hiatal hernia    Hyperlipidemia    IBS (irritable bowel syndrome)    SCC (squamous cell carcinoma) 10/29/2022   right lower leg, anterior - Mohs 12/28/2022 Dr Gaylyn Keas   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   chest - CX3  + 5FU   Squamous cell carcinoma of skin 09/27/2014   left thigh - tx p bx   Substance abuse (HCC)    Family History  Problem Relation Age of Onset   Dementia Mother    Diabetes Mother    Melanoma Father    Colon cancer Father 65       Survived   Heart disease Father    Heart attack Father    Rheum arthritis Father    Dementia Father    Healthy Sister    Healthy Brother    Dementia Maternal Aunt    Dementia Maternal Uncle    Colon polyps Paternal Aunt    Pancreatic cancer Paternal Aunt    Rectal cancer Paternal Aunt    Colon cancer Paternal Aunt    Colon cancer Paternal Grandmother    Breast cancer Maternal Grandmother    Stomach cancer Neg Hx    Esophageal cancer Neg Hx    Liver cancer Neg Hx    Past Surgical History:  Procedure Laterality Date   ANKLE SURGERY     Bilateral    CATARACT EXTRACTION Bilateral    CERVICAL SPINE SURGERY  07/21/2021   LAPAROSCOPIC APPENDECTOMY N/A 05/11/2023   Procedure: APPENDECTOMY LAPAROSCOPIC;  Surgeon: Dorena Gander, MD;  Location: [facility] OR;  Service: General;  Laterality: N/A;   TUBAL LIGATION     Social History   Occupational History   Occupation: [employer]  Tobacco Use   Smoking status: Former    Current packs/day: 0.00    Types: Cigarettes    Start date: 10/16/1966    Quit date: 10/15/1996    Years since quitting: 26.9   Smokeless tobacco: Never  Vaping Use   Vaping status: Never Used  Substance and Sexual Activity   Alcohol use: No   Drug use: No   Sexual activity: Yes

## 2023-09-30 ENCOUNTER — Ambulatory Visit: Admitting: Sports Medicine

## 2023-09-30 DIAGNOSIS — F419 Anxiety disorder, unspecified: Secondary | ICD-10-CM | POA: Diagnosis not present

## 2023-09-30 DIAGNOSIS — G4700 Insomnia, unspecified: Secondary | ICD-10-CM | POA: Diagnosis not present

## 2023-09-30 DIAGNOSIS — F329 Major depressive disorder, single episode, unspecified: Secondary | ICD-10-CM | POA: Diagnosis not present

## 2023-09-30 DIAGNOSIS — F4310 Post-traumatic stress disorder, unspecified: Secondary | ICD-10-CM | POA: Diagnosis not present

## 2023-10-05 ENCOUNTER — Ambulatory Visit: Admitting: Sports Medicine

## 2023-10-05 ENCOUNTER — Encounter: Payer: Self-pay | Admitting: Sports Medicine

## 2023-10-05 DIAGNOSIS — M7632 Iliotibial band syndrome, left leg: Secondary | ICD-10-CM | POA: Diagnosis not present

## 2023-10-05 DIAGNOSIS — M76899 Other specified enthesopathies of unspecified lower limb, excluding foot: Secondary | ICD-10-CM | POA: Diagnosis not present

## 2023-10-05 DIAGNOSIS — M6281 Muscle weakness (generalized): Secondary | ICD-10-CM | POA: Diagnosis not present

## 2023-10-05 NOTE — Progress Notes (Signed)
 Patient says that overall she is feeling a bit better. 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 She does say that some of her soreness seems to be shifting into the quad.   Patient was instructed in 10 minutes of therapeutic exercises for left leg to improve strength, ROM and function according to my instructions and plan of care by a Certified Athletic Trainer during the office visit. A customized handout was provided and demonstration of proper technique shown and discussed. Patient did perform exercises and demonstrate understanding through teachback.  All questions discussed and answered.

## 2023-10-05 NOTE — Progress Notes (Signed)
 Donna [name] - 73 y.o. female MRN [id number]  Date of birth: 09/28/1950  Office Visit Note: Visit Date: 10/05/2023 PCP: Pete Brand, DO Referred by: Pete Brand, DO  Subjective: Chief Complaint  Patient presents with   Left Leg - Follow-up   HPI: Donna [name] is a pleasant 73 y.o. female who presents today for follow-up of left IT-band syndrome and quadriceps pain/fascial restriction.  She is doing much better compared to her previous flareup last year, she has received relief and noticed improvement after our first 2 treatments of extracorporeal shockwave therapy here in May.  She has tolerated meloxicam  15 mg daily.  She is leaving for her [place] on [place] 6/7th.  She was doing some clam exercises for her class and did seem to mildly flare of her pain. Tolerated shockwave for the quadricep but feels more of her pain is here at this point as well. She has not done any PT/rehab for quad.  Pertinent ROS were reviewed with the patient and found to be negative unless otherwise specified above in HPI.   Assessment & Plan: Visit Diagnoses:  1. It band syndrome, left   2. Quadriceps tendinitis   3. Weakness of left quadriceps muscle    Plan: Impression is left lateral hip/leg pain which is twofold in nature.  She does have underlying IT band syndrome with fascial restriction.  She has noticed improvements with extracorporeal shockwave therapy.  She does have some pain localizing to the quadricep tendons more so over the lateral side (VL) in the mid belly with a degree of tendinitis and some functional weakness (no gross weakness).  We did repeat shockwave treatment for the IT band as well as the quadricep musculature today.  On this upcoming Wednesday, I would like her to discontinue her meloxicam  15 mg daily and see her response once stopping this.  I would like to get her into rehab therapy for both the IT band and her quadricep musculature.  2 handouts were printed out  and reviewed in the room with her today by my athletic trainer, Lonzell Robin.  Will begin every other day of IT band stretching as well as strengthening exercises for the quadricep musculature.  She will follow-up next week before she leaves for her trip and we will reassess and consider additional shockwave treatments as indicated.  Follow-up: Return in about 6 days (around 10/11/2023) for LLE.   Meds & Orders: No orders of the defined types were placed in this encounter.  No orders of the defined types were placed in this encounter.    Procedures: Procedure: ECSWT Indications:  ITB syndrome   Procedure Details Consent: Risks of procedure as well as the alternatives and risks of each were explained to the patient.  Verbal consent for procedure obtained. Time Out: Verified patient identification, verified procedure, site was marked, verified correct patient position. The area was cleaned with alcohol swab.     The left IT-Band was targeted for Extracorporeal shockwave therapy.    Preset: Muscular injury Power Level: 100 mJ   Frequency: 10 Hz Impulse/cycles: 2500 Head size: Regular   The left quadriceps muscle and distal RF-tendon was targeted for Extracorporeal shockwave therapy.    Preset: Tendinitis Power Level: 90-100 mJ   Frequency: 10 Hz Impulse/cycles: 1600 Head size: Regular   Patient tolerated procedure well without immediate complications.      Clinical History: No specialty comments available.  She reports that she quit smoking about 26 years ago. Her  smoking use included cigarettes. She started smoking about 57 years ago. She has never used smokeless tobacco. No results for input(s): "HGBA1C", "LABURIC" in the last 8760 hours.  Objective:    Physical Exam  Gen: Well-appearing, in no acute distress; non-toxic CV: Well-perfused. Warm.  Resp: Breathing unlabored on room air; no wheezing. Psych: Fluid speech in conversation; appropriate affect; normal thought  process  Ortho Exam - LLE/Hips: There is no tenderness to palpation over bilateral greater trochanteric regions.  Strength testing was test of the hip abductors which is 5/5 strength in a 3-phase positioning of the gluteus medius/minimus as well as the TFL.  There is tenderness today more so over the mid aspect of the IT band. Negative Ober's test distally today.  Imaging: No results found.  Past Medical/Family/Surgical/Social History: Medications & Allergies reviewed per EMR, new medications updated. Patient Active Problem List   Diagnosis Date Noted   Exposure to hepatitis B 07/01/2023   Need for immunization against viral hepatitis 07/01/2023   Screening for STDs (sexually transmitted diseases) 07/01/2023   History of substance use 07/01/2023   S/P appendectomy 05/11/2023   S/P laparoscopic appendectomy 05/11/2023   Gasping for breath 08/19/2021   PTSD (post-traumatic stress disorder) 08/19/2021   Parasomnia overlap disorder 08/19/2021   Snoring 08/19/2021   Excessive daytime sleepiness 08/19/2021   Sleep paralysis, recurrent isolated 08/19/2021   Asthma 02/19/2021   Multiple drug allergies 02/19/2021   Gluten intolerance 02/19/2021   Chronic rhinitis 02/19/2021   Metatarsalgia of left foot 11/14/2020   Achilles tendinosis of right lower extremity 11/14/2020   Essential tremor 07/04/2019   DOE (dyspnea on exertion) 01/20/2019   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 06/13/2018   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   Superficial basal cell carcinoma 06/13/2018   Upper airway cough syndrome 06/13/2018   Atrophic vaginitis 02/17/2018   Bilateral leg cramps 02/17/2018   Fatty liver, alcoholic 04/22/2016   Depression 04/19/2016   Withdrawal complaint 04/19/2016   Hx of adenomatous colonic polyps 08/03/2015   Herpes simplex 06/15/2014   GERD (gastroesophageal reflux disease) 10/14/2013   Anxiety state 10/14/2013   Recovering  alcoholic in remission (HCC) 10/14/2013   Allergic rhinitis 10/14/2013   Adjustment disorder with anxiety 10/14/2013   Elevated low-density lipoprotein level 10/14/2013   Essential hypertension 10/15/2012   Heart murmur 05/25/2012   Past Medical History:  Diagnosis Date   Anxiety    Asthma    Basal cell carcinoma 06/29/2017   right lip - CX3 + 5FU   BCC (basal cell carcinoma of skin) 10/29/2022   left lower leg, posterior - Mohs 01/27/2023   Cataract    bilateral lens implants   Cough variant asthma    Depression    GERD (gastroesophageal reflux disease)    Heart murmur    Hiatal hernia    Hyperlipidemia    IBS (irritable bowel syndrome)    SCC (squamous cell carcinoma) 10/29/2022   right lower leg, anterior - Mohs 12/28/2022 Dr Gaylyn Keas   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   chest - CX3 + 5FU   Squamous cell carcinoma of skin 09/27/2014   left thigh - tx p bx   Substance abuse (HCC)    Family History  Problem Relation Age of Onset   Dementia Mother    Diabetes Mother    Melanoma Father    Colon cancer Father 34       Survived  Heart disease Father    Heart attack Father    Rheum arthritis Father    Dementia Father    Healthy Sister    Healthy Brother    Dementia Maternal Aunt    Dementia Maternal Uncle    Colon polyps Paternal Aunt    Pancreatic cancer Paternal Aunt    Rectal cancer Paternal Aunt    Colon cancer Paternal Aunt    Colon cancer Paternal Grandmother    Breast cancer Maternal Grandmother    Stomach cancer Neg Hx    Esophageal cancer Neg Hx    Liver cancer Neg Hx    Past Surgical History:  Procedure Laterality Date   ANKLE SURGERY     Bilateral    CATARACT EXTRACTION Bilateral    CERVICAL SPINE SURGERY  07/21/2021   LAPAROSCOPIC APPENDECTOMY N/A 05/11/2023   Procedure: APPENDECTOMY LAPAROSCOPIC;  Surgeon: Dorena Gander, MD;  Location: [facility] OR;  Service: General;  Laterality: N/A;   TUBAL LIGATION     Social  History   Occupational History   Occupation: [employer]  Tobacco Use   Smoking status: Former    Current packs/day: 0.00    Types: Cigarettes    Start date: 10/16/1966    Quit date: 10/15/1996    Years since quitting: 26.9   Smokeless tobacco: Never  Vaping Use   Vaping status: Never Used  Substance and Sexual Activity   Alcohol use: No   Drug use: No   Sexual activity: Yes

## 2023-10-11 ENCOUNTER — Ambulatory Visit: Admitting: Sports Medicine

## 2023-10-11 ENCOUNTER — Encounter: Payer: Self-pay | Admitting: Sports Medicine

## 2023-10-11 DIAGNOSIS — M76899 Other specified enthesopathies of unspecified lower limb, excluding foot: Secondary | ICD-10-CM

## 2023-10-11 DIAGNOSIS — M7632 Iliotibial band syndrome, left leg: Secondary | ICD-10-CM

## 2023-10-11 DIAGNOSIS — M6281 Muscle weakness (generalized): Secondary | ICD-10-CM

## 2023-10-11 NOTE — Progress Notes (Signed)
 Donna [name] - 73 y.o. female MRN [id number]  Date of birth: 1951-05-01  Office Visit Note: Visit Date: 10/11/2023 PCP: Pete Brand, DO Referred by: Pete Brand, DO  Subjective: Chief Complaint  Patient presents with   Left Leg - Follow-up   HPI: Donna [name] is a pleasant 73 y.o. female who presents today for  follow-up of left IT-band syndrome and quadriceps pain/fascial restriction.   Donna [name] is doing better since last visit.  She states the quadricep pain is actually significantly improved.  Her IT band is definitely improved from prior to her treatments but she still feels this and some restriction.  She did incorporate her home exercises and did find these very beneficial.  She has some pain with actually the contralateral leg in an externally rotated position with SLR.  She is leaving for her hiking trip next Monday.  She did notice some improvement with meloxicam  but did not get rid of her pain completely.  Pertinent ROS were reviewed with the patient and found to be negative unless otherwise specified above in HPI.   Assessment & Plan: Visit Diagnoses:  1. It band syndrome, left   2. Quadriceps tendinitis   3. Weakness of left quadriceps muscle    Plan: Donna [name] has been making good improvement both in her bilateral quadricep tendinitis and left IT band syndrome.  She is not really much of any pain in the left quadricep today as she has responded well to extracorporeal shockwave therapy.  We did repeat shockwave therapy both for the left quadricep as well as the left IT band.  She will continue her stretching and strengthening exercises for both the IT band as well as her quadricep musculature bilaterally.  We did perform her last treatment of shockwave therapy prior to her hiking trip.  I would like her to bring her meloxicam  15 mg on her trip and take this daily as needed but she no longer needs to take this consistently.  She will notify me how she is doing a few weeks  after she returns from her trip.  Follow-up as needed.  Follow-up: Return if symptoms worsen or fail to improve.   Meds & Orders: No orders of the defined types were placed in this encounter.  No orders of the defined types were placed in this encounter.    Procedures: Procedure: ECSWT Indications:  ITB syndrome   Procedure Details Consent: Risks of procedure as well as the alternatives and risks of each were explained to the patient.  Verbal consent for procedure obtained. Time Out: Verified patient identification, verified procedure, site was marked, verified correct patient position. The area was cleaned with alcohol swab.     The left IT-Band was targeted for Extracorporeal shockwave therapy.    Preset: Muscular injury Power Level: 100-110 mJ   Frequency: 11 Hz Impulse/cycles: 2800 Head size: Regular   The left quadriceps muscle and distal RF-tendon was targeted for Extracorporeal shockwave therapy.    Preset: Tendinitis Power Level: 100 mJ   Frequency: 10 Hz Impulse/cycles: 1800 Head size: Regular        Clinical History: No specialty comments available.  She reports that she quit smoking about 27 years ago. Her smoking use included cigarettes. She started smoking about 57 years ago. She has never used smokeless tobacco. No results for input(s): "HGBA1C", "LABURIC" in the last 8760 hours.  Objective:    Physical Exam  Gen: Well-appearing, in no acute distress; non-toxic CV: Well-perfused. Warm.  Resp: Breathing  unlabored on room air; no wheezing. Psych: Fluid speech in conversation; appropriate affect; normal thought process  Ortho Exam - Left hip/leg: No greater trochanter TTP.  There is mild tenderness in the midportion of the IT band.  There is improvement in fascial restriction over the quadricep tendon.  No redness swelling or effusion about the hip or knee.  Imaging: No results found.  Past Medical/Family/Surgical/Social History: Medications & Allergies  reviewed per EMR, new medications updated. Patient Active Problem List   Diagnosis Date Noted   Exposure to hepatitis B 07/01/2023   Need for immunization against viral hepatitis 07/01/2023   Screening for STDs (sexually transmitted diseases) 07/01/2023   History of substance use 07/01/2023   S/P appendectomy 05/11/2023   S/P laparoscopic appendectomy 05/11/2023   Gasping for breath 08/19/2021   PTSD (post-traumatic stress disorder) 08/19/2021   Parasomnia overlap disorder 08/19/2021   Snoring 08/19/2021   Excessive daytime sleepiness 08/19/2021   Sleep paralysis, recurrent isolated 08/19/2021   Asthma 02/19/2021   Multiple drug allergies 02/19/2021   Gluten intolerance 02/19/2021   Chronic rhinitis 02/19/2021   Metatarsalgia of left foot 11/14/2020   Achilles tendinosis of right lower extremity 11/14/2020   Essential tremor 07/04/2019   DOE (dyspnea on exertion) 01/20/2019   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 06/13/2018   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   Superficial basal cell carcinoma 06/13/2018   Upper airway cough syndrome 06/13/2018   Atrophic vaginitis 02/17/2018   Bilateral leg cramps 02/17/2018   Fatty liver, alcoholic 04/22/2016   Depression 04/19/2016   Withdrawal complaint 04/19/2016   Hx of adenomatous colonic polyps 08/03/2015   Herpes simplex 06/15/2014   GERD (gastroesophageal reflux disease) 10/14/2013   Anxiety state 10/14/2013   Recovering alcoholic in remission (HCC) 10/14/2013   Allergic rhinitis 10/14/2013   Adjustment disorder with anxiety 10/14/2013   Elevated low-density lipoprotein level 10/14/2013   Essential hypertension 10/15/2012   Heart murmur 05/25/2012   Past Medical History:  Diagnosis Date   Anxiety    Asthma    Basal cell carcinoma 06/29/2017   right lip - CX3 + 5FU   BCC (basal cell carcinoma of skin) 10/29/2022   left lower leg, posterior - Mohs 01/27/2023   Cataract     bilateral lens implants   Cough variant asthma    Depression    GERD (gastroesophageal reflux disease)    Heart murmur    Hiatal hernia    Hyperlipidemia    IBS (irritable bowel syndrome)    SCC (squamous cell carcinoma) 10/29/2022   right lower leg, anterior - Mohs 12/28/2022 Dr Gaylyn Keas   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   chest - CX3 + 5FU   Squamous cell carcinoma of skin 09/27/2014   left thigh - tx p bx   Substance abuse (HCC)    Family History  Problem Relation Age of Onset   Dementia Mother    Diabetes Mother    Melanoma Father    Colon cancer Father 3       Survived   Heart disease Father    Heart attack Father    Rheum arthritis Father    Dementia Father    Healthy Sister    Healthy Brother    Dementia Maternal Aunt    Dementia Maternal Uncle    Colon polyps Paternal Aunt    Pancreatic cancer Paternal Aunt    Rectal cancer Paternal Aunt  Colon cancer Paternal Aunt    Colon cancer Paternal Grandmother    Breast cancer Maternal Grandmother    Stomach cancer Neg Hx    Esophageal cancer Neg Hx    Liver cancer Neg Hx    Past Surgical History:  Procedure Laterality Date   ANKLE SURGERY     Bilateral    CATARACT EXTRACTION Bilateral    CERVICAL SPINE SURGERY  07/21/2021   LAPAROSCOPIC APPENDECTOMY N/A 05/11/2023   Procedure: APPENDECTOMY LAPAROSCOPIC;  Surgeon: Dorena Gander, MD;  Location: [facility] OR;  Service: General;  Laterality: N/A;   TUBAL LIGATION     Social History   Occupational History   Occupation: [employer]  Tobacco Use   Smoking status: Former    Current packs/day: 0.00    Types: Cigarettes    Start date: 10/16/1966    Quit date: 10/15/1996    Years since quitting: 27.0   Smokeless tobacco: Never  Vaping Use   Vaping status: Never Used  Substance and Sexual Activity   Alcohol use: No   Drug use: No   Sexual activity: Yes

## 2023-10-11 NOTE — Progress Notes (Signed)
 Patient says that she is doing well. She has been incorporating her new exercises in the last week, and says they feel good. Her straight leg raise with external rotation is very challenging for both legs. She leaves for her trip a week from today.

## 2023-10-14 ENCOUNTER — Encounter: Payer: Self-pay | Admitting: Allergy & Immunology

## 2023-10-19 ENCOUNTER — Ambulatory Visit: Payer: Medicare HMO | Admitting: Dermatology

## 2023-10-19 NOTE — Telephone Encounter (Signed)
 Lvmtcb to sch f/u

## 2023-10-28 ENCOUNTER — Encounter: Payer: Self-pay | Admitting: Dermatology

## 2023-10-28 ENCOUNTER — Ambulatory Visit: Admitting: Dermatology

## 2023-10-28 ENCOUNTER — Ambulatory Visit: Admitting: Allergy & Immunology

## 2023-10-28 DIAGNOSIS — C4492 Squamous cell carcinoma of skin, unspecified: Secondary | ICD-10-CM

## 2023-10-28 DIAGNOSIS — L579 Skin changes due to chronic exposure to nonionizing radiation, unspecified: Secondary | ICD-10-CM | POA: Diagnosis not present

## 2023-10-28 DIAGNOSIS — C44722 Squamous cell carcinoma of skin of right lower limb, including hip: Secondary | ICD-10-CM | POA: Diagnosis not present

## 2023-10-28 DIAGNOSIS — L814 Other melanin hyperpigmentation: Secondary | ICD-10-CM | POA: Diagnosis not present

## 2023-10-28 MED ORDER — MUPIROCIN 2 % EX OINT: 1.0000 | TOPICAL_OINTMENT | Freq: Two times a day (BID) | CUTANEOUS | 5 refills | Status: DC

## 2023-10-28 MED ORDER — OXYCODONE HCL 5 MG PO TABS: 5.0000 mg | ORAL_TABLET | Freq: Four times a day (QID) | ORAL | 0 refills | Status: DC | PRN

## 2023-10-28 NOTE — Patient Instructions (Signed)

## 2023-10-28 NOTE — Progress Notes (Signed)
 Follow-Up Visit   Subjective  Donna [name] is a 73 y.o. female who presents for the following: Mohs of of a Well Differentiated Squamous Cell Carcinoma on the right lower leg-posterior, biopsied by Dr. Fain Home.  The following portions of the chart were reviewed this encounter and updated as appropriate: medications, allergies, medical history  Review of Systems:  No other skin or systemic complaints except as noted in HPI or Assessment and Plan.  Objective  Well appearing patient in no apparent distress; mood and affect are within normal limits.  A focused examination was performed of the following areas: Right lower leg-posterior Relevant physical exam findings are noted in the Assessment and Plan.   Right Lower Leg - Posterior Healing biopsy site   Assessment & Plan   SQUAMOUS CELL CARCINOMA OF SKIN Right Lower Leg - Posterior Mohs surgery  Consent obtained: written  Anticoagulation: Is the patient taking prescription anticoagulant and/or aspirin prescribed/recommended by a physician? No   Was the anticoagulation regimen changed prior to Mohs? No    Anesthesia: Anesthesia method: local infiltration Local anesthetic: lidocaine  1% WITH epi  Procedure Details: Timeout: pre-procedure verification complete Procedure Prep: patient was prepped and draped in usual sterile fashion Prep type: chlorhexidine Biopsy accession number: [id number] Biopsy lab: [facility] Frozen section biopsy performed: No   Specimen debulked: No   Pre-Op diagnosis: squamous cell carcinoma SCC subtype: well differentiated MohsAIQ Surgical site (if tumor spans multiple areas, please select predominant area): lower limb (including hip) Surgery side: right Surgical site (from skin exam): Right Lower Leg - Posterior Pre-operative length (cm): 0.7 Pre-operative width (cm): 0.5 Indications for Mohs surgery: anatomic location where tissue conservation is critical Previously treated? No    Micrographic  Surgery Details: Post-operative length (cm): 1.1 Post-operative width (cm): 0.8 Number of Mohs stages: 1 Cumulative additional sections past 5 per stage: 0 Post surgery depth of defect: subcutaneous fat Is this a complex case (associate members only): No    Stage 1    Tumor features identified on Mohs section: no tumor identified    Depth of tumor invasion after stage: subcutaneous fat    Perineural invasion: no perineural invasion  Patient tolerance of procedure: tolerated well, no immediate complications  Reconstruction: Was the defect reconstructed?: No    Opioids: Did the patient receive a prescription for opioid/narcotic related to Mohs surgery? Yes   Indications for opioid/narcotics: patient required additional pain relief despite trial of non-opioid analgesia  Antibiotics: Does patient meet AHA guidelines for endocarditis?: No   Does patient meet AHA guidelines for orthopedic prophylaxis?: No   Were antibiotics given on the day of surgery? Yes   When were antibiotics given? post-operative Did surgery breach mucosa, expose cartilage/bone, involve an area of lymphedema/inflamed/infected tissue? No   Indication for post-operative antibiotics: anatomic location Related Medications oxyCODONE  (OXY IR/ROXICODONE ) 5 MG immediate release tablet Take 1 tablet (5 mg total) by mouth every 6 (six) hours as needed for up to 8 doses.   Return in about 4 weeks (around 11/25/2023) for mohs follow up.  Maurine Sovereign, RN, am acting as scribe for Deneise Finlay, MD .   10/28/2023  HISTORY OF PRESENT ILLNESS  Donna [name] is seen in consultation at the request of Dr. Fain Home for biopsy-proven Well Differentiated Squamous Cell Carcinoma of the right lower leg. They note that the area has been present for about 2 months increasing in size with time.  There is no history of previous treatment.  Reports no other  new or changing lesions and has no other complaints today.  Medications and  allergies: see patient chart.  Review of systems: Reviewed 8 systems and notable for the above skin cancer.  All other systems reviewed are unremarkable/negative, unless noted in the HPI. Past medical history, surgical history, family history, social history were also reviewed and are noted in the chart/questionnaire.    PHYSICAL EXAMINATION  General: Well-appearing, in no acute distress, alert and oriented x 4. Vitals reviewed in chart (if available).   Skin: Exam reveals a 0.7 x 0.5 cm erythematous papule and biopsy scar on the right lower leg. There are rhytids, telangiectasias, and lentigines, consistent with photodamage.   Biopsy report(s) reviewed, confirming the diagnosis.   ASSESSMENT  1) Well Differentiated Squamous Cell Carcinoma  of the right lower leg 2) photodamage 3) solar lentigines   PLAN   1. Due to location, size, histology, or recurrence and the likelihood of subclinical extension as well as the need to conserve normal surrounding tissue, the patient was deemed acceptable for Mohs micrographic surgery (MMS).  The nature and purpose of the procedure, associated benefits and risks including recurrence and scarring, possible complications such as pain, infection, and bleeding, and alternative methods of treatment if appropriate were discussed with the patient during consent. The lesion location was verified by the patient, by reviewing previous notes, pathology reports, and by photographs as well as angulation measurements if available.  Informed consent was reviewed and signed by the patient, and timeout was performed at 9:00 AM. See op note below.  2. For the photodamage and solar lentigines, sun protection discussed/information given on OTC sunscreens, and we recommend continued regular follow-up with primary dermatologist every 6 months or sooner for any growing, bleeding, or changing lesions. 3. Prognosis and future surveillance discussed. 4. Letter with treatment outcome  sent to referring provider. 5. Pain acetaminophen /ibuprofen/oxycodone  5 mg   MOHS MICROGRAPHIC SURGERY AND RECONSTRUCTION  Initial size:   0.7 x 0.5 cm Surgical defect/wound size: 1.1 x 0.8 cm Anesthesia:    0.33% lidocaine  with 1:200,000 epinephrine  EBL:    <5 mL Complications:  None Repair type:   Secondary Intention   Stages: 1  STAGE I: Anesthesia achieved with 0.5% lidocaine  with 1:200,000 epinephrine . ChloraPrep applied. 1 section(s) excised using Mohs technique (this includes total peripheral and deep tissue margin excision and evaluation with frozen sections, excised and interpreted by the same physician). The tumor was first debulked and then excised with an approx. 2mm margin.  Hemostasis was achieved with electrocautery as needed.  The specimen was then oriented, subdivided/relaxed, inked, and processed using Mohs technique.    Frozen section analysis revealed a clear deep and peripheral margin.  Reconstruction  Patient was notified of results and repair options were discussed, including second intention healing. After reviewing the advantages and disadvantages of each, we agreed on second intention healing as appropriate.   The surgical site was then lightly scrubbed with sterile, saline-soaked gauze.  The area was bandaged using Vaseline ointment, non-adherent gauze, gauze pads, and tape to provide an adequate pressure dressing.   The patient tolerated the procedure well, was given detailed written and verbal wound care instructions, and was discharged in good condition.  The patient will follow-up in 4 weeks and as scheduled with primary dermatologist.  Documentation: I have reviewed the above documentation for accuracy and completeness, and I agree with the above.  Deneise Finlay, MD

## 2023-11-02 ENCOUNTER — Encounter: Payer: Self-pay | Admitting: Allergy & Immunology

## 2023-11-02 ENCOUNTER — Ambulatory Visit: Admitting: Allergy & Immunology

## 2023-11-02 DIAGNOSIS — J302 Other seasonal allergic rhinitis: Secondary | ICD-10-CM

## 2023-11-02 DIAGNOSIS — J3089 Other allergic rhinitis: Secondary | ICD-10-CM | POA: Diagnosis not present

## 2023-11-02 DIAGNOSIS — J4530 Mild persistent asthma, uncomplicated: Secondary | ICD-10-CM

## 2023-11-02 DIAGNOSIS — K9041 Non-celiac gluten sensitivity: Secondary | ICD-10-CM

## 2023-11-02 NOTE — Progress Notes (Signed)
 FOLLOW UP  Date of Service/Encounter:  11/02/23   Assessment:   Mild persistent asthma, uncomplicated   Non-allergic rhinitis - planning for intradermal testing at the next visit   Gluten intolerance   Essential tremors - still controlled despite stopping propanolol  Plan/Recommendations:   Asthma:  - Lung testing looked pretty good.  - Daily controller medication(s): NOTHING - Prior to physical activity: albuterol 2 puffs 10-15 minutes before physical activity. - Rescue medications: albuterol 4 puffs every 4-6 hours as needed - Changes during respiratory infections or worsening symptoms: Add on Symbicort  to 2 puffs twice daily for TWO WEEKS. - Asthma control goals:  * Full participation in all desired activities (may need albuterol before activity) * Albuterol use two time or less a week on average (not counting use with activity) * Cough interfering with sleep two time or less a month * Oral steroids no more than once a year * No hospitalizations  Chronic Rhinitis: - Testing today showed: trees, indoor molds, outdoor molds, and cat - Copy of test results provided.  - Avoidance measures provided. - Stop taking: Claritin - Start taking: Zyrtec (cetirizine) 10mg  tablet once daily - You can use an extra dose of the antihistamine, if needed, for breakthrough symptoms.  - Consider nasal saline rinses 1-2 times daily to remove allergens from the nasal cavities as well as help with mucous clearance (this is especially helpful to do before the nasal sprays are given) - Consider allergy  shots as a means of long-term control. - Allergy  shots re-train and reset the immune system to ignore environmental allergens and decrease the resulting immune response to those allergens (sneezing, itchy watery eyes, runny nose, nasal congestion, etc).    - Allergy  shots improve symptoms in 75-85% of patients.  - We can discuss more at the next appointment if the medications are not working  for you.  Gluten intolerance: - Continue to avoid gluten. - For mild symptoms you can take over the counter antihistamines such as Benadryl  and monitor symptoms closely.  - If symptoms worsen or if you have severe symptoms including breathing issues, throat closure, significant swelling, whole body hives, severe diarrhea and vomiting, lightheadedness then seek immediate medical care.  Return in about 6 months (around 05/03/2024).    Subjective:   Donna [name] is a 73 y.o. female presenting today for follow up of No chief complaint on file.   Donna [name] has a history of the following: Patient Active Problem List   Diagnosis Date Noted   Exposure to hepatitis B 07/01/2023   Need for immunization against viral hepatitis 07/01/2023   Screening for STDs (sexually transmitted diseases) 07/01/2023   History of substance use 07/01/2023   S/P appendectomy 05/11/2023   S/P laparoscopic appendectomy 05/11/2023   Gasping for breath 08/19/2021   PTSD (post-traumatic stress disorder) 08/19/2021   Parasomnia overlap disorder 08/19/2021   Snoring 08/19/2021   Excessive daytime sleepiness 08/19/2021   Sleep paralysis, recurrent isolated 08/19/2021   Asthma 02/19/2021   Multiple drug allergies 02/19/2021   Gluten intolerance 02/19/2021   Chronic rhinitis 02/19/2021   Metatarsalgia of left foot 11/14/2020   Achilles tendinosis of right lower extremity 11/14/2020   Essential tremor 07/04/2019   DOE (dyspnea on exertion) 01/20/2019   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 06/13/2018   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   Superficial basal cell carcinoma 06/13/2018   Upper airway cough syndrome 06/13/2018   Atrophic vaginitis  02/17/2018   Bilateral leg cramps 02/17/2018   Fatty liver, alcoholic 04/22/2016   Depression 04/19/2016   Withdrawal complaint 04/19/2016   Hx of adenomatous colonic polyps 08/03/2015   Herpes simplex 06/15/2014    GERD (gastroesophageal reflux disease) 10/14/2013   Anxiety state 10/14/2013   Recovering alcoholic in remission (HCC) 10/14/2013   Allergic rhinitis 10/14/2013   Adjustment disorder with anxiety 10/14/2013   Elevated low-density lipoprotein level 10/14/2013   Essential hypertension 10/15/2012   Heart murmur 05/25/2012    History obtained from: chart review and patient.  Discussed the use of AI scribe software for clinical note transcription with the patient and/or guardian, who gave verbal consent to proceed.  Donna [name] is a 73 y.o. female presenting for skin testing. She was last seen on May 13th. We could not do testing because her insurance company does not cover testing on the same day as a New Patient visit. She has been off of all antihistamines 3 days in anticipation of the testing.   At that visit, she had like testing which looked pretty good.  We continue with Symbicort  2 puffs twice daily and Xopenex  as needed.  For her rhinitis, she had negative skin testing in 2021.  Blood work was negative that we sent and we decided bring her in for intradermal testing.  She continue to avoid gluten due to her history of gluten intolerance.   She experiences respiratory symptoms that are more pronounced in the winter and when the heater is running. These symptoms are present year-round but worsen during these times. She suspects mold as a trigger for her symptoms, mentioning both indoor and outdoor exposures.  She recently traveled to [place], [place], where she experienced significant relief from her symptoms, including no need for her inhaler or Symbicort . During her stay, she did not experience any coughing or breathing difficulties.  She is currently not taking any medication for her allergies, having stopped Claritin due to its ineffectiveness. She has previously used Symbicort  intermittently but has not needed it since her trip to [facility].  Her husband has lung issues, and she suspects  mold in her crawl space at home. She is in the process of having it assessed by [employer]. Previously, she received a high estimate for mold remediation, but the initial company did not test for mold.  No postnasal drip except during pollen season in April. Her primary concern is breathing difficulties, which she describes as 'crazy' coughing.  Otherwise, there have been no changes to her past medical history, surgical history, family history, or social history.    Review of systems otherwise negative other than that mentioned in the HPI.    Objective:   There were no vitals taken for this visit. There is no height or weight on file to calculate BMI.    Physical exam deferred since this was a skin testing appointment only.   Diagnostic studies:   Allergy  Studies:     Intradermal - 11/02/23 1400     Time Antigen Placed 1416    Allergen Manufacturer Greer    Location Arm    Number of Test 16    Control Negative    [place] Negative    [place] Negative    [place] Negative    7 Grass Negative    Ragweed Mix Negative    Weed Mix Negative    Tree Mix 2+    Mold 1 2+    Mold 2 Negative    Mold 3 Negative  Mold 4 2+    Mite Mix Negative    Cat 1+    Dog Negative    Cockroach Negative          Allergy  testing results were read and interpreted by myself, documented by clinical staff.      Marty Shaggy, MD  [facility] 

## 2023-11-02 NOTE — Patient Instructions (Addendum)
 Asthma:  - Lung testing looked pretty good.  - Daily controller medication(s): NOTHING - Prior to physical activity: albuterol 2 puffs 10-15 minutes before physical activity. - Rescue medications: albuterol 4 puffs every 4-6 hours as needed - Changes during respiratory infections or worsening symptoms: Add on Symbicort  to 2 puffs twice daily for TWO WEEKS. - Asthma control goals:  * Full participation in all desired activities (may need albuterol before activity) * Albuterol use two time or less a week on average (not counting use with activity) * Cough interfering with sleep two time or less a month * Oral steroids no more than once a year * No hospitalizations  Chronic Rhinitis: - Testing today showed: trees, indoor molds, outdoor molds, and cat - Copy of test results provided.  - Avoidance measures provided. - Stop taking: Claritin - Start taking: Zyrtec (cetirizine) 10mg  tablet once daily - You can use an extra dose of the antihistamine, if needed, for breakthrough symptoms.  - Consider nasal saline rinses 1-2 times daily to remove allergens from the nasal cavities as well as help with mucous clearance (this is especially helpful to do before the nasal sprays are given) - Consider allergy  shots as a means of long-term control. - Allergy  shots re-train and reset the immune system to ignore environmental allergens and decrease the resulting immune response to those allergens (sneezing, itchy watery eyes, runny nose, nasal congestion, etc).    - Allergy  shots improve symptoms in 75-85% of patients.  - We can discuss more at the next appointment if the medications are not working for you.  Gluten intolerance: - Continue to avoid gluten. - For mild symptoms you can take over the counter antihistamines such as Benadryl  and monitor symptoms closely.  - If symptoms worsen or if you have severe symptoms including breathing issues, throat closure, significant swelling, whole body hives,  severe diarrhea and vomiting, lightheadedness then seek immediate medical care.  Return in about 6 months (around 05/03/2024).    Please inform us  of any Emergency Department visits, hospitalizations, or changes in symptoms. Call us  before going to the ED for breathing or allergy  symptoms since we might be able to fit you in for a sick visit. Feel free to contact us  anytime with any questions, problems, or concerns.  It was a pleasure to see you again today!  Websites that have reliable patient information: 1. American Academy of Asthma, Allergy , and Immunology: www.aaaai.org 2. Food Allergy  Research and Education (FARE): foodallergy.org 3. Mothers of Asthmatics: http://www.asthmacommunitynetwork.org 4. [employer] of Allergy , Asthma, and Immunology: www.acaai.org   COVID-19 Vaccine Information can be found at: [email or web address] For questions related to vaccine distribution or appointments, please email [email or web address] or call [phone].   We realize that you might be concerned about having an allergic reaction to the COVID19 vaccines. To help with that concern, WE ARE OFFERING THE COVID19 VACCINES IN OUR OFFICE! Ask the front desk for dates!     "Like" us  on Facebook and Instagram for our latest updates!      A healthy democracy works best when [employer] participate! Make sure you are registered to vote! If you have moved or changed any of your contact information, you will need to get this updated before voting!  In some cases, you MAY be able to register to vote online: [email or web address]     Intradermal - 11/02/23 1400     Time Antigen Placed 1416    Allergen Manufacturer Jestine    Location Arm  Number of Test 16    Control Negative    [place] Negative    [place] Negative    [place] Negative    7 Grass Negative    Ragweed Mix Negative    Weed Mix Negative    Tree  Mix 2+    Mold 1 2+    Mold 2 Negative    Mold 3 Negative    Mold 4 2+    Mite Mix Negative    Cat 1+    Dog Negative    Cockroach Negative          Reducing Pollen Exposure  The American Academy of Allergy , Asthma and Immunology suggests the following steps to reduce your exposure to pollen during allergy  seasons.    Do not hang sheets or clothing out to dry; pollen may collect on these items. Do not mow lawns or spend time around freshly cut grass; mowing stirs up pollen. Keep windows closed at night.  Keep car windows closed while driving. Minimize morning activities outdoors, a time when pollen counts are usually at their highest. Stay indoors as much as possible when pollen counts or humidity is high and on windy days when pollen tends to remain in the air longer. Use air conditioning when possible.  Many air conditioners have filters that trap the pollen spores. Use a HEPA room air filter to remove pollen form the indoor air you breathe.  Control of Mold Allergen   Mold and fungi can grow on a variety of surfaces provided certain temperature and moisture conditions exist.  Outdoor molds grow on plants, decaying vegetation and soil.  The major outdoor mold, Alternaria and Cladosporium, are found in very high numbers during hot and dry conditions.  Generally, a late Summer - Fall peak is seen for common outdoor fungal spores.  Rain will temporarily lower outdoor mold spore count, but counts rise rapidly when the rainy period ends.  The most important indoor molds are Aspergillus and Penicillium.  Dark, humid and poorly ventilated basements are ideal sites for mold growth.  The next most common sites of mold growth are the bathroom and the kitchen.  Outdoor (Seasonal) Mold Control  Positive outdoor molds via skin testing: Alternaria and Cladosporium  Use air conditioning and keep windows closed Avoid exposure to decaying vegetation. Avoid leaf raking. Avoid grain  handling. Consider wearing a face mask if working in moldy areas.    Indoor (Perennial) Mold Control   Positive indoor molds via skin testing: Fusarium, Aureobasidium (Pullulara), and Rhizopus  Maintain humidity below 50%. Clean washable surfaces with 5% bleach solution. Remove sources e.g. contaminated carpets.    Control of Dog or Cat Allergen  Avoidance is the best way to manage a dog or cat allergy . If you have a dog or cat and are allergic to dog or cats, consider removing the dog or cat from the home. If you have a dog or cat but don't want to find it a new home, or if your family wants a pet even though someone in the household is allergic, here are some strategies that may help keep symptoms at bay:  Keep the pet out of your bedroom and restrict it to only a few rooms. Be advised that keeping the dog or cat in only one room will not limit the allergens to that room. Don't pet, hug or kiss the dog or cat; if you do, wash your hands with soap and water. High-efficiency particulate air (HEPA) cleaners run continuously in  a bedroom or living room can reduce allergen levels over time. Regular use of a high-efficiency vacuum cleaner or a central vacuum can reduce allergen levels. Giving your dog or cat a bath at least once a week can reduce airborne allergen.    Allergy  Shots  Allergies are the result of a chain reaction that starts in the immune system. Your immune system controls how your body defends itself. For instance, if you have an allergy  to pollen, your immune system identifies pollen as an invader or allergen. Your immune system overreacts by producing antibodies called Immunoglobulin E (IgE). These antibodies travel to cells that release chemicals, causing an allergic reaction.  The concept behind allergy  immunotherapy, whether it is received in the form of shots or tablets, is that the immune system can be desensitized to specific allergens that trigger allergy  symptoms.  Although it requires time and patience, the payback can be long-term relief. Allergy  injections contain a dilute solution of those substances that you are allergic to based upon your skin testing and allergy  history.   How Do Allergy  Shots Work?  Allergy  shots work much like a vaccine. Your body responds to injected amounts of a particular allergen given in increasing doses, eventually developing a resistance and tolerance to it. Allergy  shots can lead to decreased, minimal or no allergy  symptoms.  There generally are two phases: build-up and maintenance. Build-up often ranges from three to six months and involves receiving injections with increasing amounts of the allergens. The shots are typically given once or twice a week, though more rapid build-up schedules are sometimes used.  The maintenance phase begins when the most effective dose is reached. This dose is different for each person, depending on how allergic you are and your response to the build-up injections. Once the maintenance dose is reached, there are longer periods between injections, typically two to four weeks.  Occasionally doctors give cortisone-type shots that can temporarily reduce allergy  symptoms. These types of shots are different and should not be confused with allergy  immunotherapy shots.  Who Can Be Treated with Allergy  Shots?  Allergy  shots may be a good treatment approach for people with allergic rhinitis (hay fever), allergic asthma, conjunctivitis (eye allergy ) or stinging insect allergy .   Before deciding to begin allergy  shots, you should consider:   The length of allergy  season and the severity of your symptoms  Whether medications and/or changes to your environment can control your symptoms  Your desire to avoid long-term medication use  Time: allergy  immunotherapy requires a major time commitment  Cost: may vary depending on your insurance coverage  Allergy  shots for children age 12 and older are  effective and often well tolerated. They might prevent the onset of new allergen sensitivities or the progression to asthma.  Allergy  shots are not started on patients who are pregnant but can be continued on patients who become pregnant while receiving them. In some patients with other medical conditions or who take certain common medications, allergy  shots may be of risk. It is important to mention other medications you talk to your allergist.   What are the two types of build-ups offered:   RUSH or Rapid Desensitization -- one day of injections lasting from 8:30-4:30pm, injections every 1 hour.  Approximately half of the build-up process is completed in that one day.  The following week, normal build-up is resumed, and this entails ~16 visits either weekly or twice weekly, until reaching your "maintenance dose" which is continued weekly until eventually getting spaced  out to every month for a duration of 3 to 5 years. The regular build-up appointments are nurse visits where the injections are administered, followed by required monitoring for 30 minutes.    Traditional build-up -- weekly visits for 6 -12 months until reaching "maintenance dose", then continue weekly until eventually spacing out to every 4 weeks as above. At these appointments, the injections are administered, followed by required monitoring for 30 minutes.     Either way is acceptable, and both are equally effective. With the rush protocol, the advantage is that less time is spent here for injections overall AND you would also reach maintenance dosing faster (which is when the clinical benefit starts to become more apparent). Not everyone is a candidate for rapid desensitization.   IF we proceed with the RUSH protocol, there are premedications which must be taken the day before and the day after the rush only (this includes antihistamines, steroids, and Singulair ).  After the rush day, no prednisone or Singulair  is required, and we  just recommend antihistamines taken on your injection day.  What Is An Estimate of the Costs?  If you are interested in starting allergy  injections, please check with your insurance company about your coverage for both allergy  vial sets and allergy  injections.  Please do so prior to making the appointment to start injections.  The following are CPT codes to give to your insurance company. These are the amounts we BILL to the insurance company, but the amount YOU WILL PAY and WE RECEIVE IS SUBSTANTIALLY LESS and depends on the contracts we have with different insurance companies.   Amount Billed to Insurance One allergy  vial set  CPT 95165   $ 1200     Two allergy  vial set  CPT 95165   $ 2400     Three allergy  vial set  CPT 95165   $ 3600     One injection   CPT 95115   $ 35  Two injections   CPT 95117   $ 40 RUSH (Rapid Desensitization) CPT 95180 x 8 hours $500/hour  Regarding the allergy  injections, your co-pay may or may not apply with each injection, so please confirm this with your insurance company. When you start allergy  injections, 1 or 2 sets of vials are made based on your allergies.  Not all patients can be on one set of vials. A set of vials lasts 6 months to a year depending on how quickly you can proceed with your build-up of your allergy  injections. Vials are personalized for each patient depending on their specific allergens.  How often are allergy  injection given during the build-up period?   Injections are given at least weekly during the build-up period until your maintenance dose is achieved. Per the doctor's discretion, you may have the option of getting allergy  injections two times per week during the build-up period. However, there must be at least 48 hours between injections. The build-up period is usually completed within 6-12 months depending on your ability to schedule injections and for adjustments for reactions. When maintenance dose is reached, your injection schedule  is gradually changed to every two weeks and later to every three weeks. Injections will then continue every 4 weeks. Usually, injections are continued for a total of 3-5 years.   When Will I Feel Better?  Some may experience decreased allergy  symptoms during the build-up phase. For others, it may take as long as 12 months on the maintenance dose. If there is no improvement after  a year of maintenance, your allergist will discuss other treatment options with you.  If you aren't responding to allergy  shots, it may be because there is not enough dose of the allergen in your vaccine or there are missing allergens that were not identified during your allergy  testing. Other reasons could be that there are high levels of the allergen in your environment or major exposure to non-allergic triggers like tobacco smoke.  What Is the Length of Treatment?  Once the maintenance dose is reached, allergy  shots are generally continued for three to five years. The decision to stop should be discussed with your allergist at that time. Some people may experience a permanent reduction of allergy  symptoms. Others may relapse and a longer course of allergy  shots can be considered.  What Are the Possible Reactions?  The two types of adverse reactions that can occur with allergy  shots are local and systemic. Common local reactions include very mild redness and swelling at the injection site, which can happen immediately or several hours after. Report a delayed reaction from your last injection. These include arm swelling or runny nose, watery eyes or cough that occurs within 12-24 hours after injection. A systemic reaction, which is less common, affects the entire body or a particular body system. They are usually mild and typically respond quickly to medications. Signs include increased allergy  symptoms such as sneezing, a stuffy nose or hives.   Rarely, a serious systemic reaction called anaphylaxis can develop. Symptoms  include swelling in the throat, wheezing, a feeling of tightness in the chest, nausea or dizziness. Most serious systemic reactions develop within 30 minutes of allergy  shots. This is why it is strongly recommended you wait in your doctor's office for 30 minutes after your injections. Your allergist is trained to watch for reactions, and his or her staff is trained and equipped with the proper medications to identify and treat them.   Report to the nurse immediately if you experience any of the following symptoms: swelling, itching or redness of the skin, hives, watery eyes/nose, breathing difficulty, excessive sneezing, coughing, stomach pain, diarrhea, or light headedness. These symptoms may occur within 15-20 minutes after injection and may require medication.   Who Should Administer Allergy  Shots?  The preferred location for receiving shots is your prescribing allergist's office. Injections can sometimes be given at another facility where the physician and staff are trained to recognize and treat reactions, and have received instructions by your prescribing allergist.  What if I am late for an injection?   Injection dose will be adjusted depending upon how many days or weeks you are late for your injection.   What if I am sick?   Please report any illness to the nurse before receiving injections. She may adjust your dose or postpone injections depending on your symptoms. If you have fever, flu, sinus infection or chest congestion it is best to postpone allergy  injections until you are better. Never get an allergy  injection if your asthma is causing you problems. If your symptoms persist, seek out medical care to get your health problem under control.  What If I am or Become Pregnant:  Women that become pregnant should schedule an appointment with [facility] before receiving any further allergy  injections.

## 2023-11-03 ENCOUNTER — Ambulatory Visit: Admitting: Dermatology

## 2023-11-03 ENCOUNTER — Encounter: Payer: Self-pay | Admitting: Dermatology

## 2023-11-03 VITALS — BP 143/93 | HR 66

## 2023-11-03 DIAGNOSIS — L814 Other melanin hyperpigmentation: Secondary | ICD-10-CM | POA: Diagnosis not present

## 2023-11-03 DIAGNOSIS — B351 Tinea unguium: Secondary | ICD-10-CM

## 2023-11-03 DIAGNOSIS — L578 Other skin changes due to chronic exposure to nonionizing radiation: Secondary | ICD-10-CM

## 2023-11-03 DIAGNOSIS — D485 Neoplasm of uncertain behavior of skin: Secondary | ICD-10-CM

## 2023-11-03 DIAGNOSIS — D492 Neoplasm of unspecified behavior of bone, soft tissue, and skin: Secondary | ICD-10-CM | POA: Diagnosis not present

## 2023-11-03 DIAGNOSIS — Z1283 Encounter for screening for malignant neoplasm of skin: Secondary | ICD-10-CM

## 2023-11-03 DIAGNOSIS — Z85828 Personal history of other malignant neoplasm of skin: Secondary | ICD-10-CM | POA: Diagnosis not present

## 2023-11-03 DIAGNOSIS — L239 Allergic contact dermatitis, unspecified cause: Secondary | ICD-10-CM

## 2023-11-03 DIAGNOSIS — W908XXA Exposure to other nonionizing radiation, initial encounter: Secondary | ICD-10-CM | POA: Diagnosis not present

## 2023-11-03 DIAGNOSIS — Z808 Family history of malignant neoplasm of other organs or systems: Secondary | ICD-10-CM

## 2023-11-03 DIAGNOSIS — L570 Actinic keratosis: Secondary | ICD-10-CM

## 2023-11-03 DIAGNOSIS — D1801 Hemangioma of skin and subcutaneous tissue: Secondary | ICD-10-CM

## 2023-11-03 DIAGNOSIS — D229 Melanocytic nevi, unspecified: Secondary | ICD-10-CM

## 2023-11-03 DIAGNOSIS — L821 Other seborrheic keratosis: Secondary | ICD-10-CM

## 2023-11-03 MED ORDER — TRIAMCINOLONE ACETONIDE 0.1 % EX OINT: 1.0000 | TOPICAL_OINTMENT | Freq: Every day | CUTANEOUS | 1 refills | Status: DC | PRN

## 2023-11-03 NOTE — Patient Instructions (Addendum)
 Important Information  Due to recent changes in healthcare laws, you may see results of your pathology and/or laboratory studies on MyChart before the doctors have had a chance to review them. We understand that in some cases there may be results that are confusing or concerning to you. Please understand that not all results are received at the same time and often the doctors may need to interpret multiple results in order to provide you with the best plan of care or course of treatment. Therefore, we ask that you please give us  2 business days to thoroughly review all your results before contacting the office for clarification. Should we see a critical lab result, you will be contacted sooner.   If You Need Anything After Your Visit  If you have any questions or concerns for your doctor, please call our main line at [phone] If no one answers, please leave a voicemail as directed and we will return your call as soon as possible. Messages left after 4 pm will be answered the following business day.   You may also send us  a message via MyChart. We typically respond to MyChart messages within 1-2 business days.  For prescription refills, please ask your pharmacy to contact our office. Our fax number is [phone].  If you have an urgent issue when the clinic is closed that cannot wait until the next business day, you can page your doctor at the number below.    Please note that while we do our best to be available for urgent issues outside of office hours, we are not available 24/7.   If you have an urgent issue and are unable to reach us , you may choose to seek medical care at your doctor's office, retail clinic, urgent care center, or emergency room.  If you have a medical emergency, please immediately call 911 or go to the emergency department. In the event of inclement weather, please call our main line at [phone] for an update on the status of any delays or  closures.  Dermatology Medication Tips: Please keep the boxes that topical medications come in in order to help keep track of the instructions about where and how to use these. Pharmacies typically print the medication instructions only on the boxes and not directly on the medication tubes.   If your medication is too expensive, please contact our office at [phone] or send us  a message through MyChart.   We are unable to tell what your co-pay for medications will be in advance as this is different depending on your insurance coverage. However, we may be able to find a substitute medication at lower cost or fill out paperwork to get insurance to cover a needed medication.   If a prior authorization is required to get your medication covered by your insurance company, please allow us  1-2 business days to complete this process.  Drug prices often vary depending on where the prescription is filled and some pharmacies may offer cheaper prices.  The website www.goodrx.com contains coupons for medications through different pharmacies. The prices here do not account for what the cost may be with help from insurance (it may be cheaper with your insurance), but the website can give you the price if you did not use any insurance.  - You can print the associated coupon and take it with your prescription to the pharmacy.  - You may also stop by our office during regular business hours and pick up a GoodRx coupon card.  - If  you need your prescription sent electronically to a different pharmacy, notify our office through [facility] or by phone at [phone]  I counseled the patient regarding the following: Skin Care: Actinic Damage can improve with broad spectrum sunscreen, sun avoidance, bleaching creams, retinoids, chemical peels and laser. Expectations: Actinic Damage is photo-aging from excessive sun exposure. It manifests as unwanted pigmentation, wrinkles and textural thinning of the  skin.  I recommended the following: Broad Spectrum Sunscreen SPF 30+ - SPF 30 daily to face, neck, chest and hands, reapplying every 3 hours when outside for long periods of time  Patient Handout: Wound Care for Skin Biopsy Site  Taking Care of Your Skin Biopsy Site  Proper care of the biopsy site is essential for promoting healing and minimizing scarring. This handout provides instructions on how to care for your biopsy site to ensure optimal recovery.  1. Cleaning the Wound:  Clean the biopsy site daily with gentle soap and water. Gently pat the area dry with a clean, soft towel. Avoid harsh scrubbing or rubbing the area, as this can irritate the skin and delay healing.  2. Applying Aquaphor and Bandage:  After cleaning the wound, apply a thin layer of Aquaphor ointment to the biopsy site. Cover the area with a sterile bandage to protect it from dirt, bacteria, and friction. Change the bandage daily or as needed if it becomes soiled or wet.  3. Continued Care for One Week:  Repeat the cleaning, Aquaphor application, and bandaging process daily for one week following the biopsy procedure. Keeping the wound clean and moist during this initial healing period will help prevent infection and promote optimal healing.  4. Massaging Aquaphor into the Area:  ---After one week, discontinue the use of bandages but continue to apply Aquaphor to the biopsy site. ----Gently massage the Aquaphor into the area using circular motions. ---Massaging the skin helps to promote circulation and prevent the formation of scar tissue.   Additional Tips:  Avoid exposing the biopsy site to direct sunlight during the healing process, as this can cause hyperpigmentation or worsen scarring. If you experience any signs of infection, such as increased redness, swelling, warmth, or drainage from the wound, contact your healthcare provider immediately. Follow any additional instructions provided by your  healthcare provider for caring for the biopsy site and managing any discomfort. Conclusion:  Taking proper care of your skin biopsy site is crucial for ensuring optimal healing and minimizing scarring. By following these instructions for cleaning, applying Aquaphor, and massaging the area, you can promote a smooth and successful recovery. If you have any questions or concerns about caring for your biopsy site, don't hesitate to contact your healthcare provider for guidance.  Liquid nitrogen was applied for 10-12 seconds to the skin lesion and the expected blistering or scabbing reaction explained. Do not pick at the area. Patient reminded to expect hypopigmented scars from the procedure. Return if lesion fails to fully resolve.

## 2023-11-03 NOTE — Progress Notes (Signed)
 Total Body Skin Exam (TBSE) Visit   Subjective  Donna [name] is a 73 y.o. female who presents for the following: Skin Cancer Screening and Full Body Skin Exam  Patient presents today for follow up visit for TBSE. Patient was last evaluated on 08/12/23 . Patient denies medication changes. Patient reports she does have spots, moles and lesions of concern to be evaluated. Patient reports throughout her lifetime she has had moderate sun exposure. Currently, patient reports if she has excessive sun exposure, she does apply sunscreen and/or wears protective coverings. Patient reports she has hx of bx. Patient reports  family history of skin cancers. The patient has spots, moles and lesions to be evaluated, some may be new or changing and the patient has concerns that these could be cancer.  The following portions of the chart were reviewed this encounter and updated as appropriate: medications, allergies, medical history  Review of Systems:  No other skin or systemic complaints except as noted in HPI or Assessment and Plan.  Objective  Well appearing patient in no apparent distress; mood and affect are within normal limits.  A full examination was performed including scalp, head, eyes, ears, nose, lips, neck, chest, axillae, abdomen, back, buttocks, bilateral upper extremities, bilateral lower extremities, hands, feet, fingers, toes, fingernails, and toenails. All findings within normal limits unless otherwise noted below.   Relevant physical exam findings are noted in the Assessment and Plan.  Right Lower Leg - Anterior 6mm pink scaly papule   Left Lower Leg - Anterior 6mm pink scaly papule    Assessment & Plan   LENTIGINES, SEBORRHEIC KERATOSES, HEMANGIOMAS - Benign normal skin lesions - Benign-appearing - Call for any changes  MELANOCYTIC NEVI - Tan-brown and/or pink-flesh-colored symmetric macules and papules - Benign appearing on exam today - Observation - Call clinic for new  or changing moles - Recommend daily use of broad spectrum spf 30+ sunscreen to sun-exposed areas.   ACTINIC DAMAGE - Chronic condition, secondary to cumulative UV/sun exposure - diffuse scaly erythematous macules with underlying dyspigmentation - Recommend daily broad spectrum sunscreen SPF 30+ to sun-exposed areas, reapply every 2 hours as needed.  - Staying in the shade or wearing long sleeves, sun glasses (UVA+UVB protection) and wide brim hats (4-inch brim around the entire circumference of the hat) are also recommended for sun protection.  - Call for new or changing lesions.  HISTORY OF SQUAMOUS CELL CARCINOMA OF THE SKIN - No evidence of recurrence today - No lymphadenopathy - Recommend regular full body skin exams - Recommend daily broad spectrum sunscreen SPF 30+ to sun-exposed areas, reapply every 2 hours as needed.  - Call if any new or changing lesions are noted between office visits  HISTORY OF BASAL CELL CARCINOMA OF THE SKIN - No evidence of recurrence today - Recommend regular full body skin exams - Recommend daily broad spectrum sunscreen SPF 30+ to sun-exposed areas, reapply every 2 hours as needed.  - Call if any new or changing lesions are noted between office visits    ONYCHOMYCOSIS Exam: Thickened toenails with subungal debris c/w onychomycosis  - Assessment: Patient has been using Jublia  since last visit and states she has seen a big improvement.  She had previously tried antifungal medications for toenail fungus for approximately 14 months with some improvement, but progress was slow. Previous treatment with ciclopirox was ineffective. Fungal infection confirmed to be external, caused by dermatophytes, and not related to any internal factors.   Treatment Plan: -Continue to using  Jublia  on top and around affected nails. -Carefully trim affected nails as they grow out  -Use separate nail clippers for affected and unaffected nails  Benign-appearing.  Observation.   Patient deferred treatment at this time.  SKIN CANCER SCREENING PERFORMED TODAY.  ALLERGIC CONTACT DERMATITIS Exam: scaly pink papules and/or plaques +/- vesiculation  Flared   Treatment Plan: -Apply Triamcinolone  2x daily when flaired    ACTINIC KERATOSIS Exam: Erythematous thin papules/macules with gritty scale  Actinic keratoses are precancerous spots that appear secondary to cumulative UV radiation exposure/sun exposure over time. They are chronic with expected duration over 1 year. A portion of actinic keratoses will progress to squamous cell carcinoma of the skin. It is not possible to reliably predict which spots will progress to skin cancer and so treatment is recommended to prevent development of skin cancer.  Recommend daily broad spectrum sunscreen SPF 30+ to sun-exposed areas, reapply every 2 hours as needed.  Recommend staying in the shade or wearing long sleeves, sun glasses (UVA+UVB protection) and wide brim hats (4-inch brim around the entire circumference of the hat). Call for new or changing lesions.  Treatment Plan:  Prior to procedure, discussed risks of blister formation, small wound, skin dyspigmentation, or rare scar following cryotherapy. Recommend Vaseline ointment to treated areas while healing.  Destruction Procedure Note Destruction method: cryotherapy   Informed consent: discussed and consent obtained   Lesion destroyed using liquid nitrogen: Yes   Outcome: patient tolerated procedure well with no complications   Post-procedure details: wound care instructions given   Locations: 3 on nose, 1 on right lip, 1 on right cheek, 2 left lower leg # of Lesions Treated: 7    NEOPLASM OF UNCERTAIN BEHAVIOR OF SKIN (2) Right Lower Leg - Anterior Skin / nail biopsy Type of biopsy: tangential   Informed consent: discussed and consent obtained   Timeout: patient name, date of birth, surgical site, and procedure verified   Procedure prep:  Patient was prepped and  draped in usual sterile fashion Prep type:  Isopropyl alcohol Anesthesia: the lesion was anesthetized in a standard fashion   Anesthetic:  1% lidocaine  w/ epinephrine  1-100,000 buffered w/ 8.4% NaHCO3 Instrument used: DermaBlade   Hemostasis achieved with: aluminum chloride   Outcome: patient tolerated procedure well   Post-procedure details: sterile dressing applied and wound care instructions given   Dressing type: petrolatum gauze and bandage   Specimen 1 - Surgical pathology Differential Diagnosis: r/o SCC vs other  Check Margins: No Left Lower Leg - Anterior Skin / nail biopsy Type of biopsy: tangential   Informed consent: discussed and consent obtained   Timeout: patient name, date of birth, surgical site, and procedure verified   Procedure prep:  Patient was prepped and draped in usual sterile fashion Prep type:  Isopropyl alcohol Anesthesia: the lesion was anesthetized in a standard fashion   Anesthetic:  1% lidocaine  w/ epinephrine  1-100,000 buffered w/ 8.4% NaHCO3 Instrument used: DermaBlade   Hemostasis achieved with: aluminum chloride   Outcome: patient tolerated procedure well   Post-procedure details: sterile dressing applied and wound care instructions given   Dressing type: petrolatum gauze and bandage   Specimen 2 - Surgical pathology Differential Diagnosis: r/o SCC vs other  Check Margins: No No follow-ups on file.  IBerwyn Lesches, Surg Tech III, am acting as scribe for [employer], DO.   Documentation: I have reviewed the above documentation for accuracy and completeness, and I agree with the above.  Delon Lenis, DO

## 2023-11-04 LAB — SURGICAL PATHOLOGY

## 2023-11-05 ENCOUNTER — Ambulatory Visit: Payer: Self-pay | Admitting: Dermatology

## 2023-11-05 ENCOUNTER — Ambulatory Visit
Admission: EM | Admit: 2023-11-05 | Discharge: 2023-11-05 | Disposition: A | Discharge status: Home / Self Care | Attending: Family Medicine | Admitting: Family Medicine

## 2023-11-05 ENCOUNTER — Other Ambulatory Visit: Payer: Self-pay

## 2023-11-05 DIAGNOSIS — W5501XA Bitten by cat, initial encounter: Secondary | ICD-10-CM

## 2023-11-05 DIAGNOSIS — S61452A Open bite of left hand, initial encounter: Secondary | ICD-10-CM

## 2023-11-05 MED ORDER — AMOXICILLIN-POT CLAVULANATE 875-125 MG PO TABS: 1.0000 | ORAL_TABLET | Freq: Two times a day (BID) | ORAL | 0 refills | Status: DC

## 2023-11-05 NOTE — ED Triage Notes (Signed)
 Pt states she scared her own cat and it bit her on the posterior of her left hand last night. Pt states she put antibiotic ointment and a Band-Aid on her wound. Pt states it's painful. Bleeding is controlled. Pt states he cat has all of it's immunizations

## 2023-11-05 NOTE — Discharge Instructions (Addendum)
 Keep wound clean and dry.  Start Augmentin twice daily for 7 days.  Monitor for any signs of infection which include but not limited to redness, drainage, swelling, warmth, fevers or chills and please seek reevaluation in the emergency room if these occur.  Follow-up with your PCP 2 to 3 days for recheck.  Hope you feel better soon!

## 2023-11-05 NOTE — ED Provider Notes (Addendum)
 [facility]    CSN: [id number] Arrival date & time: 11/05/23  0801      History   Chief Complaint No chief complaint on file.   HPI Donna [name] is a 73 y.o. female presents with cat bite.  Patient reports last night she accidentally startled her own cat causing it to bite the top of her left hand.  She states she cleaned it with soap and water and alcohol and has been applying mupirocin  to it.  She reports some tenderness to the area but denies any drainage, swelling, fevers or chills.  No history of MRSA.  States the cat is up-to-date on its rabies and patient is up-to-date on her tetanus.  No other injuries or concerns at this time.  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   chest - CX3 + 5FU   Squamous cell carcinoma of skin 09/27/2014   left thigh - tx p bx   Substance abuse (HCC)     Patient Active Problem List   Diagnosis Date Noted   Exposure to hepatitis B 07/01/2023   Need for immunization against viral hepatitis 07/01/2023   Screening for STDs (sexually transmitted diseases) 07/01/2023   History of substance use 07/01/2023   S/P appendectomy 05/11/2023   S/P laparoscopic appendectomy 05/11/2023   Gasping for breath 08/19/2021   PTSD (post-traumatic stress disorder) 08/19/2021   Parasomnia overlap disorder 08/19/2021   Snoring 08/19/2021   Excessive daytime sleepiness 08/19/2021   Sleep paralysis,  recurrent isolated 08/19/2021   Asthma 02/19/2021   Multiple drug allergies 02/19/2021   Gluten intolerance 02/19/2021   Chronic rhinitis 02/19/2021   Metatarsalgia of left foot 11/14/2020   Achilles tendinosis of right lower extremity 11/14/2020   Essential tremor 07/04/2019   DOE (dyspnea on exertion) 01/20/2019   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 06/13/2018   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   Superficial basal cell carcinoma 06/13/2018   Upper airway cough syndrome 06/13/2018   Atrophic vaginitis 02/17/2018   Bilateral leg cramps 02/17/2018   Fatty liver, alcoholic 04/22/2016   Depression 04/19/2016   Withdrawal complaint 04/19/2016   Hx of adenomatous colonic polyps 08/03/2015   Herpes simplex 06/15/2014   GERD (gastroesophageal reflux disease) 10/14/2013   Anxiety state 10/14/2013   Recovering alcoholic in remission (HCC) 10/14/2013   Allergic rhinitis 10/14/2013   Adjustment disorder with anxiety 10/14/2013   Elevated low-density lipoprotein level 10/14/2013   Essential hypertension 10/15/2012   Heart murmur 05/25/2012    Past Surgical History:  Procedure Laterality Date   ANKLE SURGERY     Bilateral    CATARACT EXTRACTION Bilateral    CERVICAL SPINE SURGERY  07/21/2021   LAPAROSCOPIC APPENDECTOMY N/A 05/11/2023   Procedure: APPENDECTOMY LAPAROSCOPIC;  Surgeon: Sebastian Moles, MD;  Location: [facility] OR;  Service: General;  Laterality: N/A;   TUBAL LIGATION      OB History   No obstetric history on file.      Home Medications    Prior to Admission medications   Medication Sig Start Date End Date Taking? Authorizing Provider  amoxicillin-clavulanate (AUGMENTIN) 875-125 MG tablet Take 1 tablet by mouth every 12 (twelve) hours. 11/05/23  Yes Allyanna Appleman, Jodi R, NP  albuterol (VENTOLIN HFA) 108 (90 Base) MCG/ACT inhaler Inhale 2 puffs into the lungs every 4 (four) hours as needed. 08/04/21   [provider]  b  complex vitamins tablet Take 1 tablet by mouth daily.      [provider]  budesonide -formoterol  (SYMBICORT ) 80-4.5 MCG/ACT inhaler Inhale 2 puffs into the lungs every 6 (six) hours as needed. 09/22/23   Hope Almarie ORN, NP  busPIRone (BUSPAR) 5 MG tablet Take 5 mg by mouth daily.    [provider]  calcium  gluconate 500 MG tablet Take 500 mg by mouth daily.    [provider]  Efinaconazole  (JUBLIA ) 10 % SOLN Apply 1 application  topically at bedtime. 08/12/23   Alm Delon SAILOR, DO  Estradiol  (YUVAFEM ) 10 MCG TABS vaginal tablet Place 1 tablet vaginally once a week. On Sundays    [provider]  lisinopril  (ZESTRIL ) 5 MG tablet TAKE 1 TABLET BY MOUTH DAILY FOR ELEVATED BP WHILE TRAVELING 11/05/19   Perri Ronal PARAS, MD  Magnesium 500 MG TABS Take 1 tablet by mouth daily.      [provider]  Multiple Vitamin (MULTIVITAMIN) capsule Take 1 capsule by mouth daily.      [provider]  mupirocin  ointment (BACTROBAN ) 2 % Apply 1 Application topically 2 (two) times daily. 10/28/23   Paci, Karina M, MD  oxyCODONE  (OXY IR/ROXICODONE ) 5 MG immediate release tablet Take 1 tablet (5 mg total) by mouth every 6 (six) hours as needed for up to 8 doses. 10/28/23   Paci, Karina M, MD  propranolol  (INDERAL ) 10 MG tablet Take 10 mg by mouth as needed. 12/02/20   [provider]  triamcinolone  ointment (KENALOG ) 0.1 % Apply 1 Application topically daily as needed. 11/03/23   Alm Delon SAILOR, DO  umeclidinium bromide  (INCRUSE ELLIPTA ) 62.5 MCG/ACT AEPB Inhale 1 puff into the lungs daily. 09/22/23   Hope Almarie ORN, NP  valACYclovir  (VALTREX ) 500 MG tablet ONE BY MOUTH TWICE A DAY X 5 DAYS FOR HERPES OUTBREAK 11/25/19   Baxley, Ronal PARAS, MD    Family History Family History  Problem Relation Age of Onset   Dementia Mother    Diabetes Mother    Melanoma Father    Colon cancer Father 38       Survived   Heart disease Father    Heart attack Father     Rheum arthritis Father    Dementia Father    Healthy Sister    Healthy Brother    Dementia Maternal Aunt    Dementia Maternal Uncle    Colon polyps Paternal Aunt    Pancreatic cancer Paternal Aunt    Rectal cancer Paternal Aunt    Colon cancer Paternal Aunt    Colon cancer Paternal Grandmother    Breast cancer Maternal Grandmother    Stomach cancer Neg Hx    Esophageal cancer Neg Hx    Liver cancer Neg Hx     Social History Social History   Tobacco Use   Smoking status: Former    Current packs/day:  0.00    Types: Cigarettes    Start date: 10/16/1966    Quit date: 10/15/1996    Years since quitting: 27.0   Smokeless tobacco: Never  Vaping Use   Vaping status: Never Used  Substance Use Topics   Alcohol use: No   Drug use: No     Allergies   Ciprofloxacin, Corticosteroids, Doxycycline, Gluten meal, Incruse ellipta  [umeclidinium bromide ], Montelukast , Penicillins, Prednisone, Quinolones, and Statins   Review of Systems Review of Systems  Skin:  Positive for wound.     Physical Exam Triage Vital Signs ED Triage Vitals  Encounter Vitals Group     BP 11/05/23 0818 (!) 142/83     Girls Systolic BP Percentile --      Girls Diastolic BP Percentile --      Boys Systolic BP Percentile --      Boys Diastolic BP Percentile --      Pulse Rate 11/05/23 0818 63     Resp 11/05/23 0818 16     Temp 11/05/23 0818 (!) 97.5 F (36.4 C)     Temp Source 11/05/23 0818 Oral     SpO2 11/05/23 0818 98 %     Weight --      Height --      Head Circumference --      Peak Flow --      Pain Score 11/05/23 0814 2     Pain Loc --      Pain Education --      Exclude from Growth Chart --    No data found.  Updated Vital Signs BP (!) 142/83   Pulse 63   Temp (!) 97.5 F (36.4 C) (Oral)   Resp 16   SpO2 98%   Visual Acuity Right Eye Distance:   Left Eye Distance:   Bilateral Distance:    Right Eye Near:   Left Eye Near:    Bilateral Near:     Physical Exam Vitals and  nursing note reviewed.  Constitutional:      General: She is not in acute distress.    Appearance: Normal appearance. She is not ill-appearing.  HENT:     Head: Normocephalic and atraumatic.   Eyes:     Pupils: Pupils are equal, round, and reactive to light.    Cardiovascular:     Rate and Rhythm: Normal rate.  Pulmonary:     Effort: Pulmonary effort is normal.   Musculoskeletal:       Hands:     Comments: Single puncture wound to the dorsum of the left hand.  Mildly tender with minimal erythema without swelling drainage or warmth.  See photo.   Skin:    General: Skin is warm and dry.   Neurological:     General: No focal deficit present.     Mental Status: She is alert and oriented to person, place, and time.   Psychiatric:        Mood and Affect: Mood normal.        Behavior: Behavior normal.      UC Treatments / Results  Labs (all labs ordered are listed, but only abnormal results are displayed) Labs Reviewed - No data to display   Comprehensive metabolic panel Order: [id number]  Status: Final result     Next appt: 11/22/2023 at 10:15 AM in Dermatology ABBEY CHRISTELLA HOLY, MD)   Test Result Released: Yes (seen)   0 Result Notes          Component Ref  Range & Units (hover) 3 mo ago (08/01/23) 3 mo ago (07/19/23) 5 mo ago (05/12/23) 5 mo ago (05/11/23) 1 yr ago (07/24/22) 2 yr ago (02/02/21) 3 yr ago (07/02/20)  Sodium 132 Low  137 R 130 Low  131 Low  137 R 135 136 R  Potassium 4.0 4.3 R 3.8 4.0 4.5 R 3.9 4.4 R  Chloride 101 99 R 98 94 Low  100 R 103 100 R  CO2 22 23 R 25 30 24  R 25 28 R  Glucose, Bld 107 High  91 112 High  CM 109 High  CM 91 118 High  CM 102 High  R, CM  Comment: Glucose reference range applies only to samples taken after fasting for at least 8 hours.  BUN 12 10 R 12 18 11  R 12 15 R  Creatinine, Ser 0.61 0.68 R 0.80 0.71 0.77 R 0.74 0.73 R, CM  Calcium  8.8 Low  9.1 R 8.4 Low  9.2 9.0 R 8.9 9.3 R  Total Protein 6.5 6.4 R  7.0   6.6 R  Albumin  3.7 4.1 R  4.3     AST 20 23 R  19   22 R  ALT 16 17 R  14   21 R  Alkaline Phosphatase 63 77 R  55     Total Bilirubin 0.8 0.4  0.9   0.6 R  GFR, Estimated >60  >60 CM >60 CM  >60 CM   Comment: (NOTE) Calculated using the CKD-EPI Creatinine Equation (2021)  Anion gap 9  7 CM 7 CM  7 CM   Comment: Performed at [facility], [address] [place], [place] [address]  Resulting Agency [facility]        Specimen Collected: 08/01/23 09:42 Last Resulted: 08/01/23 10:23    EKG   Radiology No results found.  Procedures Procedures (including critical care time)  Medications Ordered in UC Medications - No data to display  Initial Impression / Assessment and Plan / UC Course  I have reviewed the triage vital signs and the nursing notes.  Pertinent labs & imaging results that were available during my care of the patient were reviewed by me and considered in my medical decision making (see chart for details).     Reviewed exam and symptoms with patient.  No red flags.  Wound was cleansed and dressed by nursing staff.  Will start Augmentin twice daily for 7 days.  Patient is up-to-date with tetanus and cat is up-to-date on rabies.  Discussed wound care/signs and symptoms of infection.  ER precautions reviewed.  PCP follow-up 2 to 3 days for recheck. Final Clinical Impressions(s) / UC Diagnoses   Final diagnoses:  Cat bite of left hand, initial encounter     Discharge Instructions      Keep wound clean and dry.  Start Augmentin twice daily for 7 days.  Monitor for any signs of infection which include but not limited to redness, drainage, swelling, warmth, fevers or chills and please seek reevaluation in the emergency room if these occur.  Follow-up with your PCP 2 to 3 days for recheck.  Hope you feel better soon!    ED Prescriptions     Medication Sig Dispense Auth. Provider   amoxicillin-clavulanate  (AUGMENTIN) 875-125 MG tablet Take 1 tablet by mouth every 12 (twelve) hours. 14 tablet Mikko Lewellen, Jodi R, NP  PDMP not reviewed this encounter.   Loreda Myla SAUNDERS, NP 11/05/23 0831    Loreda Myla SAUNDERS, NP 11/05/23 [phone]

## 2023-11-09 ENCOUNTER — Encounter: Admitting: Dermatology

## 2023-11-10 ENCOUNTER — Encounter: Admitting: Dermatology

## 2023-11-10 ENCOUNTER — Encounter: Payer: Self-pay | Admitting: Dermatology

## 2023-11-22 ENCOUNTER — Encounter: Payer: Self-pay | Admitting: Dermatology

## 2023-11-22 ENCOUNTER — Ambulatory Visit: Admitting: Dermatology

## 2023-11-22 DIAGNOSIS — S81801A Unspecified open wound, right lower leg, initial encounter: Secondary | ICD-10-CM | POA: Diagnosis not present

## 2023-11-22 DIAGNOSIS — Z8589 Personal history of malignant neoplasm of other organs and systems: Secondary | ICD-10-CM

## 2023-11-22 DIAGNOSIS — C4492 Squamous cell carcinoma of skin, unspecified: Secondary | ICD-10-CM

## 2023-11-22 DIAGNOSIS — T1490XD Injury, unspecified, subsequent encounter: Secondary | ICD-10-CM

## 2023-11-22 DIAGNOSIS — Z85828 Personal history of other malignant neoplasm of skin: Secondary | ICD-10-CM

## 2023-11-22 NOTE — Progress Notes (Signed)
   Follow Up Visit   Subjective  Donna [name] is a 73 y.o. female who presents for the following: follow up from Mohs surgery   The patient presents for follow up from Mohs surgery for a SCC on the right lower leg, treated on 10/28/23, repaired with second intention. The patient has been bandaging the wound as directed. The endorse the following concerns: No questions or concerns at this time.  The following portions of the chart were reviewed this encounter and updated as appropriate: medications, allergies, medical history  Review of Systems:  No other skin or systemic complaints except as noted in HPI or Assessment and Plan.  Objective  Well appearing patient in no apparent distress; mood and affect are within normal limits.  A full examination was performed including scalp, head, face and right lower leg. All findings within normal limits unless otherwise noted below.  Healing wound with mild erythema  Relevant physical exam findings are noted in the Assessment and Plan.   Assessment & Plan     Healing s/p Mohs for SCC, treated on right lower leg, healing by second intention. 1.2 x 1.0 cm - Reassured that wound is healing well - No evidence of infection - No swelling, induration, purulence, dehiscence, or tenderness out of proportion to the clinical exam, see photo above - Discussed that scars take up to 12 months to mature from the date of surgery - Recommend SPF 30+ to scar daily to prevent purple color from UV exposure during scar maturation process - Discussed that erythema and raised appearance of scar will fade over the next 4-6 months - OK to start scar massage at 4-6 weeks post-op - Can consider silicone based products for scar healing starting at 6 weeks post-op - Ok to continue ointment daily to wound under a bandage for another 2-4  weeks or until completely healed  Wound Bed Preparation The patient is being prepared for potential closure, including skin graft vs  continuing to encourage the secondary intention wound healing process . The wound bed is thoroughly assessed and prepared to optimize graft take and healing. The necrotic tissue is debrided to create a clean, viable base, and any infected tissue is removed to reduce the risk of graft failure. Hemostasis is achieved to prevent bleeding complications during the grafting process. The wound edges are carefully undermined, and the underlying tissues are inspected for adequate vascularity to support the graft. The wound is then thoroughly irrigated, and the area is prepped with antiseptic solutions to minimize the risk of infection. Once the wound bed is adequately prepared, it is covered with a moist dressing.  Return in about 4 weeks (around 12/20/2023) for Mohs f/u.  I, Berwyn Lesches, Surg Tech III, am acting as scribe for RUFUS CHRISTELLA HOLY, MD.   Documentation: I have reviewed the above documentation for accuracy and completeness, and I agree with the above.  RUFUS CHRISTELLA HOLY, MD

## 2023-11-22 NOTE — Patient Instructions (Signed)

## 2023-11-24 NOTE — Telephone Encounter (Signed)
 I don't remember wither.  Probably the lipakar wash and Mositurizer if I had to guess.

## 2023-11-30 DIAGNOSIS — F419 Anxiety disorder, unspecified: Secondary | ICD-10-CM | POA: Diagnosis not present

## 2023-11-30 DIAGNOSIS — K5792 Diverticulitis of intestine, part unspecified, without perforation or abscess without bleeding: Secondary | ICD-10-CM | POA: Diagnosis not present

## 2023-11-30 DIAGNOSIS — F4310 Post-traumatic stress disorder, unspecified: Secondary | ICD-10-CM | POA: Diagnosis not present

## 2023-11-30 DIAGNOSIS — F329 Major depressive disorder, single episode, unspecified: Secondary | ICD-10-CM | POA: Diagnosis not present

## 2023-12-01 ENCOUNTER — Other Ambulatory Visit (HOSPITAL_BASED_OUTPATIENT_CLINIC_OR_DEPARTMENT_OTHER): Payer: Self-pay

## 2023-12-01 ENCOUNTER — Other Ambulatory Visit: Payer: Self-pay

## 2023-12-01 ENCOUNTER — Encounter (HOSPITAL_BASED_OUTPATIENT_CLINIC_OR_DEPARTMENT_OTHER): Payer: Self-pay

## 2023-12-01 ENCOUNTER — Emergency Department (HOSPITAL_BASED_OUTPATIENT_CLINIC_OR_DEPARTMENT_OTHER)
Admission: EM | Admit: 2023-12-01 | Discharge: 2023-12-01 | Disposition: A | Discharge status: Home / Self Care | Source: Ambulatory Visit | Attending: Emergency Medicine | Admitting: Emergency Medicine

## 2023-12-01 ENCOUNTER — Emergency Department (HOSPITAL_BASED_OUTPATIENT_CLINIC_OR_DEPARTMENT_OTHER)

## 2023-12-01 DIAGNOSIS — R1032 Left lower quadrant pain: Secondary | ICD-10-CM | POA: Diagnosis not present

## 2023-12-01 DIAGNOSIS — K5730 Diverticulosis of large intestine without perforation or abscess without bleeding: Secondary | ICD-10-CM | POA: Diagnosis not present

## 2023-12-01 LAB — COMPREHENSIVE METABOLIC PANEL WITH GFR
ALT: 20 U/L (ref 0–44)
AST: 23 U/L (ref 15–41)
Albumin: 4.1 g/dL (ref 3.5–5.0)
Alkaline Phosphatase: 68 U/L (ref 38–126)
Anion gap: 10 (ref 5–15)
BUN: 9 mg/dL (ref 8–23)
CO2: 25 mmol/L (ref 22–32)
Calcium: 9.3 mg/dL (ref 8.9–10.3)
Chloride: 96 mmol/L — ABNORMAL LOW (ref 98–111)
Creatinine, Ser: 0.65 mg/dL (ref 0.44–1.00)
GFR, Estimated: >60 mL/min (ref >60)
Glucose, Bld: 92 mg/dL (ref 70–99)
Potassium: 4 mmol/L (ref 3.5–5.1)
Sodium: 131 mmol/L — ABNORMAL LOW (ref 135–145)
Total Bilirubin: 0.4 mg/dL (ref 0.0–1.2)
Total Protein: 6.8 g/dL (ref 6.5–8.1)

## 2023-12-01 LAB — CBC WITH DIFFERENTIAL/PLATELET
Abs Immature Granulocytes: 0.02 K/uL (ref 0.00–0.07)
Basophils Absolute: 0 K/uL (ref 0.0–0.1)
Basophils Relative: 0 %
Eosinophils Absolute: 0.2 K/uL (ref 0.0–0.5)
Eosinophils Relative: 4 %
HCT: 34.8 % — ABNORMAL LOW (ref 36.0–46.0)
Hemoglobin: 11.8 g/dL — ABNORMAL LOW (ref 12.0–15.0)
Immature Granulocytes: 0 %
Lymphocytes Relative: 22 %
Lymphs Abs: 1 K/uL (ref 0.7–4.0)
MCH: 30.2 pg (ref 26.0–34.0)
MCHC: 33.9 g/dL (ref 30.0–36.0)
MCV: 89 fL (ref 80.0–100.0)
Monocytes Absolute: 0.5 K/uL (ref 0.1–1.0)
Monocytes Relative: 11 %
Neutro Abs: 2.8 K/uL (ref 1.7–7.7)
Neutrophils Relative %: 63 %
Platelets: 239 K/uL (ref 150–400)
RBC: 3.91 MIL/uL (ref 3.87–5.11)
RDW: 12.8 % (ref 11.5–15.5)
WBC: 4.5 K/uL (ref 4.0–10.5)
nRBC: 0 % (ref 0.0–0.2)

## 2023-12-01 LAB — URINALYSIS, ROUTINE W REFLEX MICROSCOPIC
Bilirubin Urine: NEGATIVE
Glucose, UA: NEGATIVE mg/dL
Hgb urine dipstick: NEGATIVE
Ketones, ur: NEGATIVE mg/dL
Leukocytes,Ua: NEGATIVE
Nitrite: NEGATIVE
Protein, ur: NEGATIVE mg/dL
Specific Gravity, Urine: 1.005 (ref 1.005–1.030)
pH: 8 (ref 5.0–8.0)

## 2023-12-01 LAB — LIPASE, BLOOD: Lipase: 30 U/L (ref 11–51)

## 2023-12-01 MED ORDER — SODIUM CHLORIDE 0.9 % IV BOLUS
1000.0000 mL | Freq: Once | INTRAVENOUS | Status: AC
  Administered 2023-12-01: 1000 mL via INTRAVENOUS

## 2023-12-01 MED ORDER — ONDANSETRON HCL 4 MG/2ML IJ SOLN
4.0000 mg | Freq: Once | INTRAMUSCULAR | Status: AC
  Administered 2023-12-01: 4 mg via INTRAVENOUS
  Filled 2023-12-01: qty 2

## 2023-12-01 MED ORDER — MORPHINE SULFATE (PF) 4 MG/ML IV SOLN: 4.0000 mg | Freq: Once | INTRAVENOUS | Status: DC

## 2023-12-01 MED ORDER — AMOXICILLIN-POT CLAVULANATE 875-125 MG PO TABS
1.0000 | ORAL_TABLET | Freq: Two times a day (BID) | ORAL | 0 refills | Status: DC
  Filled 2023-12-01: qty 14, 7d supply, fill #0

## 2023-12-01 MED ORDER — IOHEXOL 300 MG/ML  SOLN
100.0000 mL | Freq: Once | INTRAMUSCULAR | Status: AC | PRN
  Administered 2023-12-01: 100 mL via INTRAVENOUS

## 2023-12-01 NOTE — Discharge Instructions (Addendum)
 There is no obvious cause for your symptoms.  Please follow-up with your family doctor in the office.  Please return for sudden worsening pain fever inability eat or drink.

## 2023-12-01 NOTE — ED Notes (Signed)
Reviewed discharge instructions, medications, and home care with pt. Pt verbalized understanding and had no further questions. Pt exited ED without complications.

## 2023-12-01 NOTE — ED Notes (Signed)
 Pt ambulatory self to bathroom

## 2023-12-01 NOTE — ED Triage Notes (Addendum)
 Arrives POV with complaints of left side abdomen pain x1 week. Patient increases when she eats. She saw her PCP yesterday and they were concerned she had diverticulitis.   No vomiting/diarrhea or fevers. Rates pain a 5/10.

## 2023-12-01 NOTE — ED Provider Notes (Signed)
 [place] EMERGENCY DEPARTMENT AT [facility] Provider Note   CSN: [id number] Arrival date & time: 12/01/23  1122     Patient presents with: Abdominal Pain   Donna [name] is a 73 y.o. female.   73 yo F with a chief complaints of left lower quadrant abdominal discomfort.  Going on for about a week.  Not improving.  Saw her family doctor yesterday and they were concerned for the possibility of diverticulitis.  She was started on Flagyl and encouraged to come to the emergency department setting for CT imaging.  She denies any urinary symptoms denies fevers denies nausea or vomiting.  She has a history of diverticulosis but has never had diverticulitis.   Abdominal Pain      Prior to Admission medications   Medication Sig Start Date End Date Taking? Authorizing Provider  amoxicillin -clavulanate (AUGMENTIN ) 875-125 MG tablet Take 1 tablet by mouth every 12 (twelve) hours. 12/01/23  Yes Kilo Eshelman, DO  metroNIDAZOLE (FLAGYL) 500 MG tablet Take 500 mg by mouth 3 (three) times daily. 11/30/23  Yes [provider]  albuterol (VENTOLIN HFA) 108 (90 Base) MCG/ACT inhaler Inhale 2 puffs into the lungs every 4 (four) hours as needed. 08/04/21   [provider]  b complex vitamins tablet Take 1 tablet by mouth daily.      [provider]  budesonide -formoterol  (SYMBICORT ) 80-4.5 MCG/ACT inhaler Inhale 2 puffs into the lungs every 6 (six) hours as needed. 09/22/23   Hope Almarie ORN, NP  busPIRone (BUSPAR) 5 MG tablet Take 5 mg by mouth daily.    [provider]  calcium  gluconate 500 MG tablet Take 500 mg by mouth daily.    [provider]  Efinaconazole  (JUBLIA ) 10 % SOLN Apply 1 application  topically at bedtime. 08/12/23   Alm Delon SAILOR, DO  Estradiol  (YUVAFEM ) 10 MCG TABS vaginal tablet Place 1 tablet vaginally once a week. On Sundays    [provider]  lisinopril  (ZESTRIL ) 5 MG tablet TAKE 1 TABLET BY MOUTH DAILY FOR  ELEVATED BP WHILE TRAVELING 11/05/19   Perri Ronal PARAS, MD  Magnesium 500 MG TABS Take 1 tablet by mouth daily.      [provider]  Multiple Vitamin (MULTIVITAMIN) capsule Take 1 capsule by mouth daily.      [provider]  mupirocin  ointment (BACTROBAN ) 2 % Apply 1 Application topically 2 (two) times daily. 10/28/23   Paci, Karina M, MD  oxyCODONE  (OXY IR/ROXICODONE ) 5 MG immediate release tablet Take 1 tablet (5 mg total) by mouth every 6 (six) hours as needed for up to 8 doses. 10/28/23   Paci, Karina M, MD  propranolol  (INDERAL ) 10 MG tablet Take 10 mg by mouth as needed. 12/02/20   [provider]  triamcinolone  ointment (KENALOG ) 0.1 % Apply 1 Application topically daily as needed. 11/03/23   Alm Delon SAILOR, DO  umeclidinium bromide  (INCRUSE ELLIPTA ) 62.5 MCG/ACT AEPB Inhale 1 puff into the lungs daily. 09/22/23   Hope Almarie ORN, NP  valACYclovir  (VALTREX ) 500 MG tablet ONE BY MOUTH TWICE A DAY X 5 DAYS FOR HERPES OUTBREAK 11/25/19   Perri Ronal PARAS, MD    Allergies: Ciprofloxacin, Corticosteroids, Doxycycline, Gluten meal, Incruse ellipta  [umeclidinium bromide ], Montelukast , Penicillins, Prednisone, Quinolones, and Statins    Review of Systems  Gastrointestinal:  Positive for abdominal pain.    Updated Vital Signs BP (!) 154/81   Pulse 67   Temp 98.4 F (36.9 C) (Oral)   Resp 20  Ht 5' 7 (1.702 m)   Wt 70.3 kg   SpO2 94%   BMI 24.28 kg/m   Physical Exam Vitals and nursing note reviewed.  Constitutional:      General: She is not in acute distress.    Appearance: She is well-developed. She is not diaphoretic.  HENT:     Head: Normocephalic and atraumatic.  Eyes:     Pupils: Pupils are equal, round, and reactive to light.  Cardiovascular:     Rate and Rhythm: Normal rate and regular rhythm.     Heart sounds: No murmur heard.    No friction rub. No gallop.  Pulmonary:     Effort: Pulmonary effort is normal.     Breath sounds: No wheezing or  rales.  Abdominal:     General: There is no distension.     Palpations: Abdomen is soft.     Tenderness: There is abdominal tenderness.     Comments: Very mild tenderness to LLQ, no rebound, no guarding  Musculoskeletal:        General: No tenderness.     Cervical back: Normal range of motion and neck supple.  Skin:    General: Skin is warm and dry.  Neurological:     Mental Status: She is alert and oriented to person, place, and time.  Psychiatric:        Behavior: Behavior normal.     (all labs ordered are listed, but only abnormal results are displayed) Labs Reviewed  CBC WITH DIFFERENTIAL/PLATELET - Abnormal; Notable for the following components:      Result Value   Hemoglobin 11.8 (*)    HCT 34.8 (*)    All other components within normal limits  COMPREHENSIVE METABOLIC PANEL WITH GFR - Abnormal; Notable for the following components:   Sodium 131 (*)    Chloride 96 (*)    All other components within normal limits  LIPASE, BLOOD  URINALYSIS, ROUTINE W REFLEX MICROSCOPIC    EKG: None  Radiology: CT ABDOMEN PELVIS W CONTRAST Result Date: 12/01/2023 CLINICAL DATA:  LLQ abdominal pain EXAM: CT ABDOMEN AND PELVIS WITH CONTRAST TECHNIQUE: Multidetector CT imaging of the abdomen and pelvis was performed using the standard protocol following bolus administration of intravenous contrast. RADIATION DOSE REDUCTION: This exam was performed according to the departmental dose-optimization program which includes automated exposure control, adjustment of the mA and/or kV according to patient size and/or use of iterative reconstruction technique. CONTRAST:  OMNIPAQUE  IOHEXOL  300 MG/ML  SOLN COMPARISON:  CT abdomen/pelvis dated 05/11/2023. FINDINGS: Lower chest: No acute abnormality. Hepatobiliary: No focal liver abnormality is seen. No gallstones, gallbladder wall thickening, or biliary dilatation. Pancreas: Unremarkable. No pancreatic ductal dilatation or surrounding inflammatory  changes. Spleen: Normal in size without focal abnormality. Adrenals/Urinary Tract: Adrenal glands are unremarkable. Kidneys are normal, without renal calculi, suspicious focal lesion, or hydronephrosis. Bladder is unremarkable. Stomach/Bowel: Stomach is within normal limits. Status post appendectomy. No evidence of obstruction or focal inflammatory changes. Scattered colonic diverticulosis without evidence of acute diverticulitis. Vascular/Lymphatic: Abdominal aorta is normal in caliber. Moderate atherosclerotic calcification of the abdominal aorta and its major branches. No enlarged abdominal or pelvic lymph nodes. Reproductive: Uterus and bilateral adnexa are unremarkable. Other: No abdominal wall hernia or abnormality. No abdominopelvic ascites. No intraperitoneal free air. Musculoskeletal: No acute or significant osseous findings. IMPRESSION: 1. No acute localizing findings in the abdomen or pelvis. 2. Scattered colonic diverticulosis without evidence of acute diverticulitis. 3.  Aortic Atherosclerosis (ICD10-I70.0). Electronically Signed  By: Harrietta Sherry M.D.   On: 12/01/2023 13:00     Procedures   Medications Ordered in the ED  morphine  (PF) 4 MG/ML injection 4 mg (4 mg Intravenous Patient Refused/Not Given 12/01/23 1151)  ondansetron  (ZOFRAN ) injection 4 mg (4 mg Intravenous Given 12/01/23 1158)  sodium chloride  0.9 % bolus 1,000 mL (1,000 mLs Intravenous New Bag/Given 12/01/23 1158)  iohexol  (OMNIPAQUE ) 300 MG/ML solution 100 mL (100 mLs Intravenous Contrast Given 12/01/23 1239)                                    Medical Decision Making Amount and/or Complexity of Data Reviewed Labs: ordered. Radiology: ordered.  Risk Prescription drug management.   73 yo F with a chief complaint of left lower quadrant abdominal discomfort.  Going on for about a week.  Saw her PCP yesterday who encouraged her to come to the ED setting for CT imaging.  Will obtain CT scan.  Treat pain and nausea.   Bolus of IV fluids.  Lab work urine reassess.  No leukocytosis, no acute anemia.  No significant electrolyte abnormalities.  UA negative for infection.  CT abdomen pelvis without obvious acute intra-abdominal pathology.  I discussed results with patient.  I discussed different possible ways to manage this.  She would prefer to be started on antibiotics.  I will start on Augmentin  today have her stop the Flagyl.    She did provide some further history after I discussed the results.  Told me that her pain initially started when she was stretching her back on top of the pillow under her low back.  Perhaps this is musculoskeletal.  Have her follow-up with her family doctor in the office.  1:11 PM:  I have discussed the diagnosis/risks/treatment options with the patient.  Evaluation and diagnostic testing in the emergency department does not suggest an emergent condition requiring admission or immediate intervention beyond what has been performed at this time.  They will follow up with PCP. We also discussed returning to the ED immediately if new or worsening sx occur. We discussed the sx which are most concerning (e.g., sudden worsening pain, fever, inability to tolerate by mouth) that necessitate immediate return. Medications administered to the patient during their visit and any new prescriptions provided to the patient are listed below.  Medications given during this visit Medications  morphine  (PF) 4 MG/ML injection 4 mg (4 mg Intravenous Patient Refused/Not Given 12/01/23 1151)  ondansetron  (ZOFRAN ) injection 4 mg (4 mg Intravenous Given 12/01/23 1158)  sodium chloride  0.9 % bolus 1,000 mL (1,000 mLs Intravenous New Bag/Given 12/01/23 1158)  iohexol  (OMNIPAQUE ) 300 MG/ML solution 100 mL (100 mLs Intravenous Contrast Given 12/01/23 1239)     The patient appears reasonably screen and/or stabilized for discharge and I doubt any other medical condition or other [facility] requiring further screening,  evaluation, or treatment in the ED at this time prior to discharge.       Final diagnoses:  LLQ abdominal pain    ED Discharge Orders          Ordered    amoxicillin -clavulanate (AUGMENTIN ) 875-125 MG tablet  Every 12 hours        12/01/23 1310               [place], DO 12/01/23 1311

## 2023-12-05 NOTE — Progress Notes (Unsigned)
 [name], PA-C [address] [place], [place]  [address] Phone: [phone]   Primary Care Physician: Gerome Brunet, DO  Primary Gastroenterologist:  [name], PA-C / Glendia Holt, MD   Chief Complaint: F/U LLQ abdominal pain       HPI:   Donna [name] is a 73 y.o. female, previous patient of Dr. Aneita, presents for evaluation of abdominal pain.  Patient went to [facility] 12/01/23 to evaluate 1 week history of LLQ abdominal pain.  Abdominal pelvic CT showed no acute abnormality to explain abdominal pain.  Incidental diverticulosis but no evidence of diverticulitis.  CBC showed mild anemia with Hgb 11.8, hematocrit 34.8, MCV 89.  Normal white count 4.5.  Normal CMP and lipase.  UA negative.  She was empirically started on Augmentin  875 Mg twice daily x 7 days.  No previous history of diverticulitis.  12/01/2023 CT abdomen pelvis with contrast:  1. No acute localizing findings in the abdomen or pelvis. 2. Scattered colonic diverticulosis without evidence of acute diverticulitis. 3.  Aortic Atherosclerosis  Current symptoms:  She has personal history of adenomatous colon polyps, GERD, fatty liver, alcohol use.  She has family history of colon cancer.  07/2021 last colonoscopy by Dr. Aneita: Mild pandiverticulosis.  No polyps.  Good prep.  5-year repeat due to previous history of colon polyps and family history of colon cancer.  (5-year repeat due 07/2026).  03/2020 last EGD (for dysphagia): Small hiatal hernia, otherwise normal.  Esophagus was normal with no evidence of esophageal stricture, however she underwent empiric esophageal dilation to 17 mm.  Also had colonoscopies in 2000, 2003, 2006, 2011, 2016, 2019.  Current Outpatient Medications  Medication Sig Dispense Refill   albuterol (VENTOLIN HFA) 108 (90 Base) MCG/ACT inhaler Inhale 2 puffs into the lungs every 4 (four) hours as needed.     amoxicillin -clavulanate (AUGMENTIN ) 875-125 MG tablet Take 1  tablet by mouth every 12 (twelve) hours. 14 tablet 0   b complex vitamins tablet Take 1 tablet by mouth daily.       budesonide -formoterol  (SYMBICORT ) 80-4.5 MCG/ACT inhaler Inhale 2 puffs into the lungs every 6 (six) hours as needed. 10.2 each 2   busPIRone (BUSPAR) 5 MG tablet Take 5 mg by mouth daily.     calcium  gluconate 500 MG tablet Take 500 mg by mouth daily.     Efinaconazole  (JUBLIA ) 10 % SOLN Apply 1 application  topically at bedtime. 8 mL 11   Estradiol  (YUVAFEM ) 10 MCG TABS vaginal tablet Place 1 tablet vaginally once a week. On Sundays     lisinopril  (ZESTRIL ) 5 MG tablet TAKE 1 TABLET BY MOUTH DAILY FOR ELEVATED BP WHILE TRAVELING 90 tablet 1   Magnesium 500 MG TABS Take 1 tablet by mouth daily.       metroNIDAZOLE (FLAGYL) 500 MG tablet Take 500 mg by mouth 3 (three) times daily.     Multiple Vitamin (MULTIVITAMIN) capsule Take 1 capsule by mouth daily.       mupirocin  ointment (BACTROBAN ) 2 % Apply 1 Application topically 2 (two) times daily. 30 g 5   oxyCODONE  (OXY IR/ROXICODONE ) 5 MG immediate release tablet Take 1 tablet (5 mg total) by mouth every 6 (six) hours as needed for up to 8 doses. 8 tablet 0   propranolol  (INDERAL ) 10 MG tablet Take 10 mg by mouth as needed.     triamcinolone  ointment (KENALOG ) 0.1 % Apply 1 Application topically daily as needed. 80 g 1  umeclidinium bromide  (INCRUSE ELLIPTA ) 62.5 MCG/ACT AEPB Inhale 1 puff into the lungs daily. 30 each 1   valACYclovir  (VALTREX ) 500 MG tablet ONE BY MOUTH TWICE A DAY X 5 DAYS FOR HERPES OUTBREAK 10 tablet prn   No current facility-administered medications for this visit.    Allergies as of 12/06/2023 - Review Complete 12/01/2023  Allergen Reaction Noted   Ciprofloxacin     Corticosteroids Other (See Comments) 03/29/2023   Doxycycline Nausea Only 03/01/2017   Gluten meal  12/07/2016   Incruse ellipta  [umeclidinium bromide ]  04/14/2022   Montelukast  Other (See Comments) 08/21/2023   Penicillins Nausea And  Vomiting 03/05/2010   Prednisone Other (See Comments) 03/29/2023   Quinolones Other (See Comments) 12/10/2008   Statins Other (See Comments) 08/21/2023    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    Physical Exam:  There were no vitals taken for this visit. No LMP recorded. Patient is postmenopausal.  General: Well-nourished, well-developed in no acute distress.  Lungs: Clear to auscultation bilaterally. Non-labored. Heart: Regular rate and rhythm, no murmurs rubs or gallops.  Abdomen: Bowel sounds are normal; Abdomen is Soft; No hepatosplenomegaly, masses or hernias;  No Abdominal Tenderness; No guarding or rebound tenderness. Neuro: Alert and oriented x 3.  Grossly intact.  Psych: Alert and cooperative, normal mood and affect.   Imaging Studies: CT ABDOMEN PELVIS W CONTRAST Result Date:  12/01/2023 CLINICAL DATA:  LLQ abdominal pain EXAM: CT ABDOMEN AND PELVIS WITH CONTRAST TECHNIQUE: Multidetector CT imaging of the abdomen and pelvis was performed using the standard protocol following bolus administration of intravenous contrast. RADIATION DOSE REDUCTION: This exam was performed according to the departmental dose-optimization program which includes automated exposure control, adjustment of the mA and/or kV according to patient size and/or use of iterative reconstruction technique. CONTRAST:  OMNIPAQUE  IOHEXOL  300 MG/ML  SOLN COMPARISON:  CT abdomen/pelvis dated 05/11/2023. FINDINGS: Lower chest: No acute abnormality. Hepatobiliary: No focal liver abnormality is seen. No gallstones, gallbladder wall thickening, or biliary dilatation. Pancreas: Unremarkable. No pancreatic ductal dilatation or surrounding inflammatory changes. Spleen: Normal in size without focal abnormality. Adrenals/Urinary Tract: Adrenal glands are unremarkable. Kidneys are normal, without renal  calculi, suspicious focal lesion, or hydronephrosis. Bladder is unremarkable. Stomach/Bowel: Stomach is within normal limits. Status post appendectomy. No evidence of obstruction or focal inflammatory changes. Scattered colonic diverticulosis without evidence of acute diverticulitis. Vascular/Lymphatic: Abdominal aorta is normal in caliber. Moderate atherosclerotic calcification of the abdominal aorta and its major branches. No enlarged abdominal or pelvic lymph nodes. Reproductive: Uterus and bilateral adnexa are unremarkable. Other: No abdominal wall hernia or abnormality. No abdominopelvic ascites. No intraperitoneal free air. Musculoskeletal: No acute or significant osseous findings. IMPRESSION: 1. No acute localizing findings in the abdomen or pelvis. 2. Scattered colonic diverticulosis without evidence of acute diverticulitis. 3.  Aortic Atherosclerosis (ICD10-I70.0). Electronically Signed   By: Harrietta Sherry M.D.   On:  12/01/2023 13:00    Labs: CBC    Component Value Date/Time   WBC 4.5 12/01/2023 1155   RBC 3.91 12/01/2023 1155   HGB 11.8 (L) 12/01/2023 1155   HCT 34.8 (L) 12/01/2023 1155   PLT 239 12/01/2023 1155   MCV 89.0 12/01/2023 1155   MCH 30.2 12/01/2023 1155   MCHC 33.9 12/01/2023 1155   RDW 12.8 12/01/2023 1155   LYMPHSABS 1.0 12/01/2023 1155   MONOABS 0.5 12/01/2023 1155   EOSABS 0.2 12/01/2023 1155   BASOSABS 0.0 12/01/2023 1155    CMP     Component Value Date/Time   NA 131 (L) 12/01/2023 1155   NA 137 07/19/2023 0810   K 4.0 12/01/2023 1155   CL 96 (L) 12/01/2023 1155   CO2 25 12/01/2023 1155   GLUCOSE 92 12/01/2023 1155   BUN 9 12/01/2023 1155   BUN 10 07/19/2023 0810   CREATININE 0.65 12/01/2023 1155   CREATININE 0.73 07/02/2020 0924   CALCIUM  9.3 12/01/2023 1155   PROT 6.8 12/01/2023 1155   PROT 6.4 07/19/2023 0810   ALBUMIN 4.1 12/01/2023 1155   ALBUMIN 4.1 07/19/2023 0810   AST 23 12/01/2023 1155   ALT 20 12/01/2023 1155   ALKPHOS 68 12/01/2023 1155   BILITOT 0.4 12/01/2023 1155   BILITOT 0.4 07/19/2023 0810   GFRNONAA >60 12/01/2023 1155   GFRNONAA 85 07/02/2020 0924   GFRAA 98 07/02/2020 0924       Assessment and Plan:   KALAYSIA DEMONBREUN is a 73 y.o. y/o female presents for follow-up of:  1.  LLQ abdominal pain - Finished empiric treatment with Augmentin  recently prescribed from the ED. - Reassurance regarding recent normal abdominal pelvic CT.  2.  Diverticulosis - Patient education discussed. - Start Benefiber  3.  History of adenomatous colon polyps - Last colonoscopy 07/2021 was negative. - 5-year repeat colonoscopy will be due 07/2026.  4.  Family history of colon cancer    [name], PA-C  Follow up ***

## 2023-12-06 ENCOUNTER — Encounter: Payer: Self-pay | Admitting: Physician Assistant

## 2023-12-06 ENCOUNTER — Ambulatory Visit (INDEPENDENT_AMBULATORY_CARE_PROVIDER_SITE_OTHER): Admitting: Physician Assistant

## 2023-12-06 VITALS — BP 118/70 | HR 63 | Ht 67.0 in | Wt 154.0 lb

## 2023-12-06 DIAGNOSIS — R1032 Left lower quadrant pain: Secondary | ICD-10-CM

## 2023-12-06 DIAGNOSIS — K589 Irritable bowel syndrome without diarrhea: Secondary | ICD-10-CM | POA: Diagnosis not present

## 2023-12-06 DIAGNOSIS — S39011A Strain of muscle, fascia and tendon of abdomen, initial encounter: Secondary | ICD-10-CM | POA: Diagnosis not present

## 2023-12-06 NOTE — Patient Instructions (Signed)
 We have given you samples of the following medication to take: Ibgard take 2 capsules twice daily for one week  Please follow up sooner if symptoms increase or worsen  Due to recent changes in healthcare laws, you may see the results of your imaging and laboratory studies on MyChart before your provider has had a chance to review them.  We understand that in some cases there may be results that are confusing or concerning to you. Not all laboratory results come back in the same time frame and the provider may be waiting for multiple results in order to interpret others.  Please give us  48 hours in order for your provider to thoroughly review all the results before contacting the office for clarification of your results.   Thank you for trusting me with your gastrointestinal care!   [name], PA-C _______________________________________________________  If your blood pressure at your visit was 140/90 or greater, please contact your primary care physician to follow up on this.  _______________________________________________________  If you are age 17 or older, your body mass index should be between 23-30. Your Body mass index is 24.12 kg/m. If this is out of the aforementioned range listed, please consider follow up with your Primary Care Provider.  If you are age 59 or younger, your body mass index should be between 19-25. Your Body mass index is 24.12 kg/m. If this is out of the aformentioned range listed, please consider follow up with your Primary Care Provider.   ________________________________________________________  The [facility] providers would like to encourage you to use MYCHART to communicate with providers for non-urgent requests or questions.  Due to long hold times on the telephone, sending your provider a message by [facility] may be a faster and more efficient way to get a response.  Please allow 48 business hours for a response.  Please remember that this is for non-urgent  requests.  _______________________________________________________

## 2023-12-16 ENCOUNTER — Encounter: Payer: Self-pay | Admitting: Sports Medicine

## 2023-12-16 ENCOUNTER — Ambulatory Visit: Admitting: Sports Medicine

## 2023-12-16 DIAGNOSIS — M21622 Bunionette of left foot: Secondary | ICD-10-CM | POA: Diagnosis not present

## 2023-12-16 DIAGNOSIS — S39011D Strain of muscle, fascia and tendon of abdomen, subsequent encounter: Secondary | ICD-10-CM

## 2023-12-16 DIAGNOSIS — M216X2 Other acquired deformities of left foot: Secondary | ICD-10-CM

## 2023-12-16 NOTE — Progress Notes (Unsigned)
 Patient says that she has been having left sided abdominal pain. She says that she was given antibiotics for diverticulitis, but then told that her blood work and CT scan did not indicate diverticulitis. She is here today with concern for abdominal tear or hernia. She says that when she stretches back onto a bolster or lifts an object, she feels pain. She does not feel discomfort today, although she has not tried stretching through the abdominals before coming in for her appointment. Patient points over obliques when describing her pain. She has not taken any other medication for the abdominal pain.

## 2023-12-16 NOTE — Progress Notes (Signed)
 Donna [name] - 73 y.o. female MRN [id number]  Date of birth: Mar 04, 1951  Office Visit Note: Visit Date: 12/16/2023 PCP: Gerome Brunet, DO Referred by: Gerome Brunet, DO  Subjective: Chief Complaint  Patient presents with   Left Foot - Pain   HPI: Donna [name] is a pleasant 73 y.o. female who presents today for evaluation of left-sided abdominal/flank pain as well as left lateral foot pain.  Patient says she first began having left flank pain roughly 2 weeks ago after a core workout.  Initially saw her PCP who believed she might have diverticulitis due to left lower quadrant pain.  Sent her for abdominal CT which showed diverticulosis but no evidence of diverticulitis.  CT also showed no evidence of abdominal hernias present.  Was referred to GI for follow-up.  GI believed muscle strain.  Referred her to us  for further management care.  Patient says that over the past week she has noticed significant improvement in abdominal pain.  Can now perform all ADLs pain-free.  On a separate note, she says she has been having fifth MTP joint over the last couple months.  Denies any acute injury.  Now having painful ambulation on both plantar surface and lateral edge of fifth MTP joint.  Previously wore orthotics which helps but no longer has them.  Pertinent ROS were reviewed with the patient and found to be negative unless otherwise specified above in HPI.   Assessment & Plan: Visit Diagnoses:  1. Abdominal muscle strain, subsequent encounter   2. Bunionette of left foot   3. Loss of transverse plantar arch of left foot     Plan: Agree with previous diagnosis from GI of left-sided abdominal muscle strain.  On further evaluation she likely experienced a strain of her left external oblique during twisting mechanics in a yoga workout.  She is progressing with her healing; no longer having functional pain.  Can restart isometric strengthening for her abdominal musculature but recommend waiting an  additional 2-4 weeks prior to resuming twisting mechanics. On evaluation of patient's left foot she has presence of bunionette as well as loss of her transverse arch.  Fitted patient for orthotic with metatarsal pad at today's office visit.  She will follow back up in 1 month to assess progress.  Also recommend paring down callus over her fifth metatarsal head for additional relief.  Patient agrees with treatment plan and has no further questions or concerns at this time.  Follow-up: No follow-ups on file.   Meds & Orders: No orders of the defined types were placed in this encounter.  No orders of the defined types were placed in this encounter.    Procedures: No procedures performed      Clinical History: No specialty comments available.  She reports that she quit smoking about 27 years ago. Her smoking use included cigarettes. She started smoking about 57 years ago. She has never used smokeless tobacco. No results for input(s): HGBA1C, LABURIC in the last 8760 hours.  Objective:   Vital Signs: There were no vitals taken for this visit.  Physical Exam  Gen: Well-appearing, in no acute distress; non-toxic CV: Well-perfused. Warm.  Resp: Breathing unlabored on room air; no wheezing. Psych: Fluid speech in conversation; appropriate affect; normal thought process  Ortho Exam Abdominal exam: - Patient has no skin changes present over abdomen at site of previous injury.  Previously describes pain is coming at border between rectus abdominis and external oblique musculature.  No tenderness with deep  palpation.  Patient has full active and passive range of motion including extension, flexion, and rotation without pain. Left foot exam: Patient has pes cavus foot morphology.  No evidence of injury on exam.  She does have excessive callus formation at her fifth metatarsal head along with presence of a bunionette.  She has loss of her transverse arch on her foot.  On assessment of ambulation  patient does go into excessive supination with plantarflexion placing additional pressure over her fifth metatarsal, likely reason for additional callus formation.  Imaging: No results found.  Past Medical/Family/Surgical/Social History: Medications & Allergies reviewed per EMR, new medications updated. Patient Active Problem List   Diagnosis Date Noted   Exposure to hepatitis B 07/01/2023   Need for immunization against viral hepatitis 07/01/2023   Screening for STDs (sexually transmitted diseases) 07/01/2023   History of substance use 07/01/2023   S/P appendectomy 05/11/2023   S/P laparoscopic appendectomy 05/11/2023   Gasping for breath 08/19/2021   PTSD (post-traumatic stress disorder) 08/19/2021   Parasomnia overlap disorder 08/19/2021   Snoring 08/19/2021   Excessive daytime sleepiness 08/19/2021   Sleep paralysis, recurrent isolated 08/19/2021   Asthma 02/19/2021   Multiple drug allergies 02/19/2021   Gluten intolerance 02/19/2021   Chronic rhinitis 02/19/2021   Metatarsalgia of left foot 11/14/2020   Achilles tendinosis of right lower extremity 11/14/2020   Essential tremor 07/04/2019   DOE (dyspnea on exertion) 01/20/2019   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 06/13/2018   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   Superficial basal cell carcinoma 06/13/2018   Upper airway cough syndrome 06/13/2018   Atrophic vaginitis 02/17/2018   Bilateral leg cramps 02/17/2018   Fatty liver, alcoholic 04/22/2016   Depression 04/19/2016   Withdrawal complaint 04/19/2016   Hx of adenomatous colonic polyps 08/03/2015   Herpes simplex 06/15/2014   GERD (gastroesophageal reflux disease) 10/14/2013   Anxiety state 10/14/2013   Recovering alcoholic in remission (HCC) 10/14/2013   Allergic rhinitis 10/14/2013   Adjustment disorder with anxiety 10/14/2013   Elevated low-density lipoprotein level 10/14/2013   Essential hypertension 10/15/2012    Heart murmur 05/25/2012   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   chest - CX3 + 5FU   Squamous cell carcinoma of skin 09/27/2014   left thigh - tx p bx   Substance abuse (HCC)    Family History  Problem Relation Age of Onset   Dementia Mother    Diabetes Mother    Melanoma Father    Colon cancer Father 71       Survived   Heart disease Father    Heart attack Father    Rheum arthritis Father    Dementia Father    Healthy Sister    Colon cancer Brother    Breast cancer Maternal  Grandmother    Colon cancer Paternal Grandmother    Dementia Maternal Aunt    Dementia Maternal Uncle    Colon polyps Paternal Aunt    Pancreatic cancer Paternal Aunt    Rectal cancer Paternal Aunt    Colon cancer Paternal Aunt    Stomach cancer Neg Hx    Esophageal cancer Neg Hx    Liver cancer Neg Hx    Past Surgical History:  Procedure Laterality Date   ANKLE SURGERY     Bilateral    CATARACT EXTRACTION Bilateral    CERVICAL SPINE SURGERY  07/21/2021   LAPAROSCOPIC APPENDECTOMY N/A 05/11/2023   Procedure: APPENDECTOMY LAPAROSCOPIC;  Surgeon: Sebastian Moles, MD;  Location: [facility];  Service: General;  Laterality: N/A;   TUBAL LIGATION     Social History   Occupational History   Occupation: [employer]  Tobacco Use   Smoking status: Former    Current packs/day: 0.00    Types: Cigarettes    Start date: 10/16/1966    Quit date: 10/15/1996    Years  since quitting: 27.1   Smokeless tobacco: Never  Vaping Use   Vaping status: Never Used  Substance and Sexual Activity   Alcohol use: No   Drug use: No   Sexual activity: Yes

## 2023-12-17 ENCOUNTER — Encounter: Payer: Self-pay | Admitting: Sports Medicine

## 2023-12-23 ENCOUNTER — Ambulatory Visit: Admitting: Dermatology

## 2023-12-27 ENCOUNTER — Ambulatory Visit (HOSPITAL_BASED_OUTPATIENT_CLINIC_OR_DEPARTMENT_OTHER): Admitting: Pulmonary Disease

## 2023-12-27 DIAGNOSIS — I25118 Atherosclerotic heart disease of native coronary artery with other forms of angina pectoris: Secondary | ICD-10-CM | POA: Diagnosis not present

## 2023-12-27 DIAGNOSIS — I77819 Aortic ectasia, unspecified site: Secondary | ICD-10-CM | POA: Diagnosis not present

## 2023-12-27 DIAGNOSIS — E785 Hyperlipidemia, unspecified: Secondary | ICD-10-CM | POA: Diagnosis not present

## 2023-12-28 ENCOUNTER — Encounter: Payer: Self-pay | Admitting: Dermatology

## 2023-12-28 ENCOUNTER — Ambulatory Visit: Admitting: Dermatology

## 2023-12-28 ENCOUNTER — Ambulatory Visit: Payer: Self-pay

## 2023-12-28 DIAGNOSIS — S81801D Unspecified open wound, right lower leg, subsequent encounter: Secondary | ICD-10-CM

## 2023-12-28 DIAGNOSIS — Z85828 Personal history of other malignant neoplasm of skin: Secondary | ICD-10-CM

## 2023-12-28 DIAGNOSIS — L820 Inflamed seborrheic keratosis: Secondary | ICD-10-CM | POA: Diagnosis not present

## 2023-12-28 DIAGNOSIS — E785 Hyperlipidemia, unspecified: Secondary | ICD-10-CM

## 2023-12-28 DIAGNOSIS — I25118 Atherosclerotic heart disease of native coronary artery with other forms of angina pectoris: Secondary | ICD-10-CM

## 2023-12-28 LAB — LIPOPROTEIN A (LPA): Lipoprotein (a): 18.7 nmol/L (ref <75.0)

## 2023-12-28 LAB — COMPREHENSIVE METABOLIC PANEL WITH GFR
ALT: 17 IU/L (ref 0–32)
AST: 26 IU/L (ref 0–40)
Albumin: 4.3 g/dL (ref 3.8–4.8)
Alkaline Phosphatase: 74 IU/L (ref 44–121)
BUN/Creatinine Ratio: 15 (ref 12–28)
BUN: 11 mg/dL (ref 8–27)
Bilirubin Total: 0.4 mg/dL (ref 0.0–1.2)
CO2: 22 mmol/L (ref 20–29)
Calcium: 9.2 mg/dL (ref 8.7–10.3)
Chloride: 98 mmol/L (ref 96–106)
Creatinine, Ser: 0.72 mg/dL (ref 0.57–1.00)
Globulin, Total: 2.3 g/dL (ref 1.5–4.5)
Glucose: 97 mg/dL (ref 70–99)
Potassium: 4.3 mmol/L (ref 3.5–5.2)
Sodium: 135 mmol/L (ref 134–144)
Total Protein: 6.6 g/dL (ref 6.0–8.5)
eGFR: 89 mL/min/1.73 (ref >59)

## 2023-12-28 NOTE — Patient Instructions (Addendum)

## 2023-12-28 NOTE — Progress Notes (Signed)
   Follow Up Visit   Subjective  Donna [name] is a 73 y.o. female who presents for the following: follow up from Mohs surgery   The patient presents for follow up from Mohs surgery for a SCC on the right lower leg, treated on 10/28/23, repaired with 2nd intention. The patient has been bandaging the wound as directed. The endorse the following concerns: none  The following portions of the chart were reviewed this encounter and updated as appropriate: medications, allergies, medical history  Review of Systems:  No other skin or systemic complaints except as noted in HPI or Assessment and Plan.  Objective  Well appearing patient in no apparent distress; mood and affect are within normal limits.  A focal examination was performed including scalp, head, face and right lower leg All findings within normal limits unless otherwise noted below.  Healing wound with mild erythema  Relevant physical exam findings are noted in the Assessment and Plan.  Left Upper Arm - Posterior Inflamed stuck on papule  Assessment & Plan   Healing Wound s/p Mohs for SCC, treated on 10/28/23, repaired with 2nd intention - Reassured that wound is healing well - No evidence of infection - No swelling, induration, purulence, dehiscence, or tenderness out of proportion to the clinical exam, see photo above - Discussed that scars take up to 12 months to mature from the date of surgery - Recommend SPF 30+ to scar daily to prevent purple color from UV exposure during scar maturation process - Discussed that erythema and raised appearance of scar will fade over the next 4-6 months - OK to start scar massage at 4-6 weeks post-op - Can consider silicone based products for scar healing starting at 6 weeks post-op  HISTORY OF SQUAMOUS CELL CARCINOMA OF THE SKIN - No evidence of recurrence today - Recommend regular full body skin exams - Recommend daily broad spectrum sunscreen SPF 30+ to sun-exposed areas, reapply  every 2 hours as needed.  - Call if any new or changing lesions are noted between office visits INFLAMED SEBORRHEIC KERATOSIS Left Upper Arm - Posterior Destruction of lesion - Left Upper Arm - Posterior Complexity: simple   Destruction method: cryotherapy   Informed consent: discussed and consent obtained   Outcome: patient tolerated procedure well with no complications   Post-procedure details: wound care instructions given     Return if symptoms worsen or fail to improve.  I, Darice Smock, CMA, am acting as scribe for RUFUS CHRISTELLA HOLY, MD.   Documentation: I have reviewed the above documentation for accuracy and completeness, and I agree with the above.  RUFUS CHRISTELLA HOLY, MD

## 2023-12-29 NOTE — Telephone Encounter (Signed)
 Order for NMR Lipoprofile placed in the system and released accordingly.  Pt will go tomorrow to get this drawn.  She is aware to go fasting to this lab appt, via mychart message.

## 2023-12-30 ENCOUNTER — Encounter: Payer: Self-pay | Admitting: Sports Medicine

## 2023-12-30 ENCOUNTER — Ambulatory Visit: Payer: Medicare HMO | Admitting: Dermatology

## 2023-12-31 LAB — NMR, LIPOPROFILE
Cholesterol, Total: 228 mg/dL — ABNORMAL HIGH (ref 100–199)
HDL Particle Number: 37.2 umol/L (ref >=30.5)
HDL-C: 82 mg/dL (ref >39)
LDL Particle Number: 1173 nmol/L — ABNORMAL HIGH (ref <1000)
LDL Size: 21.9 nm (ref >20.5)
LDL-C (NIH Calc): 135 mg/dL — ABNORMAL HIGH (ref 0–99)
LP-IR Score: <25 (ref <=45)
Small LDL Particle Number: <90 nmol/L (ref <=527)
Triglycerides: 63 mg/dL (ref 0–149)

## 2024-01-06 ENCOUNTER — Telehealth: Payer: Self-pay | Admitting: Physician Assistant

## 2024-01-06 ENCOUNTER — Other Ambulatory Visit: Payer: Self-pay | Admitting: Sports Medicine

## 2024-01-06 MED ORDER — DICYCLOMINE HCL 10 MG PO CAPS
10.0000 mg | ORAL_CAPSULE | Freq: Two times a day (BID) | ORAL | 1 refills | Status: DC | PRN
Start: 2024-01-06 — End: 2024-01-20

## 2024-01-06 MED ORDER — MELOXICAM 15 MG PO TABS: 15.0000 mg | ORAL_TABLET | Freq: Every day | ORAL | 0 refills | Status: DC

## 2024-01-06 NOTE — Telephone Encounter (Signed)
 Ellouise- I get a note regarding hyoscyamine possibly being contraindicated because of patient's listed allergy  to Incruse Ellipta  (causes urinary retention). For some reason, dicyclomine  does not give me this warning.  Ok to go ahead and rx hyoscyamine as previously directed or do you have another recommendation

## 2024-01-06 NOTE — Telephone Encounter (Signed)
 Left message for patient to call back

## 2024-01-06 NOTE — Telephone Encounter (Signed)
 Patient calls stating that she was seen in July by T.Garrett, PA-C for left sided abdominal pain originally dx by PCP as diverticulitis but later found to be related to pulled muscle. She went to see her orthopedist, Dr Burnetta and that pain has since improved.  She states, however, that she is now having intense abdominal pain after eating with pain ranging from sharp to crampy. States the pain moves around in the abdomen and is not in one particular area. No nausea/vomiting noted. +bloating but only occasional increased gas.  She is also concerned that her stools have recently become pencil thin but later says they can be larger than that at times. Denies any melena or hematochezia. Says she does often feel that she is not emptying all the way.  She has been taking IBGard and thinks this may be minimally effective.   Patient is advised to add benefiber to her regimen, starting at 1 teaspoon daily and working up to 1 tablespoon daily. This will keep stools soft but increase bulk. She is also asked to drink at least 64 oz water or more daily.  Ellouise, please advise... Would an antispasmotic or something of that sort be appropriate for her?

## 2024-01-06 NOTE — Telephone Encounter (Signed)
 Inbound call from patient requesting a call to discuss abdominal pain and change in bowel habits that she is still having. Patient states she is still having a lot of abdominal pain after eating. Patient is scheduled for 10/29. Requesting a call to discuss options to help in the meantime. Please advise, thank you

## 2024-01-06 NOTE — Telephone Encounter (Signed)
 Dicyclomine  sent to pharmacy.  Left message for patient to call back.

## 2024-01-07 NOTE — Telephone Encounter (Signed)
 Left message advising patient that we have sent dicyclomine  to her pharmacy for her abdominal discomfort/spasms/cramping. Advised caution with taking this medication as it can cause dizziness, drowsiness and increased fall risk in some individuals. She is asked to call back with any additional questions or concerns.

## 2024-01-11 ENCOUNTER — Telehealth: Payer: Self-pay | Admitting: Physician Assistant

## 2024-01-11 ENCOUNTER — Inpatient Hospital Stay
Admission: RE | Admit: 2024-01-11 | Discharge: 2024-01-11 | Disposition: A | Discharge status: Home / Self Care | Payer: Self-pay | Source: Ambulatory Visit | Attending: Sports Medicine | Admitting: Sports Medicine

## 2024-01-11 ENCOUNTER — Ambulatory Visit: Admitting: Sports Medicine

## 2024-01-11 ENCOUNTER — Encounter: Payer: Self-pay | Admitting: Sports Medicine

## 2024-01-11 DIAGNOSIS — R1032 Left lower quadrant pain: Secondary | ICD-10-CM

## 2024-01-11 DIAGNOSIS — S39011D Strain of muscle, fascia and tendon of abdomen, subsequent encounter: Secondary | ICD-10-CM

## 2024-01-11 NOTE — Progress Notes (Signed)
 ETTAMAE BARKETT - 73 y.o. female MRN [id number]  Date of birth: Jan 25, 1951  Office Visit Note: Visit Date: 01/11/2024 PCP: Gerome Brunet, DO Referred by: Gerome Brunet, DO  Subjective: No chief complaint on file.  HPI: MONCHEL POLLITT is a pleasant 73 y.o. female who presents today for follow-up of left-sided abdominal pain/strain.  I saw her about 3-4 weeks ago and she was having findings consistent with improving abdominal strain on the left side.  Unfortunately, she feels her pain has started to worsen again and is no longer improving.  This continues on the left side and at times will radiate from underneath the rib cage down to the lower abdomen.  No pain in the groin.  She did just start meloxicam  15 mg once daily a few days ago but has not noticed a big difference.  She continues with gentle yoga and isometric exercises although was pulled back from activity.  She has pain with activating the abdominal muscles as well as bending forward.  No changes to bowel or bladder.  Note reviewed from gastroenterology, [place] on 12/06/2023.  Evidence of diverticulosis/IBS.  Exam and findings most consistent with pulled muscle, no acute findings on abdominal pelvic CT.  Labs reviewed 12/01/23: Lab Results  Component Value Date   WBC 4.5 12/01/2023   HGB 11.8 (L) 12/01/2023   HCT 34.8 (L) 12/01/2023   MCV 89.0 12/01/2023   PLT 239 12/01/2023   Lab Results  Component Value Date   NA 135 12/27/2023   K 4.3 12/27/2023   CO2 22 12/27/2023   GLUCOSE 97 12/27/2023   BUN 11 12/27/2023   CREATININE 0.72 12/27/2023   CALCIUM  9.2 12/27/2023   GFR 97.04 08/05/2017   EGFR 89 12/27/2023   GFRNONAA >60 12/01/2023   Pertinent ROS were reviewed with the patient and found to be negative unless otherwise specified above in HPI.   Assessment & Plan: Visit Diagnoses:  1. Abdominal muscle strain, subsequent encounter   2. LLQ abdominal pain    Plan: Impression is persistent left-sided abdominal  pain that seems most consistent with muscular strain, given the pain with activation of the abdominal muscle/external oblique.  She has had significant workup including CT abdomen and pelvis which showed no acute findings.  She has no changes in bowel or bladder habits.  She has not found much relief from daily meloxicam  15 mg but has only been taking this a few doses.  She has been doing physical therapy, her home rehab with isometric exercises and gentle stretching with yoga but her pain still persist.  Given the chronicity of her discomfort since July and failure of improvement with PT and even worsening findings, I would like to order an MRI of the soft tissue of the abdomen for evaluate for strain/partial tear or any soft tissue hernia findings to help guide her treatment and management.  She will continue meloxicam  15 mg once daily for the first 2 weeks in the interim, also recommended compression undershirt to help with support during activity.  She will follow-up 1 week after MRI to review and discuss next steps.  Follow-up: Return for f/u 1 week after MRI.   Meds & Orders: No orders of the defined types were placed in this encounter.   Orders Placed This Encounter  Procedures   MR OUTSIDE FILMS BODY/ABD/PELVIS     Procedures: No procedures performed      Clinical History: No specialty comments available.  She reports that she quit smoking about  27 years ago. Her smoking use included cigarettes. She started smoking about 57 years ago. She has never used smokeless tobacco. No results for input(s): HGBA1C, LABURIC in the last 8760 hours.  Objective:    Physical Exam  Gen: Well-appearing, in no acute distress; non-toxic CV: Well-perfused. Warm.  Resp: Breathing unlabored on room air; no wheezing. Psych: Fluid speech in conversation; appropriate affect; normal thought process  Ortho Exam - Abdomen/Pelvis: 2 well-healed portal incisions from previous appendectomy.  There is  tenderness surrounding the left lower abdomen incision.  Pain with deep palpation and with activation of the abdominal muscle such as sit up and resisted straight leg raise.   Imaging:  *I did review the CT abdomen pelvis today during the visit.  CT ABDOMEN PELVIS W CONTRAST CLINICAL DATA:  LLQ abdominal pain   EXAM: CT ABDOMEN AND PELVIS WITH CONTRAST   TECHNIQUE: Multidetector CT imaging of the abdomen and pelvis was performed using the standard protocol following bolus administration of intravenous contrast.   RADIATION DOSE REDUCTION: This exam was performed according to the departmental dose-optimization program which includes automated exposure control, adjustment of the mA and/or kV according to patient size and/or use of iterative reconstruction technique.   CONTRAST:  OMNIPAQUE  IOHEXOL  300 MG/ML  SOLN   COMPARISON:  CT abdomen/pelvis dated 05/11/2023.   FINDINGS: Lower chest: No acute abnormality.   Hepatobiliary: No focal liver abnormality is seen. No gallstones, gallbladder wall thickening, or biliary dilatation.   Pancreas: Unremarkable. No pancreatic ductal dilatation or surrounding inflammatory changes.   Spleen: Normal in size without focal abnormality.   Adrenals/Urinary Tract: Adrenal glands are unremarkable. Kidneys are normal, without renal calculi, suspicious focal lesion, or hydronephrosis. Bladder is unremarkable.   Stomach/Bowel: Stomach is within normal limits. Status post appendectomy. No evidence of obstruction or focal inflammatory changes. Scattered colonic diverticulosis without evidence of acute diverticulitis.   Vascular/Lymphatic: Abdominal aorta is normal in caliber. Moderate atherosclerotic calcification of the abdominal aorta and its major branches. No enlarged abdominal or pelvic lymph nodes.   Reproductive: Uterus and bilateral adnexa are unremarkable.   Other: No abdominal wall hernia or abnormality. No  abdominopelvic ascites. No intraperitoneal free air.   Musculoskeletal: No acute or significant osseous findings.   IMPRESSION: 1. No acute localizing findings in the abdomen or pelvis. 2. Scattered colonic diverticulosis without evidence of acute diverticulitis. 3.  Aortic Atherosclerosis (ICD10-I70.0).   Electronically Signed   By: Harrietta Sherry M.D.   On: 12/01/2023 13:00  Past Medical/Family/Surgical/Social History: Medications & Allergies reviewed per EMR, new medications updated. Patient Active Problem List   Diagnosis Date Noted   Exposure to hepatitis B 07/01/2023   Need for immunization against viral hepatitis 07/01/2023   Screening for STDs (sexually transmitted diseases) 07/01/2023   History of substance use 07/01/2023   S/P appendectomy 05/11/2023   S/P laparoscopic appendectomy 05/11/2023   Gasping for breath 08/19/2021   PTSD (post-traumatic stress disorder) 08/19/2021   Parasomnia overlap disorder 08/19/2021   Snoring 08/19/2021   Excessive daytime sleepiness 08/19/2021   Sleep paralysis, recurrent isolated 08/19/2021   Asthma 02/19/2021   Multiple drug allergies 02/19/2021   Gluten intolerance 02/19/2021   Chronic rhinitis 02/19/2021   Metatarsalgia of left foot 11/14/2020   Achilles tendinosis of right lower extremity 11/14/2020   Essential tremor 07/04/2019   DOE (dyspnea on exertion) 01/20/2019   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 06/13/2018   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  Superficial basal cell carcinoma 06/13/2018   Upper airway cough syndrome 06/13/2018   Atrophic vaginitis 02/17/2018   Bilateral leg cramps 02/17/2018   Fatty liver, alcoholic 04/22/2016   Depression 04/19/2016   Withdrawal complaint 04/19/2016   Hx of adenomatous colonic polyps 08/03/2015   Herpes simplex 06/15/2014   GERD (gastroesophageal reflux disease) 10/14/2013   Anxiety state 10/14/2013   Recovering alcoholic  in remission (HCC) 10/14/2013   Allergic rhinitis 10/14/2013   Adjustment disorder with anxiety 10/14/2013   Elevated low-density lipoprotein level 10/14/2013   Essential hypertension 10/15/2012   Heart murmur 05/25/2012   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   chest - CX3 + 5FU   Squamous cell carcinoma of skin 09/27/2014   left thigh - tx p bx   Substance abuse (HCC)    Family History  Problem Relation Age of Onset   Dementia Mother    Diabetes Mother    Melanoma Father    Colon cancer Father 71       Survived   Heart disease Father    Heart attack Father    Rheum arthritis Father    Dementia Father    Healthy Sister    Colon cancer Brother    Breast cancer Maternal Grandmother    Colon cancer Paternal Grandmother    Dementia Maternal Aunt    Dementia Maternal Uncle    Colon polyps Paternal Aunt    Pancreatic cancer Paternal Aunt    Rectal cancer Paternal Aunt    Colon cancer Paternal Aunt    Stomach cancer Neg Hx    Esophageal cancer Neg Hx    Liver cancer Neg Hx    Past Surgical History:  Procedure Laterality Date   ANKLE SURGERY     Bilateral    CATARACT EXTRACTION Bilateral    CERVICAL SPINE SURGERY  07/21/2021   LAPAROSCOPIC APPENDECTOMY N/A 05/11/2023   Procedure: APPENDECTOMY LAPAROSCOPIC;  Surgeon: Sebastian Moles, MD;  Location: [facility] OR;  Service: General;  Laterality: N/A;   TUBAL LIGATION     Social History    Occupational History   Occupation: [employer]  Tobacco Use   Smoking status: Former    Current packs/day: 0.00    Types: Cigarettes    Start date: 10/16/1966    Quit date: 10/15/1996    Years since quitting: 27.2   Smokeless tobacco: Never  Vaping Use   Vaping status: Never Used  Substance and Sexual Activity   Alcohol use: No   Drug use: No   Sexual activity: Yes

## 2024-01-11 NOTE — Telephone Encounter (Signed)
 Pt states she is still having issues with abd pain. Pt scheduled to see Dr Stacia 01/20/24@10 :30am. Pt aware of appt.

## 2024-01-11 NOTE — Telephone Encounter (Signed)
 Left message for pt to call back

## 2024-01-11 NOTE — Progress Notes (Signed)
 Patient says that her pain is worse than it was at her last visit. She says that it hurts more when she bends at the hips, including when she leans forward to go from seated to standing. She has not noticed any change in her pain with the Meloxicam . 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 Her pain has remained in the same location.

## 2024-01-11 NOTE — Telephone Encounter (Signed)
 Inbound call from patient stating that she used to be a patient of Dr. Aneita and since he left she has been seeing [name] and was assigned to Dr. Stacia. Patient states that she has an appointment on 10/29 with [name] and wanted to be seen with another Docotor who could see her before the end of October. I advised her at this time all of our providers are booked until then. She states she would be searching in the meantime for another GI practice that could see her sooner and requested that I send a message to the nurse to call her back to discuss and to see if she can be seen sooner. Please advise.

## 2024-01-12 ENCOUNTER — Telehealth: Payer: Self-pay

## 2024-01-12 ENCOUNTER — Other Ambulatory Visit (HOSPITAL_COMMUNITY): Payer: Self-pay

## 2024-01-12 NOTE — Telephone Encounter (Signed)
 Pharmacy Patient Advocate Encounter   Received notification from CoverMyMeds that prior authorization for Dicyclomine  HCl 10MG  capsules is required/requested.   Insurance verification completed.   The patient is insured through [facility] [employer] .   Per test claim: PA required; PA submitted to above mentioned insurance via Latent Key/confirmation #/EOC [employer] Status is pending

## 2024-01-13 MED ORDER — DICYCLOMINE HCL 10 MG PO CAPS: 10.0000 mg | ORAL_CAPSULE | Freq: Three times a day (TID) | ORAL | 0 refills | Status: DC

## 2024-01-13 NOTE — Addendum Note (Signed)
 Addended by: LANETTE ALETHEA CROME on: 01/13/2024 11:01 AM   Modules accepted: Orders

## 2024-01-13 NOTE — Telephone Encounter (Signed)
 Pharmacy Patient Advocate Encounter  Received notification from [facility] Medicare that Prior Authorization for Dicyclomine  HCl 10MG  capsules has been APPROVED from 01-12-2024 to 05-10-2024   PA #/Case ID/Reference #: [id number]

## 2024-01-17 ENCOUNTER — Other Ambulatory Visit: Payer: Self-pay | Admitting: Sports Medicine

## 2024-01-17 ENCOUNTER — Other Ambulatory Visit: Payer: Self-pay | Admitting: *Deleted

## 2024-01-17 ENCOUNTER — Ambulatory Visit: Admitting: Sports Medicine

## 2024-01-17 DIAGNOSIS — S39011D Strain of muscle, fascia and tendon of abdomen, subsequent encounter: Secondary | ICD-10-CM

## 2024-01-17 DIAGNOSIS — R1032 Left lower quadrant pain: Secondary | ICD-10-CM

## 2024-01-17 DIAGNOSIS — T148XXA Other injury of unspecified body region, initial encounter: Secondary | ICD-10-CM

## 2024-01-20 ENCOUNTER — Encounter: Payer: Self-pay | Admitting: Gastroenterology

## 2024-01-20 ENCOUNTER — Ambulatory Visit: Admitting: Gastroenterology

## 2024-01-20 VITALS — BP 126/72 | HR 71 | Ht 67.0 in | Wt 155.5 lb

## 2024-01-20 DIAGNOSIS — R1030 Lower abdominal pain, unspecified: Secondary | ICD-10-CM

## 2024-01-20 DIAGNOSIS — R195 Other fecal abnormalities: Secondary | ICD-10-CM

## 2024-01-20 DIAGNOSIS — Z1231 Encounter for screening mammogram for malignant neoplasm of breast: Secondary | ICD-10-CM | POA: Diagnosis not present

## 2024-01-20 DIAGNOSIS — Z800 Family history of malignant neoplasm of digestive organs: Secondary | ICD-10-CM

## 2024-01-20 DIAGNOSIS — Z860101 Personal history of adenomatous and serrated colon polyps: Secondary | ICD-10-CM

## 2024-01-20 LAB — HM MAMMOGRAPHY

## 2024-01-20 NOTE — Progress Notes (Signed)
 Discussed the use of AI scribe software for clinical note transcription with the patient, who gave verbal consent to proceed.  HPI : Donna [name] is a 73 year old female with a history of anxiety, depression and skin cancer who presents for follow-up of persistent abdominal pain.  She was last seen in our office by [name] in July of this year.  She was having left sided abdominal pain which started after physical activity.  She had been to the emergency department and was given amoxicillin  for diverticulitis, although no diverticulitis was noted on CT scan.  Her abdominal pain was felt to be more musculoskeletal in nature.  She followed up with orthopedics, and her pain had improved.  However, she called the office again with recurring abdominal pain.  She was prescribed Bentyl , but this has not helped.  Today, she tells me she had been experiencing two distinct types of pain, one which she feels was musculoskeletal, but the other pain seems to be gastrointestinal.  However, both pains are significantly improved compared to a few weeks ago.  Since April, she has undergone multiple courses of antibiotics for various conditions, including a UTI, respiratory infection, and a cat bite, totaling five rounds. During this period, her bowel movements became less frequent and pencil-thin, but they are now improving. She has been taking probiotics and IBgard, which she feels have helped, and her bowel movements are returning to normal.  She has a strong family history of colon cancer on her paternal side, affecting her brother, father, grandmother, and aunt. Her last colonoscopy showed no polyps, and she is scheduled for a repeat in 2028.  She follows a high-fiber diet, regularly consuming oatmeal, vegetables, and fruits. Due to a gluten intolerance, she has recently started taking a gluten-free psyllium supplement to aid her gut health.    CT abdomen/pelvis July 2025  IMPRESSION: 1. No acute  localizing findings in the abdomen or pelvis. 2. Scattered colonic diverticulosis without evidence of acute diverticulitis. 3.  Aortic Atherosclerosis (ICD10-I70.0).   CT abdomen/pelvis December 2024 IMPRESSION: 1. Acute appendicitis. 2. Diverticulosis. 3. Aortic atherosclerosis (ICD10-I70.0).  March 2023 colonoscopy by Dr. Aneita:  Mild pandiverticulosis.  No polyps.   Good prep.   5-year repeat due to previous history of colon polyps and family history of colon cancer.   June 2019 colonoscopy --One 7 mm polyp in the ascending colon, removed with a cold snare. Resected and retrieved. - Mild diverticulosis in the sigmoid colon, in the descending colon and in the transverse colon. There was no evidence of diverticular bleeding. - The examination was otherwise normal on direct and retroflexion views. - Path = Tubular adenoma   October 2021 esophagram for dysphagia and cough --Small intermittent type I hiatal hernia, mild distal esophageal fold thickening possibly associated with esophagitis   November 2021 EGD for dysphagia and cough --Small hiatal hernia --No findings to explain patient's symptoms --Esophagus empirically dilated  Past Medical History:  Diagnosis Date   Anxiety    Asthma    Basal cell carcinoma 06/29/2017   right lip - CX3 + 5FU   BCC (basal cell carcinoma of skin) 10/29/2022   left lower leg, posterior - Mohs 01/27/2023   Cataract    bilateral lens implants   Cough variant asthma    Depression    GERD (gastroesophageal reflux disease)    Heart murmur    Hiatal hernia    Hyperlipidemia    IBS (irritable bowel syndrome)    SCC (  squamous cell carcinoma) 10/29/2022   right lower leg, anterior - Mohs 12/28/2022 Dr Gladis   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   chest - CX3 + 5FU   Squamous cell carcinoma of skin 09/27/2014   left thigh - tx p bx   Substance abuse [facility])      Past Surgical History:  Procedure Laterality  Date   ANKLE SURGERY     Bilateral    CATARACT EXTRACTION Bilateral    CERVICAL SPINE SURGERY  07/21/2021   LAPAROSCOPIC APPENDECTOMY N/A 05/11/2023   Procedure: APPENDECTOMY LAPAROSCOPIC;  Surgeon: Sebastian Moles, MD;  Location: [facility] OR;  Service: General;  Laterality: N/A;   TUBAL LIGATION     Family History  Problem Relation Age of Onset   Dementia Mother    Diabetes Mother    Melanoma Father    Colon cancer Father 68       Survived   Heart disease Father    Heart attack Father    Rheum arthritis Father    Dementia Father    Healthy Sister    Colon cancer Brother    Breast cancer Maternal Grandmother    Colon cancer Paternal Grandmother    Dementia Maternal Aunt    Dementia Maternal Uncle    Colon polyps Paternal Aunt    Pancreatic cancer Paternal Aunt    Rectal cancer Paternal Aunt    Colon cancer Paternal Aunt    Stomach cancer Neg Hx    Esophageal cancer Neg Hx    Liver cancer Neg Hx    Social History   Tobacco Use   Smoking status: Former    Current packs/day: 0.00    Types: Cigarettes    Start date: 10/16/1966    Quit date: 10/15/1996    Years since quitting: 27.2   Smokeless tobacco: Never  Vaping Use   Vaping status: Never Used  Substance Use Topics   Alcohol use: No   Drug use: No   Current Outpatient Medications  Medication Sig Dispense Refill   meloxicam  (MOBIC ) 15 MG tablet Take 1 tablet (15 mg total) by mouth daily. 30 tablet 0   acetaminophen  (TYLENOL ) 325 MG tablet Take 650 mg by mouth every 6 (six) hours as needed.     albuterol (VENTOLIN HFA) 108 (90 Base) MCG/ACT inhaler Inhale 2 puffs into the lungs every 4 (four) hours as needed.     amoxicillin -clavulanate (AUGMENTIN ) 875-125 MG tablet Take 1 tablet by mouth every 12 (twelve) hours. 14 tablet 0   b complex vitamins tablet Take 1 tablet by mouth daily.       budesonide -formoterol  (SYMBICORT ) 80-4.5 MCG/ACT inhaler Inhale 2 puffs into the lungs every 6 (six) hours as needed. 10.2 each 2    busPIRone (BUSPAR) 5 MG tablet Take 5 mg by mouth daily.     calcium  gluconate 500 MG tablet Take 500 mg by mouth daily. (Patient not taking: Reported on 12/06/2023)     dicyclomine  (BENTYL ) 10 MG capsule Take 1 capsule (10 mg total) by mouth 2 (two) times daily as needed for spasms. 60 capsule 1   dicyclomine  (BENTYL ) 10 MG capsule Take 1 capsule (10 mg total) by mouth 4 (four) times daily -  before meals and at bedtime. 90 capsule 0   Efinaconazole  (JUBLIA ) 10 % SOLN Apply 1 application  topically at bedtime. 8 mL 11   Estradiol  (YUVAFEM ) 10 MCG TABS vaginal tablet Place 1 tablet vaginally once a week. On Sundays  lisinopril  (ZESTRIL ) 5 MG tablet TAKE 1 TABLET BY MOUTH DAILY FOR ELEVATED BP WHILE TRAVELING 90 tablet 1   Magnesium 500 MG TABS Take 1 tablet by mouth daily.       Multiple Vitamin (MULTIVITAMIN) capsule Take 1 capsule by mouth daily.   (Patient not taking: Reported on 12/06/2023)     mupirocin  ointment (BACTROBAN ) 2 % Apply 1 Application topically 2 (two) times daily. 30 g 5   nystatin (MYCOSTATIN) 100000 UNIT/ML suspension Take 5 mLs by mouth as needed.     propranolol  (INDERAL ) 10 MG tablet Take 10 mg by mouth as needed.     umeclidinium bromide  (INCRUSE ELLIPTA ) 62.5 MCG/ACT AEPB Inhale 1 puff into the lungs daily. 30 each 1   valACYclovir  (VALTREX ) 500 MG tablet ONE BY MOUTH TWICE A DAY X 5 DAYS FOR HERPES OUTBREAK 10 tablet prn   No current facility-administered medications for this visit.   Allergies  Allergen Reactions   Ciprofloxacin     Neuropathy    Corticosteroids Other (See Comments)    Unable to take oral, topical or injectable    Doxycycline Nausea Only   Gluten Meal     GI upset    Incruse Ellipta  [Umeclidinium Bromide ]     Urinary retention   Montelukast  Other (See Comments)   Penicillins Nausea And Vomiting    breaking out   Prednisone Other (See Comments)    Hyperactivity, mood changes, nausea   Quinolones Other (See Comments)    REACTION: sick /  neuropathy    Statins Other (See Comments)     Review of Systems: All systems reviewed and negative except where noted in HPI.    No results found.  Physical Exam: BP 126/72 (BP Location: Left Arm, Patient Position: Sitting, Cuff Size: Normal)   Pulse 71   Ht 5' 7 (1.702 m)   Wt 155 lb 8 oz (70.5 kg)   BMI 24.35 kg/m  Constitutional: Pleasant,well-developed, Caucasian female in no acute distress. HEENT: Normocephalic and atraumatic. Conjunctivae are normal. No scleral icterus. Neck supple.  Cardiovascular: Normal rate, regular rhythm.  Pulmonary/chest: Effort normal and breath sounds normal. No wheezing, rales or rhonchi. Abdominal: Soft, nondistended, nontender. Bowel sounds active throughout. There are no masses palpable. No hepatomegaly. Extremities: no edema Neurological: Alert and oriented to person place and time. Skin: Skin is warm and dry. No rashes noted. Psychiatric: Normal mood and affect. Behavior is normal.  CBC    Component Value Date/Time   WBC 4.5 12/01/2023 1155   RBC 3.91 12/01/2023 1155   HGB 11.8 (L) 12/01/2023 1155   HCT 34.8 (L) 12/01/2023 1155   PLT 239 12/01/2023 1155   MCV 89.0 12/01/2023 1155   MCH 30.2 12/01/2023 1155   MCHC 33.9 12/01/2023 1155   RDW 12.8 12/01/2023 1155   LYMPHSABS 1.0 12/01/2023 1155   MONOABS 0.5 12/01/2023 1155   EOSABS 0.2 12/01/2023 1155   BASOSABS 0.0 12/01/2023 1155    CMP     Component Value Date/Time   NA 135 12/27/2023 0807   K 4.3 12/27/2023 0807   CL 98 12/27/2023 0807   CO2 22 12/27/2023 0807   GLUCOSE 97 12/27/2023 0807   GLUCOSE 92 12/01/2023 1155   BUN 11 12/27/2023 0807   CREATININE 0.72 12/27/2023 0807   CREATININE 0.73 07/02/2020 0924   CALCIUM  9.2 12/27/2023 0807   PROT 6.6 12/27/2023 0807   ALBUMIN 4.3 12/27/2023 0807   AST 26 12/27/2023 0807   ALT 17 12/27/2023 0807  ALKPHOS 74 12/27/2023 0807   BILITOT 0.4 12/27/2023 0807   GFRNONAA >60 12/01/2023 1155   GFRNONAA 85 07/02/2020  0924   GFRAA 98 07/02/2020 0924       Latest Ref Rng & Units 12/01/2023   11:55 AM 08/01/2023    9:42 AM 05/12/2023    8:08 AM  CBC EXTENDED  WBC 4.0 - 10.5 K/uL 4.5  4.1  5.4   RBC 3.87 - 5.11 MIL/uL 3.91  3.92  3.75   Hemoglobin 12.0 - 15.0 g/dL 88.1  87.7  88.6   HCT 36.0 - 46.0 % 34.8  34.3  33.3   Platelets 150 - 400 K/uL 239  239  233   NEUT# 1.7 - 7.7 K/uL 2.8  2.4    Lymph# 0.7 - 4.0 K/uL 1.0  1.1        ASSESSMENT AND PLAN:  73 year old female with several months of lower abdominal pain with changes in stool consistency, which have resolved spontaneously.  Patient had numerous courses of antibiotics earlier this year, which likely caused alterations in her gut flora, leading to changes in her bowel habits and bothersome GI symptoms.  CT scan was unremarkable, and she had a colonoscopy 2 years ago which was also unremarkable.  Do not feel that any further evaluation is necessary at this point given spontaneous improvement of her symptoms.  Recommended that she continue taking probiotics for another 1 to 2 months or until her symptoms are completely resolved.  Recommend she continue with high-fiber diet and daily psyllium.   Abdominal pain, change in stool consistency Antibiotics likely disrupted gut flora, causing altered bowel habits. Symptoms improving with probiotics and dietary changes. - Continue probiotics for 1-2 months until symptoms normalize. - Encourage high fiber diet with fruits, vegetables, and psyllium husk. - Continue IBgard as needed  Family history of colon cancer Strong family history of colon cancer. Last colonoscopy showed no polyps. Repeat colonoscopy scheduled for 2028. - Schedule repeat colonoscopy in 2028. - Follow up as needed if symptoms worsen.  Musculoskeletal abdominal wall pain New left-sided musculoskeletal pain improving. MRI ordered by another provider.  Recording duration: 11 minutes      Donique Hammonds E. Stacia, MD [facility]   Gerome Brunet, DO

## 2024-01-20 NOTE — Patient Instructions (Signed)
 Continue probiotics and fiber.  Please follow up as needed.  _______________________________________________________  If your blood pressure at your visit was 140/90 or greater, please contact your primary care physician to follow up on this.  _______________________________________________________  If you are age 72 or older, your body mass index should be between 23-30. Your Body mass index is 24.35 kg/m. If this is out of the aforementioned range listed, please consider follow up with your Primary Care Provider.  If you are age 57 or younger, your body mass index should be between 19-25. Your Body mass index is 24.35 kg/m. If this is out of the aformentioned range listed, please consider follow up with your Primary Care Provider.   ________________________________________________________  The [facility] providers would like to encourage you to use MYCHART to communicate with providers for non-urgent requests or questions.  Due to long hold times on the telephone, sending your provider a message by [facility] may be a faster and more efficient way to get a response.  Please allow 48 business hours for a response.  Please remember that this is for non-urgent requests.  _______________________________________________________  [facility] is using a team-based approach to care.  Your team is made up of your doctor and two to three APPS. Our APPS (Nurse Practitioners and Physician Assistants) work with your physician to ensure care continuity for you. They are fully qualified to address your health concerns and develop a treatment plan. They communicate directly with your gastroenterologist to care for you. Seeing the Advanced Practice Practitioners on your physician's team can help you by facilitating care more promptly, often allowing for earlier appointments, access to diagnostic testing, procedures, and other specialty referrals.

## 2024-01-24 ENCOUNTER — Encounter: Payer: Self-pay | Admitting: Sports Medicine

## 2024-01-30 ENCOUNTER — Ambulatory Visit
Admission: RE | Admit: 2024-01-30 | Discharge: 2024-01-30 | Disposition: A | Discharge status: Home / Self Care | Source: Ambulatory Visit | Attending: Sports Medicine | Admitting: Sports Medicine

## 2024-01-30 DIAGNOSIS — T148XXA Other injury of unspecified body region, initial encounter: Secondary | ICD-10-CM

## 2024-01-30 DIAGNOSIS — R1032 Left lower quadrant pain: Secondary | ICD-10-CM

## 2024-01-30 DIAGNOSIS — S39011D Strain of muscle, fascia and tendon of abdomen, subsequent encounter: Secondary | ICD-10-CM

## 2024-01-30 DIAGNOSIS — K5730 Diverticulosis of large intestine without perforation or abscess without bleeding: Secondary | ICD-10-CM | POA: Diagnosis not present

## 2024-02-07 ENCOUNTER — Ambulatory Visit: Admitting: Sports Medicine

## 2024-02-07 ENCOUNTER — Ambulatory Visit: Admitting: Dermatology

## 2024-02-07 ENCOUNTER — Ambulatory Visit (HOSPITAL_BASED_OUTPATIENT_CLINIC_OR_DEPARTMENT_OTHER): Admitting: Pulmonary Disease

## 2024-02-07 ENCOUNTER — Encounter: Payer: Self-pay | Admitting: Sports Medicine

## 2024-02-07 ENCOUNTER — Encounter: Payer: Self-pay | Admitting: Dermatology

## 2024-02-07 VITALS — BP 132/73

## 2024-02-07 DIAGNOSIS — D0462 Carcinoma in situ of skin of left upper limb, including shoulder: Secondary | ICD-10-CM | POA: Diagnosis not present

## 2024-02-07 DIAGNOSIS — R1032 Left lower quadrant pain: Secondary | ICD-10-CM | POA: Diagnosis not present

## 2024-02-07 DIAGNOSIS — S39011D Strain of muscle, fascia and tendon of abdomen, subsequent encounter: Secondary | ICD-10-CM | POA: Diagnosis not present

## 2024-02-07 DIAGNOSIS — D171 Benign lipomatous neoplasm of skin and subcutaneous tissue of trunk: Secondary | ICD-10-CM | POA: Diagnosis not present

## 2024-02-07 DIAGNOSIS — L570 Actinic keratosis: Secondary | ICD-10-CM

## 2024-02-07 DIAGNOSIS — W908XXA Exposure to other nonionizing radiation, initial encounter: Secondary | ICD-10-CM

## 2024-02-07 DIAGNOSIS — D485 Neoplasm of uncertain behavior of skin: Secondary | ICD-10-CM

## 2024-02-07 NOTE — Progress Notes (Signed)
   Follow-Up Visit   Subjective  Donna [name] is a 73 y.o. female who presents for the following: She has a spot on her right upper arm that has gotten a little and it is sensitive and one on the left forearm that was treated with LN2 but is still there. History of SCC and BCC. She has an appointment for [facility] in December.   The following portions of the chart were reviewed this encounter and updated as appropriate: medications, allergies, medical history  Review of Systems:  No other skin or systemic complaints except as noted in HPI or Assessment and Plan.  Objective  Well appearing patient in no apparent distress; mood and affect are within normal limits.   A focused examination was performed of the following areas: Arms   Relevant exam findings are noted in the Assessment and Plan.       Left Forearm - Posterior 3mm pink excoriated papule  Right upper arm, right forearm (3) Erythematous thin papules/macules with gritty scale.   Assessment & Plan     NEOPLASM OF UNCERTAIN BEHAVIOR OF SKIN Left Forearm - Posterior Skin / nail biopsy Type of biopsy: tangential   Informed consent: discussed and consent obtained   Timeout: patient name, date of birth, surgical site, and procedure verified   Procedure prep:  Patient was prepped and draped in usual sterile fashion Prep type:  Isopropyl alcohol Anesthesia: the lesion was anesthetized in a standard fashion   Anesthetic:  1% lidocaine  w/ epinephrine  1-100,000 buffered w/ 8.4% NaHCO3 Instrument used: flexible razor blade   Hemostasis achieved with: pressure, aluminum chloride and electrodesiccation   Outcome: patient tolerated procedure well   Post-procedure details: sterile dressing applied and wound care instructions given   Dressing type: bandage and petrolatum    Specimen 1 - Surgical pathology Differential Diagnosis: R/O SCC  Check Margins: No AK (ACTINIC KERATOSIS) (3) Right upper arm, right forearm  (3) Destruction of lesion - Right upper arm, right forearm (3) Complexity: simple   Destruction method: cryotherapy   Informed consent: discussed and consent obtained   Timeout:  patient name, date of birth, surgical site, and procedure verified Lesion destroyed using liquid nitrogen: Yes   Region frozen until ice ball extended beyond lesion: Yes   Outcome: patient tolerated procedure well with no complications   Post-procedure details: wound care instructions given     Return for Follow up as scheduled, [facility].  I, Roseline Hutchinson, CMA, am acting as scribe for [employer], DO .   Documentation: I have reviewed the above documentation for accuracy and completeness, and I agree with the above.  Delon Lenis, DO

## 2024-02-07 NOTE — Progress Notes (Signed)
 Patient says that she is feeling a bit better. She still has abdominal pain when she bends over, but has otherwise been increasing her activity again. She is having lateral leg pain again, but attributes that to inactivity as she has only been doing isometric exercises. She says that she is adding exercises back in, and adding to her daily movement by taking the stairs, etc. She is here today for MRI review.

## 2024-02-07 NOTE — Progress Notes (Signed)
 Donna [name] - 73 y.o. female MRN [id number]  Date of birth: Jun 08, 1950  Office Visit Note: Visit Date: 02/07/2024 PCP: Gerome Brunet, DO Referred by: Gerome Brunet, DO  Subjective: No chief complaint on file.  HPI: Donna [name] is a pleasant 73 y.o. female who presents today for follow-up of abdominal pain and previous external oblique strain, MRI review.  Donna [name] is feeling much improved from her previous visit about 1 month ago.  As reminder had acute onset of left sided abdominal/muscular pain at the end of July/early August which initially got much better but had a reexacerbation back in late August.  Given this and ongoing symptoms, we did order a musculoskeletal ultrasound of the abdomen and pelvis.  Donna [name] is doing much better from a pain standpoint, pain is usually anywhere from a 1-2/10 most on the left side of the abdomen.  She has pulled back from specific provocative motions.  She has slowly started adding exercises back in and has been able to walk and take the steps for appointments, etc. over the last week or so without any pain (which previously caused discomfort).  Pertinent ROS were reviewed with the patient and found to be negative unless otherwise specified above in HPI.   Assessment & Plan: Visit Diagnoses:  1. Abdominal muscle strain, subsequent encounter   2. LLQ abdominal pain   3. Lipoma of abdominal wall    Plan: Impression is resolving left sided abdominal pain consistent with external oblique/abdominal muscle strain.  We did discuss the nature of the soft tissue injuries and the chronicity involved.  Did have a thorough discussion regarding slowly returning to physical activity such as weightlifting, yoga, stretching on a progressive fashion.  Would like her to continue to avoid twisting and concentric/eccentric core strengthening exercises for the next 4-6 weeks before gentle return.  She has done formalized physical therapy in the past, she may resume  these at home in a progressive fashion.  We did review MRI which showed a small subcutaneous bulge over the right paramedian aspect of the abdomen, favored lipoma versus subcutaneous fascial restriction/hernia from previous appendectomy.  This does not involve the bowel/intestines, and there is no true hernia evaluated.  No need for surgical intervention at this time, will continue observation.  She will follow-up with me on an as-needed basis.  She did mention her IT band becoming slightly agitated recently with period of inactivity, she may follow-up for eval of that and consideration of further shockwave treatments.  Meds & Orders: No orders of the defined types were placed in this encounter.  No orders of the defined types were placed in this encounter.    Procedures: No procedures performed      Clinical History: No specialty comments available.  She reports that she quit smoking about 27 years ago. Her smoking use included cigarettes. She started smoking about 57 years ago. She has never used smokeless tobacco. No results for input(s): HGBA1C, LABURIC in the last 8760 hours.  Objective:    Physical Exam  Gen: Well-appearing, in no acute distress; non-toxic CV: Well-perfused. Warm.  Resp: Breathing unlabored on room air; no wheezing. Psych: Fluid speech in conversation; appropriate affect; normal thought process  Ortho Exam - Abdomen: No redness or swelling about the abdomen.  2 well-healed arthroscopic portal scars from previous appendectomy.  There is general fullness that becomes slightly more prominent with Valsalva over the right paramedian space, no significant pain, no evidence of strangulation.  There is mild pain  with activation of the external oblique musculature on the left side with sidebending and rotation away.  Imaging:  *I did review the MRI pelvis and abdomen both independently and during the visit today.  MR Pelvis w/o contrast CLINICAL DATA:  EVALUATE SOFT  TISSUE/MUSCLE; EVLAUATE SOFT TISSUE AND MUSCLE ONLY.  EXAM: MRI ABDOMEN AND PELVIS WITHOUT CONTRAST  TECHNIQUE: Multiplanar multisequence MR imaging of the abdomen and pelvis was performed. No intravenous contrast was administered.  COMPARISON:  None Available.  FINDINGS: COMBINED FINDINGS FOR BOTH MR ABDOMEN AND PELVIS  Lower chest: Unremarkable MR appearance to the lung bases. No pleural effusion. No pericardial effusion. Normal heart size.  Hepatobiliary: The liver is normal in size. Noncirrhotic configuration. No focal mass. No intrahepatic or extrahepatic bile duct dilatation. No choledocholithiasis. Unremarkable gallbladder.  Pancreas: No mass, inflammatory changes or other parenchymal abnormality identified. No main pancreatic duct dilation.  Spleen:  Within normal limits in size and appearance. No focal mass.  Adrenals/Urinary Tract: Unremarkable adrenal glands. No hydroureteronephrosis. No suspicious renal mass.  Stomach/Bowel: Visualized portions within the abdomen are unremarkable. No disproportionate dilation of bowel loops. Scattered colonic diverticula noted without diverticulitis.  Vascular/Lymphatic: No pathologically enlarged lymph nodes identified. No abdominal aortic aneurysm demonstrated. No ascites.  Reproductive organs: Normal-size anteverted uterus. No focal mass. Endometrium, cervix and vagina also appears within normal limits. Small/atrophic bilateral ovaries. No adnexal mass.  Other: There is focal asymmetric bulge in the subcutaneous fat over the right paramedian mid abdomen (series 10-2, image 74 and series 5, image 40). No associated soft tissue. Even though distinct borders are not seen, findings are most likely due to underlying subcutaneous lipoma.  Musculoskeletal: No suspicious bone lesions identified.  IMPRESSION: 1. Focal asymmetric bulge in the subcutaneous fat over the right paramedian mid abdomen, most likely due to underlying  subcutaneous lipoma. 2. No acute inflammatory process identified within the abdomen or pelvis. 3. Multiple other nonacute observations, as described above.  Electronically Signed   By: Ree Molt M.D.   On: 01/30/2024 10:29 MR ABDOMEN WO CONTRAST CLINICAL DATA:  EVALUATE SOFT TISSUE/MUSCLE; EVLAUATE SOFT TISSUE AND MUSCLE ONLY.  EXAM: MRI ABDOMEN AND PELVIS WITHOUT CONTRAST  TECHNIQUE: Multiplanar multisequence MR imaging of the abdomen and pelvis was performed. No intravenous contrast was administered.  COMPARISON:  None Available.  FINDINGS: COMBINED FINDINGS FOR BOTH MR ABDOMEN AND PELVIS  Lower chest: Unremarkable MR appearance to the lung bases. No pleural effusion. No pericardial effusion. Normal heart size.  Hepatobiliary: The liver is normal in size. Noncirrhotic configuration. No focal mass. No intrahepatic or extrahepatic bile duct dilatation. No choledocholithiasis. Unremarkable gallbladder.  Pancreas: No mass, inflammatory changes or other parenchymal abnormality identified. No main pancreatic duct dilation.  Spleen:  Within normal limits in size and appearance. No focal mass.  Adrenals/Urinary Tract: Unremarkable adrenal glands. No hydroureteronephrosis. No suspicious renal mass.  Stomach/Bowel: Visualized portions within the abdomen are unremarkable. No disproportionate dilation of bowel loops. Scattered colonic diverticula noted without diverticulitis.  Vascular/Lymphatic: No pathologically enlarged lymph nodes identified. No abdominal aortic aneurysm demonstrated. No ascites.  Reproductive organs: Normal-size anteverted uterus. No focal mass. Endometrium, cervix and vagina also appears within normal limits. Small/atrophic bilateral ovaries. No adnexal mass.  Other: There is focal asymmetric bulge in the subcutaneous fat over the right paramedian mid abdomen (series 10-2, image 74 and series 5, image 40). No associated soft tissue. Even though  distinct borders are not seen, findings are most likely due to underlying subcutaneous lipoma.  Musculoskeletal: No suspicious  bone lesions identified.  IMPRESSION: 1. Focal asymmetric bulge in the subcutaneous fat over the right paramedian mid abdomen, most likely due to underlying subcutaneous lipoma. 2. No acute inflammatory process identified within the abdomen or pelvis. 3. Multiple other nonacute observations, as described above.  Electronically Signed   By: Ree Molt M.D.   On: 01/30/2024 10:29    Past Medical/Family/Surgical/Social History: Medications & Allergies reviewed per EMR, new medications updated. Patient Active Problem List   Diagnosis Date Noted   Family history of colon cancer 01/20/2024   Exposure to hepatitis B 07/01/2023   Need for immunization against viral hepatitis 07/01/2023   Screening for STDs (sexually transmitted diseases) 07/01/2023   History of substance use 07/01/2023   S/P appendectomy 05/11/2023   S/P laparoscopic appendectomy 05/11/2023   Gasping for breath 08/19/2021   PTSD (post-traumatic stress disorder) 08/19/2021   Parasomnia overlap disorder 08/19/2021   Snoring 08/19/2021   Excessive daytime sleepiness 08/19/2021   Sleep paralysis, recurrent isolated 08/19/2021   Asthma 02/19/2021   Multiple drug allergies 02/19/2021   Gluten intolerance 02/19/2021   Chronic rhinitis 02/19/2021   Metatarsalgia of left foot 11/14/2020   Achilles tendinosis of right lower extremity 11/14/2020   Essential tremor 07/04/2019   DOE (dyspnea on exertion) 01/20/2019   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 06/13/2018   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   Superficial basal cell carcinoma 06/13/2018   Upper airway cough syndrome 06/13/2018   Atrophic vaginitis 02/17/2018   Bilateral leg cramps 02/17/2018   Fatty liver, alcoholic 04/22/2016   Depression 04/19/2016   Withdrawal complaint  04/19/2016   Hx of adenomatous colonic polyps 08/03/2015   Herpes simplex 06/15/2014   GERD (gastroesophageal reflux disease) 10/14/2013   Anxiety state 10/14/2013   Recovering alcoholic in remission (HCC) 10/14/2013   Allergic rhinitis 10/14/2013   Adjustment disorder with anxiety 10/14/2013   Elevated low-density lipoprotein level 10/14/2013   Essential hypertension 10/15/2012   Heart murmur 05/25/2012   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   chest - CX3 + 5FU   Squamous cell carcinoma of skin 09/27/2014   left thigh - tx p bx   Substance abuse (HCC)    Family History  Problem Relation Age of Onset   Dementia Mother    Diabetes Mother    Melanoma Father    Colon cancer Father 33       Survived   Heart disease Father    Heart attack Father    Rheum arthritis Father    Dementia Father    Healthy Sister    Colon cancer Brother    Breast cancer Maternal Grandmother    Colon cancer Paternal Grandmother    Dementia Maternal Aunt    Dementia Maternal Uncle    Colon polyps  Paternal Aunt    Pancreatic cancer Paternal Aunt    Rectal cancer Paternal Aunt    Colon cancer Paternal Aunt    Stomach cancer Neg Hx    Esophageal cancer Neg Hx    Liver cancer Neg Hx    Past Surgical History:  Procedure Laterality Date   ANKLE SURGERY     Bilateral    CATARACT EXTRACTION Bilateral    CERVICAL SPINE SURGERY  07/21/2021   LAPAROSCOPIC  APPENDECTOMY N/A 05/11/2023   Procedure: APPENDECTOMY LAPAROSCOPIC;  Surgeon: Sebastian Moles, MD;  Location: [facility];  Service: General;  Laterality: N/A;   TUBAL LIGATION     Social History   Occupational History   Occupation: [employer]  Tobacco Use   Smoking status: Former    Current packs/day: 0.00    Types: Cigarettes    Start date: 10/16/1966    Quit date: 10/15/1996    Years since quitting: 27.3   Smokeless tobacco: Never  Vaping Use   Vaping status: Never Used  Substance and Sexual Activity   Alcohol use: No   Drug use: No   Sexual activity: Yes   I spent 34 minutes in the care of the patient today including face-to-face time, preparation to see the patient, as well as independent review of abdomen and pelvis MRI as well as interpretation with the patient today in the room, discussion on physical activity and workout modifications, discussion on additional surgical evaluation that could be considered at a later time for the above diagnoses.   Lonell Sprang, DO Primary Care Sports Medicine Physician  [facility]  This note was dictated using Dragon naturally speaking software and may contain errors in syntax, spelling, or content which have not been identified prior to signing this note.

## 2024-02-07 NOTE — Patient Instructions (Addendum)

## 2024-02-08 ENCOUNTER — Ambulatory Visit: Payer: Medicare HMO | Admitting: Psychology

## 2024-02-08 LAB — SURGICAL PATHOLOGY

## 2024-02-09 ENCOUNTER — Ambulatory Visit: Payer: Self-pay | Admitting: Dermatology

## 2024-02-09 NOTE — Progress Notes (Signed)
 His Shirron,  Please call patient and let her know the results of the bx were positive for a squamous cell in situ (only in the top layer of skin).  While the biopsy removes the bulk of the lesion I will recheck it at her full body skin exam in December to see if any remnants are there after the area heals.  She should continue applying topical vaseline or aquaphor twice a day in the meantime.    Diagnosis Skin , left forearm - posterior SQUAMOUS CELL CARCINOMA IN SITU, ULCERATED

## 2024-02-14 ENCOUNTER — Ambulatory Visit: Admitting: Dermatology

## 2024-02-14 DIAGNOSIS — H43813 Vitreous degeneration, bilateral: Secondary | ICD-10-CM | POA: Diagnosis not present

## 2024-02-14 DIAGNOSIS — H52203 Unspecified astigmatism, bilateral: Secondary | ICD-10-CM | POA: Diagnosis not present

## 2024-02-16 ENCOUNTER — Encounter: Payer: Self-pay | Admitting: Sports Medicine

## 2024-02-16 ENCOUNTER — Other Ambulatory Visit: Payer: Self-pay

## 2024-02-16 ENCOUNTER — Ambulatory Visit: Admitting: Sports Medicine

## 2024-02-16 DIAGNOSIS — M222X2 Patellofemoral disorders, left knee: Secondary | ICD-10-CM | POA: Diagnosis not present

## 2024-02-16 DIAGNOSIS — M25562 Pain in left knee: Secondary | ICD-10-CM

## 2024-02-16 DIAGNOSIS — M7632 Iliotibial band syndrome, left leg: Secondary | ICD-10-CM

## 2024-02-16 DIAGNOSIS — M6281 Muscle weakness (generalized): Secondary | ICD-10-CM | POA: Diagnosis not present

## 2024-02-16 DIAGNOSIS — D171 Benign lipomatous neoplasm of skin and subcutaneous tissue of trunk: Secondary | ICD-10-CM | POA: Diagnosis not present

## 2024-02-16 NOTE — Progress Notes (Signed)
 Patient says that her left leg is having similar pain as it has previously. She believes that her pain has flared up due to her decrease in activity as her abdomen has been healing. She did walk more, which seemed to help her leg, but she is hoping to stay on top of her symptoms so they do not get worse. She has a trip to [place]  in two weeks where she will be doing a lot of walking, and is hopeful that further shockwave or other treatment can help her pain prior to, and during, that trip.

## 2024-02-16 NOTE — Progress Notes (Signed)
 Donna [name] - 73 y.o. female MRN [id number]  Date of birth: 11/09/1950  Office Visit Note: Visit Date: 02/16/2024 PCP: Donna Brunet, DO Referred by: Donna Brunet, DO  Subjective: Chief Complaint  Patient presents with   Left Leg - Follow-up   HPI: Donna [name] is a pleasant 73 y.o. female who presents today for evaluation of left lateral hip/IT band, left knee, abd strain (MRI review).  Left leg/IT-band -she is struggling with her left leg again.  Back in December of this past year she felt really good after a few sessions of formalized physical therapy working on some functional aspects, we did this in the setting of shockwave therapy which proved beneficial for her.  She does not feel she has the function and stability as she wishes. Has used tumeric in the past, is using Boswellia but has not taken recently.  Is leaving for a trip to [place]  in 2 weeks where she will be doing a lot of walking.  Abdominal strain/lipoma -she still feels some discomfort on the right side of the abdomen with certain maneuvers such as bending or leaning forward or physical activity.  This is certainly much better than it has been in the past but she still notices this.  She is admittedly quite worried that she does have a subcutaneous lipoma, although we did discuss the benign nature of this.  She would like to see a general surgeon to be evaluated even if they do not feel it needs removed.  Pertinent ROS were reviewed with the patient and found to be negative unless otherwise specified above in HPI.   Assessment & Plan: Visit Diagnoses:  1. Patellofemoral disorder of left knee   2. It band syndrome, left   3. Weakness of left quadriceps muscle   4. Lipoma of abdominal wall    Plan: Impression is acute on chronic left leg pain which is multifactorial as she does have IT band syndrome with some lateral based hip pain in the TFL and gluteal crossover.  She has fairly well-preserved hip abduction  strength but does have some generalized quadricep functional weakness.  She responded well in the past with extracorporeal shockwave therapy, we did repeat this for the lateral hip and down the proximal and distal IT band today.  Would like her to resume her IT band stretching exercises we have given in the past.  For her functional deficits, we will send her back to formalized physical therapy which she had about 3 sessions back at the end of 2024 which were significantly helpful for her, refer Donna [name] if possible.  I do think she has a degree of underlying patellofemoral disorder as well that is contributing, but only mild underlying PF arthritic change.   In terms of her abdomen, she did undergo an abdominal strain in the process of workup during MRI we did notice a subcutaneous bulge, likely indicative of subcutaneous lipoma.  Discussed the benign nature of this, there is no true hernia or intra-abdominal herniation.  This is still bothering her intermittently, given her history of malignant conditions, she would like to be evaluated by a general surgeon.  Canaan is aware this likely would not be corrected surgically, but she would like an evaluation at least, which I feel is reasonable.  Referral sent today.  Follow-up: Return in about 1 week (around 02/23/2024).   Meds & Orders: No orders of the defined types were placed in this encounter.   Orders Placed This Encounter  Procedures   XR Knee Complete 4 Views Left   Ambulatory referral to Physical Therapy   Ambulatory referral to General Surgery     Procedures: No procedures performed      Clinical History: No specialty comments available.  She reports that she quit smoking about 27 years ago. Her smoking use included cigarettes. She started smoking about 57 years ago. She has never used smokeless tobacco. No results for input(s): HGBA1C, LABURIC in the last 8760 hours.  Objective:    Physical Exam  Gen: Well-appearing,  in no acute distress; non-toxic CV: Well-perfused. Warm.  Resp: Breathing unlabored on room air; no wheezing. Psych: Fluid speech in conversation; appropriate affect; normal thought process  Ortho Exam - Abdomen: Well-healed laparoscopic incisions from previous appendectomy.  No bloating or distention.  There is mild pain with palpation over the right paramedian aspect at the level of the umbilicus and just superior.  There is no herniation noted with valsalva.   - Left leg: There is some tenderness around the greater trochanteric region and the TFL/proximal IT band crossover point.  There is good preservation of hip abduction strength testing.  The hip moves fluidly with internal and external logroll.  There is tenderness near the proximal aspect of the IT band and distally just proximal to Gerdy's tubercle.  Negative Noble's compression test.  There is a degree of proximal quadricep muscular trophy.  + Patellofemoral crepitus which resolves after manual patellar stabilization.  Positive patellar grind test.  Imaging: XR Knee Complete 4 Views Left Result Date: 02/16/2024 4 views of the left knee including standing AP, Rosenberg, lateral and sunrise view were ordered and reviewed by myself today.  X-rays demonstrate preserved weightbearing surfaces without any notable arthritic change.  There is mild patellofemoral narrowing with early lateral patellar tilt although's certainly no significant patellofemoral arthritis.  No fracture or otherwise acute bony abnormality noted.   *I did review the MRI of the abdomen and MR pelvis both independently and with Donna [name] in the room today  Narrative & Impression  CLINICAL DATA:  EVALUATE SOFT TISSUE/MUSCLE; EVLAUATE SOFT TISSUE AND MUSCLE ONLY.   EXAM: MRI ABDOMEN AND PELVIS WITHOUT CONTRAST   TECHNIQUE: Multiplanar multisequence MR imaging of the abdomen and pelvis was performed. No intravenous contrast was administered.   COMPARISON:  None  Available.   FINDINGS: COMBINED FINDINGS FOR BOTH MR ABDOMEN AND PELVIS   Lower chest: Unremarkable MR appearance to the lung bases. No pleural effusion. No pericardial effusion. Normal heart size.   Hepatobiliary: The liver is normal in size. Noncirrhotic configuration. No focal mass. No intrahepatic or extrahepatic bile duct dilatation. No choledocholithiasis. Unremarkable gallbladder.   Pancreas: No mass, inflammatory changes or other parenchymal abnormality identified. No main pancreatic duct dilation.   Spleen:  Within normal limits in size and appearance. No focal mass.   Adrenals/Urinary Tract: Unremarkable adrenal glands. No hydroureteronephrosis. No suspicious renal mass.   Stomach/Bowel: Visualized portions within the abdomen are unremarkable. No disproportionate dilation of bowel loops. Scattered colonic diverticula noted without diverticulitis.   Vascular/Lymphatic: No pathologically enlarged lymph nodes identified. No abdominal aortic aneurysm demonstrated. No ascites.   Reproductive organs: Normal-size anteverted uterus. No focal mass. Endometrium, cervix and vagina also appears within normal limits. Small/atrophic bilateral ovaries. No adnexal mass.   Other: There is focal asymmetric bulge in the subcutaneous fat over the right paramedian mid abdomen (series 10-2, image 74 and series 5, image 40). No associated soft tissue. Even though distinct borders are not seen, findings  are most likely due to underlying subcutaneous lipoma.   Musculoskeletal: No suspicious bone lesions identified.   IMPRESSION: 1. Focal asymmetric bulge in the subcutaneous fat over the right paramedian mid abdomen, most likely due to underlying subcutaneous lipoma. 2. No acute inflammatory process identified within the abdomen or pelvis. 3. Multiple other nonacute observations, as described above.     Electronically Signed   By: Ree Molt M.D.   On: 01/30/2024 10:29      Narrative & Impression  CLINICAL DATA:  EVALUATE SOFT TISSUE/MUSCLE; EVLAUATE SOFT TISSUE AND MUSCLE ONLY.   EXAM: MRI ABDOMEN AND PELVIS WITHOUT CONTRAST   TECHNIQUE: Multiplanar multisequence MR imaging of the abdomen and pelvis was performed. No intravenous contrast was administered.   COMPARISON:  None Available.   FINDINGS: COMBINED FINDINGS FOR BOTH MR ABDOMEN AND PELVIS   Lower chest: Unremarkable MR appearance to the lung bases. No pleural effusion. No pericardial effusion. Normal heart size.   Hepatobiliary: The liver is normal in size. Noncirrhotic configuration. No focal mass. No intrahepatic or extrahepatic bile duct dilatation. No choledocholithiasis. Unremarkable gallbladder.   Pancreas: No mass, inflammatory changes or other parenchymal abnormality identified. No main pancreatic duct dilation.   Spleen:  Within normal limits in size and appearance. No focal mass.   Adrenals/Urinary Tract: Unremarkable adrenal glands. No hydroureteronephrosis. No suspicious renal mass.   Stomach/Bowel: Visualized portions within the abdomen are unremarkable. No disproportionate dilation of bowel loops. Scattered colonic diverticula noted without diverticulitis.   Vascular/Lymphatic: No pathologically enlarged lymph nodes identified. No abdominal aortic aneurysm demonstrated. No ascites.   Reproductive organs: Normal-size anteverted uterus. No focal mass. Endometrium, cervix and vagina also appears within normal limits. Small/atrophic bilateral ovaries. No adnexal mass.   Other: There is focal asymmetric bulge in the subcutaneous fat over the right paramedian mid abdomen (series 10-2, image 74 and series 5, image 40). No associated soft tissue. Even though distinct borders are not seen, findings are most likely due to underlying subcutaneous lipoma.   Musculoskeletal: No suspicious bone lesions identified.   IMPRESSION: 1. Focal asymmetric bulge in the subcutaneous  fat over the right paramedian mid abdomen, most likely due to underlying subcutaneous lipoma. 2. No acute inflammatory process identified within the abdomen or pelvis. 3. Multiple other nonacute observations, as described above.     Electronically Signed   By: Ree Molt M.D.   On: 01/30/2024 10:29   Past Medical/Family/Surgical/Social History: Medications & Allergies reviewed per EMR, new medications updated. Patient Active Problem List   Diagnosis Date Noted   Family history of colon cancer 01/20/2024   Exposure to hepatitis B 07/01/2023   Need for immunization against viral hepatitis 07/01/2023   Screening for STDs (sexually transmitted diseases) 07/01/2023   History of substance use 07/01/2023   S/P appendectomy 05/11/2023   S/P laparoscopic appendectomy 05/11/2023   Gasping for breath 08/19/2021   PTSD (post-traumatic stress disorder) 08/19/2021   Parasomnia overlap disorder 08/19/2021   Snoring 08/19/2021   Excessive daytime sleepiness 08/19/2021   Sleep paralysis, recurrent isolated 08/19/2021   Asthma 02/19/2021   Multiple drug allergies 02/19/2021   Gluten intolerance 02/19/2021   Chronic rhinitis 02/19/2021   Metatarsalgia of left foot 11/14/2020   Achilles tendinosis of right lower extremity 11/14/2020   Essential tremor 07/04/2019   DOE (dyspnea on exertion) 01/20/2019   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 06/13/2018   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   Superficial basal cell carcinoma 06/13/2018  Upper airway cough syndrome 06/13/2018   Atrophic vaginitis 02/17/2018   Bilateral leg cramps 02/17/2018   Fatty liver, alcoholic 04/22/2016   Depression 04/19/2016   Withdrawal complaint 04/19/2016   Hx of adenomatous colonic polyps 08/03/2015   Herpes simplex 06/15/2014   GERD (gastroesophageal reflux disease) 10/14/2013   Anxiety state 10/14/2013   Recovering alcoholic in remission (HCC) 10/14/2013    Allergic rhinitis 10/14/2013   Adjustment disorder with anxiety 10/14/2013   Elevated low-density lipoprotein level 10/14/2013   Essential hypertension 10/15/2012   Heart murmur 05/25/2012   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   chest - CX3 + 5FU   Squamous cell carcinoma of skin 09/27/2014   left thigh - tx p bx   Substance abuse (HCC)    Family History  Problem Relation Age of Onset   Dementia Mother    Diabetes Mother    Melanoma Father    Colon cancer Father 18       Survived   Heart disease Father    Heart attack Father    Rheum arthritis Father    Dementia Father    Healthy Sister    Colon cancer Brother    Breast cancer Maternal Grandmother    Colon cancer Paternal Grandmother    Dementia Maternal Aunt    Dementia Maternal Uncle    Colon polyps Paternal Aunt    Pancreatic cancer Paternal Aunt    Rectal cancer Paternal Aunt    Colon cancer Paternal Aunt    Stomach cancer Neg Hx    Esophageal cancer Neg Hx    Liver cancer Neg Hx    Past Surgical History:  Procedure Laterality Date   ANKLE SURGERY     Bilateral    CATARACT EXTRACTION Bilateral    CERVICAL SPINE SURGERY  07/21/2021   LAPAROSCOPIC APPENDECTOMY N/A 05/11/2023   Procedure: APPENDECTOMY LAPAROSCOPIC;  Surgeon: Sebastian Moles, MD;  Location: [facility];  Service: General;  Laterality: N/A;   TUBAL LIGATION     Social History   Occupational History    Occupation: [employer]  Tobacco Use   Smoking status: Former    Current packs/day: 0.00    Types: Cigarettes    Start date: 10/16/1966    Quit date: 10/15/1996    Years since quitting: 27.3   Smokeless tobacco: Never  Vaping Use   Vaping status: Never Used  Substance and Sexual Activity   Alcohol use: No   Drug use: No   Sexual activity: Yes

## 2024-02-23 ENCOUNTER — Encounter: Payer: Self-pay | Admitting: Sports Medicine

## 2024-02-23 ENCOUNTER — Ambulatory Visit: Admitting: Sports Medicine

## 2024-02-23 DIAGNOSIS — S39011D Strain of muscle, fascia and tendon of abdomen, subsequent encounter: Secondary | ICD-10-CM | POA: Diagnosis not present

## 2024-02-23 DIAGNOSIS — M7632 Iliotibial band syndrome, left leg: Secondary | ICD-10-CM

## 2024-02-23 DIAGNOSIS — M6281 Muscle weakness (generalized): Secondary | ICD-10-CM | POA: Diagnosis not present

## 2024-02-23 NOTE — Progress Notes (Signed)
 Donna [name] - 73 y.o. female MRN [id number]  Date of birth: 02-Apr-1951  Office Visit Note: Visit Date: 02/23/2024 PCP: Gerome Brunet, DO Referred by: Gerome Brunet, DO  Subjective: Chief Complaint  Patient presents with   Left Leg - Follow-up   HPI: Donna [name] is a pleasant 73 y.o. female who presents today for follow-up of left lateral hip/IT band, abdominal strain.  The left lateral hip and IT band is feeling better, not feeling much discomfort at all on the proximal and but more so over the distal IT band.  Last treatment of extracorporeal shockwave therapy was helpful.  In terms of her abdomen, was doing some light cat/cow exercises which did flare this up.  She does have an appointment with Dr. Vicenta Poli on 02/29/2024 to evaluate her subcutaneous lipoma.  Pertinent ROS were reviewed with the patient and found to be negative unless otherwise specified above in HPI.   Assessment & Plan: Visit Diagnoses:  1. It band syndrome, left   2. Weakness of left quadriceps muscle   3. Abdominal muscle strain, subsequent encounter    Plan: Impression is left IT band syndrome, more tenderness and tightness in the distal IT band today.  We did proceed with extracorporeal shockwave therapy, patient tolerated well.  I would like her to continue her IT band stretching as well as her lateral hip abduction strengthening.  To help with her quadricep weakness, she does have formalized physical therapy appointment set later this month upon her return from her trip to [place] .  She will progress through this.  I would like her to have a few sessions of PT and then follow-up with me around mid November.  She may use meloxicam  15 mg or Tylenol  only as needed for pain.  Will hold from abdominal exercises until her appointment with Dr. Poli and then follow-up in mid November to discuss more vigorous activity.  Follow-up: Return in about 1 month (around 03/25/2024) for IT-band, abdomen  f/u.   Meds & Orders: No orders of the defined types were placed in this encounter.  No orders of the defined types were placed in this encounter.    Procedures: Procedure: ECSWT Indications:  ITB syndrome   Procedure Details Consent: Risks of procedure as well as the alternatives and risks of each were explained to the patient.  Verbal consent for procedure obtained. Time Out: Verified patient identification, verified procedure, site was marked, verified correct patient position. The area was cleaned with alcohol swab.     The left IT-Band was targeted for Extracorporeal shockwave therapy.    Preset: Muscular injury Power Level: 100-110 mJ   Frequency: 10-11 Hz Impulse/cycles: 2750 Head size: Regular      Clinical History: No specialty comments available.  She reports that she quit smoking about 27 years ago. Her smoking use included cigarettes. She started smoking about 57 years ago. She has never used smokeless tobacco. No results for input(s): HGBA1C, LABURIC in the last 8760 hours.  Objective:    Physical Exam  Gen: Well-appearing, in no acute distress; non-toxic CV: Well-perfused. Warm.  Resp: Breathing unlabored on room air; no wheezing. Psych: Fluid speech in conversation; appropriate affect; normal thought process  Ortho Exam - Left hip/leg/ITB: No greater trochanter TTP, no redness or swelling.  There is tenderness and some tightness of the distal IT band just proximal to the lateral knee.  No swelling about the knee.  Imaging: No results found.  Past Medical/Family/Surgical/Social History: Medications & Allergies  reviewed per EMR, new medications updated. Patient Active Problem List   Diagnosis Date Noted   Family history of colon cancer 01/20/2024   Exposure to hepatitis B 07/01/2023   Need for immunization against viral hepatitis 07/01/2023   Screening for STDs (sexually transmitted diseases) 07/01/2023   History of substance use 07/01/2023   S/P  appendectomy 05/11/2023   S/P laparoscopic appendectomy 05/11/2023   Gasping for breath 08/19/2021   PTSD (post-traumatic stress disorder) 08/19/2021   Parasomnia overlap disorder 08/19/2021   Snoring 08/19/2021   Excessive daytime sleepiness 08/19/2021   Sleep paralysis, recurrent isolated 08/19/2021   Asthma 02/19/2021   Multiple drug allergies 02/19/2021   Gluten intolerance 02/19/2021   Chronic rhinitis 02/19/2021   Metatarsalgia of left foot 11/14/2020   Achilles tendinosis of right lower extremity 11/14/2020   Essential tremor 07/04/2019   DOE (dyspnea on exertion) 01/20/2019   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 06/13/2018   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   Superficial basal cell carcinoma 06/13/2018   Upper airway cough syndrome 06/13/2018   Atrophic vaginitis 02/17/2018   Bilateral leg cramps 02/17/2018   Fatty liver, alcoholic 04/22/2016   Depression 04/19/2016   Withdrawal complaint 04/19/2016   Hx of adenomatous colonic polyps 08/03/2015   Herpes simplex 06/15/2014   GERD (gastroesophageal reflux disease) 10/14/2013   Anxiety state 10/14/2013   Recovering alcoholic in remission (HCC) 10/14/2013   Allergic rhinitis 10/14/2013   Adjustment disorder with anxiety 10/14/2013   Elevated low-density lipoprotein level 10/14/2013   Essential hypertension 10/15/2012   Heart murmur 05/25/2012   Past Medical History:  Diagnosis Date   Anxiety    Asthma    Basal cell carcinoma 06/29/2017   right lip - CX3 + 5FU   BCC (basal cell carcinoma of skin) 10/29/2022   left lower leg, posterior - Mohs 01/27/2023   Cataract    bilateral lens implants   Cough variant asthma    Depression    GERD (gastroesophageal reflux disease)    Heart murmur    Hiatal hernia    Hyperlipidemia    IBS (irritable bowel syndrome)    SCC (squamous cell carcinoma) 10/29/2022   right lower leg, anterior - Mohs 12/28/2022 Dr Gladis   Squamous  cell carcinoma in situ (SCCIS) of conjunctiva 10/23/2008   chest - CX3 + 5FU   Squamous cell carcinoma of skin 09/27/2014   left thigh - tx p bx   Substance abuse (HCC)    Family History  Problem Relation Age of Onset   Dementia Mother    Diabetes Mother    Melanoma Father    Colon cancer Father 76       Survived   Heart disease Father    Heart attack Father    Rheum arthritis Father    Dementia Father    Healthy Sister    Colon cancer Brother    Breast cancer Maternal Grandmother    Colon cancer Paternal Grandmother    Dementia Maternal Aunt    Dementia Maternal Uncle    Colon polyps Paternal Aunt    Pancreatic cancer Paternal Aunt    Rectal cancer Paternal Aunt    Colon cancer Paternal Aunt    Stomach cancer Neg Hx    Esophageal cancer Neg Hx    Liver cancer Neg Hx    Past Surgical History:  Procedure Laterality Date   ANKLE SURGERY     Bilateral    CATARACT EXTRACTION  Bilateral    CERVICAL SPINE SURGERY  07/21/2021   LAPAROSCOPIC APPENDECTOMY N/A 05/11/2023   Procedure: APPENDECTOMY LAPAROSCOPIC;  Surgeon: Sebastian Moles, MD;  Location: [facility] OR;  Service: General;  Laterality: N/A;   TUBAL LIGATION     Social History   Occupational History   Occupation: [employer]  Tobacco Use   Smoking status: Former    Current packs/day: 0.00    Types: Cigarettes    Start date: 10/16/1966    Quit date: 10/15/1996    Years since quitting: 27.3   Smokeless tobacco: Never  Vaping Use   Vaping status: Never Used  Substance and Sexual Activity   Alcohol use: No   Drug use: No   Sexual activity: Yes

## 2024-02-23 NOTE — Progress Notes (Signed)
 Patient says that her leg is feeling much improved since her last shockwave therapy. She has physical therapy scheduled after she returns from her trip to [place] . She mentions that her abdomen seems to be improving, although she did have a flare up of pain when doing cat/cows on her own. She has an appointment scheduled with Dr. Vernetta in general surgery at the end of the month.

## 2024-03-01 ENCOUNTER — Encounter (HOSPITAL_BASED_OUTPATIENT_CLINIC_OR_DEPARTMENT_OTHER): Payer: Self-pay

## 2024-03-03 ENCOUNTER — Other Ambulatory Visit: Payer: Self-pay | Admitting: Dermatology

## 2024-03-06 ENCOUNTER — Encounter: Payer: Self-pay | Admitting: Physical Therapy

## 2024-03-06 ENCOUNTER — Ambulatory Visit (INDEPENDENT_AMBULATORY_CARE_PROVIDER_SITE_OTHER): Admitting: Physical Therapy

## 2024-03-06 DIAGNOSIS — R262 Difficulty in walking, not elsewhere classified: Secondary | ICD-10-CM | POA: Diagnosis not present

## 2024-03-06 DIAGNOSIS — M79605 Pain in left leg: Secondary | ICD-10-CM

## 2024-03-06 DIAGNOSIS — M6281 Muscle weakness (generalized): Secondary | ICD-10-CM | POA: Diagnosis not present

## 2024-03-06 DIAGNOSIS — M25552 Pain in left hip: Secondary | ICD-10-CM

## 2024-03-06 DIAGNOSIS — R29898 Other symptoms and signs involving the musculoskeletal system: Secondary | ICD-10-CM | POA: Diagnosis not present

## 2024-03-06 NOTE — Therapy (Signed)
 OUTPATIENT PHYSICAL THERAPY  EVALUATION   Patient Name: Donna [name] MRN: [id number] DOB:[birth year], 73 y.o., female Today's Date: 03/06/2024  END OF SESSION:  PT End of Session - 03/06/24 0834     Visit Number 1    Number of Visits 6    Date for Recertification  04/17/24    Authorization Type [employer] $10 copay    Progress Note Due on Visit 10    PT Start Time 0801    PT Stop Time 0833    PT Time Calculation (min) 32 min    Activity Tolerance Patient tolerated treatment well    Behavior During Therapy [facility] for tasks assessed/performed           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   chest - CX3 + 5FU   Squamous cell carcinoma of skin 09/27/2014   left thigh - tx p bx   Substance abuse (HCC)    Past Surgical History:  Procedure Laterality Date   ANKLE SURGERY     Bilateral    CATARACT EXTRACTION Bilateral    CERVICAL SPINE SURGERY  07/21/2021   LAPAROSCOPIC APPENDECTOMY N/A 05/11/2023   Procedure: APPENDECTOMY LAPAROSCOPIC;  Surgeon: Sebastian Moles, MD;  Location: [facility] OR;  Service: General;  Laterality: N/A;   TUBAL LIGATION     Patient Active Problem List   Diagnosis Date Noted   Family history of colon cancer 01/20/2024   Exposure to hepatitis B 07/01/2023   Need for immunization against viral hepatitis 07/01/2023   Screening for STDs (sexually transmitted diseases) 07/01/2023   History of substance use  07/01/2023   S/P appendectomy 05/11/2023   S/P laparoscopic appendectomy 05/11/2023   Gasping for breath 08/19/2021   PTSD (post-traumatic stress disorder) 08/19/2021   Parasomnia overlap disorder 08/19/2021   Snoring 08/19/2021   Excessive daytime sleepiness 08/19/2021   Sleep paralysis, recurrent isolated 08/19/2021   Asthma 02/19/2021   Multiple drug allergies 02/19/2021   Gluten intolerance 02/19/2021   Chronic rhinitis 02/19/2021   Metatarsalgia of left foot 11/14/2020   Achilles tendinosis of right lower extremity 11/14/2020   Essential tremor 07/04/2019   DOE (dyspnea on exertion) 01/20/2019   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 06/13/2018   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   Superficial basal cell carcinoma 06/13/2018   Upper airway cough syndrome 06/13/2018   Atrophic vaginitis 02/17/2018   Bilateral leg cramps 02/17/2018   Fatty liver, alcoholic 04/22/2016   Depression 04/19/2016   Withdrawal complaint 04/19/2016   Hx of adenomatous colonic polyps 08/03/2015   Herpes simplex 06/15/2014   GERD (gastroesophageal reflux disease) 10/14/2013   Anxiety state 10/14/2013   Recovering alcoholic in remission (HCC) 10/14/2013   Allergic rhinitis 10/14/2013   Adjustment disorder with anxiety 10/14/2013   Elevated low-density lipoprotein  level 10/14/2013   Essential hypertension 10/15/2012   Heart murmur 05/25/2012    PCP: Gerome Brunet DO  REFERRING PROVIDER: Burnetta Brunet, DO  REFERRING DIAG: [phone] (ICD-10-CM) - It band syndrome, left M25.552 (ICD-10-CM) - Pain of left hip M62.81 (ICD-10-CM) - Weakness of left quadriceps muscle  THERAPY DIAG:  Pain of left hip - Plan: PT plan of care cert/re-cert  Pain in left leg - Plan: PT plan of care cert/re-cert  Muscle weakness (generalized) - Plan: PT plan of care cert/re-cert  Difficulty in walking, not elsewhere classified - Plan: PT plan of care cert/re-cert  Other  symptoms and signs involving the musculoskeletal system - Plan: PT plan of care cert/re-cert  Rationale for Evaluation and Treatment: Rehabilitation  ONSET DATE: July 2025  SUBJECTIVE:   SUBJECTIVE STATEMENT: Pt reports return of symptoms in July this year.  She reports symptoms worsened after abdominal injury (likely strain).  She continues to have pain.  She also has IBS so it's hard to tell what is causing the pain.  She has not been doing any exercises or yoga due to the pain.   Had trouble with leg press in past.   Pt indicated seeing PT prior to appendectomy.   PERTINENT HISTORY: Med Hx: Anxiety, history of carcinoma, asthma, Depression, GERD, Hyperlipidemia, IBS, substance abuse per chart review.   Pt reported some cognitive decline by per patient.   PAIN:  NPRS scale: currently 2, up to 10, at best 0/10 Pain location: Lt hip/thigh, Lt obliques Pain description: sharp Aggravating factors: worse stretching or bending over Relieving factors: rest, not moving  PRECAUTIONS: None  WEIGHT BEARING RESTRICTIONS: No  FALLS:  Has patient fallen in last 6 months? No  LIVING ENVIRONMENT: Lives in: House/apartment Stairs: not at home   OCCUPATION: Yoga instructor  PLOF: Independent, walking, hiking, yoga, biking.  Has gym  PATIENT GOALS: Reduce pain, get back to activity, get stronger.    OBJECTIVE:   PATIENT SURVEYS:  Patient-Specific Activity Scoring Scheme  0 represents "unable to perform." 10 represents "able to perform at prior level. 0 1 2 3 4 5 6 7 8 9  10 (Date and Score)   Activity Eval     1. Yoga 4     2. Going to the gym 0    3. Lifting 6   4. Twisting 6   Score 4    Total score = sum of the activity scores/number of activities Minimum detectable change (90%CI) for average score = 2 points Minimum detectable change (90%CI) for single activity score = 3 points     COGNITION: Overall cognitive status: WFL    SENSATION: WFL   POSTURE:   No Significant postural limitations  PALPATION: 03/06/24 Significant tenderness on Lt at last rib anteriorly   LOWER EXTREMITY MMT:  MMT Right 03/06/24 Left 03/06/24  Hip flexion 5/5 3/5  Hip extension 3/5 3/5  Hip abduction 4/5 3/5  Hip internal rotation 5/5 4/5  Hip external rotation 4/5 3/5   (Blank rows = not tested)   GAIT: 03/06/24 Independent ambulation in community.  TODAY'S TREATMENT 03/06/2024 See HEP - trial reps performed with trial of various exercises to see what is more helpful.  See below for details Also discussed gym exercises, allow to return to gym with slight modifications as pain has improved; discussed ways to modify and decrease aggravating symptoms.   PATIENT EDUCATION:  03/06/2024 Education details: HEP, POC Person educated: Patient Education method: Programmer, Multimedia, Demonstration, Verbal cues, and Handouts Education comprehension: verbalized understanding, returned demonstration, and verbal cues required  HOME EXERCISE PROGRAM: Access Code: [email or web address] URL: [email or web address] Date: 03/06/2024 Prepared by: Corean Ku  Exercises - Cobra  - 1 x daily - 7 x weekly - 2 sets - 10 reps - Standing Side Bending with PLB  - 1 x daily - 7 x weekly - 2 sets - 10 reps  ASSESSMENT:  CLINICAL IMPRESSION: Patient is a 73 y.o. who comes to clinic with complaints of Lt hip/leg pain as well as Lt abdominal pain.  She demonstrates decreased strength,balance and movement coordination deficits that impair their ability to perform usual daily and recreational functional activities without increase difficulty/symptoms at this time.  Patient to benefit from skilled PT services to address impairments and limitations to improve to previous level of function without restriction  secondary to condition.   OBJECTIVE IMPAIRMENTS: decreased activity tolerance, decreased balance, decreased coordination, decreased endurance, decreased mobility, difficulty walking, decreased ROM, decreased strength, increased fascial restrictions, impaired perceived functional ability, increased muscle spasms, impaired flexibility, impaired tone, improper body mechanics, and pain.   ACTIVITY LIMITATIONS: carrying, lifting, bending, sitting, standing, squatting, sleeping, stairs, transfers, and locomotion level  PARTICIPATION LIMITATIONS: meal prep, cleaning, interpersonal relationship, driving, shopping, community activity, and occupation  PERSONAL FACTORS: Age, Past/current experiences, Time since onset of injury/illness/exacerbation, and 3+ comorbidities: Anxiety, history of carcinoma, asthma, Depgression, GERD, Hyperlipidemia, IBS, substance abuse  are also affecting patient's functional outcome.   REHAB POTENTIAL: Good  CLINICAL DECISION MAKING: Stable/uncomplicated  EVALUATION COMPLEXITY: Low   GOALS: Goals reviewed with patient? Yes  SHORT TERM GOALS: (Target Date: 03/27/2024)   1.  Patient will demonstrate independent use of home exercise program to maintain progress from in clinic treatments. Goal status: New  LONG TERM GOALS: (Target Date: 04/17/2024)   1. Patient will demonstrate/report pain at worst less than or equal to 2/10 to facilitate minimal limitation in daily activity secondary to pain symptoms. Goal status: New   2. Patient will demonstrate independent use of home exercise program to facilitate ability to maintain/progress functional gains from skilled physical therapy services. Goal status: New   3. Patient will improve PSFS by at least 2 points to indicate reduced disability due to condition. Goal status: New   4.  Patient will demonstrate bilateral  LE MMT 5/5 throughout to faciltiate usual transfers, stairs, squatting at [facility] for daily life.  Goal status:  New   5.  Patient will demonstrate/report ability to return to YOGA instruction and gym workouts at [facility].  Goal status: New    PLAN:  PT FREQUENCY: 1x/week  PT DURATION: 6 weeks  PLANNED INTERVENTIONS: Can include 02853- PT Re-evaluation, 97110-Therapeutic exercises, 97530- Therapeutic activity, [id number]- Neuromuscular re-education, 97535- Self Care, 97140- Manual therapy, [phone]- Gait training, [phone]- Orthotic Fit/training, [phone]- Canalith repositioning, [id number]- Aquatic Therapy, 97014- Electrical stimulation (unattended), [id number]- Electrical stimulation (manual), [id number] Physical performance testing, 97016- Vasopneumatic device, [id number]- Ultrasound, [id number]- Traction (mechanical), [id number]- Ionotophoresis 4mg /ml Dexamethasone, Patient/Family education, Balance training, Stair training, Taping, Dry Needling, Joint mobilization, Joint manipulation, Spinal manipulation, Spinal mobilization, Scar mobilization, Vestibular training, Visual/preceptual remediation/compensation, DME instructions,  Cryotherapy, and Moist heat.  All performed as medically necessary.  All included unless contraindicated  PLAN FOR NEXT SESSION: Review HEP, possible DN,   Progressive hip strengthening/balance improvements.  Incorporation of some gym based activity as tolerated.      Corean JULIANNA Ku, PT, DPT 03/06/24 9:24 AM

## 2024-03-08 ENCOUNTER — Ambulatory Visit: Admitting: Physician Assistant

## 2024-03-10 DIAGNOSIS — R222 Localized swelling, mass and lump, trunk: Secondary | ICD-10-CM | POA: Diagnosis not present

## 2024-03-13 ENCOUNTER — Encounter: Payer: Self-pay | Admitting: Radiology

## 2024-03-15 ENCOUNTER — Encounter: Payer: Self-pay | Admitting: Physical Therapy

## 2024-03-15 ENCOUNTER — Ambulatory Visit: Admitting: Physical Therapy

## 2024-03-15 DIAGNOSIS — M79605 Pain in left leg: Secondary | ICD-10-CM

## 2024-03-15 DIAGNOSIS — R29898 Other symptoms and signs involving the musculoskeletal system: Secondary | ICD-10-CM

## 2024-03-15 DIAGNOSIS — M25552 Pain in left hip: Secondary | ICD-10-CM | POA: Diagnosis not present

## 2024-03-15 DIAGNOSIS — R262 Difficulty in walking, not elsewhere classified: Secondary | ICD-10-CM | POA: Diagnosis not present

## 2024-03-15 DIAGNOSIS — M6281 Muscle weakness (generalized): Secondary | ICD-10-CM

## 2024-03-15 NOTE — Therapy (Signed)
 OUTPATIENT PHYSICAL THERAPY TREATMENT   Patient Name: Donna [name] MRN: [id number] DOB:Dec 21, 1950, 73 y.o., female Today's Date: 03/15/2024  END OF SESSION:  PT End of Session - 03/15/24 0804     Visit Number 2    Number of Visits 6    Date for Recertification  04/17/24    Authorization Type [employer] Medicare $10 copay    Progress Note Due on Visit 10    PT Start Time 0801    PT Stop Time 0830    PT Time Calculation (min) 29 min    Activity Tolerance Patient tolerated treatment well    Behavior During Therapy [facility] for tasks assessed/performed            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   chest - CX3 + 5FU   Squamous cell carcinoma of skin 09/27/2014   left thigh - tx p bx   Substance abuse (HCC)    Past Surgical History:  Procedure Laterality Date   ANKLE SURGERY     Bilateral    CATARACT EXTRACTION Bilateral    CERVICAL SPINE SURGERY  07/21/2021   LAPAROSCOPIC APPENDECTOMY N/A 05/11/2023   Procedure: APPENDECTOMY LAPAROSCOPIC;  Surgeon: Sebastian Moles, MD;  Location: [facility] OR;  Service: General;  Laterality: N/A;   TUBAL LIGATION     Patient Active Problem List   Diagnosis Date Noted   Family history of colon cancer 01/20/2024   Exposure to hepatitis B 07/01/2023   Need for immunization against viral hepatitis 07/01/2023   Screening for STDs (sexually transmitted diseases) 07/01/2023   History of substance use  07/01/2023   S/P appendectomy 05/11/2023   S/P laparoscopic appendectomy 05/11/2023   Gasping for breath 08/19/2021   PTSD (post-traumatic stress disorder) 08/19/2021   Parasomnia overlap disorder 08/19/2021   Snoring 08/19/2021   Excessive daytime sleepiness 08/19/2021   Sleep paralysis, recurrent isolated 08/19/2021   Asthma 02/19/2021   Multiple drug allergies 02/19/2021   Gluten intolerance 02/19/2021   Chronic rhinitis 02/19/2021   Metatarsalgia of left foot 11/14/2020   Achilles tendinosis of right lower extremity 11/14/2020   Essential tremor 07/04/2019   DOE (dyspnea on exertion) 01/20/2019   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 06/13/2018   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   Superficial basal cell carcinoma 06/13/2018   Upper airway cough syndrome 06/13/2018   Atrophic vaginitis 02/17/2018   Bilateral leg cramps 02/17/2018   Fatty liver, alcoholic 04/22/2016   Depression 04/19/2016   Withdrawal complaint 04/19/2016   Hx of adenomatous colonic polyps 08/03/2015   Herpes simplex 06/15/2014   GERD (gastroesophageal reflux disease) 10/14/2013   Anxiety state 10/14/2013   Recovering alcoholic in remission (HCC) 10/14/2013   Allergic rhinitis 10/14/2013   Adjustment disorder with anxiety 10/14/2013   Elevated low-density lipoprotein  level 10/14/2013   Essential hypertension 10/15/2012   Heart murmur 05/25/2012    PCP: Gerome Brunet DO  REFERRING PROVIDER: Burnetta Brunet, DO  REFERRING DIAG: [phone] (ICD-10-CM) - It band syndrome, left M25.552 (ICD-10-CM) - Pain of left hip M62.81 (ICD-10-CM) - Weakness of left quadriceps muscle  THERAPY DIAG:  Pain of left hip  Pain in left leg  Muscle weakness (generalized)  Difficulty in walking, not elsewhere classified  Other symptoms and signs involving the musculoskeletal system  Rationale for Evaluation and Treatment: Rehabilitation  ONSET DATE: July  2025  SUBJECTIVE:   SUBJECTIVE STATEMENT: Lt side feels much better; has returned to the gym doing light strengthening as she eases back into it.  Did two yoga classes - no issues.    Pt indicated seeing PT prior to appendectomy.   PERTINENT HISTORY: Med Hx: Anxiety, history of carcinoma, asthma, Depression, GERD, Hyperlipidemia, IBS, substance abuse per chart review.   Pt reported some cognitive decline by per patient.   PAIN:  NPRS scale: currently 2, up to 10, at best 0/10 Pain location: Lt hip/thigh, Lt obliques Pain description: sharp Aggravating factors: worse stretching or bending over Relieving factors: rest, not moving  PRECAUTIONS: None  WEIGHT BEARING RESTRICTIONS: No  FALLS:  Has patient fallen in last 6 months? No  LIVING ENVIRONMENT: Lives in: House/apartment Stairs: not at home   OCCUPATION: Yoga instructor  PLOF: Independent, walking, hiking, yoga, biking.  Has gym  PATIENT GOALS: Reduce pain, get back to activity, get stronger.    OBJECTIVE:   PATIENT SURVEYS:  Patient-Specific Activity Scoring Scheme  0 represents "unable to perform." 10 represents "able to perform at prior level. 0 1 2 3 4 5 6 7 8 9  10 (Date and Score)   Activity Eval     1. Yoga 4     2. Going to the gym 0    3. Lifting 6   4. Twisting 6   Score 4    Total score = sum of the activity scores/number of activities Minimum detectable change (90%CI) for average score = 2 points Minimum detectable change (90%CI) for single activity score = 3 points     COGNITION: Overall cognitive status: WFL    SENSATION: WFL   POSTURE:  No Significant postural limitations  PALPATION: 03/06/24 Significant tenderness on Lt at last rib anteriorly   LOWER EXTREMITY MMT:  MMT Right 03/06/24 Left 03/06/24  Hip flexion 5/5 3/5  Hip extension 3/5 3/5  Hip abduction 4/5 3/5  Hip internal rotation 5/5 4/5  Hip external rotation 4/5 3/5   (Blank rows = not  tested)   GAIT: 03/06/24 Independent ambulation in community.  TODAY'S TREATMENT 03/15/24 TherAct Lateral lunge with TRX x 10 reps bil Curtsy lunges with TRX x 10 reps bil Reverse lunges with TRX x 10 reps bil Seated LAQ with L5 band x10 reps bil RDL with 15# KB x 10 reps Review of other gym exercises - discussed benefits of various lower body exercises   03/06/2024 See HEP - trial reps performed with trial of various exercises to see what is more helpful.  See below for details Also discussed gym exercises, allow to return to gym with slight modifications as pain has improved; discussed ways to modify and decrease aggravating symptoms.   PATIENT EDUCATION:  03/06/2024 Education details: HEP, POC Person educated: Patient Education method: Programmer, Multimedia, Demonstration, Verbal cues, and Handouts Education comprehension: verbalized understanding, returned demonstration, and verbal cues required  HOME EXERCISE PROGRAM: Access Code: [email or web address] URL: [email or web address] Date: 03/15/2024 Prepared by: Corean Ku  Exercises - Cobra  - 1 x daily - 7 x weekly - 2 sets - 10 reps - Standing Side Bending with PLB  - 1 x daily - 7 x weekly - 2 sets - 10 reps - Lateral Lunge with TRX  - 1 x daily - 7 x weekly - 2-3 sets - 10 reps - Curtsy Lunge with TRX  - 1 x daily - 7 x weekly - 2-3 sets - 10 reps - Assisted Lunge with TRX  - 1 x daily - 7 x weekly - 2-3 sets - 10 reps - Seated Knee Extension with Anchored Resistance  - 1 x daily - 7 x weekly - 2-3 sets - 10 reps - Kettlebell Deadlift  - 1 x daily - 7 x weekly - 2-3 sets - 10 reps  ASSESSMENT:  CLINICAL IMPRESSION: Pt overall returns to PT with improvement in Lt sided pain, no pain reported and tenderness resolved.  Began incorporating strength  training exercises as well. Anticipate she will be ready to d/c next visit.  OBJECTIVE IMPAIRMENTS: decreased activity tolerance, decreased balance, decreased coordination, decreased endurance, decreased mobility, difficulty walking, decreased ROM, decreased strength, increased fascial restrictions, impaired perceived functional ability, increased muscle spasms, impaired flexibility, impaired tone, improper body mechanics, and pain.   ACTIVITY LIMITATIONS: carrying, lifting, bending, sitting, standing, squatting, sleeping, stairs, transfers, and locomotion level  PARTICIPATION LIMITATIONS: meal prep, cleaning, interpersonal relationship, driving, shopping, community activity, and occupation  PERSONAL FACTORS: Age, Past/current experiences, Time since onset of injury/illness/exacerbation, and 3+ comorbidities: Anxiety, history of carcinoma, asthma, Depgression, GERD, Hyperlipidemia, IBS, substance abuse  are also affecting patient's functional outcome.   REHAB POTENTIAL: Good  CLINICAL DECISION MAKING: Stable/uncomplicated  EVALUATION COMPLEXITY: Low   GOALS: Goals reviewed with patient? Yes  SHORT TERM GOALS: (Target Date: 03/27/2024)   1.  Patient will demonstrate independent use of home exercise program to maintain progress from in clinic treatments. Goal status: MET 03/15/24  LONG TERM GOALS: (Target Date: 04/17/2024)   1. Patient will demonstrate/report pain at worst less than or equal to 2/10 to facilitate minimal limitation in daily activity secondary to pain symptoms. Goal status: New   2. Patient will demonstrate independent use of home exercise program to facilitate ability to maintain/progress functional gains from skilled physical therapy services. Goal status: New   3. Patient will improve PSFS by at least 2 points to indicate reduced disability due to condition. Goal status: New   4.  Patient will demonstrate bilateral  LE MMT 5/5 throughout to faciltiate usual  transfers, stairs, squatting at [facility] for daily life.  Goal status: New  5.  Patient will demonstrate/report ability to return to YOGA instruction and gym workouts at [facility].  Goal status: New    PLAN:  PT FREQUENCY: 1x/week  PT DURATION: 6 weeks  PLANNED INTERVENTIONS: Can include 02853- PT Re-evaluation, 97110-Therapeutic exercises, 97530- Therapeutic activity, [id number]- Neuromuscular re-education, 97535- Self Care, 97140- Manual therapy, [phone]- Gait training, [phone]- Orthotic Fit/training, [phone]- Canalith repositioning, [id number]- Aquatic Therapy, 97014- Electrical stimulation (unattended), [id number]- Electrical stimulation (manual), [id number] Physical performance testing, 97016- Vasopneumatic device, [id number]- Ultrasound, [id number]- Traction (mechanical), [id number]- Ionotophoresis 4mg /ml Dexamethasone, Patient/Family education, Balance training, Stair training, Taping, Dry Needling, Joint mobilization, Joint manipulation, Spinal manipulation, Spinal mobilization, Scar mobilization, Vestibular training, Visual/preceptual remediation/compensation, DME instructions, Cryotherapy, and Moist heat.  All performed as medically necessary.  All included unless contraindicated  PLAN FOR NEXT SESSION: possible d/c or hold if still doing well      Corean JULIANNA Ku, PT, DPT 03/15/24 8:33 AM

## 2024-03-16 DIAGNOSIS — R935 Abnormal findings on diagnostic imaging of other abdominal regions, including retroperitoneum: Secondary | ICD-10-CM | POA: Diagnosis not present

## 2024-03-16 DIAGNOSIS — F418 Other specified anxiety disorders: Secondary | ICD-10-CM | POA: Diagnosis not present

## 2024-03-16 DIAGNOSIS — R1085 Abdominal pain of multiple sites: Secondary | ICD-10-CM | POA: Diagnosis not present

## 2024-03-16 DIAGNOSIS — Z85828 Personal history of other malignant neoplasm of skin: Secondary | ICD-10-CM | POA: Diagnosis not present

## 2024-03-17 ENCOUNTER — Other Ambulatory Visit: Payer: Self-pay | Admitting: Family Medicine

## 2024-03-17 DIAGNOSIS — F418 Other specified anxiety disorders: Secondary | ICD-10-CM

## 2024-03-17 DIAGNOSIS — R935 Abnormal findings on diagnostic imaging of other abdominal regions, including retroperitoneum: Secondary | ICD-10-CM

## 2024-03-17 DIAGNOSIS — Z85828 Personal history of other malignant neoplasm of skin: Secondary | ICD-10-CM

## 2024-03-17 DIAGNOSIS — R1085 Abdominal pain of multiple sites: Secondary | ICD-10-CM

## 2024-03-22 ENCOUNTER — Encounter: Payer: Self-pay | Admitting: Physical Therapy

## 2024-03-22 ENCOUNTER — Ambulatory Visit: Admitting: Physical Therapy

## 2024-03-22 DIAGNOSIS — M79605 Pain in left leg: Secondary | ICD-10-CM

## 2024-03-22 DIAGNOSIS — M25552 Pain in left hip: Secondary | ICD-10-CM

## 2024-03-22 DIAGNOSIS — M6281 Muscle weakness (generalized): Secondary | ICD-10-CM

## 2024-03-22 DIAGNOSIS — R29898 Other symptoms and signs involving the musculoskeletal system: Secondary | ICD-10-CM

## 2024-03-22 DIAGNOSIS — R262 Difficulty in walking, not elsewhere classified: Secondary | ICD-10-CM | POA: Diagnosis not present

## 2024-03-22 NOTE — Therapy (Signed)
 OUTPATIENT PHYSICAL THERAPY TREATMENT   Patient Name: CORINE SOLORIO MRN: [id number] DOB:1950-08-13, 73 y.o., female Today's Date: 03/22/2024  END OF SESSION:  PT End of Session - 03/22/24 0758     Visit Number 3    Number of Visits 6    Date for Recertification  04/17/24    Authorization Type [employer] $10 copay    Progress Note Due on Visit 10    PT Start Time 0800    PT Stop Time 0825    PT Time Calculation (min) 25 min    Activity Tolerance Patient tolerated treatment well    Behavior During Therapy [facility] for tasks assessed/performed             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 OR;  Service: General;  Laterality: N/A;   TUBAL LIGATION     Patient Active Problem List   Diagnosis Date Noted   Family history of colon cancer 01/20/2024   Exposure to hepatitis B 07/01/2023   Need for immunization against viral hepatitis 07/01/2023   Screening for STDs (sexually transmitted diseases) 07/01/2023   History of substance use  07/01/2023   S/P appendectomy 05/11/2023   S/P laparoscopic appendectomy 05/11/2023   Gasping for breath 08/19/2021   PTSD (post-traumatic stress disorder) 08/19/2021   Parasomnia overlap disorder 08/19/2021   Snoring 08/19/2021   Excessive daytime sleepiness 08/19/2021   Sleep paralysis, recurrent isolated 08/19/2021   Asthma 02/19/2021   Multiple drug allergies 02/19/2021   Gluten intolerance 02/19/2021   Chronic rhinitis 02/19/2021   Metatarsalgia of left foot 11/14/2020   Achilles tendinosis of right lower extremity 11/14/2020   Essential tremor 07/04/2019   DOE (dyspnea on exertion) 01/20/2019   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 06/13/2018   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   Superficial basal cell carcinoma 06/13/2018   Upper airway cough syndrome 06/13/2018   Atrophic vaginitis 02/17/2018   Bilateral leg cramps 02/17/2018   Fatty liver, alcoholic 04/22/2016   Depression 04/19/2016   Withdrawal complaint 04/19/2016   Hx of adenomatous colonic polyps 08/03/2015   Herpes simplex 06/15/2014   GERD (gastroesophageal reflux disease) 10/14/2013   Anxiety state 10/14/2013   Recovering alcoholic in remission (HCC) 10/14/2013   Allergic rhinitis 10/14/2013   Adjustment disorder with anxiety 10/14/2013   Elevated low-density  lipoprotein level 10/14/2013   Essential hypertension 10/15/2012   Heart murmur 05/25/2012    PCP: Gerome Brunet DO  REFERRING PROVIDER: Burnetta Brunet, DO  REFERRING DIAG: [phone] (ICD-10-CM) - It band syndrome, left M25.552 (ICD-10-CM) - Pain of left hip M62.81 (ICD-10-CM) - Weakness of left quadriceps muscle  THERAPY DIAG:  Pain of left hip  Pain in left leg  Muscle weakness (generalized)  Difficulty in walking, not elsewhere classified  Other symptoms and signs involving the musculoskeletal system  Rationale for Evaluation and Treatment: Rehabilitation  ONSET DATE: July  2025  SUBJECTIVE:   SUBJECTIVE STATEMENT: Lt side is doing well, no complaints.  Yesterday she bent over to get something out of a low cabinet and Rt side felt like something going into my stomach and I've had pain since.   Pt indicated seeing PT prior to appendectomy.   PERTINENT HISTORY: Med Hx: Anxiety, history of carcinoma, asthma, Depression, GERD, Hyperlipidemia, IBS, substance abuse per chart review.   Pt reported some cognitive decline by per patient.   PAIN:  NPRS scale: currently 2, up to 10, at best 0/10 Pain location: Lt hip/thigh, Lt obliques Pain description: sharp Aggravating factors: worse stretching or bending over Relieving factors: rest, not moving  PRECAUTIONS: None  WEIGHT BEARING RESTRICTIONS: No  FALLS:  Has patient fallen in last 6 months? No  LIVING ENVIRONMENT: Lives in: House/apartment Stairs: not at home   OCCUPATION: Yoga instructor  PLOF: Independent, walking, hiking, yoga, biking.  Has gym  PATIENT GOALS: Reduce pain, get back to activity, get stronger.    OBJECTIVE:   PATIENT SURVEYS:  Patient-Specific Activity Scoring Scheme  0 represents "unable to perform." 10 represents "able to perform at prior level. 0 1 2 3 4 5 6 7 8 9  10 (Date and Score)   Activity Eval  03/22/24   1. Yoga 4   8  2. Going to the gym 0  6  3. Lifting 6 8  4. Twisting 6 10  Score 4 8   Total score = sum of the activity scores/number of activities Minimum detectable change (90%CI) for average score = 2 points Minimum detectable change (90%CI) for single activity score = 3 points     COGNITION: Overall cognitive status: WFL    SENSATION: WFL   POSTURE:  No Significant postural limitations  PALPATION: 03/06/24 Significant tenderness on Lt at last rib anteriorly   LOWER EXTREMITY MMT:  MMT Right 03/06/24 Left 03/06/24 Right/Left 03/22/24  Hip flexion 5/5 3/5 /5  Hip extension 3/5 3/5 3/3  Hip abduction 4/5 3/5 5/5  Hip  internal rotation 5/5 4/5 /5  Hip external rotation 4/5 3/5 4+/4+   (Blank rows = not tested)   GAIT: 03/06/24 Independent ambulation in community.  TODAY'S TREATMENT 03/22/24 Assessment of Rt abdomen, pain with resisted hip flexion and tender to palpation; added to HEP with trial of various stretches to help - reverse warrior most effective.  Did add additional light core exercises as well.  Pt to follow up with physician if symptoms worsen or do not improve, but since they seem to be improving will see how she responds.    Goal assessment - see above and below for details  Discussed current progress and POC - pt in agreement  03/15/24 TherAct Lateral lunge with TRX x 10 reps bil Curtsy lunges with TRX x 10 reps bil Reverse lunges with TRX x 10 reps bil Seated LAQ with L5 band x10 reps bil RDL with 15# KB x 10 reps Review of other gym exercises - discussed benefits of various lower body exercises   03/06/2024 See HEP - trial reps performed with trial of various exercises to see what is more helpful.  See below for details Also discussed gym exercises, allow to return to gym with slight modifications as pain has improved; discussed ways to modify and decrease aggravating symptoms.   PATIENT EDUCATION:  03/06/2024 Education details: HEP, POC Person educated: Patient Education method: Programmer, Multimedia, Demonstration, Verbal cues, and Handouts Education comprehension: verbalized understanding, returned demonstration, and verbal cues required  HOME EXERCISE PROGRAM: Access Code: [email or web address] URL: [email or web address] Date: 03/15/2024 Prepared by: Corean Ku  Exercises - Cobra  - 1 x daily - 7 x weekly - 2 sets - 10 reps - Standing Side Bending with PLB  - 1 x daily - 7 x weekly - 2 sets - 10  reps - Lateral Lunge with TRX  - 1 x daily - 7 x weekly - 2-3 sets - 10 reps - Curtsy Lunge with TRX  - 1 x daily - 7 x weekly - 2-3 sets - 10 reps - Assisted Lunge with TRX  - 1 x daily - 7 x weekly - 2-3 sets - 10 reps - Seated Knee Extension with Anchored Resistance  - 1 x daily - 7 x weekly - 2-3 sets - 10 reps - Kettlebell Deadlift  - 1 x daily - 7 x weekly - 2-3 sets - 10 reps  ASSESSMENT:  CLINICAL IMPRESSION: Pt has met all goals except still has some areas of decreased strength - which can be address with HEP, gym and yoga program.  Advised to follow up with physician if Rt side does not improve or symptoms get worse, and pt in agreement.  Agreeable to hold PT at this time.    OBJECTIVE IMPAIRMENTS: decreased activity tolerance, decreased balance, decreased coordination, decreased endurance, decreased mobility, difficulty walking, decreased ROM, decreased strength, increased fascial restrictions, impaired perceived functional ability, increased muscle spasms, impaired flexibility, impaired tone, improper body mechanics, and pain.   ACTIVITY LIMITATIONS: carrying, lifting, bending, sitting, standing, squatting, sleeping, stairs, transfers, and locomotion level  PARTICIPATION LIMITATIONS: meal prep, cleaning, interpersonal relationship, driving, shopping, community activity, and occupation  PERSONAL FACTORS: Age, Past/current experiences, Time since onset of injury/illness/exacerbation, and 3+ comorbidities: Anxiety, history of carcinoma, asthma, Depgression, GERD, Hyperlipidemia, IBS, substance abuse  are also affecting patient's functional outcome.   REHAB POTENTIAL: Good  CLINICAL DECISION MAKING: Stable/uncomplicated  EVALUATION COMPLEXITY: Low   GOALS: Goals reviewed with patient? Yes  SHORT TERM GOALS: (Target Date: 03/27/2024)   1.  Patient will demonstrate independent use of home exercise program to maintain progress from in clinic treatments. Goal status: MET  03/15/24  LONG TERM GOALS: (Target Date:  04/17/2024)   1. Patient will demonstrate/report pain at worst less than or equal to 2/10 to facilitate minimal limitation in daily activity secondary to pain symptoms. Goal status: MET 03/22/24   2. Patient will demonstrate independent use of home exercise program to facilitate ability to maintain/progress functional gains from skilled physical therapy services. Goal status: MET 03/22/24   3. Patient will improve PSFS by at least 2 points to indicate reduced disability due to condition. Goal status: MET 03/22/24   4.  Patient will demonstrate bilateral  LE MMT 5/5 throughout to faciltiate usual transfers, stairs, squatting at [facility] for daily life.  Goal status: PARTIALLY MET 03/22/24   5.  Patient will demonstrate/report ability to return to YOGA instruction and gym workouts at [employer]  Goal status: MET 03/22/24    PLAN:  PT FREQUENCY: 1x/week  PT DURATION: 6 weeks  PLANNED INTERVENTIONS: Can include 02853- PT Re-evaluation, 97110-Therapeutic exercises, 97530- Therapeutic activity, 97112- Neuromuscular re-education, 97535- Self Care, 97140- Manual therapy, [phone]- Gait training, [phone]- Orthotic Fit/training, [phone]- Canalith repositioning, [id number]- Aquatic Therapy, 97014- Electrical stimulation (unattended), [id number]- Electrical stimulation (manual), [id number] Physical performance testing, 97016- Vasopneumatic device, [id number]- Ultrasound, [id number]- Traction (mechanical), [id number]- Ionotophoresis 4mg /ml Dexamethasone, Patient/Family education, Balance training, Stair training, Taping, Dry Needling, Joint mobilization, Joint manipulation, Spinal manipulation, Spinal mobilization, Scar mobilization, Vestibular training, Visual/preceptual remediation/compensation, DME instructions, Cryotherapy, and Moist heat.  All performed as medically necessary.  All included unless contraindicated  PLAN FOR NEXT SESSION: hold PT x 30 days     Corean JULIANNA Ku, PT, DPT 03/22/24  8:29 AM

## 2024-03-27 ENCOUNTER — Encounter: Payer: Self-pay | Admitting: Sports Medicine

## 2024-03-27 ENCOUNTER — Ambulatory Visit: Admitting: Sports Medicine

## 2024-03-27 DIAGNOSIS — R1032 Left lower quadrant pain: Secondary | ICD-10-CM

## 2024-03-27 DIAGNOSIS — M6281 Muscle weakness (generalized): Secondary | ICD-10-CM

## 2024-03-27 DIAGNOSIS — M7632 Iliotibial band syndrome, left leg: Secondary | ICD-10-CM

## 2024-03-27 NOTE — Progress Notes (Signed)
 Patient says that her hip/IT band and abdomen all feel great. She has been to PT for home exercises, and has been discharged as she is comfortable to continue on her own. She denies any pain with her leg or with her abdomen.

## 2024-03-27 NOTE — Progress Notes (Signed)
 Donna [name] - 73 y.o. female MRN [id number]  Date of birth: 12/12/1950  Office Visit Note: Visit Date: 03/27/2024 PCP: Gerome Brunet, DO Referred by: Gerome Brunet, DO  Subjective: Chief Complaint  Patient presents with   Left Leg - Follow-up   HPI: Donna [name] is a pleasant 73 y.o. female who presents today for follow-up of left lateral hip/IT band, as well as abdominal mass/discomfort.  Left hip/ITB: [name] is doing excellent, she responded very well to the treatment of extracorporeal shockwave therapy.  She has continued IT band stretching.  She has been strengthening her quadricep and pelvic musculature in physical therapy, she was discharged as she is doing so well on 03/22/2024.  She continues doing her home exercises.  She is going to the gym 3 times weekly performing light weights. Feeling good.  Abdomen: Abdomen is doing well to, not having any discomfort.  She did see Dr. Vicenta Poli on 03/10/2024 (note reviewed today), he states there was no palpable mass, no soft tissue lipoma.  He wanted to follow-up back with her in a month to monitor, has this appointment upcoming in early December.  Pertinent ROS were reviewed with the patient and found to be negative unless otherwise specified above in HPI.   Assessment & Plan: Visit Diagnoses:  1. It band syndrome, left   2. Weakness of left quadriceps muscle   3. LLQ abdominal pain    Plan: Impression is left hip and leg pain with IT band syndrome which responded very well to extracorporeal shockwave therapy as well as formalized physical therapy.  She has strengthen her quadricep and pelvic musculature and PT and is now transition to home exercises.  We discussed continuing both her stretching and her at home.  She also will continue going to the gym 3 times weekly working on anaerobic strength training.  She will continue follow-up with Dr. Vicenta Poli for her abdominal/subcutaneous atrophy/lesion to monitor,  appointment upcoming in December.  She may use her meloxicam  15 mg or Tylenol  only as needed at this point from pain.  We will see each other back in as-needed basis.  Follow-up: Return if symptoms worsen or fail to improve.   Meds & Orders: No orders of the defined types were placed in this encounter.  No orders of the defined types were placed in this encounter.   Procedures: N/a     Clinical History: No specialty comments available.  She reports that she quit smoking about 27 years ago. Her smoking use included cigarettes. She started smoking about 57 years ago. She has never used smokeless tobacco. No results for input(s): HGBA1C, LABURIC in the last 8760 hours.  Objective:    Physical Exam  Gen: Well-appearing, in no acute distress; non-toxic CV: Well-perfused. Warm.  Resp: Breathing unlabored on room air; no wheezing. Psych: Fluid speech in conversation; appropriate affect; normal thought process  Ortho Exam - Hip/ITB: No GT tenderness. Neutral gait, no Trendelenburg.  Imaging: No results found.  Past Medical/Family/Surgical/Social History: Medications & Allergies reviewed per EMR, new medications updated. Patient Active Problem List   Diagnosis Date Noted   Family history of colon cancer 01/20/2024   Exposure to hepatitis B 07/01/2023   Need for immunization against viral hepatitis 07/01/2023   Screening for STDs (sexually transmitted diseases) 07/01/2023   History of substance use 07/01/2023   S/P appendectomy 05/11/2023   S/P laparoscopic appendectomy 05/11/2023   Gasping for breath 08/19/2021   PTSD (post-traumatic stress disorder) 08/19/2021  Parasomnia overlap disorder 08/19/2021   Snoring 08/19/2021   Excessive daytime sleepiness 08/19/2021   Sleep paralysis, recurrent isolated 08/19/2021   Asthma 02/19/2021   Multiple drug allergies 02/19/2021   Gluten intolerance 02/19/2021   Chronic rhinitis 02/19/2021   Metatarsalgia of left foot 11/14/2020    Achilles tendinosis of right lower extremity 11/14/2020   Essential tremor 07/04/2019   DOE (dyspnea on exertion) 01/20/2019   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 06/13/2018   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   Superficial basal cell carcinoma 06/13/2018   Upper airway cough syndrome 06/13/2018   Atrophic vaginitis 02/17/2018   Bilateral leg cramps 02/17/2018   Fatty liver, alcoholic 04/22/2016   Depression 04/19/2016   Withdrawal complaint 04/19/2016   Hx of adenomatous colonic polyps 08/03/2015   Herpes simplex 06/15/2014   GERD (gastroesophageal reflux disease) 10/14/2013   Anxiety state 10/14/2013   Recovering alcoholic in remission (HCC) 10/14/2013   Allergic rhinitis 10/14/2013   Adjustment disorder with anxiety 10/14/2013   Elevated low-density lipoprotein level 10/14/2013   Essential hypertension 10/15/2012   Heart murmur 05/25/2012   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   chest - CX3 + 5FU   Squamous cell carcinoma of skin 09/27/2014   left thigh - tx p bx   Substance abuse (HCC)    Family History  Problem Relation Age of Onset   Dementia Mother    Diabetes Mother    Melanoma Father    Colon cancer Father 28       Survived   Heart disease Father    Heart attack Father    Rheum arthritis Father    Dementia Father    Healthy Sister    Colon cancer Brother    Breast  cancer Maternal Grandmother    Colon cancer Paternal Grandmother    Dementia Maternal Aunt    Dementia Maternal Uncle    Colon polyps Paternal Aunt    Pancreatic cancer Paternal Aunt    Rectal cancer Paternal Aunt    Colon cancer Paternal Aunt    Stomach cancer Neg Hx    Esophageal cancer Neg Hx    Liver cancer Neg Hx    Past Surgical History:  Procedure Laterality Date   ANKLE SURGERY     Bilateral    CATARACT EXTRACTION Bilateral    CERVICAL SPINE SURGERY  07/21/2021   LAPAROSCOPIC APPENDECTOMY N/A 05/11/2023   Procedure: APPENDECTOMY LAPAROSCOPIC;  Surgeon: Sebastian Moles, MD;  Location: [facility] OR;  Service: General;  Laterality: N/A;   TUBAL LIGATION     Social History   Occupational History   Occupation: [employer]  Tobacco Use   Smoking status: Former    Current packs/day: 0.00    Types: Cigarettes    Start date: 10/16/1966  Quit date: 10/15/1996    Years since quitting: 27.4   Smokeless tobacco: Never  Vaping Use   Vaping status: Never Used  Substance and Sexual Activity   Alcohol use: No   Drug use: No   Sexual activity: Yes

## 2024-03-28 ENCOUNTER — Ambulatory Visit (HOSPITAL_BASED_OUTPATIENT_CLINIC_OR_DEPARTMENT_OTHER): Admitting: Pulmonary Disease

## 2024-03-29 ENCOUNTER — Encounter: Payer: Self-pay | Admitting: Family Medicine

## 2024-03-31 ENCOUNTER — Ambulatory Visit (HOSPITAL_BASED_OUTPATIENT_CLINIC_OR_DEPARTMENT_OTHER): Admitting: Pulmonary Disease

## 2024-03-31 ENCOUNTER — Encounter (HOSPITAL_BASED_OUTPATIENT_CLINIC_OR_DEPARTMENT_OTHER): Payer: Self-pay | Admitting: Pulmonary Disease

## 2024-03-31 VITALS — BP 131/78 | HR 62 | Ht 67.0 in | Wt 158.1 lb

## 2024-03-31 DIAGNOSIS — J45998 Other asthma: Secondary | ICD-10-CM

## 2024-03-31 DIAGNOSIS — E785 Hyperlipidemia, unspecified: Secondary | ICD-10-CM | POA: Diagnosis not present

## 2024-03-31 DIAGNOSIS — K589 Irritable bowel syndrome without diarrhea: Secondary | ICD-10-CM | POA: Diagnosis not present

## 2024-03-31 DIAGNOSIS — J4521 Mild intermittent asthma with (acute) exacerbation: Secondary | ICD-10-CM

## 2024-03-31 DIAGNOSIS — Z87891 Personal history of nicotine dependence: Secondary | ICD-10-CM

## 2024-03-31 DIAGNOSIS — J4520 Mild intermittent asthma, uncomplicated: Secondary | ICD-10-CM

## 2024-03-31 DIAGNOSIS — R911 Solitary pulmonary nodule: Secondary | ICD-10-CM | POA: Diagnosis not present

## 2024-03-31 DIAGNOSIS — I10 Essential (primary) hypertension: Secondary | ICD-10-CM

## 2024-03-31 NOTE — Patient Instructions (Signed)
  Intermittent Acute bronchitis/asthma attack  --CONTINUE Symbicort  80-4.5 mcg every 6 hours as needed  --Consider taking more consistently 2 puffs twice a day during fall/winter months  --CONTINUE Albuterol AS NEEDED for shortness of breath  --Encourage regular aerobic exercise daily

## 2024-03-31 NOTE — Progress Notes (Unsigned)
 Subjective:   PATIENT ID: Donna [name] GENDER: female DOB: 09/16/1950, MRN: [id number]  Chief Complaint  Patient presents with   Asthma    Reason for Visit: Asthma follow-up  Donna [name] is a 73 year old female former smoker with adult onset asthma, HTN, HLD, essential tremor, IBS, HLD who present for asthma follow-up  Initial consult 03/2022 She was diagnosed with asthma in 2020/2021 with methacholine test in [place]. Previously on Flovent  and Alvesco  but unable to tolerate due to nausea. She reports unable to take prednisone due severe irritability. She was last seen by Allergy  Asthma for initial asthma evaluation in May 2023. On albuterol as needed for stable symptoms. Has previously tried ICS inhaler with no improvement.  Today she reports shortness of breath with walking. Also having non-productive cough. Denies wheezing. Uses albuterol prior to exercise. Cold air, cold food triggers and anxiety as well. Chemicals, strong odors will trigger. No recent exaerbations requiring treatment with antibiotics.  Has been evaluated by GI for reflux and negative. Previously had allergy  testing which is negative.   05/27/22 She was not able to tolerate Incruse due to urinary retention. She reports asthma attack associated with cold air and exercise. Used albuterol inhaler once every 2 weeks.  Faxed notes from Dr. Perri and Java[facility] in 2021-2023 reviewed. Notes report +methacholine challenge with 21% decrease in FEV1 discussed on 12/13/19 visit. Previously on Flovent . Sleep study was ordered on 04/29/21 for snoring and frequent nocturnal awakeneings. Falls asleep in meetings and fatigued.  11/05/22 Since our last visit she was prescribed low dose symbicort  as needed. Very minimal symptoms since our last visit and was able to even walk in the morning however she recently went to [place] for a week in early-mid June. She reports increased cough and unsure what triggered it but was exposed  to [place] and heat throughout the trip. Usually triggered by odors, chemicals, humidity. No illnesses the trip. Now using Symbicort  two puffs in the morning with some improvement.   07/05/23 Since our last visit she reports she has been coughing more frequently. Using her symbicort  two puffs in the morning. Wheezing at night. Previously used as needed. Scent, dust, humidity trigger her symptoms. Symptoms improve with warmer weather. She walks in the morning.   03/31/24 Since our last visit she exacerbation from pollen in May 2025. No respiratory exacerbations since then. Intermittently using Symbicort  and doing well. Allergy  testing returned + multiple trees. Considering allergy  shots.   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   chest - CX3 + 5FU  Squamous cell carcinoma of skin 09/27/2014   left thigh - tx p bx   Substance abuse (HCC)      Family History  Problem Relation Age of Onset   Dementia Mother    Diabetes Mother    Melanoma Father    Colon cancer Father 75       Survived   Heart disease Father    Heart attack Father    Rheum arthritis Father    Dementia Father    Healthy Sister    Colon cancer Brother    Breast cancer  Maternal Grandmother    Colon cancer Paternal Grandmother    Dementia Maternal Aunt    Dementia Maternal Uncle    Colon polyps Paternal Aunt    Pancreatic cancer Paternal Aunt    Rectal cancer Paternal Aunt    Colon cancer Paternal Aunt    Stomach cancer Neg Hx    Esophageal cancer Neg Hx    Liver cancer Neg Hx      Social History   Occupational History   Occupation: [employer]  Tobacco Use   Smoking status: Former    Current packs/day: 0.00    Types: Cigarettes    Start date: 10/16/1966    Quit date: 10/15/1996    Years since quitting: 27.4   Smokeless tobacco: Never  Vaping Use   Vaping status: Never Used  Substance and Sexual Activity   Alcohol use: No   Drug use: No   Sexual activity: Yes    Allergies  Allergen Reactions   Ciprofloxacin     Neuropathy    Corticosteroids Other (See Comments)    Unable to take oral, topical or injectable    Doxycycline Nausea Only   Gluten Meal     GI upset    Incruse Ellipta  [Umeclidinium Bromide ]     Urinary retention   Montelukast  Other (See Comments)   Penicillins Nausea And Vomiting    breaking out   Prednisone Other (See Comments)    Hyperactivity, mood changes, nausea   Quinolones Other (See Comments)    REACTION: sick / neuropathy    Statins Other (See Comments)     Outpatient Medications Prior to Visit  Medication Sig Dispense Refill   acetaminophen  (TYLENOL ) 325 MG tablet Take 650 mg by mouth every 6 (six) hours as needed.     albuterol (VENTOLIN HFA) 108 (90 Base) MCG/ACT inhaler Inhale 2 puffs into the lungs every 4 (four) hours as needed.     b complex vitamins tablet Take 1 tablet by mouth daily.       budesonide -formoterol  (SYMBICORT ) 80-4.5 MCG/ACT inhaler Inhale 2 puffs into the lungs every 6 (six) hours as needed. 10.2 each 2   busPIRone (BUSPAR) 5 MG tablet Take 5 mg by mouth daily.     calcium  gluconate 500 MG tablet Take 500 mg by mouth daily.     dicyclomine  (BENTYL ) 10 MG capsule Take 1 capsule (10  mg total) by mouth 4 (four) times daily -  before meals and at bedtime. 90 capsule 0   Efinaconazole  (JUBLIA ) 10 % SOLN Apply 1 application  topically at bedtime. 8 mL 11   Estradiol  (YUVAFEM ) 10 MCG TABS vaginal tablet Place 1 tablet vaginally once a week. On Sundays     lisinopril  (ZESTRIL ) 5 MG tablet TAKE 1 TABLET BY MOUTH DAILY FOR ELEVATED BP WHILE TRAVELING 90 tablet 1   Magnesium 500 MG TABS Take 1 tablet by mouth daily.  meloxicam  (MOBIC ) 15 MG tablet Take 1 tablet (15 mg total) by mouth daily. 30 tablet 0   Multiple Vitamin (MULTIVITAMIN) capsule Take 1 capsule by mouth daily.       mupirocin  ointment (BACTROBAN ) 2 % APPLY TO AFFECTED AREA TWICE A DAY 22 g 5   propranolol  (INDERAL ) 10 MG tablet Take 10 mg by mouth as needed.     valACYclovir  (VALTREX ) 500 MG tablet ONE BY MOUTH TWICE A DAY X 5 DAYS FOR HERPES OUTBREAK 10 tablet prn   umeclidinium bromide  (INCRUSE ELLIPTA ) 62.5 MCG/ACT AEPB Inhale 1 puff into the lungs daily. (Patient not taking: Reported on 03/31/2024) 30 each 1   No facility-administered medications prior to visit.    Review of Systems  Constitutional:  Negative for chills, diaphoresis, fever, malaise/fatigue and weight loss.  HENT:  Negative for congestion.   Respiratory:  Positive for cough and wheezing. Negative for hemoptysis, sputum production and shortness of breath.   Cardiovascular:  Negative for chest pain, palpitations and leg swelling.     Objective:   Vitals:   03/31/24 1157  BP: 131/78  Pulse: 62  SpO2: 99%  Weight: 158 lb 1.6 oz (71.7 kg)  Height: 5' 7 (1.702 m)    SpO2: 99 %  Physical Exam: General: Well-appearing, no acute distress HENT: [place], AT Eyes: EOMI, no scleral icterus Respiratory: ***Clear to auscultation bilaterally.  No crackles, wheezing or rales Cardiovascular: RRR, -M/R/G, no JVD Extremities:-Edema,-tenderness Neuro: AAO x4, CNII-XII grossly intact Psych: Normal mood, normal affect  Data  Reviewed:  Imaging: CT Chest 06/16/21 ([facility] report only) - 2 mm LL nodule, decreased compared to prior CT Cardiac 05/06/22 - Visualized lung parenchyma with stable 3mm LLL lung nodule. No masses, infiltrate, effusion or pneumothorax. CT Coronary 08/06/22 - Visualized lung parenchyma with no pulmonary nodules, masses, infiltrate, effusion or pneumothorax.  PFT: 09/07/19 FVC 3.07 (86%) FEV1 2.35 (86%) Ratio 76  TLC 74% DLCO 93% Interpretation: Mild obstructive and restrictive defect. Normal DLCO  Spirometry 08/25/21 FVC 2.25 (71%) FEV1 1.42 (58%) Ratio 0.63  Spirometry 10/03/21 FVC 1.98 (62%) FEV1 1.5 (61%) Ratio 0.76  Labs: CBC    Component Value Date/Time   WBC 4.5 12/01/2023 1155   RBC 3.91 12/01/2023 1155   HGB 11.8 (L) 12/01/2023 1155   HCT 34.8 (L) 12/01/2023 1155   PLT 239 12/01/2023 1155   MCV 89.0 12/01/2023 1155   MCH 30.2 12/01/2023 1155   MCHC 33.9 12/01/2023 1155   RDW 12.8 12/01/2023 1155   LYMPHSABS 1.0 12/01/2023 1155   MONOABS 0.5 12/01/2023 1155   EOSABS 0.2 12/01/2023 1155   BASOSABS 0.0 12/01/2023 1155   Abs eos 07/02/20 - 61     Assessment & Plan:   Discussion: 73 year old female former smoker with adult onset asthma, HTN, HLD, essential tremor, IBS, HLD who present for asthma follow-up. Symptoms improved on low-dose Symbicort . Counseled on bronchodilator management as noted below  Unable to tolerate ICS due to agitation (previously on Flovent ). Unable to tolerate LAMA due to urinary retention. Discussed option for ICS/SABA PRN.    If needed in the future, consider low dose prednisone for exacerbation due to agitation (20 mg).  Intermittent Acute bronchitis/asthma attack  --CONTINUE Symbicort  80-4.5 mcg every 6 hours as needed  --Consider taking more consistently 2 puffs twice a day during fall/winter months  --CONTINUE Albuterol AS NEEDED for shortness of breath  --Encourage regular aerobic exercise daily  Left lower lobe lung nodule,  benign --No further follow-up  Health Maintenance Immunization History  Administered Date(s) Administered   Influenza,trivalent, recombinat, inj, PF 02/16/2014   Influenza-Unspecified 02/22/2014, 02/05/2015, 02/05/2015   PFIZER Comirnaty(Gray Top)Covid-19 Tri-Sucrose Vaccine 06/11/2020   PFIZER(Purple Top)SARS-COV-2 Vaccination 07/09/2019, 08/02/2019, 12/28/2019   Tdap 05/26/2007, 07/12/2018   CT Lung Screen- not qualified. Quit smoking >15 years  No orders of the defined types were placed in this encounter.  No orders of the defined types were placed in this encounter.   No follow-ups on file.   I have spent a total time of 32-minutes on the day of the appointment including chart review, data review, collecting history, coordinating care and discussing medical diagnosis and plan with the patient/family. Past medical history, allergies, medications were reviewed. Pertinent imaging, labs and tests included in this note have been reviewed and interpreted independently by me.  Dayton Sherr Slater Staff, MD [facility] 03/31/2024 12:32 PM  Office Number [phone]

## 2024-04-02 ENCOUNTER — Encounter (HOSPITAL_BASED_OUTPATIENT_CLINIC_OR_DEPARTMENT_OTHER): Payer: Self-pay | Admitting: Pulmonary Disease

## 2024-04-03 ENCOUNTER — Encounter: Payer: Self-pay | Admitting: Family Medicine

## 2024-04-04 ENCOUNTER — Encounter: Payer: Self-pay | Admitting: Family Medicine

## 2024-04-05 ENCOUNTER — Ambulatory Visit
Admission: RE | Admit: 2024-04-05 | Discharge: 2024-04-05 | Disposition: A | Discharge status: Home / Self Care | Source: Ambulatory Visit | Attending: Family Medicine | Admitting: Family Medicine

## 2024-04-05 DIAGNOSIS — N281 Cyst of kidney, acquired: Secondary | ICD-10-CM | POA: Diagnosis not present

## 2024-04-05 DIAGNOSIS — F418 Other specified anxiety disorders: Secondary | ICD-10-CM

## 2024-04-05 DIAGNOSIS — R935 Abnormal findings on diagnostic imaging of other abdominal regions, including retroperitoneum: Secondary | ICD-10-CM

## 2024-04-05 DIAGNOSIS — R1085 Abdominal pain of multiple sites: Secondary | ICD-10-CM

## 2024-04-05 DIAGNOSIS — Z85828 Personal history of other malignant neoplasm of skin: Secondary | ICD-10-CM

## 2024-04-05 MED ORDER — GADOPICLENOL 0.5 MMOL/ML IV SOLN
7.0000 mL | Freq: Once | INTRAVENOUS | Status: AC | PRN
  Administered 2024-04-05: 7 mL via INTRAVENOUS

## 2024-04-11 DIAGNOSIS — R1084 Generalized abdominal pain: Secondary | ICD-10-CM | POA: Diagnosis not present

## 2024-04-12 ENCOUNTER — Encounter: Payer: Self-pay | Admitting: Physical Therapy

## 2024-04-13 ENCOUNTER — Other Ambulatory Visit

## 2024-04-19 ENCOUNTER — Ambulatory Visit: Admitting: Dermatology

## 2024-04-19 ENCOUNTER — Encounter: Payer: Self-pay | Admitting: Dermatology

## 2024-04-19 DIAGNOSIS — L570 Actinic keratosis: Secondary | ICD-10-CM

## 2024-04-19 DIAGNOSIS — Z1283 Encounter for screening for malignant neoplasm of skin: Secondary | ICD-10-CM

## 2024-04-19 DIAGNOSIS — D1801 Hemangioma of skin and subcutaneous tissue: Secondary | ICD-10-CM

## 2024-04-19 DIAGNOSIS — B079 Viral wart, unspecified: Secondary | ICD-10-CM

## 2024-04-19 DIAGNOSIS — D229 Melanocytic nevi, unspecified: Secondary | ICD-10-CM

## 2024-04-19 DIAGNOSIS — L821 Other seborrheic keratosis: Secondary | ICD-10-CM

## 2024-04-19 DIAGNOSIS — L814 Other melanin hyperpigmentation: Secondary | ICD-10-CM

## 2024-04-19 DIAGNOSIS — D099 Carcinoma in situ, unspecified: Secondary | ICD-10-CM

## 2024-04-19 DIAGNOSIS — L578 Other skin changes due to chronic exposure to nonionizing radiation: Secondary | ICD-10-CM

## 2024-04-19 NOTE — Telephone Encounter (Signed)
 LVM stating that I would give the patient a call back to touch base and answer her questions.

## 2024-04-19 NOTE — Patient Instructions (Addendum)

## 2024-04-19 NOTE — Progress Notes (Addendum)
 "  Total Body Skin Exam (TBSE) Visit   Patient (and/or pt guardian) consented to the use of AI-assisted tools for note generation.  Subjective  Donna [name] is a 73 y.o. female who presents for the following: Skin Cancer Screening and Full Body Skin Exam  Patient was last evaluated on 02/07/2024 .  Patient denies medication changes.  Patient reports she does have spots, moles and lesions of concern to be evaluated. Patient reports throughout her lifetime she has had moderate sun exposure.  Currently, patient reports if she has excessive sun exposure, she does apply sunscreen and/or wears protective coverings.  Patient reports she has hx of bx. Patient has hx of SCC and SCCIS  Patient reports family history of skin cancers.  The patient has spots, moles and lesions to be evaluated, some may be new or changing and the patient has concerns that these could be cancer.  Patient would like to discuss spots on face. One near mouth and one on forehead Dark spot on the top of right ring finger Wart on right thumb Patient also presents to day for cryotherapy treatment for SCCIS on left forearm (previously biopsied)   The following portions of the chart were reviewed this encounter and updated as appropriate: medications, allergies, medical history  Review of Systems:  No other skin or systemic complaints except as noted in HPI or Assessment and Plan.  Objective  Well appearing patient in no apparent distress; mood and affect are within normal limits.  A full examination was performed including scalp, head, eyes, ears, nose, lips, neck, chest, axillae, abdomen, back, buttocks, bilateral upper extremities, bilateral lower extremities, hands, feet, fingers, toes, fingernails, and toenails. All findings within normal limits unless otherwise noted below.   Relevant physical exam findings are noted in the Assessment and Plan.  Left Forearm - Posterior Biposy proven SCCIS on left  forearm  Assessment & Plan   LENTIGINES, SEBORRHEIC KERATOSES, HEMANGIOMAS - Benign normal skin lesions - Benign-appearing - Call for any changes  MELANOCYTIC NEVI - Tan-brown and/or pink-flesh-colored symmetric macules and papules - Benign appearing on exam today - Observation - Call clinic for new or changing moles - Recommend daily use of broad spectrum spf 30+ sunscreen to sun-exposed areas.   ACTINIC DAMAGE - Chronic condition, secondary to cumulative UV/sun exposure - diffuse scaly erythematous macules with underlying dyspigmentation - Recommend daily broad spectrum sunscreen SPF 30+ to sun-exposed areas, reapply every 2 hours as needed.  - Staying in the shade or wearing long sleeves, sun glasses (UVA+UVB protection) and wide brim hats (4-inch brim around the entire circumference of the hat) are also recommended for sun protection.  - Call for new or changing lesions.  HISTORY OF SQUAMOUS CELL CARCINOMA IN SITU OF THE SKIN - No evidence of recurrence today - Recommend regular full body skin exams - Recommend daily broad spectrum sunscreen SPF 30+ to sun-exposed areas, reapply every 2 hours as needed.  - Call if any new or changing lesions are noted between office visits   SKIN CANCER SCREENING PERFORMED TODAY.    SQUAMOUS CELL CARCINOMA IN SITU (SCCIS) Left Forearm - Posterior - Destruction of lesion Complexity: simple   Destruction method: cryotherapy   Informed consent: discussed and consent obtained   Timeout:  patient name, date of birth, surgical site, and procedure verified Cryotherapy cycles:  2 Lesion length (cm):  3 Margin per side (cm):  0.3 Final wound size (cm):  3.6 Outcome: patient tolerated procedure well with no complications  Post-procedure details: wound care instructions given    ACTINIC KERATOSIS (6) Left Lower Leg - Anterior, Left Lower Leg - Posterior, Right Dorsal Hand, Right Dorsal Mid 4th Finger, Right Lower Leg - Anterior, Right Upper  Cutaneous Lip - Destruction of lesion - Left Lower Leg - Anterior, Left Lower Leg - Posterior, Right Dorsal Hand, Right Dorsal Mid 4th Finger, Right Lower Leg - Anterior, Right Upper Cutaneous Lip Complexity: simple   Destruction method: cryotherapy   Timeout:  patient name, date of birth, surgical site, and procedure verified Cryotherapy cycles:  6 Outcome: patient tolerated procedure well with no complications   Post-procedure details: wound care instructions given    VIRAL WARTS, UNSPECIFIED TYPE Right Distal Thumb - Destruction of lesion - Right Distal Thumb Complexity: simple   Destruction method: cryotherapy   Timeout:  patient name, date of birth, surgical site, and procedure verified Cryotherapy cycles:  3 Outcome: patient tolerated procedure well with no complications   Post-procedure details: wound care instructions given    SKIN EXAM FOR MALIGNANT NEOPLASM   MULTIPLE BENIGN MELANOCYTIC NEVI   CHERRY ANGIOMA   LENTIGINES   SEBORRHEIC KERATOSIS   ACTINIC SKIN DAMAGE   Return in 3 months (on 07/18/2024) for FBSE F/U.  I, Donna [name], as acting as a neurosurgeon for [employer], DO .   Documentation: I have reviewed the above documentation for accuracy and completeness, and I agree with the above.  Donna Lenis, DO   "

## 2024-04-21 ENCOUNTER — Encounter: Payer: Self-pay | Admitting: Cardiovascular Disease

## 2024-04-24 ENCOUNTER — Telehealth: Payer: Self-pay | Admitting: Dermatology

## 2024-04-24 NOTE — Telephone Encounter (Signed)
 Returned patient's call in regards to questions concerning moisturizers to use for thick skin and to answer questions about her recent [facility] treatment for SCCIS. Patient was advised that there is no further treatment necessary for that location. Patient was told at last appointment to follow up in 3 months for FBSE but cannot currently be scheduled. Patient was notified that she is on a call list and will be contacted once an appointment is available for her.

## 2024-04-25 DIAGNOSIS — K7689 Other specified diseases of liver: Secondary | ICD-10-CM | POA: Diagnosis not present

## 2024-04-26 ENCOUNTER — Encounter: Payer: Self-pay | Admitting: Physical Therapy

## 2024-05-01 ENCOUNTER — Encounter: Admitting: Physical Therapy

## 2024-05-18 ENCOUNTER — Telehealth: Payer: Self-pay | Admitting: Internal Medicine

## 2024-05-18 NOTE — Telephone Encounter (Signed)
 I appreciate the request. However, I have a reduced schedule that started this January. Thus, I cannot take patients with established gastroenterologist. Please tell the patient thank you for the request.  Also, tell her that Dr. Stacia is quite good. Thanks, Dr. Abran

## 2024-05-18 NOTE — Telephone Encounter (Signed)
 Good morning Dr. Abran  The following patient is requesting to transfer her care from Dr. Stacia. She stated that she has had several references from people to have you as her provider so she is seeking to be your patient. Are you willing to take over her care? Please advise. Thank you.

## 2024-05-23 ENCOUNTER — Encounter: Payer: Self-pay | Admitting: Family Medicine

## 2024-05-23 ENCOUNTER — Ambulatory Visit: Admitting: Family Medicine

## 2024-05-23 ENCOUNTER — Ambulatory Visit: Payer: Self-pay | Admitting: Family Medicine

## 2024-05-23 ENCOUNTER — Ambulatory Visit: Admitting: Allergy & Immunology

## 2024-05-23 VITALS — BP 130/80 | HR 60 | Temp 98.1°F | Resp 16 | Ht 67.0 in | Wt 153.2 lb

## 2024-05-23 DIAGNOSIS — I10 Essential (primary) hypertension: Secondary | ICD-10-CM | POA: Diagnosis not present

## 2024-05-23 DIAGNOSIS — F1011 Alcohol abuse, in remission: Secondary | ICD-10-CM

## 2024-05-23 DIAGNOSIS — R197 Diarrhea, unspecified: Secondary | ICD-10-CM

## 2024-05-23 DIAGNOSIS — R1084 Generalized abdominal pain: Secondary | ICD-10-CM | POA: Diagnosis not present

## 2024-05-23 DIAGNOSIS — K219 Gastro-esophageal reflux disease without esophagitis: Secondary | ICD-10-CM | POA: Diagnosis not present

## 2024-05-23 LAB — COMPREHENSIVE METABOLIC PANEL WITH GFR
ALT: 14 U/L (ref 3–35)
AST: 20 U/L (ref 5–37)
Albumin: 4.2 g/dL (ref 3.5–5.2)
Alkaline Phosphatase: 59 U/L (ref 39–117)
BUN: 10 mg/dL (ref 6–23)
CO2: 29 meq/L (ref 19–32)
Calcium: 9.1 mg/dL (ref 8.4–10.5)
Chloride: 96 meq/L (ref 96–112)
Creatinine, Ser: 0.6 mg/dL (ref 0.40–1.20)
GFR: 89.67 mL/min
Glucose, Bld: 91 mg/dL (ref 70–99)
Potassium: 4 meq/L (ref 3.5–5.1)
Sodium: 131 meq/L — ABNORMAL LOW (ref 135–145)
Total Bilirubin: 0.6 mg/dL (ref 0.2–1.2)
Total Protein: 6.8 g/dL (ref 6.0–8.3)

## 2024-05-23 LAB — CBC
HCT: 37.8 % (ref 36.0–46.0)
Hemoglobin: 13 g/dL (ref 12.0–15.0)
MCHC: 34.3 g/dL (ref 30.0–36.0)
MCV: 89.6 fl (ref 78.0–100.0)
Platelets: 249 K/uL (ref 150.0–400.0)
RBC: 4.21 Mil/uL (ref 3.87–5.11)
RDW: 13.3 % (ref 11.5–15.5)
WBC: 4.7 K/uL (ref 4.0–10.5)

## 2024-05-23 LAB — C-REACTIVE PROTEIN: CRP: <0.5 mg/dL — ABNORMAL LOW (ref 1.0–20.0)

## 2024-05-23 LAB — TSH: TSH: 1.59 u[IU]/mL (ref 0.35–5.50)

## 2024-05-23 MED ORDER — OMEPRAZOLE 40 MG PO CPDR: 40.0000 mg | DELAYED_RELEASE_CAPSULE | Freq: Every day | ORAL | 0 refills | Status: AC

## 2024-05-23 NOTE — Assessment & Plan Note (Addendum)
 She reports normal BPs at home on nonpharmacologic treatment.  She takes lisinopril  5 mg daily as needed, especially when she travels to [place] . For now no changes but will address in more detail next visit. She also takes propranolol  10 mg daily as needed for essential tremor. Continue low-salt diet. Continue monitoring BP at home.

## 2024-05-23 NOTE — Assessment & Plan Note (Signed)
 Occasional heartburn, she agrees with trial of PPI, omeprazole  40 mg 30 minutes before breakfast for 8 weeks, then we can try to decrease dose to 20 mg daily. Continue GERD precautions. Follow-up in 2 months if she is not able to see GI at that time, otherwise I will see her back in 3 to 4 months.

## 2024-05-23 NOTE — Patient Instructions (Addendum)
 A few things to remember from today's visit:  Diarrhea, unspecified type - Plan: Cdiff NAA+O+P+Stool Culture, CBC, Comprehensive metabolic panel with GFR, TSH  Alcohol abuse, in remission  Abdominal pain, generalized - Plan: CBC, C-reactive protein, Comprehensive metabolic panel with GFR, omeprazole  (PRILOSEC) 40 MG capsule  Dyspepsia - Plan: omeprazole  (PRILOSEC) 40 MG capsule  Pending appt with gastroenterologist. Let's try Omeprazole  40 mg 30 min before breakfast for 8 weeks then we can try to decrease dose to 20 mg.  If you need refills for medications you take chronically, please call your pharmacy. Do not use My Chart to request refills or for acute issues that need immediate attention. If you send a my chart message, it may take a few days to be addressed, specially if I am not in the office.  Please be sure medication list is accurate. If a new problem present, please set up appointment sooner than planned today.

## 2024-05-23 NOTE — Progress Notes (Signed)
 "   Chief Complaint  Patient presents with   Establish Care   Discussed the use of AI scribe software for clinical note transcription with the patient, who gave verbal consent to proceed.  History of Present Illness Donna [name] is a 74 year old female who presents with chronic abdominal pain and changes in bowel habits.with past medical history significant for hypertension, allergic rhinitis, asthma, GERD, hepatic steatosis, anxiety, and depression among some here today to establish care.  Former PCP: Dr Gerome  History of SCC, follows with dermatologist. HTN: She takes Lisinopril  5 mg daily as needed. Essential tremor: On Propranolol  10 mg daily prn. She also takes Buspar 5 mg daily for anxiety and depression.   She has been experiencing severe abdominal pain since June 2025, initially suspecting appendicitis. The pain is described as 'horrendous' and unpredictable, occurring in different areas of the abdomen. It is not consistently triggered by food, stress, or sleep patterns. She has tried eliminating and reintroducing foods without identifying a specific trigger. The pain sometimes improves with bowel movements but not consistently.  She has a history of taking five rounds of antibiotics between April and July 2025 to treat different conditions, after which the abdominal pain began.  She has had abdominal imaging done, abdomen/pelvic CT and MRI and has seen oncologist and general surgical for consultation. Abdomen MRI 04/12/24: No acute inflammatory process identified within the abdomen. No suspicious mass or lymphadenopathy.  She has been taking peppermint capsules, which have provided some relief.  She reports associated changes in her bowel habits since the onset of her symptoms, including darker stools, occasional blood on toilet paper, and variations in stool consistency from pencil-thin to mushy. She experiences bowel movements daily, but the frequency decreases during  episodes of severe pain. No stool culture or tests for C. diff infection have been conducted.  She has a history of anemia . Colonoscopy in 07/2021.  She has not traveled outside the country or sick contact around the time symptoms started.  No melena or difficulty swallowing.  Mild heartburn.  Lab Results  Component Value Date   TSH 1.35 07/02/2020   Hx of alcohol abuse, she has not drunken in 8 years. She tries to exercise regularly, when she is not in pain. Follows a healthful diet. Former smoker, quit in 1998.  Review of Systems  Constitutional:  Negative for activity change, appetite change, chills and fever.  HENT:  Negative for mouth sores and sore throat.   Respiratory:  Negative for cough, shortness of breath and wheezing.   Cardiovascular:  Negative for chest pain and leg swelling.  Gastrointestinal:  Negative for nausea.  Endocrine: Negative for cold intolerance and heat intolerance.  Genitourinary:  Negative for decreased urine volume, dysuria and hematuria.  Skin:  Negative for rash.  Neurological:  Negative for syncope, weakness and headaches.  Psychiatric/Behavioral:  Negative for confusion and hallucinations.   See other pertinent positives and negatives in HPI.  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   chest - CX3 + 5FU   Squamous cell carcinoma of skin 09/27/2014   left  thigh - tx p bx   Substance abuse (HCC)    Allergies[2]  Family History  Problem Relation Age of Onset   Dementia Mother    Diabetes Mother    Melanoma Father    Colon cancer Father 82       Survived   Heart disease Father    Heart attack Father    Rheum arthritis Father    Dementia Father    Healthy Sister    Colon cancer Brother    Breast cancer Maternal Grandmother    Colon cancer Paternal Grandmother    Dementia Maternal Aunt    Dementia Maternal Uncle    Colon polyps Paternal Aunt    Pancreatic cancer Paternal Aunt    Rectal cancer Paternal Aunt    Colon cancer Paternal Aunt    Stomach cancer Neg Hx    Esophageal cancer Neg Hx    Liver cancer Neg Hx     Social History   Socioeconomic History   Marital status: Married    Spouse name: Donna [name]   Number of children: 0   Years of education: Not on file   Highest education level: Associate degree: academic program  Occupational History   Occupation: [employer]  Tobacco Use   Smoking status: Former    Current packs/day: 0.00    Types: Cigarettes    Start date: 10/16/1966    Quit date: 10/15/1996    Years since quitting: 27.6   Smokeless tobacco: Never  Vaping Use   Vaping status: Never Used  Substance and Sexual Activity   Alcohol use: No   Drug use: No   Sexual activity: Yes  Other Topics Concern   Not on file  Social History Narrative   Lives with husband   Right handed   1-2 caffeine drinks daily   Social Drivers of Health   Tobacco Use: Medium Risk (05/23/2024)   Patient History    Smoking Tobacco Use: Former    Smokeless Tobacco Use: Never    Passive Exposure: Not on file  Financial Resource Strain: Patient Declined (05/22/2024)   Overall Financial Resource Strain (CARDIA)    Difficulty of Paying Living Expenses: Patient declined  Food Insecurity: No Food Insecurity (05/22/2024)   Epic    Worried About Programme Researcher, Broadcasting/film/video in the Last Year: Never true    Ran Out of Food in the Last Year: Never true   Transportation Needs: No Transportation Needs (05/22/2024)   Epic    Lack of Transportation (Medical): No    Lack of Transportation (Non-Medical): No  Physical Activity: Sufficiently Active (05/22/2024)   Exercise Vital Sign    Days of Exercise per Week: 6 days    Minutes of Exercise per Session: 60 min  Stress: Stress Concern Present (05/22/2024)   [employer] of Occupational Health - Occupational Stress Questionnaire    Feeling of Stress: Very much  Social Connections: Moderately Isolated (05/22/2024)   Social Connection and Isolation Panel    Frequency of Communication with Friends and Family: Three times a week    Frequency of Social Gatherings with Friends and Family: Once a week    Attends Religious Services: Patient declined  Active Member of Clubs or Organizations: No    Attends Banker Meetings: Not on file    Marital Status: Married  Depression (PHQ2-9): Low Risk (05/23/2024)   Depression (PHQ2-9)    PHQ-2 Score: 0  Alcohol Screen: Not on file  Housing: Unknown (05/22/2024)   Epic    Unable to Pay for Housing in the Last Year: No    Number of Times Moved in the Last Year: Not on file    Homeless in the Last Year: No  Utilities: Not At Risk (05/11/2023)   [facility] Utilities    Threatened with loss of utilities: No  Health Literacy: Not on file    Vitals:   05/23/24 0859  BP: 130/80  Pulse: 60  Resp: 16  Temp: 98.1 F (36.7 C)  SpO2: 98%   Body mass index is 23.99 kg/m.  Physical Exam Vitals and nursing note reviewed.  Constitutional:      General: She is not in acute distress.    Appearance: She is well-developed.  HENT:     Head: Normocephalic and atraumatic.     Mouth/Throat:     Mouth: Mucous membranes are moist.     Pharynx: Oropharynx is clear.  Eyes:     Conjunctiva/sclera: Conjunctivae normal.  Cardiovascular:     Rate and Rhythm: Normal rate and regular rhythm.     Pulses:          Dorsalis pedis pulses are 2+ on the right side  and 2+ on the left side.     Heart sounds: No murmur heard. Pulmonary:     Effort: Pulmonary effort is normal. No respiratory distress.     Breath sounds: Normal breath sounds.  Abdominal:     Palpations: Abdomen is soft. There is no hepatomegaly or mass.     Tenderness: There is abdominal tenderness in the right upper quadrant, epigastric area and left upper quadrant. There is no guarding or rebound.  Lymphadenopathy:     Cervical: No cervical adenopathy.     Upper Body:     Right upper body: No supraclavicular adenopathy.     Left upper body: No supraclavicular adenopathy.  Skin:    General: Skin is warm.     Findings: No erythema or rash.  Neurological:     General: No focal deficit present.     Mental Status: She is alert and oriented to person, place, and time.     Gait: Gait normal.  Psychiatric:        Mood and Affect: Affect normal. Mood is anxious.    ASSESSMENT AND PLAN:  Ms. Kashina Mecum was seen today for establish care.  Diagnoses and all orders for this visit: Orders Placed This Encounter  Procedures   Cdiff NAA+O+P+Stool Culture   CBC   C-reactive protein   Comprehensive metabolic panel with GFR   TSH   Lab Results  Component Value Date   NA 131 (L) 05/23/2024   CL 96 05/23/2024   K 4.0 05/23/2024   CO2 29 05/23/2024   BUN 10 05/23/2024   CREATININE 0.60 05/23/2024   GFR 89.67 05/23/2024   CALCIUM  9.1 05/23/2024   ALBUMIN 4.2 05/23/2024   GLUCOSE 91 05/23/2024   Lab Results  Component Value Date   TSH 1.59 05/23/2024   Lab Results  Component Value Date   CRP <0.5 (L) 05/23/2024   Lab Results  Component Value Date   WBC 4.7 05/23/2024   HGB 13.0 05/23/2024   HCT 37.8  05/23/2024   MCV 89.6 05/23/2024   PLT 249.0 05/23/2024   Lab Results  Component Value Date   ALT 14 05/23/2024   AST 20 05/23/2024   ALKPHOS 59 05/23/2024   BILITOT 0.6 05/23/2024   Diarrhea, unspecified type She reports some changes in stool consistency after  completing several treatments with antibiotics.  Generalized abdominal pain also started after antibiotic use, states that she has not had stool examination done, so today we decided on stool culture, parasite and ova, and C. difficile. She has seen some blood after defecation. Pending appointment with GI. Last colonoscopy in 2023.  -     Cdiff NAA+O+P+Stool Culture -     CBC; Future -     Comprehensive metabolic panel with GFR; Future -     TSH; Future  Alcohol abuse, in remission Assessment & Plan: She has not drunken any alcohol in 8 years.  Abdominal pain, generalized Possible etiologies discussed. Abdominal imaging has not reveled abnormalities that explain pain. Colonoscopy in 07/2021. ? IBS. Pending appt with GI.  -     CBC; Future -     C-reactive protein; Future -     Comprehensive metabolic panel with GFR; Future -     Omeprazole ; Take 1 capsule (40 mg total) by mouth daily before breakfast.  Dispense: 90 capsule; Refill: 0  Essential hypertension Assessment & Plan: She reports normal BPs at home on nonpharmacologic treatment.  She takes lisinopril  5 mg daily as needed, especially when she travels to [place] . For now no changes but will address in more detail next visit. She also takes propranolol  10 mg daily as needed for essential tremor. Continue low-salt diet. Continue monitoring BP at home.  Gastroesophageal reflux disease, unspecified whether esophagitis present Assessment & Plan: Occasional heartburn, she agrees with trial of PPI, omeprazole  40 mg 30 minutes before breakfast for 8 weeks, then we can try to decrease dose to 20 mg daily. Continue GERD precautions. Follow-up in 2 months if she is not able to see GI at that time, otherwise I will see her back in 3 to 4 months.  Orders: -     Omeprazole ; Take 1 capsule (40 mg total) by mouth daily before breakfast.  Dispense: 90 capsule; Refill: 0  I personally spent a total of 53 minutes in the care of the  patient today including preparing to see the patient, getting/reviewing separately obtained history, performing a medically appropriate exam/evaluation, counseling and educating, placing orders, documenting clinical information in the EHR, and independently interpreting results.  Return in about 2 months (around 07/21/2024), or if not able to see GI, otherwise 4-5 months..  Jaisen Wiltrout G. Odis Turck, MD  [facility]. [place] office.     [1]  Current Outpatient Medications on File Prior to Visit  Medication Sig Dispense Refill   acetaminophen  (TYLENOL ) 325 MG tablet Take 650 mg by mouth every 6 (six) hours as needed.     albuterol (VENTOLIN HFA) 108 (90 Base) MCG/ACT inhaler Inhale 2 puffs into the lungs every 4 (four) hours as needed.     b complex vitamins tablet Take 1 tablet by mouth daily.       budesonide -formoterol  (SYMBICORT ) 80-4.5 MCG/ACT inhaler Inhale 2 puffs into the lungs every 6 (six) hours as needed. 10.2 each 2   busPIRone (BUSPAR) 5 MG tablet Take 5 mg by mouth daily.     calcium  gluconate 500 MG tablet Take 500 mg by mouth daily.     Efinaconazole  (JUBLIA ) 10 %  SOLN Apply 1 application  topically at bedtime. 8 mL 11   Estradiol  (YUVAFEM ) 10 MCG TABS vaginal tablet Place 1 tablet vaginally once a week. On Sundays     lisinopril  (ZESTRIL ) 5 MG tablet TAKE 1 TABLET BY MOUTH DAILY FOR ELEVATED BP WHILE TRAVELING (Patient taking differently: as needed. TAKE 1 TABLET BY MOUTH DAILY FOR ELEVATED BP WHILE TRAVELING  As needed) 90 tablet 1   Magnesium 500 MG TABS Take 1 tablet by mouth daily.       Multiple Vitamin (MULTIVITAMIN) capsule Take 1 capsule by mouth daily.       mupirocin  ointment (BACTROBAN ) 2 % APPLY TO AFFECTED AREA TWICE A DAY (Patient taking differently: as needed.) 22 g 5   propranolol  (INDERAL ) 10 MG tablet Take 10 mg by mouth as needed.     valACYclovir  (VALTREX ) 500 MG tablet ONE BY MOUTH TWICE A DAY X 5 DAYS FOR HERPES OUTBREAK (Patient taking differently:  as needed. ONE BY MOUTH TWICE A DAY X 5 DAYS FOR HERPES OUTBREAK  As needed) 10 tablet prn   meloxicam  (MOBIC ) 15 MG tablet Take 1 tablet (15 mg total) by mouth daily. (Patient not taking: Reported on 05/23/2024) 30 tablet 0   No current facility-administered medications on file prior to visit.  [2]  Allergies Allergen Reactions   Ciprofloxacin     Neuropathy    Corticosteroids Other (See Comments)    Unable to take oral, topical or injectable    Doxycycline Nausea Only   Gluten Meal     GI upset    Incruse Ellipta  [Umeclidinium Bromide ]     Urinary retention   Montelukast  Other (See Comments)   Penicillins Nausea And Vomiting    breaking out   Prednisone Other (See Comments)    Hyperactivity, mood changes, nausea   Quinolones Other (See Comments)    REACTION: sick / neuropathy    Statins Other (See Comments)   Flumist [Influenza Virus Vaccine Live] Rash   "

## 2024-05-23 NOTE — Assessment & Plan Note (Signed)
 She has not drunken any alcohol in 8 years.

## 2024-05-26 ENCOUNTER — Other Ambulatory Visit: Payer: Self-pay | Admitting: Family Medicine

## 2024-05-26 ENCOUNTER — Encounter: Payer: Self-pay | Admitting: Family Medicine

## 2024-05-30 ENCOUNTER — Ambulatory Visit: Payer: Self-pay | Admitting: Family Medicine

## 2024-05-30 LAB — CDIFF NAA+O+P+STOOL CULTURE: Toxigenic C. Difficile by PCR: NEGATIVE

## 2024-06-07 ENCOUNTER — Ambulatory Visit: Admitting: Family Medicine

## 2024-06-09 ENCOUNTER — Other Ambulatory Visit: Payer: Self-pay | Admitting: Family Medicine

## 2024-06-09 DIAGNOSIS — W57XXXS Bitten or stung by nonvenomous insect and other nonvenomous arthropods, sequela: Secondary | ICD-10-CM

## 2024-06-12 ENCOUNTER — Telehealth: Payer: Self-pay | Admitting: *Deleted

## 2024-06-12 NOTE — Telephone Encounter (Signed)
 Copied from CRM [id number]. Topic: General - Other >> Jun 12, 2024 10:21 AM Deleta RAMAN wrote: Reason for CRM: patient would to know if she would have to fast for lab visit on 2/3 please contact or respond via mychart

## 2024-06-13 ENCOUNTER — Other Ambulatory Visit

## 2024-06-13 DIAGNOSIS — T1490XA Injury, unspecified, initial encounter: Secondary | ICD-10-CM

## 2024-06-13 DIAGNOSIS — W57XXXS Bitten or stung by nonvenomous insect and other nonvenomous arthropods, sequela: Secondary | ICD-10-CM

## 2024-06-13 NOTE — Progress Notes (Unsigned)
 " Cardiology Office Note:  .   Date:  06/13/2024  ID:  Donna [name], DOB 1950-09-15, MRN [id number] PCP: Jordan, Betty G, MD  [facility] Providers Cardiologist:  Maude Emmer, MD    Patient Profile: .      PMH Hypertension Coronary artery disease CT Calcium  score 05/06/2022 CAC score 50.3 (59th percentile) LM 0, LAD 48.6, LCx 1.68, RCA 0 Coronary CTA 08/07/2022 CAC score 64 (66th percentile) TPV 129 (25th-50th percentile) Minimal non-obstructive CAD (1-24%) Aortic atherosclerosis Mild aortic root dilatation Essential tremor Hyperlipidemia Statin myalgia Former alcohol abuse Sober since 2017 Former tobacco abuse  Initially seen by Dr. me in 2014 for atypical chest pain.  She had normal ECG and normal nuclear study with no infarct or ischemia, EF 57%.  She has a history of EtOH abuse, sober since 2017, treated with Inderal  for withdrawal.  History of noncompliance with medication.  Seen by neurology 08/2018 for essential tremor to r/o Parkinson's.  There is a family history of tremor, mostly affects left arm.  ABIs 11/15/2018 no signs of PVD.  Myoview  08/31/2019, normal, EF 59%.  Echo 09/11/2020 with EF 55 to 60%, mild MR, mild AR, trileaflet valve, aortic root dilatation 4.0 cm.  She sees Dr. Chalice for sleep issues.  CT calcium  score 05/06/2022 with CAC score of 50.3, 59th percentile.    She was seen by NP on 07/24/2022 for increased shortness of breath walking around her neighborhood.  She also had some episodes of midsternal chest pain that concerned her.  Also noted pitting edema in her right ankle. Generally walks 15 to 30 minutes daily with hills, noting it more difficult in the afternoon than in the mornings. She admitted to needing to cut back on adding sea salt to her food.  History of asthma and working with pulmonology on medication management.  Due to her symptoms, we pursued coronary CTA  08/07/22 which revealed nonobstructive coronary disease.  She was advised to start  rosuvastatin  20 mg daily and return for repeat lipid testing in 2 to 3 months.  Unfortunately, she had myalgia with rosuvastatin  and she was switched to pravastatin .Statins now listed in her allergies   Had an emergency appendectomy on 05/11/23 with Dr Sebastian Solo to have abdominal pain after appendectomy MRI abdomen is negative ? IBS     ***   ROS: See HPI       Studies Reviewed: SABRA       Coronary CTA 08/06/22  IMPRESSION: 1. Coronary calcium  score of 64. This was 66th percentile for age, sex, and race matched control.   2. Total plaque volume of 129. This is between the 25th and 50th percentile for age and gender matched control. There is low attenuation plaque.   3.  Normal coronary origin with right dominance.   4. CAD-RADS 1. Minimal non-obstructive CAD (1-24%). Consider non-atherosclerotic causes of chest pain. Consider preventive therapy and risk factor modification.  Echocardiogram:  10/08/21  1. Left ventricular ejection fraction, by estimation, is 60 to 65%. The  left ventricle has normal function. The left ventricle has no regional  wall motion abnormalities. There is moderate asymmetric left ventricular  hypertrophy of the basal-septal  segment. Left ventricular diastolic parameters are indeterminate.   2. Right ventricular systolic function is normal. The right ventricular  size is normal. There is normal pulmonary artery systolic pressure. The  estimated right ventricular systolic pressure is 21.1 mmHg.   3. Left atrial size was severely dilated.   4. The  mitral valve is normal in structure. Trivial mitral valve  regurgitation. No evidence of mitral stenosis.   5. The aortic valve is tricuspid. Aortic valve regurgitation is trivial.  No aortic stenosis is present.   6. Aortic dilatation noted. There is dilatation of the ascending aorta,  measuring 40 mm.   7. The inferior vena cava is normal in size with greater than 50%  respiratory variability,  suggesting right atrial pressure of 3 mmHg       Physical Exam:   VS:  There were no vitals taken for this visit.   Wt Readings from Last 3 Encounters:  05/23/24 153 lb 3.2 oz (69.5 kg)  03/31/24 158 lb 1.6 oz (71.7 kg)  01/20/24 155 lb 8 oz (70.5 kg)    Affect appropriate Healthy:  appears stated age HEENT: normal Neck supple with no adenopathy JVP normal no bruits no thyromegaly Lungs clear with no wheezing and good diaphragmatic motion Heart:  S1/S2 no murmur, no rub, gallop or click PMI normal Abdomen: benighn, BS positve, no tenderness, no AAA no bruit.  No HSM or HJR Appendectomy new  Distal pulses intact with no bruits No edema Neuro non-focal Skin warm and dry No muscular weakness     ASSESSMENT AND PLAN: .    CAD without angina/aortic atherosclerosis: Coronary CTA 08/06/2022 with CAC score of 64 (66 percentile), total plaque volume 129, minimal (1-24%) nonobstructive CAD, aortic atherosclerosis. She denies chest pain, dyspnea, or other symptoms concerning for angina.   Palpitations: Reports occasional palpitations she feels are stress/anxiety induced.  Has not tolerated beta blockers   Aortic dilatation: We reviewed TTE 10/08/21 which revealed mild ascending aortic dilatation at 40 mm. Coronary CTA 08/06/22 revealed aortic dimension 38 mm, Observe   Hyperlipidemia LDL goal < 70: Lipid panel completed 10/12/2022 with total cholesterol 218, triglycerides 38, HDL 83, and LDL 128.  We had a lengthy discussion about reason for goal LDL 70 or lower. She has tried multiple lipid-lowering therapies including red yeast rice, rosuvastatin , and pravastatin  and unfortunately had myalgias on each of these.  LpA is low 18.7. LDL particle number elevated 1173, LDL 135, HDL 82 TC 228 Refer to lipid clinic to consider nexlitol or PSK9  Essential tremor: Takes propranolol  as needed for tremor, usually when speaking in front of groups. As noted, we will add lopressor  as needed for palpitations.    Elevated BP reading: continue lisinopril  and DASH diet       ***  Disposition:1 year   Signed, Rosaline Bane, NP-C "

## 2024-06-14 LAB — B. BURGDORFI ANTIBODIES: B burgdorferi Ab IgG+IgM: <0.9 {index}

## 2024-06-15 ENCOUNTER — Other Ambulatory Visit: Payer: Self-pay

## 2024-06-15 ENCOUNTER — Ambulatory Visit: Admitting: Allergy & Immunology

## 2024-06-15 ENCOUNTER — Encounter: Payer: Self-pay | Admitting: Allergy & Immunology

## 2024-06-15 VITALS — BP 118/74 | HR 53 | Temp 98.0°F | Ht 67.0 in | Wt 156.5 lb

## 2024-06-15 DIAGNOSIS — K9041 Non-celiac gluten sensitivity: Secondary | ICD-10-CM

## 2024-06-15 DIAGNOSIS — J4530 Mild persistent asthma, uncomplicated: Secondary | ICD-10-CM

## 2024-06-15 DIAGNOSIS — J302 Other seasonal allergic rhinitis: Secondary | ICD-10-CM

## 2024-06-15 NOTE — Patient Instructions (Addendum)
 Asthma:  - Lung testing looked excellent today. - I do not think that you have COPD.  - Daily controller medication(s): NOTHING - Prior to physical activity: albuterol 2 puffs 10-15 minutes before physical activity. - Rescue medications: albuterol 4 puffs every 4-6 hours as needed - Changes during respiratory infections or worsening symptoms: Add on Symbicort  to 2 puffs twice daily for TWO WEEKS. - Asthma control goals:  * Full participation in all desired activities (may need albuterol before activity) * Albuterol use two time or less a week on average (not counting use with activity) * Cough interfering with sleep two time or less a month * Oral steroids no more than once a year * No hospitalizations  Chronic Rhinitis: - Testing at the last visit showed: trees, indoor molds, outdoor molds, and cat - CPT codes of the shots provided today. - Call us  when you make a decision.  - Continue taking: Zyrtec (cetirizine) 10mg  tablet once daily - You can use an extra dose of the antihistamine, if needed, for breakthrough symptoms.  - Consider nasal saline rinses 1-2 times daily to remove allergens from the nasal cavities as well as help with mucous clearance (this is especially helpful to do before the nasal sprays are given) - Strongly consider allergy  shots as a means of long-term control. - Allergy  shots re-train and reset the immune system to ignore environmental allergens and decrease the resulting immune response to those allergens (sneezing, itchy watery eyes, runny nose, nasal congestion, etc).    - Allergy  shots improve symptoms in 75-85% of patients.  - We can discuss more at the next appointment if the medications are not working for you.  Gluten intolerance: - Continue to avoid gluten. - For mild symptoms you can take over the counter antihistamines such as Benadryl  and monitor symptoms closely.  - If symptoms worsen or if you have severe symptoms including breathing issues,  throat closure, significant swelling, whole body hives, severe diarrhea and vomiting, lightheadedness then seek immediate medical care.  2. Return in about 6 months (around 12/13/2024). You can have the follow up appointment with Dr. Iva or a Nurse Practicioner (our Nurse Practitioners are excellent and always have Physician oversight!).    Please inform us  of any Emergency Department visits, hospitalizations, or changes in symptoms. Call us  before going to the ED for breathing or allergy  symptoms since we might be able to fit you in for a sick visit. Feel free to contact us  anytime with any questions, problems, or concerns.  It was a pleasure to see you again today!  Websites that have reliable patient information: 1. American Academy of Asthma, Allergy , and Immunology: www.aaaai.org 2. Food Allergy  Research and Education (FARE): foodallergy.org 3. Mothers of Asthmatics: http://www.asthmacommunitynetwork.org 4. American College of Allergy , Asthma, and Immunology: www.acaai.org      Like us  on [employer] and Instagram for our latest updates!      A healthy democracy works best when [employer] participate! Make sure you are registered to vote! If you have moved or changed any of your contact information, you will need to get this updated before voting! Scan the QR codes below to learn more!            Allergy  Shots  Allergies are the result of a chain reaction that starts in the immune system. Your immune system controls how your body defends itself. For instance, if you have an allergy  to pollen, your immune system identifies pollen as an invader or allergen. Your  immune system overreacts by producing antibodies called Immunoglobulin E (IgE). These antibodies travel to cells that release chemicals, causing an allergic reaction.  The concept behind allergy  immunotherapy, whether it is received in the form of shots or tablets, is that the immune system can be desensitized to  specific allergens that trigger allergy  symptoms. Although it requires time and patience, the payback can be long-term relief. Allergy  injections contain a dilute solution of those substances that you are allergic to based upon your skin testing and allergy  history.   How Do Allergy  Shots Work?  Allergy  shots work much like a vaccine. Your body responds to injected amounts of a particular allergen given in increasing doses, eventually developing a resistance and tolerance to it. Allergy  shots can lead to decreased, minimal or no allergy  symptoms.  There generally are two phases: build-up and maintenance. Build-up often ranges from three to six months and involves receiving injections with increasing amounts of the allergens. The shots are typically given once or twice a week, though more rapid build-up schedules are sometimes used.  The maintenance phase begins when the most effective dose is reached. This dose is different for each person, depending on how allergic you are and your response to the build-up injections. Once the maintenance dose is reached, there are longer periods between injections, typically two to four weeks.  Occasionally doctors give cortisone-type shots that can temporarily reduce allergy  symptoms. These types of shots are different and should not be confused with allergy  immunotherapy shots.  Who Can Be Treated with Allergy  Shots?  Allergy  shots may be a good treatment approach for people with allergic rhinitis (hay fever), allergic asthma, conjunctivitis (eye allergy ) or stinging insect allergy .   Before deciding to begin allergy  shots, you should consider:   The length of allergy  season and the severity of your symptoms  Whether medications and/or changes to your environment can control your symptoms  Your desire to avoid long-term medication use  Time: allergy  immunotherapy requires a major time commitment  Cost: may vary depending on your insurance  coverage  Allergy  shots for children age 68 and older are effective and often well tolerated. They might prevent the onset of new allergen sensitivities or the progression to asthma.  Allergy  shots are not started on patients who are pregnant but can be continued on patients who become pregnant while receiving them. In some patients with other medical conditions or who take certain common medications, allergy  shots may be of risk. It is important to mention other medications you talk to your allergist.   What are the two types of build-ups offered:   RUSH or Rapid Desensitization -- one day of injections lasting from 8:30-4:30pm, injections every 1 hour.  Approximately half of the build-up process is completed in that one day.  The following week, normal build-up is resumed, and this entails ~16 visits either weekly or twice weekly, until reaching your maintenance dose which is continued weekly until eventually getting spaced out to every month for a duration of 3 to 5 years. The regular build-up appointments are nurse visits where the injections are administered, followed by required monitoring for 30 minutes.    Traditional build-up -- weekly visits for 6 -12 months until reaching maintenance dose, then continue weekly until eventually spacing out to every 4 weeks as above. At these appointments, the injections are administered, followed by required monitoring for 30 minutes.     Either way is acceptable, and both are equally effective. With the rush protocol,  the advantage is that less time is spent here for injections overall AND you would also reach maintenance dosing faster (which is when the clinical benefit starts to become more apparent). Not everyone is a candidate for rapid desensitization.   IF we proceed with the RUSH protocol, there are premedications which must be taken the day before and the day after the rush only (this includes antihistamines, steroids, and Singulair ).  After  the rush day, no prednisone or Singulair  is required, and we just recommend antihistamines taken on your injection day.  What Is An Estimate of the Costs?  If you are interested in starting allergy  injections, please check with your insurance company about your coverage for both allergy  vial sets and allergy  injections.  Please do so prior to making the appointment to start injections.  The following are CPT codes to give to your insurance company. These are the amounts we BILL to the insurance company, but the amount YOU WILL PAY and WE RECEIVE IS SUBSTANTIALLY LESS and depends on the contracts we have with different insurance companies.   Amount Billed to Insurance Two allergy  vial set  CPT 95165   $ 2400  Two injections   CPT 95117   $ 40  Regarding the allergy  injections, your co-pay may or may not apply with each injection, so please confirm this with your insurance company. When you start allergy  injections, 1 or 2 sets of vials are made based on your allergies.  Not all patients can be on one set of vials. A set of vials lasts 6 months to a year depending on how quickly you can proceed with your build-up of your allergy  injections. Vials are personalized for each patient depending on their specific allergens.  How often are allergy  injection given during the build-up period?   Injections are given at least weekly during the build-up period until your maintenance dose is achieved. Per the doctor's discretion, you may have the option of getting allergy  injections two times per week during the build-up period. However, there must be at least 48 hours between injections. The build-up period is usually completed within 6-12 months depending on your ability to schedule injections and for adjustments for reactions. When maintenance dose is reached, your injection schedule is gradually changed to every two weeks and later to every three weeks. Injections will then continue every 4 weeks. Usually,  injections are continued for a total of 3-5 years.   When Will I Feel Better?  Some may experience decreased allergy  symptoms during the build-up phase. For others, it may take as long as 12 months on the maintenance dose. If there is no improvement after a year of maintenance, your allergist will discuss other treatment options with you.  If you arent responding to allergy  shots, it may be because there is not enough dose of the allergen in your vaccine or there are missing allergens that were not identified during your allergy  testing. Other reasons could be that there are high levels of the allergen in your environment or major exposure to non-allergic triggers like tobacco smoke.  What Is the Length of Treatment?  Once the maintenance dose is reached, allergy  shots are generally continued for three to five years. The decision to stop should be discussed with your allergist at that time. Some people may experience a permanent reduction of allergy  symptoms. Others may relapse and a longer course of allergy  shots can be considered.  What Are the Possible Reactions?  The two types of  adverse reactions that can occur with allergy  shots are local and systemic. Common local reactions include very mild redness and swelling at the injection site, which can happen immediately or several hours after. Report a delayed reaction from your last injection. These include arm swelling or runny nose, watery eyes or cough that occurs within 12-24 hours after injection. A systemic reaction, which is less common, affects the entire body or a particular body system. They are usually mild and typically respond quickly to medications. Signs include increased allergy  symptoms such as sneezing, a stuffy nose or hives.   Rarely, a serious systemic reaction called anaphylaxis can develop. Symptoms include swelling in the throat, wheezing, a feeling of tightness in the chest, nausea or dizziness. Most serious systemic  reactions develop within 30 minutes of allergy  shots. This is why it is strongly recommended you wait in your doctors office for 30 minutes after your injections. Your allergist is trained to watch for reactions, and his or her staff is trained and equipped with the proper medications to identify and treat them.   Report to the nurse immediately if you experience any of the following symptoms: swelling, itching or redness of the skin, hives, watery eyes/nose, breathing difficulty, excessive sneezing, coughing, stomach pain, diarrhea, or light headedness. These symptoms may occur within 15-20 minutes after injection and may require medication.   Who Should Administer Allergy  Shots?  The preferred location for receiving shots is your prescribing allergists office. Injections can sometimes be given at another facility where the physician and staff are trained to recognize and treat reactions, and have received instructions by your prescribing allergist.  What if I am late for an injection?   Injection dose will be adjusted depending upon how many days or weeks you are late for your injection.   What if I am sick?   Please report any illness to the nurse before receiving injections. She may adjust your dose or postpone injections depending on your symptoms. If you have fever, flu, sinus infection or chest congestion it is best to postpone allergy  injections until you are better. Never get an allergy  injection if your asthma is causing you problems. If your symptoms persist, seek out medical care to get your health problem under control.  What If I am or Become Pregnant:  Women that become pregnant should schedule an appointment with [facility] before receiving any further allergy  injections.

## 2024-06-15 NOTE — Progress Notes (Unsigned)
 "  FOLLOW UP  Date of Service/Encounter:  06/15/24   Assessment:   Mild persistent asthma, uncomplicated   Perennial and seasonal allergic rhinitis (trees, indoor molds, outdoor molds, and cat) - interested in starting allergy  shots   Gluten intolerance   Essential tremors - still controlled despite stopping propanolol  Plan/Recommendations:   Asthma:  - Lung testing looked excellent today. - I do not think that you have COPD.  - Daily controller medication(s): NOTHING - Prior to physical activity: albuterol 2 puffs 10-15 minutes before physical activity. - Rescue medications: albuterol 4 puffs every 4-6 hours as needed - Changes during respiratory infections or worsening symptoms: Add on Symbicort  to 2 puffs twice daily for TWO WEEKS. - Asthma control goals:  * Full participation in all desired activities (may need albuterol before activity) * Albuterol use two time or less a week on average (not counting use with activity) * Cough interfering with sleep two time or less a month * Oral steroids no more than once a year * No hospitalizations  Chronic Rhinitis: - Testing at the last visit showed: trees, indoor molds, outdoor molds, and cat - CPT codes of the shots provided today. - Call us  when you make a decision.  - Continue taking: Zyrtec (cetirizine) 10mg  tablet once daily - You can use an extra dose of the antihistamine, if needed, for breakthrough symptoms.  - Consider nasal saline rinses 1-2 times daily to remove allergens from the nasal cavities as well as help with mucous clearance (this is especially helpful to do before the nasal sprays are given) - Strongly consider allergy  shots as a means of long-term control. - Allergy  shots re-train and reset the immune system to ignore environmental allergens and decrease the resulting immune response to those allergens (sneezing, itchy watery eyes, runny nose, nasal congestion, etc).    - Allergy  shots improve symptoms in  75-85% of patients.  - We can discuss more at the next appointment if the medications are not working for you.  Gluten intolerance: - Continue to avoid gluten. - For mild symptoms you can take over the counter antihistamines such as Benadryl  and monitor symptoms closely.  - If symptoms worsen or if you have severe symptoms including breathing issues, throat closure, significant swelling, whole body hives, severe diarrhea and vomiting, lightheadedness then seek immediate medical care.  2. Return in about 6 months (around 12/13/2024). You can have the follow up appointment with Dr. Iva or a Nurse Practicioner (our Nurse Practitioners are excellent and always have Physician oversight!).   Subjective:   Donna [name] is a 74 y.o. female presenting today for follow up of  Chief Complaint  Patient presents with   Asthma    Reactive some with season change. Having to use inhalers more often but can't put a number on how many times a week.    Follow-up    Donna [name] has a history of the following: Patient Active Problem List   Diagnosis Date Noted   Family history of colon cancer 01/20/2024   Exposure to hepatitis B 07/01/2023   Need for immunization against viral hepatitis 07/01/2023   Screening for STDs (sexually transmitted diseases) 07/01/2023   History of substance use 07/01/2023   S/P appendectomy 05/11/2023   S/P laparoscopic appendectomy 05/11/2023   Gasping for breath 08/19/2021   PTSD (post-traumatic stress disorder) 08/19/2021   Parasomnia overlap disorder 08/19/2021   Snoring 08/19/2021   Excessive daytime sleepiness 08/19/2021   Sleep paralysis, recurrent isolated  08/19/2021   Asthma 02/19/2021   Multiple drug allergies 02/19/2021   Gluten intolerance 02/19/2021   Chronic rhinitis 02/19/2021   Metatarsalgia of left foot 11/14/2020   Achilles tendinosis of right lower extremity 11/14/2020   Essential tremor 07/04/2019   DOE (dyspnea on exertion) 01/20/2019    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 06/13/2018   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   Superficial basal cell carcinoma 06/13/2018   Upper airway cough syndrome 06/13/2018   Atrophic vaginitis 02/17/2018   Bilateral leg cramps 02/17/2018   Fatty liver, alcoholic 04/22/2016   Depression 04/19/2016   Withdrawal complaint 04/19/2016   Hx of adenomatous colonic polyps 08/03/2015   Herpes simplex 06/15/2014   GERD (gastroesophageal reflux disease) 10/14/2013   Anxiety state 10/14/2013   Alcohol abuse, in remission 10/14/2013   Allergic rhinitis 10/14/2013   Adjustment disorder with anxiety 10/14/2013   Elevated low-density lipoprotein level 10/14/2013   Essential hypertension 10/15/2012   Heart murmur 05/25/2012    History obtained from: chart review and patient.  Discussed the use of AI scribe software for clinical note transcription with the patient and/or guardian, who gave verbal consent to proceed.  Donna [name] is a 74 y.o. female presenting for a follow up visit.  She was last seen in June 2025.  At that time, her lung testing looked good.  We continue with albuterol as needed.  For her rhinitis, she had testing in the past that was positive to tree, indoor mold, outdoor mold, and cat.  We stopped Claritin and started Zyrtec 10 mg daily.  For her gluten intolerance, she continued to avoid it.  Since last visit, she has done relatively well.  Asthma/Respiratory Symptom History: She has been managing her asthma with Symbicort  as needed. Her last exacerbation was in April of the previous year during a respiratory virus episode, which required antibiotics but not hospitalization. She has a history of smoking for thirty years but has since quit. She inquires about the difference between asthma and COPD, given her smoking history.   Allergic Rhinitis Symptom History: She is interested in allergy  shots for her allergies to mold, trees, cockroaches,  and pets. Her allergies are manageable unless triggered by specific allergens. Currently, she is not on any daily allergy  medications like Zyrtec or Claritin. She has had some over-drying from these antihistamines.   Infection Symptom History: She completed five rounds of antibiotics over three months, leading to gastrointestinal discomfort. Her last antibiotic course was in July for a urinary tract infection and a cat bite. She typically does not use antibiotics at all, so 2025 was unusual for her.  She expresses dissatisfaction with her previous primary care provider due to issues with office staff and follow-through on necessary tests, prompting her to seek care elsewhere.   Otherwise, there have been no changes to her past medical history, surgical history, family history, or social history.    Review of systems otherwise negative other than that mentioned in the HPI.    Objective:   Blood pressure 118/74, pulse (!) 53, temperature 98 F (36.7 C), height 5' 7 (1.702 m), weight 156 lb 8 oz (71 kg), SpO2 98%. Body mass index is 24.51 kg/m.    Physical Exam Vitals reviewed.  Constitutional:      Appearance: Normal appearance. She is well-developed.     Comments: Very friendly.  HENT:     Head: Normocephalic and atraumatic.     Right Ear: Tympanic membrane, ear  canal and external ear normal.     Left Ear: Tympanic membrane, ear canal and external ear normal.     Nose: No nasal deformity, septal deviation, mucosal edema or rhinorrhea.     Right Turbinates: Enlarged, swollen and pale.     Left Turbinates: Enlarged, swollen and pale.     Right Sinus: No maxillary sinus tenderness or frontal sinus tenderness.     Left Sinus: No maxillary sinus tenderness or frontal sinus tenderness.     Comments: No polyps noted.     Mouth/Throat:     Lips: Pink.     Mouth: Mucous membranes are moist. Mucous membranes are not pale and not dry.     Pharynx: Uvula midline.     Comments: Minimal  cobblestoning.  Eyes:     General: Lids are normal. No allergic shiner.       Right eye: No discharge.        Left eye: No discharge.     Conjunctiva/sclera: Conjunctivae normal.     Right eye: Right conjunctiva is not injected. No chemosis.    Left eye: Left conjunctiva is not injected. No chemosis.    Pupils: Pupils are equal, round, and reactive to light.  Cardiovascular:     Rate and Rhythm: Normal rate and regular rhythm.     Heart sounds: Normal heart sounds.  Pulmonary:     Effort: Pulmonary effort is normal. No tachypnea, accessory muscle usage or respiratory distress.     Breath sounds: Normal breath sounds. No wheezing, rhonchi or rales.     Comments: Moving air well in all lung fields.  No increased work of breathing.  Making full sentences.  Chest:     Chest wall: No tenderness.  Lymphadenopathy:     Cervical: No cervical adenopathy.  Skin:    Coloration: Skin is not pale.     Findings: No abrasion, erythema, petechiae or rash. Rash is not papular, urticarial or vesicular.  Neurological:     Mental Status: She is alert.  Psychiatric:        Behavior: Behavior is cooperative.      Diagnostic studies:    Spirometry: results normal (FEV1: 1.26/78%, FVC: 1.86/91%, FEV1/FVC: 68%).    Spirometry consistent with normal pattern.   Allergy  Studies: none      Marty Shaggy, MD  [facility]        "

## 2024-06-16 ENCOUNTER — Ambulatory Visit: Payer: Self-pay | Admitting: Family Medicine

## 2024-06-16 ENCOUNTER — Encounter: Payer: Self-pay | Admitting: Allergy & Immunology

## 2024-06-16 MED ORDER — EPINEPHRINE 0.3 MG/0.3ML IJ SOAJ: 0.3000 mg | INTRAMUSCULAR | 1 refills | Status: AC | PRN

## 2024-06-26 ENCOUNTER — Ambulatory Visit: Admitting: Cardiovascular Disease

## 2024-06-28 ENCOUNTER — Ambulatory Visit: Admitting: Gastroenterology

## 2024-07-05 ENCOUNTER — Ambulatory Visit: Admitting: Dermatology

## 2024-07-10 ENCOUNTER — Ambulatory Visit

## 2024-08-21 ENCOUNTER — Ambulatory Visit: Admitting: Family Medicine

## 2024-12-12 ENCOUNTER — Ambulatory Visit: Admitting: Allergy & Immunology
